# Patient Record
Sex: Male | Born: 1970 | Race: White | Hispanic: No | Smoking: Former smoker
Health system: Southern US, Community
[De-identification: ages and names within clinical notes are randomized; demographics above are authoritative.]

## PROBLEM LIST (undated history)

## (undated) DIAGNOSIS — D803 Selective deficiency of immunoglobulin G [IgG] subclasses: Secondary | ICD-10-CM

## (undated) DIAGNOSIS — I519 Heart disease, unspecified: Secondary | ICD-10-CM

## (undated) DIAGNOSIS — E785 Hyperlipidemia, unspecified: Secondary | ICD-10-CM

## (undated) DIAGNOSIS — F250 Schizoaffective disorder, bipolar type: Secondary | ICD-10-CM

## (undated) DIAGNOSIS — K2210 Ulcer of esophagus without bleeding: Secondary | ICD-10-CM

## (undated) DIAGNOSIS — R918 Other nonspecific abnormal finding of lung field: Secondary | ICD-10-CM

## (undated) DIAGNOSIS — K5792 Diverticulitis of intestine, part unspecified, without perforation or abscess without bleeding: Secondary | ICD-10-CM

## (undated) DIAGNOSIS — J449 Chronic obstructive pulmonary disease, unspecified: Secondary | ICD-10-CM

## (undated) DIAGNOSIS — R011 Cardiac murmur, unspecified: Secondary | ICD-10-CM

## (undated) DIAGNOSIS — F319 Bipolar disorder, unspecified: Secondary | ICD-10-CM

## (undated) DIAGNOSIS — I669 Occlusion and stenosis of unspecified cerebral artery: Secondary | ICD-10-CM

## (undated) DIAGNOSIS — H9190 Unspecified hearing loss, unspecified ear: Secondary | ICD-10-CM

## (undated) DIAGNOSIS — M1990 Unspecified osteoarthritis, unspecified site: Secondary | ICD-10-CM

## (undated) DIAGNOSIS — K219 Gastro-esophageal reflux disease without esophagitis: Secondary | ICD-10-CM

## (undated) DIAGNOSIS — R1110 Vomiting, unspecified: Secondary | ICD-10-CM

## (undated) DIAGNOSIS — I10 Essential (primary) hypertension: Secondary | ICD-10-CM

## (undated) DIAGNOSIS — K449 Diaphragmatic hernia without obstruction or gangrene: Secondary | ICD-10-CM

## (undated) DIAGNOSIS — Z8489 Family history of other specified conditions: Secondary | ICD-10-CM

## (undated) DIAGNOSIS — M549 Dorsalgia, unspecified: Secondary | ICD-10-CM

## (undated) DIAGNOSIS — D649 Anemia, unspecified: Secondary | ICD-10-CM

## (undated) HISTORY — DX: Gastro-esophageal reflux disease without esophagitis: K21.9

## (undated) HISTORY — DX: Essential (primary) hypertension: I10

## (undated) HISTORY — DX: Hyperlipidemia, unspecified: E78.5

## (undated) HISTORY — DX: Bipolar disorder, unspecified: F31.9

## (undated) HISTORY — DX: Heart disease, unspecified: I51.9

## (undated) HISTORY — DX: Anemia, unspecified: D64.9

## (undated) HISTORY — DX: Chronic obstructive pulmonary disease, unspecified: J44.9

## (undated) HISTORY — DX: Selective deficiency of immunoglobulin g (igg) subclasses: D80.3

## (undated) HISTORY — DX: Other nonspecific abnormal finding of lung field: R91.8

## (undated) HISTORY — DX: Schizoaffective disorder, bipolar type: F25.0

---

## 2003-11-09 HISTORY — PX: BRAIN SURGERY: SHX531

## 2013-08-07 ENCOUNTER — Encounter (HOSPITAL_COMMUNITY): Payer: Self-pay | Admitting: *Deleted

## 2013-08-07 ENCOUNTER — Emergency Department (HOSPITAL_COMMUNITY)
Admission: EM | Admit: 2013-08-07 | Discharge: 2013-08-07 | Disposition: A | Discharge status: Home / Self Care | Payer: Self-pay | Attending: Emergency Medicine | Admitting: Emergency Medicine

## 2013-08-07 ENCOUNTER — Emergency Department (HOSPITAL_COMMUNITY): Payer: Self-pay

## 2013-08-07 DIAGNOSIS — IMO0001 Reserved for inherently not codable concepts without codable children: Secondary | ICD-10-CM | POA: Insufficient documentation

## 2013-08-07 DIAGNOSIS — M542 Cervicalgia: Secondary | ICD-10-CM | POA: Insufficient documentation

## 2013-08-07 DIAGNOSIS — R202 Paresthesia of skin: Secondary | ICD-10-CM

## 2013-08-07 DIAGNOSIS — Z79899 Other long term (current) drug therapy: Secondary | ICD-10-CM | POA: Insufficient documentation

## 2013-08-07 DIAGNOSIS — R209 Unspecified disturbances of skin sensation: Secondary | ICD-10-CM | POA: Insufficient documentation

## 2013-08-07 DIAGNOSIS — R1110 Vomiting, unspecified: Secondary | ICD-10-CM | POA: Insufficient documentation

## 2013-08-07 DIAGNOSIS — Z8739 Personal history of other diseases of the musculoskeletal system and connective tissue: Secondary | ICD-10-CM | POA: Insufficient documentation

## 2013-08-07 HISTORY — DX: Unspecified osteoarthritis, unspecified site: M19.90

## 2013-08-07 LAB — CBC WITH DIFFERENTIAL/PLATELET
Basophils Relative: 1 % (ref 0–1)
HCT: 40.7 % (ref 39.0–52.0)
Hemoglobin: 13.5 g/dL (ref 13.0–17.0)
Lymphs Abs: 1.7 10*3/uL (ref 0.7–4.0)
MCH: 30 pg (ref 26.0–34.0)
MCHC: 33.2 g/dL (ref 30.0–36.0)
MCV: 90.4 fL (ref 78.0–100.0)
Monocytes Absolute: 0.5 10*3/uL (ref 0.1–1.0)
Monocytes Relative: 7 % (ref 3–12)
Neutro Abs: 5.1 10*3/uL (ref 1.7–7.7)

## 2013-08-07 LAB — BASIC METABOLIC PANEL
BUN: 13 mg/dL (ref 6–23)
Calcium: 9.7 mg/dL (ref 8.4–10.5)
Chloride: 100 mEq/L (ref 96–112)
Creatinine, Ser: 0.95 mg/dL (ref 0.50–1.35)
GFR calc Af Amer: >90 mL/min (ref >90)
Sodium: 137 mEq/L (ref 135–145)

## 2013-08-07 LAB — TROPONIN I: Troponin I: <0.3 ng/mL (ref <0.30)

## 2013-08-07 MED ORDER — OXYCODONE-ACETAMINOPHEN 5-325 MG PO TABS
1.0000 | ORAL_TABLET | Freq: Once | ORAL | Status: AC
  Administered 2013-08-07: 1 via ORAL
  Filled 2013-08-07: qty 1

## 2013-08-07 MED ORDER — HYDROCODONE-ACETAMINOPHEN 5-325 MG PO TABS: 1.0000 | ORAL_TABLET | ORAL | Status: DC | PRN

## 2013-08-07 MED ORDER — ESOMEPRAZOLE MAGNESIUM 40 MG PO CPDR: 40.0000 mg | DELAYED_RELEASE_CAPSULE | Freq: Every day | ORAL | Status: DC

## 2013-08-07 NOTE — ED Notes (Signed)
Pain lt arm tingling , pain.  X 4 days.arthritis in neck.  No known injury

## 2013-08-07 NOTE — ED Provider Notes (Signed)
CSN: [id number]     Arrival date & time 08/07/13  1247 History   This chart was scribed for Joya Gaskins, MD, by Yevette Edwards, ED Scribe. This patient was seen in room APA07/APA07 and the patient's care was started at 2:54 PM.  First MD Initiated Contact with Patient 08/07/13 1435     Chief Complaint  Patient presents with  . Arm Pain    The history is provided by the patient. No language interpreter was used.   HPI Comments: Shawn [name] is a 42 y.o. male, with a h/o arthritis, who presents to the Emergency Department complaining of left arm pain which radiates down his arm into two fingers and is associated with numbness and paresthesia. This started about 4 days ago.  No trauma reported  He also denies any decreased strength to his hand or fingers and he denies any cyanosis of fingers.  The pt has a h/o neck pain, and he reports gradually-increasing neck pain. He denies any numbness to his legs. He denies any chest pain or SOB. The pt also reports that he has experienced intermittent episodes of emesis for at least a year.   Past Medical History  Diagnosis Date  . Arthritis    History reviewed. No pertinent past surgical history. History reviewed. No pertinent family history. History  Substance Use Topics  . Smoking status: Never Smoker   . Smokeless tobacco: Current User    Types: Snuff  . Alcohol Use: Yes    Review of Systems  HENT: Positive for neck pain (Increased from baseline. ).   Respiratory: Negative for shortness of breath.   Cardiovascular: Negative for chest pain.  Gastrointestinal: Positive for vomiting.  Musculoskeletal: Positive for myalgias.  Neurological: Positive for numbness. Negative for weakness.  All other systems reviewed and are negative.    Allergies  Review of patient's allergies indicates no known allergies.  Home Medications   Current Outpatient Rx  Name  Route  Sig  Dispense  Refill  . carbamazepine (TEGRETOL) 200 MG tablet    Oral   Take 200 mg by mouth 2 (two) times daily.         . citalopram (CELEXA) 20 MG tablet   Oral   Take 20 mg by mouth daily.         Marland Kitchen esomeprazole (NEXIUM) 40 MG capsule   Oral   Take 40 mg by mouth daily before breakfast.         . HYDROcodone-acetaminophen (NORCO) 7.5-325 MG per tablet   Oral   Take 1 tablet by mouth 2 (two) times daily as needed for pain.         Marland Kitchen lurasidone (LATUDA) 40 MG TABS tablet   Oral   Take 40 mg by mouth 2 (two) times daily.         . Multiple Vitamins-Minerals (MULTIVITAMINS THER. W/MINERALS) TABS tablet   Oral   Take 1 tablet by mouth daily.         Marland Kitchen RASPBERRY KETONES PO   Oral   Take 1 capsule by mouth 2 (two) times daily.         Marland Kitchen GABAPENTIN PO   Oral   Take 1 capsule by mouth at bedtime as needed (sleep/ muscle relaxation).          Triage Vitals: BP 138/93  Pulse 80  Temp(Src) 98.4 F (36.9 C) (Oral)  Resp 18  Ht 6' (1.829 m)  Wt 190 lb (86.183 kg)  BMI 25.76 kg/m2  SpO2 100%  Physical Exam CONSTITUTIONAL: Well developed/well nourished HEAD: Normocephalic/atraumatic EYES: EOMI/PERRL ENMT: Mucous membranes moist NECK: supple no meningeal signs SPINE:entire spine non-tender; mild cervical para-spinal tenderness.  CV: S1/S2 noted, no murmurs/rubs/gallops noted LUNGS: Lungs are clear to auscultation bilaterally, no apparent distress ABDOMEN: soft, nontender, no rebound or guarding GU:no cva tenderness NEURO: Pt is awake/alert, moves all extremitiesx4 ;no arm drift; no facial droop, Equal power with hand grip, wrist flex/extension, elbow flex/extension, and equal power with shoulder abduction/adduction.  No focal sensory deficit is noted in either UE.   Equal biceps/brachioradial reflex in bilateral UE EXTREMITIES: pulses normal, full ROM SKIN: warm, color normal PSYCH: no abnormalities of mood noted  ED Course  Procedures   DIAGNOSTIC STUDIES: Oxygen Saturation is 100% on room air, normal by my  interpretation.    COORDINATION OF CARE:  3:00 PM- Discussed treatment plan with patient, and the patient agreed to the plan.  Doubt ACS Doubt CVA Suspect possible cervical radiculopathy He is new to area, short course of pain meds (and he requested nexium refill) ordered for patient Stable for d/c home  Labs Review Labs Reviewed  CBC WITH DIFFERENTIAL  BASIC METABOLIC PANEL  TROPONIN I      Imaging Review Dg Chest 2 View  08/07/2013   CLINICAL DATA:  Left arm pain/numbness  EXAM: CHEST  2 VIEW  COMPARISON:  None.  FINDINGS: Lungs are clear. No pleural effusion or pneumothorax.  The heart is normal in size.  Mild degenerative changes of the mid thoracic spine.  IMPRESSION: No evidence of acute cardiopulmonary disease.   Electronically Signed   By: Charline Bills M.D.   On: 08/07/2013 13:25    MDM  No diagnosis found. Nursing notes including past medical history and social history reviewed and considered in documentation xrays reviewed and considered Labs/vital reviewed and considered     Date: 08/07/2013  Rate: 74  Rhythm: normal sinus rhythm  QRS Axis: normal  Intervals: normal  ST/T Wave abnormalities: nonspecific ST changes  Conduction Disutrbances:none  Narrative Interpretation: LVH noted  Old EKG Reviewed: none available at time of interpretation    I personally performed the services described in this documentation, which was scribed in my presence. The recorded information has been reviewed and is accurate.      Joya Gaskins, MD 08/07/13 2221

## 2013-08-17 ENCOUNTER — Encounter (HOSPITAL_COMMUNITY): Payer: Self-pay | Admitting: Emergency Medicine

## 2013-08-17 ENCOUNTER — Emergency Department (HOSPITAL_COMMUNITY)
Admission: EM | Admit: 2013-08-17 | Discharge: 2013-08-17 | Disposition: A | Discharge status: Home / Self Care | Payer: Self-pay | Attending: Emergency Medicine | Admitting: Emergency Medicine

## 2013-08-17 DIAGNOSIS — Z4802 Encounter for removal of sutures: Secondary | ICD-10-CM

## 2013-08-17 DIAGNOSIS — Z8739 Personal history of other diseases of the musculoskeletal system and connective tissue: Secondary | ICD-10-CM | POA: Insufficient documentation

## 2013-08-17 DIAGNOSIS — Z79899 Other long term (current) drug therapy: Secondary | ICD-10-CM | POA: Insufficient documentation

## 2013-08-17 DIAGNOSIS — Z6832 Body mass index (BMI) 32.0-32.9, adult: Secondary | ICD-10-CM | POA: Insufficient documentation

## 2013-08-17 NOTE — ED Notes (Signed)
Alert, NAD here for suture removal.

## 2013-08-17 NOTE — ED Notes (Signed)
Pt had sutures placed to groin area 2 weeks ago, removed several himself, unable to get them all out.  Caught his penis on toilet seat 2 weeks ago.

## 2013-08-17 NOTE — ED Provider Notes (Signed)
CSN: [id number]     Arrival date & time 08/17/13  1411 History   First MD Initiated Contact with Patient 08/17/13 1427     Chief Complaint  Patient presents with  . Suture / Staple Removal   (Consider location/radiation/quality/duration/timing/severity/associated sxs/prior Treatment) HPI Comments: Shawn [name] is a 42 y.o. Male presenting for suture removal.  He had a laceration injury to the base of his penis 10 days ago (in [place] where he used to live).  He was sitting on his toilet when the seat slid, causing injury and laceration.  He reports having 6 stitches originally, was able to remove 4 himself,  But almost passed out trying to remove the remaining 2 today.  He denies pain at the site,  No drainage, redness or other complaint.     The history is provided by the patient and the spouse.    Past Medical History  Diagnosis Date  . Arthritis    History reviewed. No pertinent past surgical history. No family history on file. History  Substance Use Topics  . Smoking status: Never Smoker   . Smokeless tobacco: Current User    Types: Snuff  . Alcohol Use: Yes    Review of Systems  Constitutional: Negative for fever and chills.  HENT: Negative for facial swelling.   Respiratory: Negative for shortness of breath and wheezing.   Skin: Positive for wound. Negative for color change.  Neurological: Negative for numbness.    Allergies  Review of patient's allergies indicates no known allergies.  Home Medications   Current Outpatient Rx  Name  Route  Sig  Dispense  Refill  . carbamazepine (TEGRETOL) 200 MG tablet   Oral   Take 200 mg by mouth 2 (two) times daily.         . citalopram (CELEXA) 20 MG tablet   Oral   Take 20 mg by mouth daily.         Marland Kitchen gabapentin (NEURONTIN) 400 MG capsule   Oral   Take 400 mg by mouth at bedtime.         Marland Kitchen lurasidone (LATUDA) 40 MG TABS tablet   Oral   Take 40 mg by mouth 2 (two) times daily.         . Multiple  Vitamins-Minerals (MULTIVITAMINS THER. W/MINERALS) TABS tablet   Oral   Take 1 tablet by mouth daily.         Marland Kitchen RASPBERRY KETONES PO   Oral   Take 1 capsule by mouth 2 (two) times daily.          BP 127/84  Pulse 89  Temp(Src) 98.8 F (37.1 C) (Oral)  Resp 18  Ht 6' (1.829 m)  Wt 187 lb (84.823 kg)  BMI 25.36 kg/m2  SpO2 99% Physical Exam  Constitutional: He is oriented to person, place, and time. He appears well-developed and well-nourished.  HENT:  Head: Normocephalic.  Cardiovascular: Normal rate.   Pulmonary/Chest: Effort normal.  Musculoskeletal: He exhibits no edema and no tenderness.  Neurological: He is alert and oriented to person, place, and time. No sensory deficit.  Skin: Laceration noted.  Well healed laceration dorsal base of penis,  2 sutures in place.  No drainage,  Redness, no swelling.    ED Course  Procedures (including critical care time)  Procedure - suture removal - #2 simple interrupted sutures removed which patient tolerated well.   Labs Review Labs Reviewed - No data to display Imaging Review No results found.  EKG Interpretation  None       MDM   1. Visit for suture removal    Prn f/u anticipated    Burgess Amor, PA-C 08/17/13 1455

## 2013-08-17 NOTE — ED Provider Notes (Signed)
Medical screening examination/treatment/procedure(s) were performed by non-physician practitioner and as supervising physician I was immediately available for consultation/collaboration.   Celene Kras, MD 08/17/13 [phone]

## 2013-09-29 ENCOUNTER — Emergency Department (HOSPITAL_COMMUNITY)
Admission: EM | Admit: 2013-09-29 | Discharge: 2013-09-29 | Disposition: A | Discharge status: Home / Self Care | Payer: Self-pay | Attending: Emergency Medicine | Admitting: Emergency Medicine

## 2013-09-29 ENCOUNTER — Emergency Department (HOSPITAL_COMMUNITY): Payer: Self-pay

## 2013-09-29 ENCOUNTER — Encounter (HOSPITAL_COMMUNITY): Payer: Self-pay | Admitting: Emergency Medicine

## 2013-09-29 DIAGNOSIS — F319 Bipolar disorder, unspecified: Secondary | ICD-10-CM | POA: Insufficient documentation

## 2013-09-29 DIAGNOSIS — R42 Dizziness and giddiness: Secondary | ICD-10-CM | POA: Insufficient documentation

## 2013-09-29 DIAGNOSIS — M129 Arthropathy, unspecified: Secondary | ICD-10-CM | POA: Insufficient documentation

## 2013-09-29 DIAGNOSIS — R21 Rash and other nonspecific skin eruption: Secondary | ICD-10-CM | POA: Insufficient documentation

## 2013-09-29 DIAGNOSIS — Z79899 Other long term (current) drug therapy: Secondary | ICD-10-CM | POA: Insufficient documentation

## 2013-09-29 DIAGNOSIS — M549 Dorsalgia, unspecified: Secondary | ICD-10-CM | POA: Insufficient documentation

## 2013-09-29 DIAGNOSIS — G8929 Other chronic pain: Secondary | ICD-10-CM | POA: Insufficient documentation

## 2013-09-29 DIAGNOSIS — M542 Cervicalgia: Secondary | ICD-10-CM | POA: Insufficient documentation

## 2013-09-29 DIAGNOSIS — K5732 Diverticulitis of large intestine without perforation or abscess without bleeding: Secondary | ICD-10-CM | POA: Insufficient documentation

## 2013-09-29 DIAGNOSIS — K5792 Diverticulitis of intestine, part unspecified, without perforation or abscess without bleeding: Secondary | ICD-10-CM

## 2013-09-29 DIAGNOSIS — H538 Other visual disturbances: Secondary | ICD-10-CM | POA: Insufficient documentation

## 2013-09-29 HISTORY — DX: Dorsalgia, unspecified: M54.9

## 2013-09-29 HISTORY — DX: Bipolar disorder, unspecified: F31.9

## 2013-09-29 LAB — COMPREHENSIVE METABOLIC PANEL
ALT: 24 U/L (ref 0–53)
Alkaline Phosphatase: 75 U/L (ref 39–117)
CO2: 27 mEq/L (ref 19–32)
Chloride: 97 mEq/L (ref 96–112)
Creatinine, Ser: 1.12 mg/dL (ref 0.50–1.35)
GFR calc Af Amer: >90 mL/min (ref >90)
GFR calc non Af Amer: 79 mL/min — ABNORMAL LOW (ref >90)
Glucose, Bld: 103 mg/dL — ABNORMAL HIGH (ref 70–99)
Potassium: 3.9 mEq/L (ref 3.5–5.1)
Sodium: 134 mEq/L — ABNORMAL LOW (ref 135–145)

## 2013-09-29 LAB — URINALYSIS, ROUTINE W REFLEX MICROSCOPIC
Hgb urine dipstick: NEGATIVE
Nitrite: NEGATIVE
Protein, ur: NEGATIVE mg/dL
Specific Gravity, Urine: >1.03 — ABNORMAL HIGH (ref 1.005–1.030)
Urobilinogen, UA: 0.2 mg/dL (ref 0.0–1.0)
pH: 6 (ref 5.0–8.0)

## 2013-09-29 LAB — CBC WITH DIFFERENTIAL/PLATELET
Basophils Absolute: 0.1 10*3/uL (ref 0.0–0.1)
Basophils Relative: 0 % (ref 0–1)
Eosinophils Absolute: 0.1 10*3/uL (ref 0.0–0.7)
Hemoglobin: 14.6 g/dL (ref 13.0–17.0)
MCHC: 33.7 g/dL (ref 30.0–36.0)
MCV: 89.1 fL (ref 78.0–100.0)
Monocytes Absolute: 0.8 10*3/uL (ref 0.1–1.0)
Neutro Abs: 9.8 10*3/uL — ABNORMAL HIGH (ref 1.7–7.7)
Neutrophils Relative %: 81 % — ABNORMAL HIGH (ref 43–77)
RDW: 13.7 % (ref 11.5–15.5)
WBC: 12.1 10*3/uL — ABNORMAL HIGH (ref 4.0–10.5)

## 2013-09-29 MED ORDER — SODIUM CHLORIDE 0.9 % IV SOLN
3.0000 g | Freq: Once | INTRAVENOUS | Status: AC
  Administered 2013-09-29: 3 g via INTRAVENOUS
  Filled 2013-09-29: qty 3

## 2013-09-29 MED ORDER — SODIUM CHLORIDE 0.9 % IV SOLN: INTRAVENOUS | Status: DC

## 2013-09-29 MED ORDER — HYDROMORPHONE HCL PF 1 MG/ML IJ SOLN
1.0000 mg | Freq: Once | INTRAMUSCULAR | Status: AC
  Administered 2013-09-29: 1 mg via INTRAVENOUS
  Filled 2013-09-29: qty 1

## 2013-09-29 MED ORDER — IOHEXOL 300 MG/ML  SOLN
100.0000 mL | Freq: Once | INTRAMUSCULAR | Status: AC | PRN
  Administered 2013-09-29: 100 mL via INTRAVENOUS

## 2013-09-29 MED ORDER — AMOXICILLIN-POT CLAVULANATE 875-125 MG PO TABS: 1.0000 | ORAL_TABLET | Freq: Two times a day (BID) | ORAL | Status: DC

## 2013-09-29 MED ORDER — ONDANSETRON HCL 4 MG/2ML IJ SOLN
4.0000 mg | Freq: Once | INTRAMUSCULAR | Status: AC
  Administered 2013-09-29: 4 mg via INTRAVENOUS
  Filled 2013-09-29: qty 2

## 2013-09-29 MED ORDER — PROMETHAZINE HCL 25 MG PO TABS: 25.0000 mg | ORAL_TABLET | Freq: Four times a day (QID) | ORAL | Status: DC | PRN

## 2013-09-29 MED ORDER — SODIUM CHLORIDE 0.9 % IV BOLUS (SEPSIS)
500.0000 mL | Freq: Once | INTRAVENOUS | Status: AC
  Administered 2013-09-29: 500 mL via INTRAVENOUS

## 2013-09-29 MED ORDER — HYDROCODONE-ACETAMINOPHEN 5-325 MG PO TABS: 1.0000 | ORAL_TABLET | Freq: Four times a day (QID) | ORAL | Status: DC | PRN

## 2013-09-29 MED ORDER — IOHEXOL 300 MG/ML  SOLN
50.0000 mL | Freq: Once | INTRAMUSCULAR | Status: AC | PRN
  Administered 2013-09-29: 50 mL via ORAL

## 2013-09-29 NOTE — ED Notes (Signed)
Pt with dizziness while in shower today and has continued, pt also with abd pain, denies N/V/D today but family member states that he has vomiting a lot

## 2013-09-29 NOTE — ED Provider Notes (Signed)
CSN: [id number]     Arrival date & time 09/29/13  1318 History  This chart was scribed for Shelda Jakes, MD,  by Ashley Jacobs, ED Scribe. The patient was seen in room APA11/APA11 and the patient's care was started at 1:43 PM. First MD Initiated Contact with Patient 09/29/13 1333     Chief Complaint  Patient presents with  . Dizziness  . Abdominal Pain   (Consider location/radiation/quality/duration/timing/severity/associated sxs/prior Treatment) The history is provided by the patient and medical records. No language interpreter was used.   HPI Comments: Shawn [name] is a 42 y.o. male who presents to the Emergency Department complaining of dizziness while in the shower today. Pt also complains of constant, moderate LLQ pain for months but the pain is worse today. The pain is sharp, 7/10 in severity, and radiates to his left lateral abdomen. Pt reports emesis once yesterday before dinner but reports this is fairly baseline. He also states having blurred vision, nausea, chronic back pain and neck pain, lightheadedness and dizziness. He denies fever and diarrhea. Pt denies having any allergies to medication. He has a medical hx of arthritis, back pain, and bipolar 1 disorders. Pt does not smoke and drinks alcohol occasionally. Nothing seems to relieve his symptoms.    [facility]  Past Medical History  Diagnosis Date  . Arthritis   . Back pain   . Bipolar 1 disorder    Past Surgical History  Procedure Laterality Date  . Brain surgery     History reviewed. No pertinent family history. History  Substance Use Topics  . Smoking status: Never Smoker   . Smokeless tobacco: Current User    Types: Snuff  . Alcohol Use: Yes     Comment: "once in awhile"    Review of Systems  Constitutional: Negative for fever and chills.  HENT: Negative for congestion, rhinorrhea and sore throat.   Eyes: Positive for visual disturbance.  Respiratory:  Negative for choking and shortness of breath.   Cardiovascular: Negative for chest pain and leg swelling.  Gastrointestinal: Positive for nausea, vomiting and abdominal pain. Negative for diarrhea and constipation.  Musculoskeletal: Positive for back pain (assoicated with arthritis) and neck pain (associated with arthristis).  Skin: Positive for rash.  Neurological: Positive for dizziness and light-headedness. Negative for headaches.  Hematological: Does not bruise/bleed easily.  Psychiatric/Behavioral: Negative for confusion.  All other systems reviewed and are negative.    Allergies  Review of patient's allergies indicates no known allergies.  Home Medications   Current Outpatient Rx  Name  Route  Sig  Dispense  Refill  . citalopram (CELEXA) 20 MG tablet   Oral   Take 20 mg by mouth daily.         Marland Kitchen esomeprazole (NEXIUM) 40 MG capsule   Oral   Take 40 mg by mouth daily at 12 noon.         . gabapentin (NEURONTIN) 400 MG capsule   Oral   Take 400-800 mg by mouth 2 (two) times daily.          . hydrOXYzine (ATARAX/VISTARIL) 50 MG tablet   Oral   Take 50 mg by mouth 3 (three) times daily.         . Multiple Vitamins-Minerals (MULTIVITAMINS THER. W/MINERALS) TABS tablet   Oral   Take 1 tablet by mouth daily.         . traZODone (DESYREL) 150 MG tablet   Oral  Take 300 mg by mouth at bedtime.         Marland Kitchen amoxicillin-clavulanate (AUGMENTIN) 875-125 MG per tablet   Oral   Take 1 tablet by mouth every 12 (twelve) hours.   20 tablet   0   . carbamazepine (TEGRETOL) 200 MG tablet   Oral   Take 200 mg by mouth 2 (two) times daily.         Marland Kitchen HYDROcodone-acetaminophen (NORCO/VICODIN) 5-325 MG per tablet   Oral   Take 1-2 tablets by mouth every 6 (six) hours as needed for moderate pain.   20 tablet   0   . promethazine (PHENERGAN) 25 MG tablet   Oral   Take 1 tablet (25 mg total) by mouth every 6 (six) hours as needed for nausea or vomiting.   12  tablet   0    BP 120/77  Pulse 122  Temp(Src) 98.2 F (36.8 C) (Oral)  Resp 19  Ht 6' (1.829 m)  Wt 185 lb (83.915 kg)  BMI 25.08 kg/m2  SpO2 96% Physical Exam  Nursing note and vitals reviewed. Constitutional: He is oriented to person, place, and time. He appears well-developed and well-nourished. No distress.  HENT:  Head: Normocephalic.  Eyes: Conjunctivae are normal. Pupils are equal, round, and reactive to light. Right eye exhibits no discharge. Left eye exhibits no discharge. No scleral icterus.  Neck: Normal range of motion. Neck supple.  Cardiovascular: Normal rate, regular rhythm, normal heart sounds and intact distal pulses.  Exam reveals no gallop and no friction rub.   No murmur heard. Pulmonary/Chest: Effort normal and breath sounds normal. He has no wheezes. He has no rales.  Abdominal: Soft. Bowel sounds are normal. He exhibits no distension. There is tenderness (LLQ). There is no rebound and no guarding.  Musculoskeletal: Normal range of motion. He exhibits no edema and no tenderness.  Lymphadenopathy:    He has no cervical adenopathy.  Neurological: He is alert and oriented to person, place, and time. No cranial nerve deficit. He exhibits normal muscle tone. Coordination normal.  Skin: Skin is warm. He is not diaphoretic.  Psychiatric: He has a normal mood and affect. His behavior is normal.    ED Course  Procedures (including critical care time) DIAGNOSTIC STUDIES: Oxygen Saturation is 96% on room air, normal by my interpretation.    COORDINATION OF CARE: 1:48 PM Discussed course of care with pt which includes 0.9 % sodium chloride infusion/500 mL, Zofran injection 4 mg, Dilaudid injection 1 mg, Omnipaque 300 mg/mL, and laboratory tests. Pt understands and agrees.  Labs Review Labs Reviewed  URINALYSIS, ROUTINE W REFLEX MICROSCOPIC - Abnormal; Notable for the following:    Color, Urine AMBER (*)    Specific Gravity, Urine >1.030 (*)    Bilirubin Urine  SMALL (*)    Ketones, ur TRACE (*)    All other components within normal limits  COMPREHENSIVE METABOLIC PANEL - Abnormal; Notable for the following:    Sodium 134 (*)    Glucose, Bld 103 (*)    Total Protein 8.8 (*)    GFR calc non Af Amer 79 (*)    All other components within normal limits  CBC WITH DIFFERENTIAL - Abnormal; Notable for the following:    WBC 12.1 (*)    Neutrophils Relative % 81 (*)    Neutro Abs 9.8 (*)    Lymphocytes Relative 11 (*)    All other components within normal limits  LIPASE, BLOOD   Results for orders  placed during the hospital encounter of 09/29/13  URINALYSIS, ROUTINE W REFLEX MICROSCOPIC      Result Value Range   Color, Urine AMBER (*) YELLOW   APPearance CLEAR  CLEAR   Specific Gravity, Urine >1.030 (*) 1.005 - 1.030   pH 6.0  5.0 - 8.0   Glucose, UA NEGATIVE  NEGATIVE mg/dL   Hgb urine dipstick NEGATIVE  NEGATIVE   Bilirubin Urine SMALL (*) NEGATIVE   Ketones, ur TRACE (*) NEGATIVE mg/dL   Protein, ur NEGATIVE  NEGATIVE mg/dL   Urobilinogen, UA 0.2  0.0 - 1.0 mg/dL   Nitrite NEGATIVE  NEGATIVE   Leukocytes, UA NEGATIVE  NEGATIVE  LIPASE, BLOOD      Result Value Range   Lipase 33  11 - 59 U/L  COMPREHENSIVE METABOLIC PANEL      Result Value Range   Sodium 134 (*) 135 - 145 mEq/L   Potassium 3.9  3.5 - 5.1 mEq/L   Chloride 97  96 - 112 mEq/L   CO2 27  19 - 32 mEq/L   Glucose, Bld 103 (*) 70 - 99 mg/dL   BUN 17  6 - 23 mg/dL   Creatinine, Ser 3.08  0.50 - 1.35 mg/dL   Calcium 65.7  8.4 - 84.6 mg/dL   Total Protein 8.8 (*) 6.0 - 8.3 g/dL   Albumin 4.0  3.5 - 5.2 g/dL   AST 23  0 - 37 U/L   ALT 24  0 - 53 U/L   Alkaline Phosphatase 75  39 - 117 U/L   Total Bilirubin 0.4  0.3 - 1.2 mg/dL   GFR calc non Af Amer 79 (*) >90 mL/min   GFR calc Af Amer >90  >90 mL/min  CBC WITH DIFFERENTIAL      Result Value Range   WBC 12.1 (*) 4.0 - 10.5 K/uL   RBC 4.86  4.22 - 5.81 MIL/uL   Hemoglobin 14.6  13.0 - 17.0 g/dL   HCT 96.2  95.2 - 84.1 %    MCV 89.1  78.0 - 100.0 fL   MCH 30.0  26.0 - 34.0 pg   MCHC 33.7  30.0 - 36.0 g/dL   RDW 32.4  40.1 - 02.7 %   Platelets 250  150 - 400 K/uL   Neutrophils Relative % 81 (*) 43 - 77 %   Neutro Abs 9.8 (*) 1.7 - 7.7 K/uL   Lymphocytes Relative 11 (*) 12 - 46 %   Lymphs Abs 1.4  0.7 - 4.0 K/uL   Monocytes Relative 7  3 - 12 %   Monocytes Absolute 0.8  0.1 - 1.0 K/uL   Eosinophils Relative 1  0 - 5 %   Eosinophils Absolute 0.1  0.0 - 0.7 K/uL   Basophils Relative 0  0 - 1 %   Basophils Absolute 0.1  0.0 - 0.1 K/uL    Imaging Review Ct Abdomen Pelvis W Contrast  09/29/2013   CLINICAL DATA:  Abdominal pain.  EXAM: CT ABDOMEN AND PELVIS WITH CONTRAST  TECHNIQUE: Multidetector CT imaging of the abdomen and pelvis was performed using the standard protocol following bolus administration of intravenous contrast.  CONTRAST:  50mL OMNIPAQUE IOHEXOL 300 MG/ML SOLN, OMNIPAQUE IOHEXOL 300 MG/ML SOLN  COMPARISON:  None.  FINDINGS: Visualized lung bases appear normal. The liver, spleen and pancreas appear normal. No gallstones are noted. Adrenal glands and kidneys appear normal no hydronephrosis or renal obstruction is noted. No renal or ureteral calculi are noted. The  appendix appears normal. No evidence of bowel obstruction is noted. Stool is noted in the right and transverse colon. Diverticulitis of proximal sigmoid colon is noted. No abnormal fluid collection or abscess is noted. Urinary bladder appears normal. No osseous abnormality  IMPRESSION: Focal sigmoid diverticulitis is noted. No definite abscess is noted.   Electronically Signed   By: Roque Lias M.D.   On: 09/29/2013 15:20    EKG Interpretation   None       MDM   1. Diverticulitis    CT scan consistent with sigmoid diverticulitis. No complicating factors. Mild leukocytosis patient treated with Unasyn in the emergency department 3 g we sent home with Augmentin patient will need a followup colonoscopy referral to GI medicine  provided. Patient understands he should be improved in 2-3 days if not he will return.    I personally performed the services described in this documentation, which was scribed in my presence. The recorded information has been reviewed and is accurate.      Shelda Jakes, MD 09/29/13 [phone]

## 2013-10-01 ENCOUNTER — Ambulatory Visit (INDEPENDENT_AMBULATORY_CARE_PROVIDER_SITE_OTHER): Payer: Self-pay | Admitting: Gastroenterology

## 2013-10-01 ENCOUNTER — Encounter (INDEPENDENT_AMBULATORY_CARE_PROVIDER_SITE_OTHER): Payer: Self-pay

## 2013-10-01 ENCOUNTER — Encounter: Payer: Self-pay | Admitting: Gastroenterology

## 2013-10-01 VITALS — BP 123/81 | HR 90 | Temp 98.2°F | Wt 191.8 lb

## 2013-10-01 DIAGNOSIS — K219 Gastro-esophageal reflux disease without esophagitis: Secondary | ICD-10-CM

## 2013-10-01 DIAGNOSIS — K5732 Diverticulitis of large intestine without perforation or abscess without bleeding: Secondary | ICD-10-CM | POA: Insufficient documentation

## 2013-10-01 MED ORDER — DEXLANSOPRAZOLE 60 MG PO CPDR: 60.0000 mg | DELAYED_RELEASE_CAPSULE | Freq: Every day | ORAL | Status: DC

## 2013-10-01 NOTE — Patient Instructions (Signed)
Stop Nexium for now. Start taking the samples of Dexilant once each morning. This is for reflux. Please fill out the forms so you can continue Dexilant if this does well for you.  Follow a low-residue/low-fiber diet for the next 2 weeks. Then, you will advance to a high fiber diet. I have included a handout.  We will see you back in about 4 weeks to set up a colonoscopy and upper endoscopy. Please call if you have any worsening of symptoms in the meantime!  Diverticulitis A diverticulum is a small pouch or sac on the colon. Diverticulosis is the presence of these diverticula on the colon. Diverticulitis is the irritation (inflammation) or infection of diverticula. CAUSES  The colon and its diverticula contain bacteria. If food particles block the tiny opening to a diverticulum, the bacteria inside can grow and cause an increase in pressure. This leads to infection and inflammation and is called diverticulitis. SYMPTOMS   Abdominal pain and tenderness. Usually, the pain is located on the left side of your abdomen. However, it could be located elsewhere.  Fever.  Bloating.  Feeling sick to your stomach (nausea).  Throwing up (vomiting).  Abnormal stools. DIAGNOSIS  Your caregiver will take a history and perform a physical exam. Since many things can cause abdominal pain, other tests may be necessary. Tests may include:  Blood tests.  Urine tests.  X-ray of the abdomen.  CT scan of the abdomen. Sometimes, surgery is needed to determine if diverticulitis or other conditions are causing your symptoms. TREATMENT  Most of the time, you can be treated without surgery. Treatment includes:  Resting the bowels by only having liquids for a few days. As you improve, you will need to eat a low-fiber diet.  Intravenous (IV) fluids if you are losing body fluids (dehydrated).  Antibiotic medicines that treat infections may be given.  Pain and nausea medicine, if needed.  Surgery if the  inflamed diverticulum has burst. HOME CARE INSTRUCTIONS   Try a clear liquid diet (broth, tea, or water for as long as directed by your caregiver). You may then gradually begin a low-fiber diet as tolerated.  A low-fiber diet is a diet with less than 10 grams of fiber. Choose the foods below to reduce fiber in the diet:  White breads, cereals, rice, and pasta.  Cooked fruits and vegetables or soft fresh fruits and vegetables without the skin.  Ground or well-cooked tender beef, ham, veal, lamb, pork, or poultry.  Eggs and seafood.  After your diverticulitis symptoms have improved, your caregiver may put you on a high-fiber diet. A high-fiber diet includes 14 grams of fiber for every 1000 calories consumed. For a standard 2000 calorie diet, you would need 28 grams of fiber. Follow these diet guidelines to help you increase the fiber in your diet. It is important to slowly increase the amount fiber in your diet to avoid gas, constipation, and bloating.  Choose whole-grain breads, cereals, pasta, and brown rice.  Choose fresh fruits and vegetables with the skin on. Do not overcook vegetables because the more vegetables are cooked, the more fiber is lost.  Choose more nuts, seeds, legumes, dried peas, beans, and lentils.  Look for food products that have greater than 3 grams of fiber per serving on the Nutrition Facts label.  Take all medicine as directed by your caregiver.  If your caregiver has given you a follow-up appointment, it is very important that you go. Not going could result in lasting (chronic) or  permanent injury, pain, and disability. If there is any problem keeping the appointment, call to reschedule. SEEK MEDICAL CARE IF:   Your pain does not improve.  You have a hard time advancing your diet beyond clear liquids.  Your bowel movements do not return to normal. SEEK IMMEDIATE MEDICAL CARE IF:   Your pain becomes worse.  You have an oral temperature above 102 F  (38.9 C), not controlled by medicine.  You have repeated vomiting.  You have bloody or black, tarry stools.  Symptoms that brought you to your caregiver become worse or are not getting better. MAKE SURE YOU:   Understand these instructions.  Will watch your condition.  Will get help right away if you are not doing well or get worse. Document Released: 08/04/2005 Document Revised: 01/17/2012 Document Reviewed: 11/30/2010 [facility] Patient Information 2014 [place], [place]. Low-Fiber Diet Fiber is found in fruits, vegetables, and grains. A low-fiber diet restricts fibrous foods that are not digested in the small intestine. A diet containing about 10 grams of fiber is considered low fiber.  PURPOSE  To prevent blockage of a partially obstructed or narrowed gastrointestinal tract.  To reduce fecal weight and volume.  To slow the movement of feces. WHEN IS THIS DIET USED?  It may be used during the acute phase of Crohn disease, ulcerative colitis, regional enteritis, or diverticulitis.  It may be used if your intestinal or esophageal tubes are narrowing (stenosis).  It may be used as a transitional diet following surgery, injury (trauma), or illness. CHOOSING FOODS Check labels, especially on foods from the starch list. Often times, dietary fiber content is listed on the nutrition facts panel. Please ask your Registered Dietitian if you have questions about specific foods that are related to your condition, especially if the food is not listed on this handout. Breads and Starches  Allowed: White, [place], and pita breads, plain rolls, buns, or sweet rolls, doughnuts, waffles, pancakes, bagels. Plain muffins, biscuits, matzoth. Soda, saltine, graham crackers. Pretzels, rusks, melba toast, zwieback. Cooked cereals: cornmeal, farina, or cream cereals. Dry cereals: refined corn, wheat, rice, and oat cereals (check label). Potatoes prepared any way without skins, refined macaroni, spaghetti,  noodles, refined rice.  Avoid: Whole-wheat bread, rolls, and crackers. Multigrains, rye, bran seeds, nuts, or coconut. Cereals containing whole grains, multigrains, bran, coconut, nuts, raisins. Cooked or dry oatmeal. Coarse wheat cereals, granola. Cereals advertised as "high fiber." Potato skins. Whole-grain pasta, wild or brown rice. Popcorn. Vegetables  Allowed: Strained tomato and vegetable juices. Fresh lettuce, cucumber, spinach. Well-cooked or canned: asparagus, bean sprouts, broccoli, cut green beans, cauliflower, pumpkin, beets, mushrooms, yellow squash, tomato, tomato sauce, zucchini, turnips.Keep servings limited to  cup.  Avoid: Fresh, cooked, or canned: artichokes, baked beans, beet greens, Brussels sprouts, corn, kale, legumes, peas, sweet potatoes. Avoid large servings of any vegetables. Fruit  Allowed: All fruit juices except prune juice. Cooked or canned fruits without skin and seeds: apricots, applesauce, cantaloupe, cherries, grapefruit, grapes, kiwi, mandarin oranges, peaches, pears, fruit cocktail, pineapple, plums, watermelon. Fresh without skin: banana, grapes, cantaloupe, avocado, cherries, pineapple, kiwi, nectarines, peaches, blueberries. Keep servings limited to  cup or 1 piece.  Avoid: Fresh: apples with or without skin, apricots, mangoes, pears, raspberries, strawberries. Prune juice and juices with pulp, stewed or dried prunes. Dried fruits, raisins, dates. Avoid large servings of all fresh fruits. Meat and Protein Substitutes  Allowed: Ground or well-cooked tender beef, ham, veal, lamb, pork, poultry. Eggs, plain cheese. Fish, oysters, shrimp, lobster, other seafood. Liver,  organ meats. Smooth nut butters.  Avoid: Tough, fibrous meats with gristle. Chunky nut butter.Cheese with seeds, nuts, or other foods not allowed. Nuts, seeds, legumes, dried peas, beans, lentils. Dairy  Allowed: All milk products except those not allowed.  Avoid: Yogurt or cheese that  contains nuts, seeds, or added fruit. Soups and Combination Foods  Allowed: Bouillon, broth, or cream soups made from allowed foods. Any strained soup. Casseroles or mixed dishes made with allowed foods.  Avoid: Soups made from vegetables that are not allowed or that contain other foods not allowed. Desserts and Sweets  Allowed:Plain cakes and cookies, pie made with allowed fruit, pudding, custard, cream pie. Gelatin, fruit, ice, sherbet, frozen ice pops. Ice cream, ice milk without nuts. Plain hard candy, honey, jelly, molasses, syrup, sugar, chocolate syrup, gumdrops, marshmallows.  Avoid: Desserts, cookies, or candies that contain nuts, peanut butter, dried fruits. Jams, preserves with seeds, marmalade. Fats and Oils  Allowed:Margarine, butter, cream, mayonnaise, salad oils, plain salad dressings made from allowed foods.  Avoid: Seeds, nuts, olives. Beverages  Allowed: All, except those listed to avoid.  Avoid: Fruit juices with high pulp, prune juice. Condiments  Allowed:Ketchup, mustard, horseradish, vinegar, cream sauce, cheese sauce, cocoa powder. Spices in moderation: allspice, basil, bay leaves, celery powder or leaves, cinnamon, cumin powder, curry powder, ginger, mace, marjoram, onion or garlic powder, oregano, paprika, parsley flakes, ground pepper, rosemary, sage, savory, tarragon, thyme, turmeric.  Avoid: Coconut, pickles. SAMPLE MENU Breakfast   cup orange juice.  1 boiled egg.  1 slice white toast.  Margarine.   cup cornflakes.  1 cup milk.  Beverage. Lunch   cup chicken noodle soup.  2 to 3 oz sliced roast beef.  2 slices white bread.  Mayonnaise.   cup tomato juice.  1 small banana.  Beverage. Dinner  3 oz baked chicken.   cup scalloped potatoes.   cup cooked beets.  White dinner roll.  Margarine.   cup canned peaches.  Beverage. Document Released: 04/16/2002 Document Revised: 06/27/2013 Document Reviewed:  11/11/2011 [facility] Patient Information 2014 [place], [place].

## 2013-10-01 NOTE — Assessment & Plan Note (Signed)
42 year old male with uncomplicated diverticulitis, documented on CT 11/22, doing well with supportive measures and antibiotics. Will need initial screening colonoscopy in about 4-6 weeks. Discussed completing abx, low-fiber diet for next 2 weeks, and avoidance of constipation. Return in 4 weeks to discuss colonoscopy. Likely low-volume hematochezia in past is benign anorectal source.

## 2013-10-01 NOTE — Progress Notes (Signed)
Primary Care Physician:  No PCP Per Patient Primary Gastroenterologist:  Dr. Jena Gauss   Chief Complaint  Patient presents with  . Abdominal Pain    HPI:   Shawn [name] presents today at the request of the ED secondary to recent diagnosis of sigmoid diverticulitis on 11/22. Sent home with Augmentin. Notes chronic history of "belly problems", with intermittent abdominal pain in lower abdominal for the past year. Sometimes constipation, sometimes looks like a cow patty. Occasional low-volume hematochezia. Lower abdominal pain, radiating to LLQ, improved. 100.1 Tmax last night. No chills. Notes chronic intermittent vomiting at different times during the day. Nexium for 3 years, which controls the reflux. Vomiting present even prior to starting Nexium. Notes early satiety, bloating. No unexplained weight loss or lack of appetite. Dark brown stool. No abdominal pain with eating. Occasional NSAIDs for back pain. Occasional BC and Goody powders due to back/neck pain. No dysphagia.   Prior PPIs include Prilosec.   Past Medical History  Diagnosis Date  . Arthritis   . Back pain   . Bipolar 1 disorder   . GERD (gastroesophageal reflux disease)     Past Surgical History  Procedure Laterality Date  . 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 amoxicillin-clavulanate (AUGMENTIN) 875-125 MG per tablet Take 1 tablet by mouth every 12 (twelve) hours.  20 tablet  0  . carbamazepine (TEGRETOL) 200 MG tablet Take 200 mg by mouth 2 (two) times daily.      . citalopram (CELEXA) 20 MG tablet Take 20 mg by mouth daily.      Marland Kitchen esomeprazole (NEXIUM) 40 MG capsule Take 40 mg by mouth daily at 12 noon.      . gabapentin (NEURONTIN) 400 MG capsule Take 400-800 mg by mouth 2 (two) times daily.       Marland Kitchen HYDROcodone-acetaminophen (NORCO/VICODIN) 5-325 MG per tablet Take 1-2 tablets by mouth every 6  (six) hours as needed for moderate pain.  20 tablet  0  . hydrOXYzine (ATARAX/VISTARIL) 50 MG tablet Take 50 mg by mouth 3 (three) times daily.      . Multiple Vitamins-Minerals (MULTIVITAMINS THER. W/MINERALS) TABS tablet Take 1 tablet by mouth daily.      . promethazine (PHENERGAN) 25 MG tablet Take 1 tablet (25 mg total) by mouth every 6 (six) hours as needed for nausea or vomiting.  12 tablet  0  . traZODone (DESYREL) 150 MG tablet Take 300 mg by mouth at bedtime.      Marland Kitchen dexlansoprazole (DEXILANT) 60 MG capsule Take 1 capsule (60 mg total) by mouth daily.  14 capsule  3   No current facility-administered medications for this visit.    Allergies as of 10/01/2013  . (No Known Allergies)    Family History  Problem Relation Age of Onset  . Colon cancer Neg Hx     adopted at 28 months old, unsure about GI history of parents     History   Social History  . Marital Status: Single    Spouse Name: N/A    Number of Children: N/A  . Years of Education: N/A   Occupational History  . Not on file.   Social History Main Topics  . Smoking status: Never Smoker   . Smokeless tobacco: Current User    Types: Snuff  . Alcohol Use: Yes     Comment: "once in  awhile"  . Drug Use: No  . Sexual Activity: Not on file   Other Topics Concern  . Not on file   Social History Narrative  . No narrative on file    Review of Systems: As mentioned in HPI.   Physical Exam: BP 123/81  Pulse 90  Temp(Src) 98.2 F (36.8 C) (Oral)  Wt 191 lb 12.8 oz (87 kg) General:   Alert and oriented. Well-developed, well-nourished, pleasant and cooperative. Head:  Normocephalic and atraumatic. Eyes:  Conjunctiva pink, sclera clear, no icterus.   Conjunctiva pink. Ears:  Normal auditory acuity. Nose:  No deformity, discharge,  or lesions. Mouth:  No deformity or lesions, mucosa pink and moist.  Neck:  Supple, without mass or thyromegaly. Lungs:  Clear to auscultation bilaterally, without wheezing, rales,  or rhonchi.  Heart:  S1, S2 present without murmurs noted.  Abdomen:  +BS, soft, non-tender and non-distended. Without mass or HSM. No rebound or guarding. No hernias noted. Rectal:  Deferred  Msk:  Symmetrical without gross deformities. Normal posture. Extremities:  Without clubbing or edema. Neurologic:  Alert and  oriented x4;  grossly normal neurologically. Skin:  Intact, warm and dry without significant lesions or rashes Cervical Nodes:  No significant cervical adenopathy. Psych:  Alert and cooperative. Normal mood and affect.  Lab Results  Component Value Date   WBC 12.1* 09/29/2013   HGB 14.6 09/29/2013   HCT 43.3 09/29/2013   MCV 89.1 09/29/2013   PLT 250 09/29/2013   Lab Results  Component Value Date   LIPASE 33 09/29/2013   Lab Results  Component Value Date   ALT 24 09/29/2013   AST 23 09/29/2013   ALKPHOS 75 09/29/2013   BILITOT 0.4 09/29/2013

## 2013-10-01 NOTE — Assessment & Plan Note (Signed)
Controlled with Nexium but persistent intermittent nausea and vomiting without any real precipitating factors. Switch to Dexilant and consider EGD at time of colonoscopy. LFTs and lipase on file and normal. May needs GES if EGD negative.

## 2013-10-02 NOTE — Progress Notes (Signed)
No PCP on File 

## 2013-10-17 ENCOUNTER — Telehealth: Payer: Self-pay | Admitting: Internal Medicine

## 2013-10-17 NOTE — Telephone Encounter (Signed)
Pt has called several times today needing to speak with someone regarding a medication needing a PA. He has an OV for 12/22, but needs to speak to someone ASAP. [phone]

## 2013-10-19 NOTE — Telephone Encounter (Signed)
Called pt- he got a denial from dexilant patient assistance d/t lack of income verification. He said he brought his income verification with the paperwork. I didn't have it when I faxed the paperwork to the company. Tobi Bastos do you have this still on your desk?

## 2013-10-22 ENCOUNTER — Telehealth: Payer: Self-pay | Admitting: Internal Medicine

## 2013-10-22 NOTE — Telephone Encounter (Signed)
Per Tobi Bastos, pt needs a CT tomorrow. Pt is aware.  Benedetto Goad, please schedule.

## 2013-10-22 NOTE — Telephone Encounter (Signed)
Spoke with pt- he stated he was having lower abd pain that is the same as it was when he went to the hospital and he had diverticulitis. His temp last night was 99.9. He has been in pain for about 2 days. He finished his abx about 1 week ago. No N/V. Last bm was yesterday and it was mostly normal, a little runny and it burned when he had that BM. He has an ov on Monday 10/29/13 but wants to know if he should be seen sooner or if there is anything else we can do. Please advise.

## 2013-10-22 NOTE — Telephone Encounter (Signed)
I do not have this on my desk. Can we resubmit? May provide samples in interim.

## 2013-10-22 NOTE — Telephone Encounter (Signed)
Pt called while you were at lunch. He wants to know what should he do his stomach is hurting him and he can not wait to be seen next Monday for his OV. We are booked up until Jan. 15th or later. I transferred him to VM and told him he would have to talk with you to make any other recommendations. The woman in the back ground kept yelling that he needed to be seen today that he can't wait until Monday.

## 2013-10-23 ENCOUNTER — Other Ambulatory Visit: Payer: Self-pay | Admitting: Gastroenterology

## 2013-10-23 DIAGNOSIS — R109 Unspecified abdominal pain: Secondary | ICD-10-CM

## 2013-10-23 NOTE — Telephone Encounter (Signed)
Patient just had CT abd/pel w/cm on 09/29/13

## 2013-10-23 NOTE — Telephone Encounter (Signed)
CT of abd/pel is scheduled for Thursday Dec 18th at 4:00 and he is aware

## 2013-10-23 NOTE — Telephone Encounter (Signed)
What type of CT please advise?

## 2013-10-25 ENCOUNTER — Ambulatory Visit (HOSPITAL_COMMUNITY)
Admission: RE | Admit: 2013-10-25 | Discharge: 2013-10-25 | Disposition: A | Discharge status: Home / Self Care | Payer: Self-pay | Source: Ambulatory Visit | Attending: Gastroenterology | Admitting: Gastroenterology

## 2013-10-25 DIAGNOSIS — R109 Unspecified abdominal pain: Secondary | ICD-10-CM | POA: Insufficient documentation

## 2013-10-25 DIAGNOSIS — K5730 Diverticulosis of large intestine without perforation or abscess without bleeding: Secondary | ICD-10-CM | POA: Insufficient documentation

## 2013-10-25 DIAGNOSIS — R911 Solitary pulmonary nodule: Secondary | ICD-10-CM | POA: Insufficient documentation

## 2013-10-25 MED ORDER — IOHEXOL 300 MG/ML  SOLN
100.0000 mL | Freq: Once | INTRAMUSCULAR | Status: AC | PRN
  Administered 2013-10-25: 100 mL via INTRAVENOUS

## 2013-10-29 ENCOUNTER — Ambulatory Visit (INDEPENDENT_AMBULATORY_CARE_PROVIDER_SITE_OTHER): Payer: Self-pay | Admitting: Gastroenterology

## 2013-10-29 ENCOUNTER — Encounter (INDEPENDENT_AMBULATORY_CARE_PROVIDER_SITE_OTHER): Payer: Self-pay

## 2013-10-29 ENCOUNTER — Other Ambulatory Visit: Payer: Self-pay | Admitting: Internal Medicine

## 2013-10-29 ENCOUNTER — Encounter: Payer: Self-pay | Admitting: Gastroenterology

## 2013-10-29 VITALS — BP 141/79 | HR 76 | Temp 97.4°F | Ht 72.0 in | Wt 195.6 lb

## 2013-10-29 DIAGNOSIS — K5732 Diverticulitis of large intestine without perforation or abscess without bleeding: Secondary | ICD-10-CM

## 2013-10-29 DIAGNOSIS — K219 Gastro-esophageal reflux disease without esophagitis: Secondary | ICD-10-CM

## 2013-10-29 DIAGNOSIS — R195 Other fecal abnormalities: Secondary | ICD-10-CM

## 2013-10-29 MED ORDER — HYDROCODONE-ACETAMINOPHEN 5-325 MG PO TABS: 1.0000 | ORAL_TABLET | Freq: Four times a day (QID) | ORAL | Status: DC | PRN

## 2013-10-29 MED ORDER — PEG 3350-KCL-NA BICARB-NACL 420 G PO SOLR: 4000.0000 mL | ORAL | Status: DC

## 2013-10-29 MED ORDER — DICYCLOMINE HCL 10 MG PO CAPS: 10.0000 mg | ORAL_CAPSULE | Freq: Three times a day (TID) | ORAL | Status: DC

## 2013-10-29 NOTE — Progress Notes (Signed)
Referring Provider: No ref. provider found Primary Care Physician:  No PCP Per Patient Primary GI: Dr. Jena Gauss    Chief Complaint  Patient presents with  . Bloated  . Emesis    in the morning  . Abdominal Pain    HPI:   Shawn [name] presents today in follow-up with history of diverticulitis on CT Nov 22. Treated with Augmentin. Last seen late Nov 2014. Called into our office recently with concern for recurrent abdominal pain. CT repeated and negative for acute findings. Watery stools for 3 days. 3-4 times per day. Paper hematochezia. Feels like has to poop 24/7. Lower abdominal pain underlying, feels like "something sitting there".  Feels bloated. Intermittent nausea and vomiting. Dexilant started on Nov 24 at last visit. BC powders routinely. States daily vomiting, sometimes twice a day.   Past Medical History  Diagnosis Date  . Arthritis   . Back pain   . Bipolar 1 disorder   . GERD (gastroesophageal reflux disease)     Past Surgical History  Procedure Laterality Date  . 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 carbamazepine (TEGRETOL) 200 MG tablet Take 200 mg by mouth 2 (two) times daily.      . citalopram (CELEXA) 20 MG tablet Take 20 mg by mouth daily.      Marland Kitchen dexlansoprazole (DEXILANT) 60 MG capsule Take 1 capsule (60 mg total) by mouth daily.  14 capsule  3  . FLUoxetine (PROZAC) 20 MG capsule Take 20 mg by mouth daily.      Marland Kitchen gabapentin (NEURONTIN) 400 MG capsule Take 400-800 mg by mouth 2 (two) times daily.       . hydrOXYzine (ATARAX/VISTARIL) 50 MG tablet Take 50 mg by mouth 3 (three) times daily.      . Multiple Vitamins-Minerals (MULTIVITAMINS THER. W/MINERALS) TABS tablet Take 1 tablet by mouth daily.      . traZODone (DESYREL) 150 MG tablet Take 300 mg by mouth at bedtime.      Marland Kitchen amoxicillin-clavulanate (AUGMENTIN) 875-125 MG per tablet Take 1  tablet by mouth every 12 (twelve) hours.  20 tablet  0  . esomeprazole (NEXIUM) 40 MG capsule Take 40 mg by mouth daily at 12 noon.      Marland Kitchen HYDROcodone-acetaminophen (NORCO/VICODIN) 5-325 MG per tablet Take 1-2 tablets by mouth every 6 (six) hours as needed for moderate pain.  20 tablet  0  . promethazine (PHENERGAN) 25 MG tablet Take 1 tablet (25 mg total) by mouth every 6 (six) hours as needed for nausea or vomiting.  12 tablet  0   No current facility-administered medications for this visit.    Allergies as of 10/29/2013  . (No Known Allergies)    Family History  Problem Relation Age of Onset  . Colon cancer Neg Hx     adopted at 1 months old, unsure about GI history of parents     History   Social History  . Marital Status: Married    Spouse Name: N/A    Number of Children: N/A  . Years of Education: N/A   Social History Main Topics  . Smoking status: Never Smoker   . Smokeless tobacco: Current User    Types: Snuff  . Alcohol Use: Yes     Comment: "once in awhile"  . Drug Use: No  . Sexual Activity: None  Other Topics Concern  . None   Social History Narrative  . None    Review of Systems: As mentioned in HPI.   Physical Exam: BP 141/79  Pulse 76  Temp(Src) 97.4 F (36.3 C) (Oral)  Ht 6' (1.829 m)  Wt 195 lb 9.6 oz (88.724 kg)  BMI 26.52 kg/m2 General:   Alert and oriented. No distress noted. Pleasant and cooperative.  Head:  Normocephalic and atraumatic. Eyes:  Conjuctiva clear without scleral icterus. Mouth:  Oral mucosa pink and moist. Good dentition. No lesions. Neck:  Supple, without mass or thyromegaly. Heart:  S1, S2 present without murmurs, rubs, or gallops. Regular rate and rhythm. Abdomen:  +BS, soft, non-tender and non-distended. No rebound or guarding. No HSM or masses noted. Msk:  Symmetrical without gross deformities. Normal posture. Extremities:  Without edema. Neurologic:  Alert and  oriented x4;  grossly normal neurologically. Skin:   Intact without significant lesions or rashes. Psych:  Alert and cooperative. Normal mood and affect.

## 2013-10-29 NOTE — Telephone Encounter (Signed)
Pt came in today and brought income information. It has been faxed to the patient assistance company.

## 2013-10-29 NOTE — Patient Instructions (Signed)
Continue to take Dexilant each morning. This is for reflux.   Please complete the stool sample and return to the lab. We will call with the results.  I have sent a prescription called Bentyl to your pharmacy. Take this with meals and at bedtime to help with abdominal cramping and loose stool.   We have scheduled you for a colonoscopy and upper endoscopy in the near future with Dr. Jena Gauss.   Have a wonderful Christmas!

## 2013-10-30 ENCOUNTER — Encounter (HOSPITAL_COMMUNITY): Payer: Self-pay | Admitting: Pharmacy Technician

## 2013-10-30 DIAGNOSIS — R195 Other fecal abnormalities: Secondary | ICD-10-CM | POA: Insufficient documentation

## 2013-10-30 LAB — CLOSTRIDIUM DIFFICILE BY PCR: Toxigenic C. Difficile by PCR: DETECTED — CR

## 2013-10-30 MED ORDER — METRONIDAZOLE 500 MG PO TABS: 500.0000 mg | ORAL_TABLET | Freq: Three times a day (TID) | ORAL | Status: DC

## 2013-10-30 NOTE — Assessment & Plan Note (Signed)
C diff PCR  

## 2013-10-30 NOTE — Progress Notes (Signed)
TCS/EGD cancelled due to now found to have +Cdiff.   Let's bring him back in mid Jan 2015 to see how he is doing. He needs a colonoscopy still; I would like to go ahead and have him on the books for late Jan but still see him beforehand.

## 2013-10-30 NOTE — Assessment & Plan Note (Signed)
Noted on CT in Nov 2014. Treated with Augmentin. Recent flare of symptoms with CT negative for recurrent diverticulitis. Now with loose stools. Concern for infectious process, as he was exposed to abx recently. Check Cdiff PCR. Ultimately needs colonoscopy for further lower GI symptoms, no prior colonoscopy.   Cdiff PCR now Bentyl for supportive measures Proceed with TCS with Dr. Jena Gauss in near future: the risks, benefits, and alternatives have been discussed with the patient in detail. The patient states understanding and desires to proceed. Phenergan 25 mg IV on call for procedure

## 2013-10-30 NOTE — Assessment & Plan Note (Signed)
Some improvement with Dexilant but persistent nausea and vomiting, once to twice a day. BC powders routinely. Concern for gastritis, PUD, unable to exclude underlying gastroparesis.   Continue Dexilant Proceed with upper endoscopy at time of TCS in the near future with Dr. Jena Gauss. The risks, benefits, and alternatives have been discussed in detail with patient. They have stated understanding and desire to proceed.

## 2013-10-31 NOTE — Progress Notes (Signed)
No PCP 

## 2013-11-14 ENCOUNTER — Ambulatory Visit (HOSPITAL_COMMUNITY): Admission: RE | Admit: 2013-11-14 | Payer: Self-pay | Source: Ambulatory Visit | Admitting: Internal Medicine

## 2013-11-14 ENCOUNTER — Encounter (HOSPITAL_COMMUNITY): Admission: RE | Payer: Self-pay | Source: Ambulatory Visit

## 2013-11-14 SURGERY — COLONOSCOPY WITH ESOPHAGOGASTRODUODENOSCOPY (EGD)
Anesthesia: Moderate Sedation

## 2013-11-16 ENCOUNTER — Telehealth: Payer: Self-pay | Admitting: *Deleted

## 2013-11-16 NOTE — Telephone Encounter (Signed)
Pt called stating he was on dexilant and was doing good without heat burn. Now that pt is back on nexium he has been throwing up again, please advise [phone]

## 2013-11-20 MED ORDER — DEXLANSOPRAZOLE 60 MG PO CPDR: 60.0000 mg | DELAYED_RELEASE_CAPSULE | Freq: Every day | ORAL | Status: DC

## 2013-11-20 NOTE — Telephone Encounter (Signed)
I sent Dexilant into pharmacy.

## 2013-11-20 NOTE — Addendum Note (Signed)
Addended by: Orvil Feil on: 11/20/2013 02:30 PM   Modules accepted: Orders

## 2013-11-20 NOTE — Telephone Encounter (Signed)
Pt aware that samples are up front for him.

## 2013-11-21 ENCOUNTER — Encounter (HOSPITAL_COMMUNITY): Payer: Self-pay | Admitting: Emergency Medicine

## 2013-11-21 ENCOUNTER — Emergency Department (HOSPITAL_COMMUNITY)
Admission: EM | Admit: 2013-11-21 | Discharge: 2013-11-21 | Disposition: A | Discharge status: Home / Self Care | Payer: Self-pay | Attending: Emergency Medicine | Admitting: Emergency Medicine

## 2013-11-21 DIAGNOSIS — Z8679 Personal history of other diseases of the circulatory system: Secondary | ICD-10-CM | POA: Insufficient documentation

## 2013-11-21 DIAGNOSIS — R55 Syncope and collapse: Secondary | ICD-10-CM | POA: Insufficient documentation

## 2013-11-21 DIAGNOSIS — R42 Dizziness and giddiness: Secondary | ICD-10-CM | POA: Insufficient documentation

## 2013-11-21 DIAGNOSIS — F319 Bipolar disorder, unspecified: Secondary | ICD-10-CM | POA: Insufficient documentation

## 2013-11-21 DIAGNOSIS — Z8739 Personal history of other diseases of the musculoskeletal system and connective tissue: Secondary | ICD-10-CM | POA: Insufficient documentation

## 2013-11-21 DIAGNOSIS — K219 Gastro-esophageal reflux disease without esophagitis: Secondary | ICD-10-CM | POA: Insufficient documentation

## 2013-11-21 DIAGNOSIS — Z87891 Personal history of nicotine dependence: Secondary | ICD-10-CM | POA: Insufficient documentation

## 2013-11-21 DIAGNOSIS — Z79899 Other long term (current) drug therapy: Secondary | ICD-10-CM | POA: Insufficient documentation

## 2013-11-21 HISTORY — DX: Occlusion and stenosis of unspecified cerebral artery: I66.9

## 2013-11-21 HISTORY — DX: Diverticulitis of intestine, part unspecified, without perforation or abscess without bleeding: K57.92

## 2013-11-21 LAB — CBC WITH DIFFERENTIAL/PLATELET
Basophils Absolute: 0 10*3/uL (ref 0.0–0.1)
Basophils Relative: 0 % (ref 0–1)
EOS ABS: 0.1 10*3/uL (ref 0.0–0.7)
EOS PCT: 1 % (ref 0–5)
HEMATOCRIT: 39.1 % (ref 39.0–52.0)
Hemoglobin: 14 g/dL (ref 13.0–17.0)
LYMPHS ABS: 1.7 10*3/uL (ref 0.7–4.0)
Lymphocytes Relative: 24 % (ref 12–46)
MCH: 31.7 pg (ref 26.0–34.0)
MCHC: 35.8 g/dL (ref 30.0–36.0)
MCV: 88.7 fL (ref 78.0–100.0)
MONO ABS: 0.5 10*3/uL (ref 0.1–1.0)
Monocytes Relative: 7 % (ref 3–12)
Neutro Abs: 4.8 10*3/uL (ref 1.7–7.7)
Neutrophils Relative %: 68 % (ref 43–77)
PLATELETS: 199 10*3/uL (ref 150–400)
RBC: 4.41 MIL/uL (ref 4.22–5.81)
RDW: 13 % (ref 11.5–15.5)
WBC: 7 10*3/uL (ref 4.0–10.5)

## 2013-11-21 LAB — BASIC METABOLIC PANEL
BUN: 15 mg/dL (ref 6–23)
CO2: 23 meq/L (ref 19–32)
CREATININE: 0.96 mg/dL (ref 0.50–1.35)
Calcium: 8.9 mg/dL (ref 8.4–10.5)
Chloride: 101 mEq/L (ref 96–112)
GFR calc Af Amer: >90 mL/min (ref >90)
GFR calc non Af Amer: >90 mL/min (ref >90)
GLUCOSE: 96 mg/dL (ref 70–99)
Potassium: 3.3 mEq/L — ABNORMAL LOW (ref 3.7–5.3)
SODIUM: 141 meq/L (ref 137–147)

## 2013-11-21 LAB — GLUCOSE, CAPILLARY: GLUCOSE-CAPILLARY: 100 mg/dL — AB (ref 70–99)

## 2013-11-21 MED ORDER — SODIUM CHLORIDE 0.9 % IV BOLUS (SEPSIS)
1000.0000 mL | Freq: Once | INTRAVENOUS | Status: AC
  Administered 2013-11-21: 1000 mL via INTRAVENOUS

## 2013-11-21 NOTE — ED Notes (Signed)
Pt states was talking with wife and next thing he knows he was "out". Wife states eyes rolled back in head but only lasted few seconds. Pt c/o dizziness now. Pt did not fall. Pt alert/oreinted at this time. Pt slightly pale. Pt c/o lower abd and states has hx of diverticulitis. States has had a lot of bm's today.

## 2013-11-21 NOTE — ED Notes (Signed)
Patient with no complaints at this time. Respirations even and unlabored. Skin warm/dry. Discharge instructions reviewed with patient at this time. Patient given opportunity to voice concerns/ask questions. IV removed per policy and band-aid applied to site. Patient discharged at this time and left Emergency Department with steady gait.  

## 2013-11-21 NOTE — Discharge Instructions (Signed)
Near-Syncope Near-syncope (commonly known as near fainting) is sudden weakness, dizziness, or feeling like you might pass out. During an episode of near-syncope, you may also develop pale skin, have tunnel vision, or feel sick to your stomach (nauseous). Near-syncope may occur when getting up after sitting or while standing for a long time. It is caused by a sudden decrease in blood flow to the brain. This decrease can result from various causes or triggers, most of which are not serious. However, because near-syncope can sometimes be a sign of something serious, a medical evaluation is required. The specific cause is often not determined. HOME CARE INSTRUCTIONS  Monitor your condition for any changes. The following actions may help to alleviate any discomfort you are experiencing:  Have someone stay with you until you feel stable.  Lie down right away if you start feeling like you might faint. Breathe deeply and steadily. Wait until all the symptoms have passed. Most of these episodes last only a few minutes. You may feel tired for several hours.   Drink enough fluids to keep your urine clear or pale yellow.   If you are taking blood pressure or heart medicine, get up slowly when seated or lying down. Take several minutes to sit and then stand. This can reduce dizziness.  Follow up with your health care provider as directed. SEEK IMMEDIATE MEDICAL CARE IF:   You have a severe headache.   You have unusual pain in the chest, abdomen, or back.   You are bleeding from the mouth or rectum, or you have black or tarry stool.   You have an irregular or very fast heartbeat.   You have repeated fainting or have seizure-like jerking during an episode.   You faint when sitting or lying down.   You have confusion.   You have difficulty walking.   You have severe weakness.   You have vision problems.  MAKE SURE YOU:   Understand these instructions.  Will watch your  condition.  Will get help right away if you are not doing well or get worse. Document Released: 10/25/2005 Document Revised: 06/27/2013 Document Reviewed: 03/30/2013 [facility] Patient Information 2014 [place].   Tests were normal. Drink fluids. Eat regular meals. Followup your primary care Dr.

## 2013-11-21 NOTE — ED Provider Notes (Signed)
CSN: [id number]     Arrival date & time 11/21/13  1548 History  This chart was scribed for Shawn Christen, MD by Elby Beck, ED Scribe. This patient was seen in room APA08/APA08 and the patient's care was started at 4:51 PM.   Chief Complaint  Patient presents with  . Near Syncope    The history is provided by the patient. No language interpreter was used.    HPI Comments: Shawn [name] is a 43 y.o. male who presents to the Emergency Department complaining of a near-syncopal episode that occurred earlier today while pt was shopping. Pt states that he felt-lighted headed at that time, and that his light-headedness has partially resolved. Pt states that his wife noticed his eyes to be "rolling in the back of his head" at the time he nearly lost consciousness. He denies any history of similar symptoms. He states that he is an occasional alcohol user and occasional cigar smoker. He states that he dips tobacco regularly. He feels normal now. No chest pain, dyspnea, neurological deficits  PCP- Seen at [facility] in [place]   Past Medical History  Diagnosis Date  . Arthritis   . Back pain   . Bipolar 1 disorder   . GERD (gastroesophageal reflux disease)   . Diverticulitis   . Blood clots in brain    Past Surgical History  Procedure Laterality Date  . Brain surgery      after head injury, patient states had 2 large blood clots evacuated   Family History  Problem Relation Age of Onset  . Colon cancer Neg Hx     adopted at 61 months old, unsure about GI history of parents    History  Substance Use Topics  . Smoking status: Former Research scientist (life sciences)  . Smokeless tobacco: Current User    Types: Snuff  . Alcohol Use: Yes     Comment: "once in awhile"    Review of Systems A complete 10 system review of systems was obtained and all systems are negative except as noted in the HPI and PMH.   Allergies  Review of patient's allergies indicates no known allergies.  Home Medications   Current  Outpatient Rx  Name  Route  Sig  Dispense  Refill  . carbamazepine (TEGRETOL) 200 MG tablet   Oral   Take 200 mg by mouth 2 (two) times daily.         . citalopram (CELEXA) 20 MG tablet   Oral   Take 20 mg by mouth daily.         Marland Kitchen dexlansoprazole (DEXILANT) 60 MG capsule   Oral   Take 1 capsule (60 mg total) by mouth daily.   30 capsule   3   . dicyclomine (BENTYL) 10 MG capsule   Oral   Take 1 capsule (10 mg total) by mouth 4 (four) times daily -  before meals and at bedtime.   120 capsule   3   . FLUoxetine (PROZAC) 20 MG capsule   Oral   Take 20 mg by mouth daily.         . hydrOXYzine (ATARAX/VISTARIL) 50 MG tablet   Oral   Take 50 mg by mouth 3 (three) times daily.         . Multiple Vitamins-Minerals (MULTIVITAMINS THER. W/MINERALS) TABS tablet   Oral   Take 1 tablet by mouth daily.         . traZODone (DESYREL) 150 MG tablet   Oral   Take 300  mg by mouth at bedtime.         . polyethylene glycol-electrolytes (TRILYTE) 420 G solution   Oral   Take 4,000 mLs by mouth as directed.   4000 mL   0    Triage Vitals: BP 109/65  Pulse 63  Temp(Src) 98 F (36.7 C) (Oral)  Resp 20  Ht 6' (1.829 m)  Wt 195 lb (88.451 kg)  BMI 26.44 kg/m2  SpO2 95%  Physical Exam  Nursing note and vitals reviewed. Constitutional: He is oriented to person, place, and time. He appears well-developed and well-nourished.  HENT:  Head: Normocephalic and atraumatic.  Eyes: Conjunctivae and EOM are normal. Pupils are equal, round, and reactive to light.  Neck: Normal range of motion. Neck supple.  Cardiovascular: Normal rate, regular rhythm and normal heart sounds.   Pulmonary/Chest: Effort normal and breath sounds normal.  Abdominal: Soft. Bowel sounds are normal.  Musculoskeletal: Normal range of motion.  Neurological: He is alert and oriented to person, place, and time.  Skin: Skin is warm and dry.  Psychiatric: He has a normal mood and affect. His behavior is  normal.    ED Course  Procedures (including critical care time)  DIAGNOSTIC STUDIES: Oxygen Saturation is 95% on RA, normal by my interpretation.    COORDINATION OF CARE: 4:58 PM- Pt has been given IV fluids. Diagnostic lab work and an EKG have also been obtained. Pt advised of plan for treatment and pt agrees.  Labs Review Labs Reviewed  BASIC METABOLIC PANEL - Abnormal; Notable for the following:    Potassium 3.3 (*)    All other components within normal limits  GLUCOSE, CAPILLARY - Abnormal; Notable for the following:    Glucose-Capillary 100 (*)    All other components within normal limits  CBC WITH DIFFERENTIAL   Imaging Review No results found.  EKG Interpretation    Date/Time:  Wednesday November 21 2013 16:11:09 EST Ventricular Rate:  61 PR Interval:  190 QRS Duration: 94 QT Interval:  444 QTC Calculation: 446 R Axis:   -15 Text Interpretation:  Normal sinus rhythm Moderate voltage criteria for LVH, may be normal variant Borderline ECG When compared with ECG of 07-Aug-2013 12:53, No significant change was found Confirmed by Dequincy Born  MD, Will Heinkel ([id number]) on 11/21/2013 4:37:35 PM            MDM   1. Near syncope    Patient has normal physical exam. EKG and hemoglobin normal.  I personally performed the services described in this documentation, which was scribed in my presence. The recorded information has been reviewed and is accurate.    Shawn Christen, MD 11/21/13 [phone]

## 2013-11-27 ENCOUNTER — Encounter: Payer: Self-pay | Admitting: Gastroenterology

## 2013-11-27 ENCOUNTER — Other Ambulatory Visit: Payer: Self-pay | Admitting: Internal Medicine

## 2013-11-27 ENCOUNTER — Ambulatory Visit (INDEPENDENT_AMBULATORY_CARE_PROVIDER_SITE_OTHER): Payer: Self-pay | Admitting: Gastroenterology

## 2013-11-27 VITALS — BP 133/84 | HR 84 | Temp 97.6°F | Wt 192.8 lb

## 2013-11-27 DIAGNOSIS — R195 Other fecal abnormalities: Secondary | ICD-10-CM

## 2013-11-27 DIAGNOSIS — K219 Gastro-esophageal reflux disease without esophagitis: Secondary | ICD-10-CM

## 2013-11-27 DIAGNOSIS — K5732 Diverticulitis of large intestine without perforation or abscess without bleeding: Secondary | ICD-10-CM

## 2013-11-27 NOTE — Assessment & Plan Note (Addendum)
Likely culprit of N/V. Since addition of Dexilant, symptoms resolved. Continue Dexilant now and hold off on EGD.

## 2013-11-27 NOTE — Progress Notes (Signed)
  Referring Provider: No ref. provider found Primary Care Physician:  No PCP Per Patient Primary GI: Dr. Rourk   Chief Complaint  Patient presents with  . Follow-up    HPI:   Shawn [name] presents today to setup an initial screening colonoscopy and possible EGD. Diverticulitis noted on CT Nov 22, treated with Augmentin. In Dec 2014 noted loose stools and found to have Cdiff. Treated with Flagyl. History also significant for intermittent nausea and vomiting, GERD. Used to take BC powders routinely but now has stopped.  Diarrhea resolved. Course of Flagyl completed. BM about once a day. Sometimes 2 or 3. Since starting Dexilant, N/V resolved. No further upper GI symptoms with PPI.    Past Medical History  Diagnosis Date  . Arthritis   . Back pain   . Bipolar 1 disorder   . GERD (gastroesophageal reflux disease)   . Diverticulitis   . Blood clots in brain     Past Surgical History  Procedure Laterality Date  . 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 carbamazepine (TEGRETOL) 200 MG tablet Take 200 mg by mouth 2 (two) times daily.      . citalopram (CELEXA) 20 MG tablet Take 20 mg by mouth daily.      . dexlansoprazole (DEXILANT) 60 MG capsule Take 1 capsule (60 mg total) by mouth daily.  30 capsule  3  . dicyclomine (BENTYL) 10 MG capsule Take 1 capsule (10 mg total) by mouth 4 (four) times daily -  before meals and at bedtime.  120 capsule  3  . FLUoxetine (PROZAC) 20 MG capsule Take 20 mg by mouth daily.      . hydrOXYzine (ATARAX/VISTARIL) 50 MG tablet Take 50 mg by mouth 3 (three) times daily.      . Multiple Vitamins-Minerals (MULTIVITAMINS THER. W/MINERALS) TABS tablet Take 1 tablet by mouth daily.      . traZODone (DESYREL) 150 MG tablet Take 300 mg by mouth at bedtime.       No current facility-administered medications for this visit.    Allergies  as of 11/27/2013  . (No Known Allergies)    Family History  Problem Relation Age of Onset  . Colon cancer Neg Hx     adopted at 13 months old, unsure about GI history of parents     History   Social History  . Marital Status: Married    Spouse Name: N/A    Number of Children: N/A  . Years of Education: N/A   Social History Main Topics  . Smoking status: Former Smoker  . Smokeless tobacco: Current User    Types: Snuff  . Alcohol Use: Yes     Comment: "once in awhile"  . Drug Use: No  . Sexual Activity: None   Other Topics Concern  . None   Social History Narrative  . None    Review of Systems: As mentioned in HPI.   Physical Exam: BP 133/84  Pulse 84  Temp(Src) 97.6 F (36.4 C) (Oral)  Wt 192 lb 12.8 oz (87.454 kg) General:   Alert and oriented. No distress noted. Pleasant and cooperative.  Head:  Normocephalic and atraumatic. Eyes:  Conjuctiva clear without scleral icterus. Mouth:  Oral mucosa pink and moist. Good dentition. No lesions. Neck:  Supple, without mass or thyromegaly. Heart:  S1, S2 present without murmurs, rubs,   or gallops. Regular rate and rhythm. Abdomen:  +BS, soft, non-tender and non-distended. No rebound or guarding. No HSM or masses noted. Msk:  Symmetrical without gross deformities. Normal posture. Extremities:  Without edema. Neurologic:  Alert and  oriented x4;  grossly normal neurologically. Skin:  Intact without significant lesions or rashes. Cervical Nodes:  No significant cervical adenopathy. Psych:  Alert and cooperative. Normal mood and affect.

## 2013-11-27 NOTE — Assessment & Plan Note (Signed)
Secondary to Cdiff recently. Finished course of Flagyl and now back to baseline bowel habits. Pursue colonoscopy as planned.

## 2013-11-27 NOTE — Patient Instructions (Signed)
Continue Dexilant daily.  We have scheduled you for a colonoscopy with Dr. Gala Romney in the near future.  Further recommendations to follow.

## 2013-11-27 NOTE — Assessment & Plan Note (Addendum)
43 year old male with history of diverticulitis in Nov 2014, now with need for initial screening colonoscopy to rule out any occult GI process. He is asymptomatic and doing well from a lower GI perspective.  Proceed with TCS with Dr. Gala Romney in near future: the risks, benefits, and alternatives have been discussed with the patient in detail. The patient states understanding and desires to proceed. Phenergan 25 mg IV on call due to polypharmacy

## 2013-11-28 ENCOUNTER — Encounter (HOSPITAL_COMMUNITY): Payer: Self-pay | Admitting: Pharmacy Technician

## 2013-11-28 ENCOUNTER — Other Ambulatory Visit: Payer: Self-pay | Admitting: Internal Medicine

## 2013-11-28 DIAGNOSIS — R195 Other fecal abnormalities: Secondary | ICD-10-CM

## 2013-11-28 DIAGNOSIS — K219 Gastro-esophageal reflux disease without esophagitis: Secondary | ICD-10-CM

## 2013-11-28 DIAGNOSIS — K5732 Diverticulitis of large intestine without perforation or abscess without bleeding: Secondary | ICD-10-CM

## 2013-11-28 NOTE — Progress Notes (Signed)
NO PCP

## 2013-12-05 ENCOUNTER — Ambulatory Visit: Admit: 2013-12-05 | Payer: Self-pay | Admitting: Internal Medicine

## 2013-12-05 ENCOUNTER — Ambulatory Visit (HOSPITAL_COMMUNITY)
Admission: RE | Admit: 2013-12-05 | Discharge: 2013-12-05 | Disposition: A | Discharge status: Home / Self Care | Payer: Self-pay | Source: Ambulatory Visit | Attending: Internal Medicine | Admitting: Internal Medicine

## 2013-12-05 ENCOUNTER — Encounter (HOSPITAL_COMMUNITY): Payer: Self-pay | Admitting: *Deleted

## 2013-12-05 ENCOUNTER — Encounter (HOSPITAL_COMMUNITY)
Admission: RE | Disposition: A | Discharge status: Home / Self Care | Payer: Self-pay | Source: Ambulatory Visit | Attending: Internal Medicine

## 2013-12-05 DIAGNOSIS — K2100 Gastro-esophageal reflux disease with esophagitis, without bleeding: Secondary | ICD-10-CM

## 2013-12-05 DIAGNOSIS — D126 Benign neoplasm of colon, unspecified: Secondary | ICD-10-CM

## 2013-12-05 DIAGNOSIS — K6389 Other specified diseases of intestine: Secondary | ICD-10-CM

## 2013-12-05 DIAGNOSIS — Z8719 Personal history of other diseases of the digestive system: Secondary | ICD-10-CM | POA: Insufficient documentation

## 2013-12-05 DIAGNOSIS — K219 Gastro-esophageal reflux disease without esophagitis: Secondary | ICD-10-CM

## 2013-12-05 DIAGNOSIS — R195 Other fecal abnormalities: Secondary | ICD-10-CM

## 2013-12-05 DIAGNOSIS — Z1211 Encounter for screening for malignant neoplasm of colon: Secondary | ICD-10-CM | POA: Insufficient documentation

## 2013-12-05 DIAGNOSIS — K5732 Diverticulitis of large intestine without perforation or abscess without bleeding: Secondary | ICD-10-CM

## 2013-12-05 DIAGNOSIS — K449 Diaphragmatic hernia without obstruction or gangrene: Secondary | ICD-10-CM | POA: Insufficient documentation

## 2013-12-05 HISTORY — PX: COLONOSCOPY WITH ESOPHAGOGASTRODUODENOSCOPY (EGD): SHX5779

## 2013-12-05 HISTORY — DX: Vomiting, unspecified: R11.10

## 2013-12-05 SURGERY — COLONOSCOPY
Anesthesia: Moderate Sedation

## 2013-12-05 SURGERY — COLONOSCOPY WITH ESOPHAGOGASTRODUODENOSCOPY (EGD)
Anesthesia: Moderate Sedation

## 2013-12-05 MED ORDER — PROMETHAZINE HCL 25 MG/ML IJ SOLN
25.0000 mg | Freq: Once | INTRAMUSCULAR | Status: AC
  Administered 2013-12-05: 25 mg via INTRAVENOUS
  Filled 2013-12-05: qty 1

## 2013-12-05 MED ORDER — SODIUM CHLORIDE 0.9 % IV SOLN
INTRAVENOUS | Status: DC
  Administered 2013-12-05: 10:00:00 via INTRAVENOUS

## 2013-12-05 MED ORDER — LIDOCAINE VISCOUS 2 % MT SOLN
OROMUCOSAL | Status: AC
  Filled 2013-12-05: qty 15

## 2013-12-05 MED ORDER — MEPERIDINE HCL 100 MG/ML IJ SOLN
INTRAMUSCULAR | Status: DC | PRN
  Administered 2013-12-05 (×2): 50 mg via INTRAVENOUS

## 2013-12-05 MED ORDER — SODIUM CHLORIDE 0.9 % IJ SOLN
INTRAMUSCULAR | Status: AC
  Filled 2013-12-05: qty 10

## 2013-12-05 MED ORDER — STERILE WATER FOR IRRIGATION IR SOLN
Status: DC | PRN
  Administered 2013-12-05: 11:00:00

## 2013-12-05 MED ORDER — ONDANSETRON HCL 4 MG/2ML IJ SOLN
INTRAMUSCULAR | Status: AC
  Filled 2013-12-05: qty 2

## 2013-12-05 MED ORDER — LIDOCAINE VISCOUS 2 % MT SOLN
OROMUCOSAL | Status: DC | PRN
  Administered 2013-12-05 (×2): 2 mL via OROMUCOSAL

## 2013-12-05 MED ORDER — MIDAZOLAM HCL 5 MG/5ML IJ SOLN
INTRAMUSCULAR | Status: AC
  Filled 2013-12-05: qty 10

## 2013-12-05 MED ORDER — MEPERIDINE HCL 100 MG/ML IJ SOLN
INTRAMUSCULAR | Status: AC
  Filled 2013-12-05: qty 2

## 2013-12-05 MED ORDER — MIDAZOLAM HCL 5 MG/5ML IJ SOLN
INTRAMUSCULAR | Status: DC | PRN
  Administered 2013-12-05 (×2): 2 mg via INTRAVENOUS
  Administered 2013-12-05: 1 mg via INTRAVENOUS

## 2013-12-05 MED ORDER — ONDANSETRON HCL 4 MG/2ML IJ SOLN
INTRAMUSCULAR | Status: DC | PRN
  Administered 2013-12-05: 4 mg via INTRAVENOUS

## 2013-12-05 NOTE — H&P (View-Only) (Signed)
Referring Provider: No ref. provider found Primary Care Physician:  No PCP Per Patient Primary GI: Dr. Gala Romney   Chief Complaint  Patient presents with  . Follow-up    HPI:   Shawn [name] presents today to setup an initial screening colonoscopy and possible EGD. Diverticulitis noted on CT Nov 22, treated with Augmentin. In Dec 2014 noted loose stools and found to have Cdiff. Treated with Flagyl. History also significant for intermittent nausea and vomiting, GERD. Used to take [facility] powders routinely but now has stopped.  Diarrhea resolved. Course of Flagyl completed. BM about once a day. Sometimes 2 or 3. Since starting Dexilant, N/V resolved. No further upper GI symptoms with PPI.    Past Medical History  Diagnosis Date  . Arthritis   . Back pain   . Bipolar 1 disorder   . GERD (gastroesophageal reflux disease)   . Diverticulitis   . Blood clots in brain     Past Surgical History  Procedure Laterality Date  . 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 carbamazepine (TEGRETOL) 200 MG tablet Take 200 mg by mouth 2 (two) times daily.      . citalopram (CELEXA) 20 MG tablet Take 20 mg by mouth daily.      Marland Kitchen dexlansoprazole (DEXILANT) 60 MG capsule Take 1 capsule (60 mg total) by mouth daily.  30 capsule  3  . dicyclomine (BENTYL) 10 MG capsule Take 1 capsule (10 mg total) by mouth 4 (four) times daily -  before meals and at bedtime.  120 capsule  3  . FLUoxetine (PROZAC) 20 MG capsule Take 20 mg by mouth daily.      . hydrOXYzine (ATARAX/VISTARIL) 50 MG tablet Take 50 mg by mouth 3 (three) times daily.      . Multiple Vitamins-Minerals (MULTIVITAMINS THER. W/MINERALS) TABS tablet Take 1 tablet by mouth daily.      . traZODone (DESYREL) 150 MG tablet Take 300 mg by mouth at bedtime.       No current facility-administered medications for this visit.    Allergies  as of 11/27/2013  . (No Known Allergies)    Family History  Problem Relation Age of Onset  . Colon cancer Neg Hx     adopted at 10 months old, unsure about GI history of parents     History   Social History  . Marital Status: Married    Spouse Name: N/A    Number of Children: N/A  . Years of Education: N/A   Social History Main Topics  . Smoking status: Former Research scientist (life sciences)  . Smokeless tobacco: Current User    Types: Snuff  . Alcohol Use: Yes     Comment: "once in awhile"  . Drug Use: No  . Sexual Activity: None   Other Topics Concern  . None   Social History Narrative  . None    Review of Systems: As mentioned in HPI.   Physical Exam: BP 133/84  Pulse 84  Temp(Src) 97.6 F (36.4 C) (Oral)  Wt 192 lb 12.8 oz (87.454 kg) General:   Alert and oriented. No distress noted. Pleasant and cooperative.  Head:  Normocephalic and atraumatic. Eyes:  Conjuctiva clear without scleral icterus. Mouth:  Oral mucosa pink and moist. Good dentition. No lesions. Neck:  Supple, without mass or thyromegaly. Heart:  S1, S2 present without murmurs, rubs,  or gallops. Regular rate and rhythm. Abdomen:  +BS, soft, non-tender and non-distended. No rebound or guarding. No HSM or masses noted. Msk:  Symmetrical without gross deformities. Normal posture. Extremities:  Without edema. Neurologic:  Alert and  oriented x4;  grossly normal neurologically. Skin:  Intact without significant lesions or rashes. Cervical Nodes:  No significant cervical adenopathy. Psych:  Alert and cooperative. Normal mood and affect.

## 2013-12-05 NOTE — Discharge Instructions (Addendum)
Colonoscopy Discharge Instructions  Read the instructions outlined below and refer to this sheet in the next few weeks. These discharge instructions provide you with general information on caring for yourself after you leave the hospital. Your doctor may also give you specific instructions. While your treatment has been planned according to the most current medical practices available, unavoidable complications occasionally occur. If you have any problems or questions after discharge, call Dr. Gala Romney at [phone]. ACTIVITY  You may resume your regular activity, but move at a slower pace for the next 24 hours.   Take frequent rest periods for the next 24 hours.   Walking will help get rid of the air and reduce the bloated feeling in your belly (abdomen).   No driving for 24 hours (because of the medicine (anesthesia) used during the test).    Do not sign any important legal documents or operate any machinery for 24 hours (because of the anesthesia used during the test).  NUTRITION  Drink plenty of fluids.   You may resume your normal diet as instructed by your doctor.   Begin with a light meal and progress to your normal diet. Heavy or fried foods are harder to digest and may make you feel sick to your stomach (nauseated).   Avoid alcoholic beverages for 24 hours or as instructed.  MEDICATIONS  You may resume your normal medications unless your doctor tells you otherwise.  WHAT YOU CAN EXPECT TODAY  Some feelings of bloating in the abdomen.   Passage of more gas than usual.   Spotting of blood in your stool or on the toilet paper.  IF YOU HAD POLYPS REMOVED DURING THE COLONOSCOPY:  No aspirin products for 7 days or as instructed.   No alcohol for 7 days or as instructed.   Eat a soft diet for the next 24 hours.  FINDING OUT THE RESULTS OF YOUR TEST Not all test results are available during your visit. If your test results are not back during the visit, make an appointment  with your caregiver to find out the results. Do not assume everything is normal if you have not heard from your caregiver or the medical facility. It is important for you to follow up on all of your test results.  SEEK IMMEDIATE MEDICAL ATTENTION IF:  You have more than a spotting of blood in your stool.   Your belly is swollen (abdominal distention).   You are nauseated or vomiting.   You have a temperature over 101.  You have abdominal pain or discomfort that is severe or gets worse throughout the day. EGD Discharge instructions Please read the instructions outlined below and refer to this sheet in the next few weeks. These discharge instructions provide you with general information on caring for yourself after you leave the hospital. Your doctor may also give you specific instructions. While your treatment has been planned according to the most current medical practices available, unavoidable complications occasionally occur. If you have any problems or questions after discharge, please call your doctor. ACTIVITY You may resume your regular activity but move at a slower pace for the next 24 hours.  Take frequent rest periods for the next 24 hours.  Walking will help expel (get rid of) the air and reduce the bloated feeling in your abdomen.  No driving for 24 hours (because of the anesthesia (medicine) used during the test).  You may shower.  Do not sign any important legal documents or operate any machinery for 24  hours (because of the anesthesia used during the test).  NUTRITION Drink plenty of fluids.  You may resume your normal diet.  Begin with a light meal and progress to your normal diet.  Avoid alcoholic beverages for 24 hours or as instructed by your caregiver.  MEDICATIONS You may resume your normal medications unless your caregiver tells you otherwise.  WHAT YOU CAN EXPECT TODAY You may experience abdominal discomfort such as a feeling of fullness or gas pains.   FOLLOW-UP Your doctor will discuss the results of your test with you.  SEEK IMMEDIATE MEDICAL ATTENTION IF ANY OF THE FOLLOWING OCCUR: Excessive nausea (feeling sick to your stomach) and/or vomiting.  Severe abdominal pain and distention (swelling).  Trouble swallowing.  Temperature over 101 F (37.8 C).  Rectal bleeding or vomiting of blood.    GERD, polyp and diverticulosis information provided  Continue Dexilant 60 mg daily  Begin Benefiber 2 teaspoons twice daily  You need to followup with your primary care physician to arrange a followup CT (of the chest) to reevaluate the lung nodule seen of last year CT.  Further recommendations to follow pending review of pathology report  Gastroesophageal Reflux Disease, Adult Gastroesophageal reflux disease (GERD) happens when acid from your stomach flows up into the esophagus. When acid comes in contact with the esophagus, the acid causes soreness (inflammation) in the esophagus. Over time, GERD may create small holes (ulcers) in the lining of the esophagus. CAUSES   Increased body weight. This puts pressure on the stomach, making acid rise from the stomach into the esophagus.  Smoking. This increases acid production in the stomach.  Drinking alcohol. This causes decreased pressure in the lower esophageal sphincter (valve or ring of muscle between the esophagus and stomach), allowing acid from the stomach into the esophagus.  Late evening meals and a full stomach. This increases pressure and acid production in the stomach.  A malformed lower esophageal sphincter. Sometimes, no cause is found. SYMPTOMS   Burning pain in the lower part of the mid-chest behind the breastbone and in the mid-stomach area. This may occur twice a week or more often.  Trouble swallowing.  Sore throat.  Dry cough.  Asthma-like symptoms including chest tightness, shortness of breath, or wheezing. DIAGNOSIS  Your caregiver may be able to diagnose GERD  based on your symptoms. In some cases, X-rays and other tests may be done to check for complications or to check the condition of your stomach and esophagus. TREATMENT  Your caregiver may recommend over-the-counter or prescription medicines to help decrease acid production. Ask your caregiver before starting or adding any new medicines.  HOME CARE INSTRUCTIONS   Change the factors that you can control. Ask your caregiver for guidance concerning weight loss, quitting smoking, and alcohol consumption.  Avoid foods and drinks that make your symptoms worse, such as:  Caffeine or alcoholic drinks.  Chocolate.  Peppermint or mint flavorings.  Garlic and onions.  Spicy foods.  Citrus fruits, such as oranges, lemons, or limes.  Tomato-based foods such as sauce, chili, salsa, and pizza.  Fried and fatty foods.  Avoid lying down for the 3 hours prior to your bedtime or prior to taking a nap.  Eat small, frequent meals instead of large meals.  Wear loose-fitting clothing. Do not wear anything tight around your waist that causes pressure on your stomach.  Raise the head of your bed 6 to 8 inches with wood blocks to help you sleep. Extra pillows will not help.  Only take over-the-counter or prescription medicines for pain, discomfort, or fever as directed by your caregiver.  Do not take aspirin, ibuprofen, or other nonsteroidal anti-inflammatory drugs (NSAIDs). SEEK IMMEDIATE MEDICAL CARE IF:   You have pain in your arms, neck, jaw, teeth, or back.  Your pain increases or changes in intensity or duration.  You develop nausea, vomiting, or sweating (diaphoresis).  You develop shortness of breath, or you faint.  Your vomit is green, yellow, black, or looks like coffee grounds or blood.  Your stool is red, bloody, or black. These symptoms could be signs of other problems, such as heart disease, gastric bleeding, or esophageal bleeding. MAKE SURE YOU:   Understand these  instructions.  Will watch your condition.  Will get help right away if you are not doing well or get worse.   Colon Polyps Polyps are lumps of extra tissue growing inside the body. Polyps can grow in the large intestine (colon). Most colon polyps are noncancerous (benign). However, some colon polyps can become cancerous over time. Polyps that are larger than a pea may be harmful. To be safe, caregivers remove and test all polyps. CAUSES  Polyps form when mutations in the genes cause your cells to grow and divide even though no more tissue is needed. RISK FACTORS There are a number of risk factors that can increase your chances of getting colon polyps. They include:  Being older than 50 years.  Family history of colon polyps or colon cancer.  Long-term colon diseases, such as colitis or Crohn disease.  Being overweight.  Smoking.  Being inactive.  Drinking too much alcohol. SYMPTOMS  Most small polyps do not cause symptoms. If symptoms are present, they may include:  Blood in the stool. The stool may look dark red or black.  Constipation or diarrhea that lasts longer than 1 week. DIAGNOSIS People often do not know they have polyps until their caregiver finds them during a regular checkup. Your caregiver can use 4 tests to check for polyps:  Digital rectal exam. The caregiver wears gloves and feels inside the rectum. This test would find polyps only in the rectum.  Barium enema. The caregiver puts a liquid called barium into your rectum before taking X-rays of your colon. Barium makes your colon look white. Polyps are dark, so they are easy to see in the X-ray pictures.  Sigmoidoscopy. A thin, flexible tube (sigmoidoscope) is placed into your rectum. The sigmoidoscope has a light and tiny camera in it. The caregiver uses the sigmoidoscope to look at the last third of your colon.  Colonoscopy. This test is like sigmoidoscopy, but the caregiver looks at the entire colon. This is  the most common method for finding and removing polyps. TREATMENT  Any polyps will be removed during a sigmoidoscopy or colonoscopy. The polyps are then tested for cancer. PREVENTION  To help lower your risk of getting more colon polyps:  Eat plenty of fruits and vegetables. Avoid eating fatty foods.  Do not smoke.  Avoid drinking alcohol.  Exercise every day.  Lose weight if recommended by your caregiver.  Eat plenty of calcium and folate. Foods that are rich in calcium include milk, cheese, and broccoli. Foods that are rich in folate include chickpeas, kidney beans, and spinach. HOME CARE INSTRUCTIONS Keep all follow-up appointments as directed by your caregiver. You may need periodic exams to check for polyps. SEEK MEDICAL CARE IF: You notice bleeding during a bowel movement.   Diverticulosis Diverticulosis is a common condition  that develops when small pouches (diverticula) form in the wall of the colon. The risk of diverticulosis increases with age. It happens more often in people who eat a low-fiber diet. Most individuals with diverticulosis have no symptoms. Those individuals with symptoms usually experience abdominal pain, constipation, or loose stools (diarrhea). HOME CARE INSTRUCTIONS  Increase the amount of fiber in your diet as directed by your caregiver or dietician. This may reduce symptoms of diverticulosis. Your caregiver may recommend taking a dietary fiber supplement. Drink at least 6 to 8 glasses of water each day to prevent constipation. Try not to strain when you have a bowel movement. Your caregiver may recommend avoiding nuts and seeds to prevent complications, although this is still an uncertain benefit. Only take over-the-counter or prescription medicines for pain, discomfort, or fever as directed by your caregiver. FOODS WITH HIGH FIBER CONTENT INCLUDE: Fruits. Apple, peach, pear, tangerine, raisins, prunes. Vegetables. Brussels sprouts, asparagus,  broccoli, cabbage, carrot, cauliflower, romaine lettuce, spinach, summer squash, tomato, winter squash, zucchini. Starchy Vegetables. Baked beans, kidney beans, lima beans, split peas, lentils, potatoes (with skin). Grains. Whole wheat bread, brown rice, bran flake cereal, plain oatmeal, white rice, shredded wheat, bran muffins. SEEK IMMEDIATE MEDICAL CARE IF:  You develop increasing pain or severe bloating. You have an oral temperature above 102 F (38.9 C), not controlled by medicine. You develop vomiting or bowel movements that are bloody or black.

## 2013-12-05 NOTE — Op Note (Signed)
[facility] [address] [place], [address]   ENDOSCOPY PROCEDURE REPORT  PATIENT: [name], Shawn  MR#: [id number] BIRTHDATE: 09/22/1971 , 42  yrs. old GENDER: Male ENDOSCOPIST: R.  Garfield Cornea, MD [facility] REFERRED BY:      / none PROCEDURE DATE:  12/05/2013 PROCEDURE:     Diagnostic EGD  INDICATIONS:     long-standing GERD  INFORMED CONSENT:   The risks, benefits, limitations, alternatives and imponderables have been discussed.  The potential for biopsy, esophogeal dilation, etc. have also been reviewed.  Questions have been answered.  All parties agreeable.  Please see the history and physical in the medical record for more information.  MEDICATIONS: Versed 4 mg IV and Demerol 100 mg IV in divided doses. Phenergan 25 mg IV and Zofran 4 mg IV  DESCRIPTION OF PROCEDURE:   The [id number] ([id number])  endoscope was introduced through the mouth and advanced to the second portion of the duodenum without difficulty or limitations.  The mucosal surfaces were surveyed very carefully during advancement of the scope and upon withdrawal.  Retroflexion view of the proximal stomach and esophagogastric junction was performed.      FINDINGS: Patulous EG junction. Somewhat undulating Z line.  Couple of tiny distal esophageal erosions straddling the GE junction. Endoscopically, no evidence of Barrett's esophagus. Stomach empty. 3 cm hiatal hernia; otherwise, normal gastric mucosa. Patent pylorus. Normal first and second portion of the duodenum and  THERAPEUTIC / DIAGNOSTIC MANEUVERS PERFORMED:   none   COMPLICATIONS:  None  IMPRESSION:   Mild erosive reflux esophagitis. Patulous EG junction. Hiatal hernia.  RECOMMENDATIONS:  GERD information provided.  Continue Dexilant 60 mg orally daily. See colonoscopy report.    _______________________________ R. Garfield Cornea, MD FACP [facility] eSigned:  R. Garfield Cornea, MD FACP [facility] 12/05/2013 11:01  AM     CC:  PATIENT NAME:  [name], Shawn MR#: [id number]

## 2013-12-05 NOTE — Interval H&P Note (Signed)
History and Physical Interval Note:  12/05/2013 10:41 AM  Shawn [name]  has presented today for surgery, with the diagnosis of DIVERTICULITIS, LOOSE STOOLS AND GERD  The various methods of treatment have been discussed with the patient and family. After consideration of risks, benefits and other options for treatment, the patient has consented to  Procedure(s) with comments: COLONOSCOPY WITH ESOPHAGOGASTRODUODENOSCOPY (EGD) (N/A) - 1030 as a surgical intervention .  The patient's history has been reviewed, patient examined, no change in status, stable for surgery.  I have reviewed the patient's chart and labs.  Questions were answered to the patient's satisfaction.   Diverticulitis symptoms and C. difficile symptoms resolve. Patient complains of long-standing chronic GERD;  Wants his upper GI tract evaluated. That is not unreasonable. We'll plan for EGD and colonoscopy today The risks, benefits, limitations, imponderables and alternatives regarding both EGD and colonoscopy have been reviewed with the patient. Questions have been answered. All parties agreeable.   Manus Rudd

## 2013-12-06 ENCOUNTER — Encounter: Payer: Self-pay | Admitting: Internal Medicine

## 2013-12-06 NOTE — Op Note (Signed)
[facility] [address] [place], [address]   COLONOSCOPY PROCEDURE REPORT  PATIENT: [name], Shawn  MR#:         [id number] BIRTHDATE: May 16, 1971 , 42  yrs. old GENDER: Male ENDOSCOPIST: R.  Garfield Cornea, MD [facility] REFERRED BY:     No primary care physician PROCEDURE DATE:  12/05/2013 PROCEDURE:     Colonoscopy with snare polypectomy  INDICATIONS: First ever colonoscopy; recent diverticulitis; abnormal colon on CT  INFORMED CONSENT:  The risks, benefits, alternatives and imponderables including but not limited to bleeding, perforation as well as the possibility of a missed lesion have been reviewed.  The potential for biopsy, lesion removal, etc. have also been discussed.  Questions have been answered.  All parties agreeable. Please see the history and physical in the medical record for more information.  MEDICATIONS: Versed 5 mg IV and Demerol 100 mg IV in divided doses. Zofran 4 mg IV. Phenergan 25 mg IV.  DESCRIPTION OF PROCEDURE:  After a digital rectal exam was performed, the EG-[id number] ([id number]) and EC-[id number] ([id number]) colonoscope was advanced from the anus through the rectum and colon to the area of the cecum, ileocecal valve and appendiceal orifice. The cecum was deeply intubated.  These structures were well-seen and photographed for the record.  From the level of the cecum and ileocecal valve, the scope was slowly and cautiously withdrawn. The mucosal surfaces were carefully surveyed utilizing scope tip deflection to facilitate fold flattening as needed.  The scope was pulled down into the rectum where a thorough examination including retroflexion was performed.    FINDINGS:  Adequate preparation. Melanosis coli; normal-appearing rectal mucosa;  Scattered left-sided diverticula; (1) angry appearing 6 mm pedunculated polyp in the mid sigmoid segment. Aside from Melanosis Coli, the Remainder of the Colonic Mucosa  Appeared Unremarkable.  THERAPEUTIC / DIAGNOSTIC MANEUVERS PERFORMED:  The above-mentioned polyp was cleanly removed with hot snare cautery.  COMPLICATIONS: None  CECAL WITHDRAWAL TIME:  IMPRESSION:  Melanosis coli. Colonic diverticulosis. Colonic polyp-removed as described above  RECOMMENDATIONS:   _______________________________ eSigned:  R. Garfield Cornea, MD FACP [facility] 12/06/2013 10:39 AM   CC:    PATIENT NAME:  Shawn, [name] MR#: [id number]

## 2013-12-10 ENCOUNTER — Encounter (HOSPITAL_COMMUNITY): Payer: Self-pay | Admitting: Internal Medicine

## 2013-12-11 ENCOUNTER — Other Ambulatory Visit (HOSPITAL_COMMUNITY): Payer: Self-pay | Admitting: Physician Assistant

## 2013-12-11 DIAGNOSIS — R911 Solitary pulmonary nodule: Secondary | ICD-10-CM

## 2014-01-01 ENCOUNTER — Telehealth: Payer: Self-pay | Admitting: General Practice

## 2014-01-01 MED ORDER — HYDROCORTISONE ACETATE 25 MG RE SUPP: 25.0000 mg | Freq: Two times a day (BID) | RECTAL | Status: DC

## 2014-01-01 NOTE — Telephone Encounter (Signed)
I called pt. He said he had several BM's yesterday, ( not diarrhea and not hard stools) and he had a little blood on tissue when he wiped.  This AM he had a BM and there was a lot of bright red blood in the comode. Just that once.  I told him I will see what Laban Emperor, NP recommends and give him a call. However, if he has a lot of blood to go to the ED.   He has a hx of hemorrhoids. Please advise!

## 2014-01-01 NOTE — Telephone Encounter (Signed)
Colonoscopy completed 1/28. Doubt significant GI bleed. Although polypectomy performed, this would be a late presentation for this. I doubt that is the case.   Will send Anusol suppositories BID X 7 days. If significant bleeding, seek medical attention.

## 2014-01-01 NOTE — Telephone Encounter (Signed)
Called and left the message on the VM. I told him if he has questions to please call back before 3:00 pm today. We will be leaving early.

## 2014-01-01 NOTE — Telephone Encounter (Signed)
Patient called and stated he used the bathroom several times yesterday and noticed he had some blood when he wiped.  He's noticed this morning that when he used the bathroom, the toilet had nothing but blood in it.  Routing to [facility] for follow-up

## 2014-01-01 NOTE — Telephone Encounter (Signed)
Pt called back and was in formed

## 2014-01-23 ENCOUNTER — Ambulatory Visit (HOSPITAL_COMMUNITY)
Admission: RE | Admit: 2014-01-23 | Discharge: 2014-01-23 | Disposition: A | Discharge status: Home / Self Care | Payer: Self-pay | Source: Ambulatory Visit | Attending: Physician Assistant | Admitting: Physician Assistant

## 2014-01-23 DIAGNOSIS — R911 Solitary pulmonary nodule: Secondary | ICD-10-CM | POA: Insufficient documentation

## 2014-10-20 ENCOUNTER — Encounter (HOSPITAL_COMMUNITY): Payer: Self-pay | Admitting: Emergency Medicine

## 2014-10-20 ENCOUNTER — Emergency Department (HOSPITAL_COMMUNITY)
Admission: EM | Admit: 2014-10-20 | Discharge: 2014-10-20 | Disposition: A | Discharge status: Home / Self Care | Payer: BC Managed Care – PPO | Attending: Emergency Medicine | Admitting: Emergency Medicine

## 2014-10-20 ENCOUNTER — Emergency Department (HOSPITAL_COMMUNITY): Payer: BC Managed Care – PPO

## 2014-10-20 DIAGNOSIS — Y9289 Other specified places as the place of occurrence of the external cause: Secondary | ICD-10-CM | POA: Diagnosis not present

## 2014-10-20 DIAGNOSIS — K219 Gastro-esophageal reflux disease without esophagitis: Secondary | ICD-10-CM | POA: Insufficient documentation

## 2014-10-20 DIAGNOSIS — W2219XA Striking against or struck by other automobile airbag, initial encounter: Secondary | ICD-10-CM | POA: Insufficient documentation

## 2014-10-20 DIAGNOSIS — T1490XA Injury, unspecified, initial encounter: Secondary | ICD-10-CM

## 2014-10-20 DIAGNOSIS — L03012 Cellulitis of left finger: Secondary | ICD-10-CM | POA: Insufficient documentation

## 2014-10-20 DIAGNOSIS — Z87891 Personal history of nicotine dependence: Secondary | ICD-10-CM | POA: Diagnosis not present

## 2014-10-20 DIAGNOSIS — Z23 Encounter for immunization: Secondary | ICD-10-CM | POA: Diagnosis not present

## 2014-10-20 DIAGNOSIS — Y9389 Activity, other specified: Secondary | ICD-10-CM | POA: Insufficient documentation

## 2014-10-20 DIAGNOSIS — Z7952 Long term (current) use of systemic steroids: Secondary | ICD-10-CM | POA: Diagnosis not present

## 2014-10-20 DIAGNOSIS — Z79899 Other long term (current) drug therapy: Secondary | ICD-10-CM | POA: Insufficient documentation

## 2014-10-20 DIAGNOSIS — Z8679 Personal history of other diseases of the circulatory system: Secondary | ICD-10-CM | POA: Diagnosis not present

## 2014-10-20 DIAGNOSIS — S60012A Contusion of left thumb without damage to nail, initial encounter: Secondary | ICD-10-CM | POA: Diagnosis not present

## 2014-10-20 DIAGNOSIS — M1990 Unspecified osteoarthritis, unspecified site: Secondary | ICD-10-CM | POA: Diagnosis not present

## 2014-10-20 DIAGNOSIS — Y998 Other external cause status: Secondary | ICD-10-CM | POA: Insufficient documentation

## 2014-10-20 DIAGNOSIS — Z791 Long term (current) use of non-steroidal anti-inflammatories (NSAID): Secondary | ICD-10-CM | POA: Diagnosis not present

## 2014-10-20 DIAGNOSIS — F319 Bipolar disorder, unspecified: Secondary | ICD-10-CM | POA: Diagnosis not present

## 2014-10-20 DIAGNOSIS — S6992XA Unspecified injury of left wrist, hand and finger(s), initial encounter: Secondary | ICD-10-CM | POA: Diagnosis present

## 2014-10-20 DIAGNOSIS — Z792 Long term (current) use of antibiotics: Secondary | ICD-10-CM | POA: Insufficient documentation

## 2014-10-20 DIAGNOSIS — S6010XA Contusion of unspecified finger with damage to nail, initial encounter: Secondary | ICD-10-CM

## 2014-10-20 MED ORDER — NAPROXEN 500 MG PO TABS: 500.0000 mg | ORAL_TABLET | Freq: Two times a day (BID) | ORAL | Status: DC

## 2014-10-20 MED ORDER — TETANUS-DIPHTH-ACELL PERTUSSIS 5-2.5-18.5 LF-MCG/0.5 IM SUSP
0.5000 mL | Freq: Once | INTRAMUSCULAR | Status: AC
  Administered 2014-10-20: 0.5 mL via INTRAMUSCULAR
  Filled 2014-10-20: qty 0.5

## 2014-10-20 MED ORDER — LIDOCAINE HCL (PF) 1 % IJ SOLN
5.0000 mL | Freq: Once | INTRAMUSCULAR | Status: AC
  Administered 2014-10-20: 5 mL via INTRADERMAL

## 2014-10-20 MED ORDER — HYDROCODONE-ACETAMINOPHEN 5-325 MG PO TABS: 2.0000 | ORAL_TABLET | ORAL | Status: DC | PRN

## 2014-10-20 MED ORDER — SULFAMETHOXAZOLE-TRIMETHOPRIM 800-160 MG PO TABS: 1.0000 | ORAL_TABLET | Freq: Two times a day (BID) | ORAL | Status: DC

## 2014-10-20 MED ORDER — POVIDONE-IODINE 10 % EX SOLN
CUTANEOUS | Status: AC
  Administered 2014-10-20: 15:00:00
  Filled 2014-10-20: qty 118

## 2014-10-20 MED ORDER — LIDOCAINE HCL (PF) 1 % IJ SOLN
INTRAMUSCULAR | Status: AC
  Administered 2014-10-20: 5 mL via INTRADERMAL
  Filled 2014-10-20: qty 5

## 2014-10-20 MED ORDER — BACITRACIN ZINC 500 UNIT/GM EX OINT: 1.0000 "application " | TOPICAL_OINTMENT | Freq: Two times a day (BID) | CUTANEOUS | Status: DC

## 2014-10-20 MED ORDER — DOUBLE ANTIBIOTIC 500-10000 UNIT/GM EX OINT
TOPICAL_OINTMENT | Freq: Once | CUTANEOUS | Status: AC
  Administered 2014-10-20: 1 via TOPICAL
  Filled 2014-10-20: qty 6

## 2014-10-20 NOTE — ED Provider Notes (Addendum)
CSN: [id number]     Arrival date & time 10/20/14  1328 History   First MD Initiated Contact with Patient 10/20/14 1444     Chief Complaint  Patient presents with  . Hand Pain     (Consider location/radiation/quality/duration/timing/severity/associated sxs/prior Treatment) HPI Comments: 43 year old male, no significant immunocompromise state, presents with left thumb pain with purulent drainage swelling and bruising approximately 2 weeks after he hit his left thumb with a hammer. The pain is persistent, gradually worsening, not associated with fevers or chills. He has had some purulent drainage around the edge of the nail, they attempted to open up a pocket of pus with nail clippers a couple of days ago and had some purulent foul-smelling drainage.  Patient is a 43 y.o. male presenting with hand pain. The history is provided by the patient.  Hand Pain    Past Medical History  Diagnosis Date  . Arthritis   . Back pain   . Bipolar 1 disorder   . GERD (gastroesophageal reflux disease)   . Diverticulitis   . Blood clots in brain   . Vomiting    Past Surgical History  Procedure Laterality Date  . Brain surgery      after head injury, patient states had 2 large blood clots evacuated  . Colonoscopy with esophagogastroduodenoscopy (egd) N/A 12/05/2013    Procedure: COLONOSCOPY WITH ESOPHAGOGASTRODUODENOSCOPY (EGD);  Surgeon: Daneil Dolin, MD;  Location: [facility];  Service: Endoscopy;  Laterality: N/A;  1030   Family History  Problem Relation Age of Onset  . Adopted: Yes  . Colon cancer Neg Hx     adopted at 43 months old, unsure about GI history of parents    History  Substance Use Topics  . Smoking status: Former Smoker -- 0.75 packs/day for 25 years    Types: Cigars, Cigarettes    Quit date: 07/09/2014  . Smokeless tobacco: Current User    Types: Snuff  . Alcohol Use: Yes     Comment: "once in awhile"    Review of Systems  Constitutional: Negative for fever.   Gastrointestinal: Negative for vomiting.  Musculoskeletal: Positive for joint swelling.  Skin:       Laceration  Neurological: Negative for weakness and numbness.      Allergies  Review of patient's allergies indicates no known allergies.  Home Medications   Prior to Admission medications   Medication Sig Start Date End Date Taking? Authorizing Provider  Amino Acids (AMINO ACID PO) Take 3 capsules by mouth 2 (two) times daily with a meal.   Yes Historical Provider, MD  carbamazepine (TEGRETOL) 200 MG tablet Take 200 mg by mouth 2 (two) times daily.   Yes Historical Provider, MD  citalopram (CELEXA) 20 MG tablet Take 20 mg by mouth daily.   Yes Historical Provider, MD  CREATINE PO Take 6 capsules by mouth daily.   Yes Historical Provider, MD  dexlansoprazole (DEXILANT) 60 MG capsule Take 1 capsule (60 mg total) by mouth daily. 11/20/13  Yes Orvil Feil, NP  dicyclomine (BENTYL) 10 MG capsule Take 1 capsule (10 mg total) by mouth 4 (four) times daily -  before meals and at bedtime. 10/29/13  Yes Orvil Feil, NP  FLUoxetine (PROZAC) 20 MG capsule Take 20 mg by mouth daily.   Yes Historical Provider, MD  hydrOXYzine (ATARAX/VISTARIL) 50 MG tablet Take 50 mg by mouth 3 (three) times daily.   Yes Historical Provider, MD  ibuprofen (ADVIL,MOTRIN) 600 MG tablet Take 600 mg  by mouth every 6 (six) hours as needed for mild pain or moderate pain.   Yes Historical Provider, MD  methocarbamol (ROBAXIN) 500 MG tablet Take 500 mg by mouth every 8 (eight) hours as needed for muscle spasms.   Yes Historical Provider, MD  Multiple Vitamins-Minerals (MULTIVITAMINS THER. W/MINERALS) TABS tablet Take 1 tablet by mouth daily.   Yes Historical Provider, MD  rosuvastatin (CRESTOR) 20 MG tablet Take 20 mg by mouth at bedtime.   Yes Historical Provider, MD  traZODone (DESYREL) 150 MG tablet Take 300 mg by mouth at bedtime.   Yes Historical Provider, MD  bacitracin ointment Apply 1 application topically 2 (two)  times daily. 10/20/14   Shawn Acosta, MD  HYDROcodone-acetaminophen (NORCO/VICODIN) 5-325 MG per tablet Take 2 tablets by mouth every 4 (four) hours as needed. 10/20/14   Shawn Acosta, MD  hydrocortisone (ANUSOL-HC) 25 MG suppository Place 1 suppository (25 mg total) rectally every 12 (twelve) hours. Patient not taking: Reported on 10/20/2014 01/01/14   Orvil Feil, NP  naproxen (NAPROSYN) 500 MG tablet Take 1 tablet (500 mg total) by mouth 2 (two) times daily with a meal. 10/20/14   Shawn Acosta, MD  sulfamethoxazole-trimethoprim (SEPTRA DS) 800-160 MG per tablet Take 1 tablet by mouth every 12 (twelve) hours. 10/20/14   Shawn Acosta, MD   BP 128/69 mmHg  Pulse 87  Temp(Src) 98.9 F (37.2 C) (Oral)  Resp 20  Ht 6' (1.829 m)  Wt 165 lb (74.844 kg)  BMI 22.37 kg/m2  SpO2 98% Physical Exam  Constitutional: He appears well-developed and well-nourished. No distress.  HENT:  Head: Normocephalic and atraumatic.  Eyes: Conjunctivae are normal. No scleral icterus.  Cardiovascular: Normal rate, regular rhythm and intact distal pulses.   Pulmonary/Chest: Effort normal and breath sounds normal.  Musculoskeletal: He exhibits tenderness ( ttp in the L thumb, associated swelling around the edges of the nail - some subungual hematoma and purulence.  pad of finger is soft). He exhibits no edema.  Normal range of motino of the thumb, IP joint supple  Neurological: He is alert.  Normal sensation to the finger other than some numbness to the pad of the thumb on the L.  Skin: Skin is warm and dry. No rash noted. He is not diaphoretic.  Nursing note and vitals reviewed.   ED Course  NAIL REMOVAL Date/Time: 10/20/2014 3:00 PM Performed by: Shawn [name] Authorized by: Shawn [name] Consent: Verbal consent obtained. Risks and benefits: risks, benefits and alternatives were discussed Consent given by: patient Patient understanding: patient states understanding of the procedure being  performed Patient identity confirmed: verbally with patient Time out: Immediately prior to procedure a "time out" was called to verify the correct patient, procedure, equipment, support staff and site/side marked as required. Location: left hand Location details: left thumb Anesthesia: digital block Local anesthetic: lidocaine 1% without epinephrine Anesthetic total: 6 ml Patient sedated: no Preparation: skin prepped with Betadine Amount removed: complete Wedge excision of skin of nail fold: no Nail bed sutured: no Removed nail replaced and anchored: no Dressing: antibiotic ointment, Xeroform gauze and gauze roll Patient tolerance: Patient tolerated the procedure well with no immediate complications Comments: There was a good amount of dark thin blood and foul smelling purulence that has been removed when the nail was removed - the nail bed has no lacerations, no obvious drainable lesions.     (including critical care time) Labs Review Labs Reviewed - No data to display  Imaging Review Dg Finger Thumb Left  10/20/2014   CLINICAL DATA:  Hit the left thumb with sledgehammer 2 weeks ago. Painful and swollen. Dr. Sabra Heck removed the nail.  EXAM: LEFT THUMB 2+V  COMPARISON:  None.  FINDINGS: There is a comminuted fracture of the tuft of the distal phalanx of the thumb. There is associated soft tissue swelling. No radiopaque foreign body or soft tissue gas.  IMPRESSION: Comminuted tuft fracture of the thumb.   Electronically Signed   By: Shon Hale M.[name].   On: 10/20/2014 15:16      MDM   Final diagnoses:  Trauma  Hematoma, subungual, finger, left, initial encounter  Paronychia of left thumb    Digital block performed,   Xray s hows fractured phalanx,  No visible bone on exam, start abx, consult with hand. Updated tdap  Hand surgeon iDr. Caralyn Guile, agreeable to see pti n f/u. - will have f/u next week.  Pt in agreement.   Meds given in ED:  Medications  lidocaine (PF)  (XYLOCAINE) 1 % injection 5 mL (5 mLs Intradermal Given by Other 10/20/14 1500)  lidocaine (PF) (XYLOCAINE) 1 % injection 5 mL (5 mLs Intradermal Given by Other 10/20/14 1500)  povidone-iodine (BETADINE) 10 % external solution (  Given by Other 10/20/14 1500)  Tdap (BOOSTRIX) injection 0.5 mL (0.5 mLs Intramuscular Given 10/20/14 1515)  polymixin-bacitracin (POLYSPORIN) ointment (1 application Topical Given 10/20/14 1517)    New Prescriptions   BACITRACIN OINTMENT    Apply 1 application topically 2 (two) times daily.   HYDROCODONE-ACETAMINOPHEN (NORCO/VICODIN) 5-325 MG PER TABLET    Take 2 tablets by mouth every 4 (four) hours as needed.   NAPROXEN (NAPROSYN) 500 MG TABLET    Take 1 tablet (500 mg total) by mouth 2 (two) times daily with a meal.   SULFAMETHOXAZOLE-TRIMETHOPRIM (SEPTRA DS) 800-160 MG PER TABLET    Take 1 tablet by mouth every 12 (twelve) hours.      Shawn Acosta, MD 10/20/14 1547  Shawn Acosta, MD 10/20/14 [phone]

## 2014-10-20 NOTE — Discharge Instructions (Signed)
You nail will take several months to grow back in - please take the antibiotics as prescribed - You have a small broken bone at the tip of your finger- this usually does not require surgery, you can follow up this coming week.  Please call your doctor for a followup appointment within 24-48 hours. When you talk to your doctor please let them know that you were seen in the emergency department and have them acquire all of your records so that they can discuss the findings with you and formulate a treatment plan to fully care for your new and ongoing problems.

## 2014-10-20 NOTE — ED Notes (Signed)
Patient c/o left thumb pain. Per patient "smashed thumb with 2 lb hammer 2 weeks ago." Patient reports finger numbness. Finger nail yellow and black in color. Finger red, tender, and warm to touch. Patient reports foul odor from thumb but denies any drainage. Denies any fevers.

## 2015-01-22 ENCOUNTER — Encounter: Payer: Self-pay | Admitting: Gastroenterology

## 2015-01-22 ENCOUNTER — Telehealth: Payer: Self-pay | Admitting: Internal Medicine

## 2015-01-22 ENCOUNTER — Ambulatory Visit: Payer: Self-pay | Admitting: Gastroenterology

## 2015-01-22 NOTE — Telephone Encounter (Signed)
PATIENT WAS A NO SHOW 01/22/15 AND LETTER SENT

## 2015-07-03 ENCOUNTER — Ambulatory Visit (INDEPENDENT_AMBULATORY_CARE_PROVIDER_SITE_OTHER): Payer: Self-pay | Admitting: Gastroenterology

## 2015-07-03 ENCOUNTER — Encounter: Payer: Self-pay | Admitting: Gastroenterology

## 2015-07-03 VITALS — BP 105/65 | HR 59 | Temp 97.0°F | Ht 70.0 in | Wt 171.4 lb

## 2015-07-03 DIAGNOSIS — R195 Other fecal abnormalities: Secondary | ICD-10-CM

## 2015-07-03 DIAGNOSIS — R112 Nausea with vomiting, unspecified: Secondary | ICD-10-CM

## 2015-07-03 DIAGNOSIS — R9389 Abnormal findings on diagnostic imaging of other specified body structures: Secondary | ICD-10-CM

## 2015-07-03 DIAGNOSIS — K2100 Gastro-esophageal reflux disease with esophagitis, without bleeding: Secondary | ICD-10-CM

## 2015-07-03 DIAGNOSIS — R1033 Periumbilical pain: Secondary | ICD-10-CM

## 2015-07-03 DIAGNOSIS — R938 Abnormal findings on diagnostic imaging of other specified body structures: Secondary | ICD-10-CM

## 2015-07-03 MED ORDER — DEXLANSOPRAZOLE 60 MG PO CPDR: 60.0000 mg | DELAYED_RELEASE_CAPSULE | Freq: Every day | ORAL | Status: DC

## 2015-07-03 NOTE — Patient Instructions (Addendum)
1. Bring by copy of CDs for review. 2. Please have stool studies and labs done if able. They may be able to help you out at the health department or you may be able to wait until your patient assistance has been approved. 3. Start Dexilant once daily before breakfast. Samples and patient assistance forms provided.  4. Call and let me know how you are doing in one week.

## 2015-07-03 NOTE — Progress Notes (Signed)
Primary Care Physician: No PCP Per Patient  Primary Gastroenterologist:  Garfield Cornea, MD   Chief Complaint  Patient presents with  . medication is not working    HPI: Shawn [name] is a 44 y.o. male here follow-up of GERD. Medication not working well. Currently on Prilosec 40 mg daily. Previously on Nexium for several years but it stopped working as well. Possibly try Protonix but is not sure. Dexilant worked well before but he's been off medication for about a year due to lack of insurance. Heartburn depending on what he eats. Most days he has vomiting which he feels like is due to reflux. Denies hematemesis. Complains of intermittent mid abdominal pain/gas-like/cramping. Bowel movements go from 1 daily to 4-5 times daily, soft to loose. More recent change in stools but occuring for months.  No melena or rectal bleeding. Denies nocturnal BMs.    Current Outpatient Prescriptions  Medication Sig Dispense Refill  . Albuterol Sulfate (VENTOLIN HFA IN) Inhale into the lungs as needed.    . budesonide-formoterol (SYMBICORT) 160-4.5 MCG/ACT inhaler Inhale 2 puffs into the lungs 2 (two) times daily.    . cetirizine (ZYRTEC) 10 MG tablet Take 10 mg by mouth daily.    . DULoxetine (CYMBALTA) 30 MG capsule Take 30 mg by mouth daily.    Marland Kitchen FLUoxetine (PROZAC) 20 MG capsule Take 40 mg by mouth daily.     . hydrALAZINE (APRESOLINE) 50 MG tablet Take by mouth.    Marland Kitchen HYDROcodone-acetaminophen (NORCO) 10-325 MG per tablet Take 1 tablet by mouth every 6 (six) hours as needed.    . lamoTRIgine (LAMICTAL) 25 MG tablet Take 25 mg by mouth daily.    Marland Kitchen omeprazole (PRILOSEC) 40 MG capsule Take 40 mg by mouth daily.    . traMADol (ULTRAM) 50 MG tablet Take by mouth every 8 (eight) hours as needed.    . traZODone (DESYREL) 150 MG tablet Take 300 mg by mouth at bedtime.     No current facility-administered medications for this visit.    Allergies as of 07/03/2015  . (No Known Allergies)   Past  Medical History  Diagnosis Date  . Arthritis   . Back pain   . Bipolar 1 disorder   . GERD (gastroesophageal reflux disease)   . Diverticulitis   . Blood clots in brain   . Vomiting    Past Surgical History  Procedure Laterality Date  . Brain surgery      after head injury, patient states had 2 large blood clots evacuated  . Colonoscopy with esophagogastroduodenoscopy (egd) N/A 12/05/2013    ZOX:WRUE erosive relfux/HH/melanosis coli/colonic diverticulosis. tubulovillous adenoma removed. next tcs 11/2016   Family History  Problem Relation Age of Onset  . Adopted: Yes  . Colon cancer Neg Hx     adopted at 34 months old, unsure about GI history of parents    Social History   Social History  . Marital Status: Married    Spouse Name: N/A  . Number of Children: N/A  . Years of Education: N/A   Social History Main Topics  . Smoking status: Former Smoker -- 0.75 packs/day for 25 years    Types: Cigars, Cigarettes    Quit date: 07/09/2014  . Smokeless tobacco: Current User    Types: Snuff  . Alcohol Use: Yes     Comment: "once in awhile"  . Drug Use: No  . Sexual Activity: Not Asked   Other Topics Concern  . None  Social History Narrative     ROS:  General: Negative for anorexia, weight loss, fever, chills, fatigue, weakness. ENT: Negative for hoarseness, difficulty swallowing , nasal congestion. CV: Negative for chest pain, angina, palpitations, dyspnea on exertion, peripheral edema.  Respiratory: Negative for dyspnea at rest, dyspnea on exertion, cough, sputum, wheezing.  GI: See history of present illness. GU:  Negative for dysuria, hematuria, urinary incontinence, urinary frequency, nocturnal urination.  Endo: Negative for unusual weight change.    Physical Examination:   BP 105/65 mmHg  Pulse 59  Temp(Src) 97 F (36.1 C) (Oral)  Ht 5\' 10"  (1.778 m)  Wt 171 lb 6.4 oz (77.747 kg)  BMI 24.59 kg/m2  General: Well-nourished, well-developed in no acute  distress.  Eyes: No icterus. Mouth: Oropharyngeal mucosa moist and pink , no lesions erythema or exudate. Lungs: Clear to auscultation bilaterally.  Heart: Regular rate and rhythm, no murmurs rubs or gallops.  Abdomen: Bowel sounds are normal, nontender, nondistended, no hepatosplenomegaly or masses, no abdominal bruits or hernia , no rebound or guarding.   Extremities: No lower extremity edema. No clubbing or deformities. Neuro: Alert and oriented x 4   Skin: Warm and dry, no jaundice.   Psych: Alert and cooperative, normal mood and affect.    Imaging Studies: No results found.

## 2015-07-11 NOTE — Assessment & Plan Note (Signed)
Patient reports being due for follow up CT (PET?). Plans to bring by CDs/reports from most recent scans done at outside facility. Based on findings, he may require pulmonology consult. Await CDs.

## 2015-07-11 NOTE — Assessment & Plan Note (Signed)
GERD symptoms, N/V off PPI. Restart Dexilant once daily. Samples and patient assistance forms provided.

## 2015-07-11 NOTE — Assessment & Plan Note (Signed)
H/O Cdiff in past. Several month h/o recurrent loose stools. Check labs, celiac screen, stool studies.

## 2015-07-15 NOTE — Progress Notes (Signed)
No pcp per patient 

## 2015-08-13 NOTE — Progress Notes (Signed)
Patient reports being due for follow up CT Chest (PET?). Had abnormal Chest CT 01/2014 and states this was followed up on at outside facility since then. At time of OV, he said he would bring by CDs/reports from most recent scans done at outside facility. Based on findings, he may require pulmonology consult.  If patient would like my opinion, he should bring CDs/reports ASAP. Otherwise, I would advise him to follow up with his PCP.

## 2015-08-17 ENCOUNTER — Encounter (HOSPITAL_COMMUNITY): Payer: Self-pay | Admitting: Emergency Medicine

## 2015-08-17 ENCOUNTER — Emergency Department (HOSPITAL_COMMUNITY): Payer: Self-pay

## 2015-08-17 ENCOUNTER — Emergency Department (HOSPITAL_COMMUNITY)
Admission: EM | Admit: 2015-08-17 | Discharge: 2015-08-17 | Disposition: A | Discharge status: Home / Self Care | Payer: Self-pay | Attending: Emergency Medicine | Admitting: Emergency Medicine

## 2015-08-17 DIAGNOSIS — K219 Gastro-esophageal reflux disease without esophagitis: Secondary | ICD-10-CM | POA: Insufficient documentation

## 2015-08-17 DIAGNOSIS — J449 Chronic obstructive pulmonary disease, unspecified: Secondary | ICD-10-CM

## 2015-08-17 DIAGNOSIS — Z79899 Other long term (current) drug therapy: Secondary | ICD-10-CM | POA: Insufficient documentation

## 2015-08-17 DIAGNOSIS — J441 Chronic obstructive pulmonary disease with (acute) exacerbation: Secondary | ICD-10-CM | POA: Insufficient documentation

## 2015-08-17 DIAGNOSIS — Z8679 Personal history of other diseases of the circulatory system: Secondary | ICD-10-CM | POA: Insufficient documentation

## 2015-08-17 DIAGNOSIS — M1990 Unspecified osteoarthritis, unspecified site: Secondary | ICD-10-CM | POA: Insufficient documentation

## 2015-08-17 DIAGNOSIS — F319 Bipolar disorder, unspecified: Secondary | ICD-10-CM | POA: Insufficient documentation

## 2015-08-17 DIAGNOSIS — Z87891 Personal history of nicotine dependence: Secondary | ICD-10-CM | POA: Insufficient documentation

## 2015-08-17 DIAGNOSIS — Z7951 Long term (current) use of inhaled steroids: Secondary | ICD-10-CM | POA: Insufficient documentation

## 2015-08-17 LAB — CBC WITH DIFFERENTIAL/PLATELET
BASOS ABS: 0.1 10*3/uL (ref 0.0–0.1)
BASOS PCT: 1 %
EOS PCT: 2 %
Eosinophils Absolute: 0.2 10*3/uL (ref 0.0–0.7)
HCT: 44.5 % (ref 39.0–52.0)
Hemoglobin: 15.2 g/dL (ref 13.0–17.0)
Lymphocytes Relative: 21 %
Lymphs Abs: 1.8 10*3/uL (ref 0.7–4.0)
MCH: 32.6 pg (ref 26.0–34.0)
MCHC: 34.2 g/dL (ref 30.0–36.0)
MCV: 95.5 fL (ref 78.0–100.0)
MONO ABS: 0.5 10*3/uL (ref 0.1–1.0)
MONOS PCT: 6 %
Neutro Abs: 6.2 10*3/uL (ref 1.7–7.7)
Neutrophils Relative %: 70 %
PLATELETS: 183 10*3/uL (ref 150–400)
RBC: 4.66 MIL/uL (ref 4.22–5.81)
RDW: 14.1 % (ref 11.5–15.5)
WBC: 8.6 10*3/uL (ref 4.0–10.5)

## 2015-08-17 LAB — BASIC METABOLIC PANEL
ANION GAP: 10 (ref 5–15)
BUN: 13 mg/dL (ref 6–20)
CALCIUM: 9.8 mg/dL (ref 8.9–10.3)
CO2: 29 mmol/L (ref 22–32)
CREATININE: 0.94 mg/dL (ref 0.61–1.24)
Chloride: 102 mmol/L (ref 101–111)
GLUCOSE: 107 mg/dL — AB (ref 65–99)
Potassium: 4.2 mmol/L (ref 3.5–5.1)
Sodium: 141 mmol/L (ref 135–145)

## 2015-08-17 MED ORDER — IPRATROPIUM-ALBUTEROL 0.5-2.5 (3) MG/3ML IN SOLN
3.0000 mL | Freq: Once | RESPIRATORY_TRACT | Status: AC
  Administered 2015-08-17: 3 mL via RESPIRATORY_TRACT
  Filled 2015-08-17: qty 3

## 2015-08-17 MED ORDER — ALBUTEROL SULFATE HFA 108 (90 BASE) MCG/ACT IN AERS
1.0000 | INHALATION_SPRAY | RESPIRATORY_TRACT | Status: DC | PRN
  Administered 2015-08-17: 2 via RESPIRATORY_TRACT
  Filled 2015-08-17: qty 6.7

## 2015-08-17 MED ORDER — AEROCHAMBER Z-STAT PLUS/MEDIUM MISC
Status: AC
  Filled 2015-08-17: qty 1

## 2015-08-17 MED ORDER — PREDNISONE 10 MG PO TABS: 40.0000 mg | ORAL_TABLET | Freq: Every day | ORAL | Status: DC

## 2015-08-17 MED ORDER — PREDNISONE 50 MG PO TABS
60.0000 mg | ORAL_TABLET | Freq: Once | ORAL | Status: AC
  Administered 2015-08-17: 60 mg via ORAL
  Filled 2015-08-17 (×2): qty 1

## 2015-08-17 NOTE — Discharge Instructions (Signed)
Chronic Obstructive Pulmonary Disease Chronic obstructive pulmonary disease (COPD) is a common lung condition in which airflow from the lungs is limited. COPD is a general term that can be used to describe many different lung problems that limit airflow, including both chronic bronchitis and emphysema. If you have COPD, your lung function will probably never return to normal, but there are measures you can take to improve lung function and make yourself feel better. CAUSES   Smoking (common).  Exposure to secondhand smoke.  Genetic problems.  Chronic inflammatory lung diseases or recurrent infections. SYMPTOMS  Shortness of breath, especially with physical activity.  Deep, persistent (chronic) cough with a large amount of thick mucus.  Wheezing.  Rapid breaths (tachypnea).  Gray or bluish discoloration (cyanosis) of the skin, especially in your fingers, toes, or lips.  Fatigue.  Weight loss.  Frequent infections or episodes when breathing symptoms become much worse (exacerbations).  Chest tightness. DIAGNOSIS Your health care provider will take a medical history and perform a physical examination to diagnose COPD. Additional tests for COPD may include:  Lung (pulmonary) function tests.  Chest X-ray.  CT scan.  Blood tests. TREATMENT  Treatment for COPD may include:  Inhaler and nebulizer medicines. These help manage the symptoms of COPD and make your breathing more comfortable.  Supplemental oxygen. Supplemental oxygen is only helpful if you have a low oxygen level in your blood.  Exercise and physical activity. These are beneficial for nearly all people with COPD.  Lung surgery or transplant.  Nutrition therapy to gain weight, if you are underweight.  Pulmonary rehabilitation. This may involve working with a team of health care providers and specialists, such as respiratory, occupational, and physical therapists. HOME CARE INSTRUCTIONS  Take all medicines  (inhaled or pills) as directed by your health care provider.  Avoid over-the-counter medicines or cough syrups that dry up your airway (such as antihistamines) and slow down the elimination of secretions unless instructed otherwise by your health care provider.  If you are a smoker, the most important thing that you can do is stop smoking. Continuing to smoke will cause further lung damage and breathing trouble. Ask your health care provider for help with quitting smoking. He or she can direct you to community resources or hospitals that provide support.  Avoid exposure to irritants such as smoke, chemicals, and fumes that aggravate your breathing.  Use oxygen therapy and pulmonary rehabilitation if directed by your health care provider. If you require home oxygen therapy, ask your health care provider whether you should purchase a pulse oximeter to measure your oxygen level at home.  Avoid contact with individuals who have a contagious illness.  Avoid extreme temperature and humidity changes.  Eat healthy foods. Eating smaller, more frequent meals and resting before meals may help you maintain your strength.  Stay active, but balance activity with periods of rest. Exercise and physical activity will help you maintain your ability to do things you want to do.  Preventing infection and hospitalization is very important when you have COPD. Make sure to receive all the vaccines your health care provider recommends, especially the pneumococcal and influenza vaccines. Ask your health care provider whether you need a pneumonia vaccine.  Learn and use relaxation techniques to manage stress.  Learn and use controlled breathing techniques as directed by your health care provider. Controlled breathing techniques include:  Pursed lip breathing. Start by breathing in (inhaling) through your nose for 1 second. Then, purse your lips as if you were  going to whistle and breathe out (exhale) through the  pursed lips for 2 seconds.  Diaphragmatic breathing. Start by putting one hand on your abdomen just above your waist. Inhale slowly through your nose. The hand on your abdomen should move out. Then purse your lips and exhale slowly. You should be able to feel the hand on your abdomen moving in as you exhale.  Learn and use controlled coughing to clear mucus from your lungs. Controlled coughing is a series of short, progressive coughs. The steps of controlled coughing are:  Lean your head slightly forward.  Breathe in deeply using diaphragmatic breathing.  Try to hold your breath for 3 seconds.  Keep your mouth slightly open while coughing twice.  Spit any mucus out into a tissue.  Rest and repeat the steps once or twice as needed. SEEK MEDICAL CARE IF:  You are coughing up more mucus than usual.  There is a change in the color or thickness of your mucus.  Your breathing is more labored than usual.  Your breathing is faster than usual. SEEK IMMEDIATE MEDICAL CARE IF:  You have shortness of breath while you are resting.  You have shortness of breath that prevents you from:  Being able to talk.  Performing your usual physical activities.  You have chest pain lasting longer than 5 minutes.  Your skin color is more cyanotic than usual.  You measure low oxygen saturations for longer than 5 minutes with a pulse oximeter. MAKE SURE YOU:  Understand these instructions.  Will watch your condition.  Will get help right away if you are not doing well or get worse.   This information is not intended to replace advice given to you by your health care provider. Make sure you discuss any questions you have with your health care provider.   Document Released: 08/04/2005 Document Revised: 11/15/2014 Document Reviewed: 06/21/2013 Elsevier Interactive Patient Education 2016 Else[employer]  Pulmonary Nodule  This needs to be followed up outpatient with more definitive testing for  evaluation of what is causing the lung nodules. A pulmonary nodule is a small, round growth of tissue in the lung. Pulmonary nodules can range in size from less than 1/5 inch (4 mm) to a little bigger than an inch (25 mm). Most pulmonary nodules are detected when imaging tests of the lung are being performed for a different problem. Pulmonary nodules are usually not cancerous (benign). However, some pulmonary nodules are cancerous (malignant). Follow-up treatment or testing is based on the size of the pulmonary nodule and your risk of getting lung cancer.  CAUSES Benign pulmonary nodules can be caused by various things. Some of the causes include:   Bacterial, fungal, or viral infections. This is usually an old infection that is no longer active, but it can sometimes be a current, active infection.  A benign mass of tissue.  Inflammation from conditions such as rheumatoid arthritis.   Abnormal blood vessels in the lungs. Malignant pulmonary nodules can result from lung cancer or from cancers that spread to the lung from other places in the body. SIGNS AND SYMPTOMS Pulmonary nodules usually do not cause symptoms. DIAGNOSIS Most often, pulmonary nodules are found incidentally when an X-ray or CT scan is performed to look for some other problem in the lung area. To help determine whether a pulmonary nodule is benign or malignant, your health care provider will take a medical history and order a variety of tests. Tests done may include:   Blood tests.  A skin test called a tuberculin test. This test is used to determine if you have been exposed to the germ that causes tuberculosis.   Chest X-rays. If possible, a new X-ray may be compared with X-rays you have had in the past.   CT scan. This test shows smaller pulmonary nodules more clearly than an X-ray.   Positron emission tomography (PET) scan. In this test, a safe amount of a radioactive substance is injected into the bloodstream. Then,  the scan takes a picture of the pulmonary nodule. The radioactive substance is eliminated from your body in your urine.   Biopsy. A tiny piece of the pulmonary nodule is removed so it can be checked under a microscope. TREATMENT  Pulmonary nodules that are benign normally do not require any treatment because they usually do not cause symptoms or breathing problems. Your health care provider may want to monitor the pulmonary nodule through follow-up CT scans. The frequency of these CT scans will vary based on the size of the nodule and the risk factors for lung cancer. For example, CT scans will need to be done more frequently if the pulmonary nodule is larger and if you have a history of smoking and a family history of cancer. Further testing or biopsies may be done if any follow-up CT scan shows that the size of the pulmonary nodule has increased. HOME CARE INSTRUCTIONS  Only take over-the-counter or prescription medicines as directed by your health care provider.  Keep all follow-up appointments with your health care provider. SEEK MEDICAL CARE IF:  You have trouble breathing when you are active.   You feel sick or unusually tired.   You do not feel like eating.   You lose weight without trying to.   You develop chills or night sweats.  SEEK IMMEDIATE MEDICAL CARE IF:  You cannot catch your breath, or you begin wheezing.   You cannot stop coughing.   You cough up blood.   You become dizzy or feel like you are going to pass out.   You have sudden chest pain.   You have a fever or persistent symptoms for more than 2-3 days.   You have a fever and your symptoms suddenly get worse. MAKE SURE YOU:  Understand these instructions.  Will watch your condition.  Will get help right away if you are not doing well or get worse.   This information is not intended to replace advice given to you by your health care provider. Make sure you discuss any questions you have with  your health care provider.   Document Released: 08/22/2009 Document Revised: 06/27/2013 Document Reviewed: 04/16/2013 Elsevier Interactive Patient Education [employer].

## 2015-08-17 NOTE — ED Notes (Signed)
Pt states that he has nodules on his lungs and has been coughing and increasingly sob over the past month.  States that he needs a PET scan but does not have insurance and has not followed up with his care.

## 2015-08-17 NOTE — ED Notes (Signed)
MD at bedside. 

## 2015-08-17 NOTE — ED Notes (Signed)
Instructed pt regarding d/c instructions. Verbalizes understanding. Pt unable to sign due to key pad not working.

## 2015-08-17 NOTE — ED Provider Notes (Signed)
CSN: [id number]     Arrival date & time 08/17/15  1426 History   First MD Initiated Contact with Patient 08/17/15 1437     Chief Complaint  Patient presents with  . Cough     (Consider location/radiation/quality/duration/timing/severity/associated sxs/prior Treatment) HPI Comments: 44 y.o. Male smoker with history of COPD presents for shortness of breath and cough.  The patient states that for the last month he has had a cough and that it is worse at night and that his breathing sounds like the fizz of a soda can.  He denies fever.  He denies chest pain.  No leg swelling or pain.  No known sick contacts.  He says he has a history of lung nodules for which he needs a PET scan but has been unable to get medicare and so has not followed up for this issue.  Patient is a 44 y.o. male presenting with cough.  Cough Associated symptoms: shortness of breath and wheezing   Associated symptoms: no chest pain, no chills, no fever, no headaches, no myalgias, no rash and no rhinorrhea     Past Medical History  Diagnosis Date  . Arthritis   . Back pain   . Bipolar 1 disorder ([place])   . GERD (gastroesophageal reflux disease)   . Diverticulitis   . Blood clots in brain   . Vomiting    Past Surgical History  Procedure Laterality Date  . Brain surgery      after head injury, patient states had 2 large blood clots evacuated  . Colonoscopy with esophagogastroduodenoscopy (egd) N/A 12/05/2013    [facility]:URKY erosive relfux/HH/melanosis coli/colonic diverticulosis. tubulovillous adenoma removed. next tcs 11/2016   Family History  Problem Relation Age of Onset  . Adopted: Yes  . Colon cancer Neg Hx     adopted at 44 months old, unsure about GI history of parents    Social History  Substance Use Topics  . Smoking status: Former Smoker -- 0.75 packs/day for 25 years    Types: Cigars, Cigarettes    Quit date: 07/09/2014  . Smokeless tobacco: Current User    Types: Snuff  . Alcohol Use: Yes   Comment: "once in awhile"    Review of Systems  Constitutional: Negative for fever, chills, appetite change and fatigue.  HENT: Negative for congestion, postnasal drip and rhinorrhea.   Eyes: Negative for pain and redness.  Respiratory: Positive for cough, shortness of breath and wheezing. Negative for chest tightness.   Cardiovascular: Negative for chest pain, palpitations and leg swelling.  Gastrointestinal: Negative for nausea, vomiting, abdominal pain, diarrhea and constipation.  Genitourinary: Negative for dysuria, urgency and hematuria.  Musculoskeletal: Negative for myalgias and back pain.  Skin: Negative for rash.  Neurological: Negative for dizziness, weakness, light-headedness and headaches.  Hematological: Does not bruise/bleed easily.      Allergies  Review of patient's allergies indicates no known allergies.  Home Medications   Prior to Admission medications   Medication Sig Start Date End Date Taking? Authorizing Provider  albuterol (PROVENTIL HFA;VENTOLIN HFA) 108 (90 BASE) MCG/ACT inhaler Inhale 1-2 puffs into the lungs every 6 (six) hours as needed for wheezing or shortness of breath.   Yes Historical Provider, MD  cetirizine (ZYRTEC) 10 MG tablet Take 10 mg by mouth daily.   Yes Historical Provider, MD  dexlansoprazole (DEXILANT) 60 MG capsule Take 1 capsule (60 mg total) by mouth daily. 07/03/15  Yes Mahala Menghini, PA-C  DULoxetine (CYMBALTA) 30 MG capsule Take 30 mg by  mouth daily.   Yes Historical Provider, MD  FLUoxetine (PROZAC) 20 MG capsule Take 40 mg by mouth daily.    Yes Historical Provider, MD  hydrALAZINE (APRESOLINE) 50 MG tablet Take 50 mg by mouth 2 (two) times daily.    Yes Historical Provider, MD  lamoTRIgine (LAMICTAL) 25 MG tablet Take 25 mg by mouth daily.   Yes Historical Provider, MD  traMADol (ULTRAM) 50 MG tablet Take 50 mg by mouth every 8 (eight) hours as needed for moderate pain or severe pain.    Yes Historical Provider, MD  traZODone  (DESYREL) 150 MG tablet Take 300 mg by mouth at bedtime.   Yes Historical Provider, MD  budesonide-formoterol (SYMBICORT) 160-4.5 MCG/ACT inhaler Inhale 2 puffs into the lungs 2 (two) times daily.    Historical Provider, MD  predniSONE (DELTASONE) 10 MG tablet Take 4 tablets (40 mg total) by mouth daily. 08/17/15   Harvel Quale, MD   BP 103/67 mmHg  Pulse 66  Temp(Src) 97.9 F (36.6 C) (Oral)  Resp 18  Ht 5\' 10"  (1.778 m)  Wt 175 lb (79.379 kg)  BMI 25.11 kg/m2  SpO2 99% Physical Exam  Constitutional: He is oriented to person, place, and time. He appears well-developed and well-nourished. No distress.  HENT:  Head: Normocephalic and atraumatic.  Right Ear: External ear normal.  Left Ear: External ear normal.  Mouth/Throat: Oropharynx is clear and moist. No oropharyngeal exudate.  Eyes: EOM are normal. Pupils are equal, round, and reactive to light.  Neck: Normal range of motion. Neck supple.  Cardiovascular: Normal rate, regular rhythm, normal heart sounds and intact distal pulses.   No murmur heard. Pulmonary/Chest: Effort normal. No respiratory distress. He has wheezes (few, mild with bronchospastic cough). He has no rales.  Abdominal: Soft. He exhibits no distension. There is no tenderness.  Musculoskeletal: He exhibits no edema.  Neurological: He is alert and oriented to person, place, and time.  Skin: Skin is warm and dry. No rash noted. He is not diaphoretic.  Vitals reviewed.   ED Course  Procedures (including critical care time) Labs Review Labs Reviewed  BASIC METABOLIC PANEL - Abnormal; Notable for the following:    Glucose, Bld 107 (*)    All other components within normal limits  CBC WITH DIFFERENTIAL/PLATELET    Imaging Review Dg Chest 2 View  08/17/2015   CLINICAL DATA:  Worsening nonproductive cough and shortness of breath for 1 month.  EXAM: CHEST  2 VIEW  COMPARISON:  CT chest 01/23/2014; chest radiograph 08/07/2013  FINDINGS: Stable cardiac and  mediastinal contours. No large consolidative pulmonary opacities. Grossly unchanged bilateral mid and lower lung coarse interstitial opacities. No pleural effusion or pneumothorax. Mid thoracic spine degenerative changes.  IMPRESSION: Unchanged mid and lower lung interstitial opacities which are nonspecific however may be secondary to sarcoidosis. Acute infectious process is not entirely excluded.  Unless performed at different institution, the previously recommended chest CT follow-up exams have not been performed as discussed on CT chest 01/23/2014. As per this recommendation, noncontrast chest CT in the non acute setting is recommended for followup.   Electronically Signed   By: Lovey Newcomer M.D.   On: 08/17/2015 16:29   I have personally reviewed and evaluated these images and lab results as part of my medical decision-making.   EKG Interpretation None      MDM  Patient seen and evaluated in stable condition.  History consistent with COPD related symptoms.  Patient well appearing.  Symptoms improved with  prednisone and breathing treatment.  Patient provided with albuterol inhaler with spacer.  His chest xray was unchanged from prior.  When discussed with him he was aware of need for follow up for further evaluation of lung nodules.  Patient was discharged home in stable condition with instruction to use the inhaler with spacer as needed and with a prescription for prednisone. Final diagnoses:  Chronic obstructive pulmonary disease, unspecified COPD type ([place])    1. COPD with exacerbation  2. Lung nodules    Harvel Quale, MD 08/17/15 2203

## 2015-08-26 ENCOUNTER — Encounter: Payer: Self-pay | Admitting: Nurse Practitioner

## 2015-08-26 ENCOUNTER — Ambulatory Visit (INDEPENDENT_AMBULATORY_CARE_PROVIDER_SITE_OTHER): Payer: Self-pay | Admitting: Nurse Practitioner

## 2015-08-26 VITALS — BP 99/57 | HR 54 | Temp 97.6°F | Ht 70.0 in | Wt 179.4 lb

## 2015-08-26 DIAGNOSIS — K2100 Gastro-esophageal reflux disease with esophagitis, without bleeding: Secondary | ICD-10-CM

## 2015-08-26 DIAGNOSIS — R938 Abnormal findings on diagnostic imaging of other specified body structures: Secondary | ICD-10-CM

## 2015-08-26 DIAGNOSIS — R9389 Abnormal findings on diagnostic imaging of other specified body structures: Secondary | ICD-10-CM

## 2015-08-26 DIAGNOSIS — R195 Other fecal abnormalities: Secondary | ICD-10-CM

## 2015-08-26 NOTE — Patient Instructions (Signed)
1. File your assistance paperwork for Crohn, the lab, Dexilant. 2. He can take a little bit of Imodium to help with your looser stools. If he developed any abdominal pain, fever, or worsening symptoms stop taking the Imodium and be seen. 3. Return for follow-up in 3 months.

## 2015-08-26 NOTE — Progress Notes (Signed)
Referring Provider: No ref. provider found Primary Care Physician:  No PCP Per Patient Primary GI:  Dr. Gala Romney  Chief Complaint  Patient presents with  . Diarrhea    HPI:   44 year old male presents for follow-up on GERD, loose stools. Last office visit he was restarted on Dexilant with samples and patient assistance forms. Previous he failed multiple other PPIs including Prilosec, Nexium, Protonix. At that time we are also waiting for him to bring outside CT report for possible referral to pulmonology. One month after previous visit he had not brought them and was recommended he follow up with his PCP. Labs ordered last office visit do not appear to been completed. 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  Today he states he did have his labs drawn or stool studies done because of lack of insurance. He has [facility] assist papwerwork with him today and is in the process of applying for medicaid. His GERD symptoms are improved with Dexilant, currently out of samples and Dexilant assist papwerwork pending. He is currently having a bowel movement 2-4 times a day. Stools are "loose but not diarrhea" seems most consistent with 5-6. Denies abdominal pain. Occasional N/V, improved since starting Dexilant. Denies hematochezia and melena. Denies fever, chills, unintentional weight loss. Denies chest pain, dyspnea worse then baseline, dizziness, lightheadedness, syncope, near syncope. Denies any other upper or lower GI symptoms. States his chest CT shows lung nodules and is needed a PET scan, but waiting for insurance and patient assist.   Past Medical History  Diagnosis Date  . Arthritis   . Back pain   . Bipolar 1 disorder ([place])   . GERD  (gastroesophageal reflux disease)   . Diverticulitis   . Blood clots in brain   . Vomiting     Past Surgical History  Procedure Laterality Date  . Brain surgery      after head injury, patient states had 2 large blood clots evacuated  . Colonoscopy with esophagogastroduodenoscopy (egd) N/A 12/05/2013    [facility]:LOVF erosive relfux/HH/melanosis coli/colonic diverticulosis. tubulovillous adenoma removed. next tcs 11/2016    Current Outpatient Prescriptions  Medication Sig Dispense Refill  . albuterol (PROVENTIL HFA;VENTOLIN HFA) 108 (90 BASE) MCG/ACT inhaler Inhale 1-2 puffs into the lungs every 6 (six) hours as needed for wheezing or shortness of breath.    . budesonide-formoterol (SYMBICORT) 160-4.5 MCG/ACT inhaler Inhale 2 puffs into the lungs 2 (two) times daily.    . cetirizine (ZYRTEC) 10 MG tablet Take 10 mg by mouth daily.    . DULoxetine (CYMBALTA) 30 MG capsule Take 30 mg by mouth daily.    Marland Kitchen FLUoxetine (PROZAC) 20 MG capsule Take 40 mg by mouth daily.     . hydrALAZINE (APRESOLINE) 50 MG tablet Take 50 mg by mouth 2 (two) times daily.     Marland Kitchen lamoTRIgine (LAMICTAL) 25 MG tablet Take 25 mg by mouth daily.    . traMADol (ULTRAM) 50 MG tablet Take 50 mg by mouth every 8 (eight) hours as needed for moderate pain or severe pain.     . traZODone (DESYREL) 150 MG tablet Take 300 mg by mouth at bedtime.    Marland Kitchen dexlansoprazole (DEXILANT) 60 MG capsule Take 1 capsule (60 mg total) by mouth daily. (Patient not taking: Reported on 08/26/2015) 20 capsule 0  .  predniSONE (DELTASONE) 10 MG tablet Take 4 tablets (40 mg total) by mouth daily. (Patient not taking: Reported on 08/26/2015) 16 tablet 0   No current facility-administered medications for this visit.    Allergies as of 08/26/2015  . (No Known Allergies)    Family History  Problem Relation Age of Onset  . Adopted: Yes  . Colon cancer Neg Hx     adopted at 14 months old, unsure about GI history of parents     Social History   Social  History  . Marital Status: Married    Spouse Name: N/A  . Number of Children: N/A  . Years of Education: N/A   Social History Main Topics  . Smoking status: Former Smoker -- 0.75 packs/day for 25 years    Types: Cigars, Cigarettes    Quit date: 07/09/2014  . Smokeless tobacco: Current User    Types: Snuff  . Alcohol Use: Yes     Comment: "once in awhile"  . Drug Use: No  . Sexual Activity: Not Asked   Other Topics Concern  . None   Social History Narrative    Review of Systems: General: Negative for anorexia, weight loss, fever, chills, fatigue, weakness. Eyes: Negative for vision changes.  ENT: Negative for hoarseness. CV: Negative for chest pain, angina, palpitations, peripheral edema.  Respiratory: Negative for dyspnea at rest, cough, sputum, wheezing.  GI: See history of present illness. Endo: Negative for unusual weight change.    Physical Exam: BP 99/57 mmHg  Pulse 54  Temp(Src) 97.6 F (36.4 C) (Oral)  Ht 5\' 10"  (1.778 m)  Wt 179 lb 6.4 oz (81.375 kg)  BMI 25.74 kg/m2 General:   Alert and oriented. Pleasant and cooperative. Well-nourished and well-developed.  Head:  Normocephalic and atraumatic. Eyes:  Without icterus, sclera clear and conjunctiva pink.  Ears:  Normal auditory acuity. Cardiovascular:  S1, S2 present without murmurs appreciated. Normal pulses noted. Extremities without clubbing or edema. Respiratory:  Clear to auscultation bilaterally. No wheezes, rales, or rhonchi. No distress.  Gastrointestinal:  +BS, soft, non-tender and non-distended. No HSM noted. No guarding or rebound. No masses appreciated.  Rectal:  Deferred  Neurologic:  Alert and oriented x4;  grossly normal neurologically. Psych:  Alert and cooperative. Normal mood and affect.    08/26/2015 8:14 AM

## 2015-08-26 NOTE — Assessment & Plan Note (Signed)
Abnormal chest CT, was previously asked to provide CDs from prior study for further evaluation which she did not do. He is waiting for [facility] assistance paperwork to be followed and approved for him to follow-up for recommended PET scan. Refer him back to his PCP for further management

## 2015-08-26 NOTE — Assessment & Plan Note (Signed)
Patient with GERD which is typically well-controlled on Dexilant, poorly controlled another attempt PPIs. He ran out of samples and did not follow-up on his paperwork with pharmaceutical assistance. We do not have any samples in the office today. Prior authorization nurse is looking into his paperwork and notes that he has not been updated since 2014. She will provide him additional paperwork to re-file. We'll get additional samples or if paperwork is processed for pharmaceutical assistance we'll restart him on Dexilant.

## 2015-08-26 NOTE — Assessment & Plan Note (Signed)
Patient with continued loose stools although not diarrhea. Lateral stopping completed. He has cone assistance paperwork with him and we'll work on completing this. Advised him to take his cone paperwork before submitting it to the hospital and to take it to the lab as it will have the same information they required in order to set him up with assistance with blood work and other laboratory studies. Then submit the company pork to the hospital. This will hopefully get assistance rolling for [address] [place] complete his laboratory workup and evaluation. We will see him back in 3 months. Doubt colonic infection due to persistence of symptoms and not worsening systemically.

## 2015-08-27 NOTE — Progress Notes (Signed)
No pcp per patient 

## 2015-10-15 ENCOUNTER — Ambulatory Visit: Payer: Self-pay | Admitting: Physician Assistant

## 2015-10-15 ENCOUNTER — Encounter: Payer: Self-pay | Admitting: Physician Assistant

## 2015-10-15 VITALS — BP 126/90 | HR 82 | Temp 98.8°F | Ht 70.0 in | Wt 192.2 lb

## 2015-10-15 DIAGNOSIS — J449 Chronic obstructive pulmonary disease, unspecified: Secondary | ICD-10-CM

## 2015-10-15 DIAGNOSIS — R918 Other nonspecific abnormal finding of lung field: Secondary | ICD-10-CM

## 2015-10-15 DIAGNOSIS — E785 Hyperlipidemia, unspecified: Secondary | ICD-10-CM

## 2015-10-15 DIAGNOSIS — R112 Nausea with vomiting, unspecified: Secondary | ICD-10-CM

## 2015-10-15 DIAGNOSIS — Z131 Encounter for screening for diabetes mellitus: Secondary | ICD-10-CM

## 2015-10-15 LAB — GLUCOSE, POCT (MANUAL RESULT ENTRY): POC Glucose: 113 mg/dl — AB (ref 70–99)

## 2015-10-15 NOTE — Progress Notes (Signed)
BP 126/90 mmHg  Pulse 82  Temp(Src) 98.8 F (37.1 C)  Ht 5\' 10"  (1.778 m)  Wt 192 lb 3.2 oz (87.181 kg)  BMI 27.58 kg/m2  SpO2 96%   Subjective:    Patient ID: Shawn [name], male    DOB: [birth year], 44 y.o.   MRN: [id number]  HPI: Shawn [name] is a 44 y.o. male presenting on 10/15/2015 for New Patient (Initial Visit)   HPI  Chief Complaint  Patient presents with  . New Patient (Initial Visit)    pt needs a provider    -Pt was previously pt of [facility].   He was only pt here previously for 3 months.  He was last seen 01/30/14.  He did not return after that b/c he moved to [place]. -at pt's last OV, we discussed that he needed referral to Pulmonologist for further evaluation after repeated abnormal Chest CT.  It was recommended to pt that he do this/get new medical provider after getting to [place] says he turned in cone discount application last week. -Pt currently seeing GI -Pt going to [place] had been going to [facility] after returning from [place]. He says he hasn't been there in about 3 months.  He is not returning there. -Pt c/o throwing up a lot.  Started about 2 wks ago.  Sometimes 2-3 episodes daily.  Sometimes once/ day.  Sometimes it is before he eats and so it's just yellow.   -Pt denies dysphagia.  He says he feels nausea before he vomits. States his diarrhea is improved.   He occassionly has some low abd pain when he needs to have a BM but says otherwise no abd pain.  Relevant past medical, surgical, family and social history reviewed and updated as indicated. Interim medical history since our last visit reviewed. Allergies and medications reviewed and updated.  Current outpatient prescriptions:  .  albuterol (PROVENTIL HFA;VENTOLIN HFA) 108 (90 BASE) MCG/ACT inhaler, Inhale 1-2 puffs into the lungs every 6 (six) hours as needed for wheezing or shortness of breath., Disp: , Rfl:  .  budesonide-formoterol (SYMBICORT) 160-4.5 MCG/ACT inhaler, Inhale 2  puffs into the lungs 2 (two) times daily., Disp: , Rfl:  .  cetirizine (ZYRTEC) 10 MG tablet, Take 10 mg by mouth daily., Disp: , Rfl:  .  dexlansoprazole (DEXILANT) 60 MG capsule, Take 1 capsule (60 mg total) by mouth daily., Disp: 20 capsule, Rfl: 0 .  DULoxetine (CYMBALTA) 30 MG capsule, Take 30 mg by mouth daily., Disp: , Rfl:  .  FLUoxetine (PROZAC) 20 MG capsule, Take 40 mg by mouth daily. , Disp: , Rfl:  .  GABAPENTIN PO, Take by mouth., Disp: , Rfl:  .  hydrALAZINE (APRESOLINE) 50 MG tablet, Take 50 mg by mouth 2 (two) times daily. , Disp: , Rfl:  .  IRON PO, Take by mouth daily., Disp: , Rfl:  .  lamoTRIgine (LAMICTAL) 25 MG tablet, Take 25 mg by mouth daily., Disp: , Rfl:  .  omeprazole (PRILOSEC) 40 MG capsule, Take 40 mg by mouth daily., Disp: , Rfl:  .  traMADol (ULTRAM) 50 MG tablet, Take 50 mg by mouth every 8 (eight) hours as needed for moderate pain or severe pain. , Disp: , Rfl:  .  traZODone (DESYREL) 150 MG tablet, Take 300 mg by mouth at bedtime., Disp: , Rfl:    Review of Systems  Constitutional: Positive for diaphoresis and fatigue. Negative for fever, chills, appetite change and unexpected weight change.  HENT:  Positive for hearing loss. Negative for congestion, dental problem, drooling, ear pain, facial swelling, mouth sores, sneezing, sore throat, trouble swallowing and voice change.   Eyes: Negative for pain, discharge, redness, itching and visual disturbance.  Respiratory: Positive for shortness of breath. Negative for cough, choking and wheezing.   Cardiovascular: Negative for chest pain, palpitations and leg swelling.  Gastrointestinal: Positive for vomiting. Negative for abdominal pain, diarrhea, constipation and blood in stool.  Endocrine: Negative for cold intolerance, heat intolerance and polydipsia.  Genitourinary: Negative for dysuria, hematuria and decreased urine volume.  Musculoskeletal: Positive for back pain and arthralgias. Negative for gait problem.   Skin: Negative for rash.  Allergic/Immunologic: Negative for environmental allergies.  Neurological: Positive for light-headedness and headaches. Negative for seizures and syncope.  Hematological: Negative for adenopathy.  Psychiatric/Behavioral: Positive for dysphoric mood and agitation. Negative for suicidal ideas. The patient is nervous/anxious.     Per HPI unless specifically indicated above     Objective:    BP 126/90 mmHg  Pulse 82  Temp(Src) 98.8 F (37.1 C)  Ht 5\' 10"  (1.778 m)  Wt 192 lb 3.2 oz (87.181 kg)  BMI 27.58 kg/m2  SpO2 96%  Wt Readings from Last 3 Encounters:  10/15/15 192 lb 3.2 oz (87.181 kg)  08/26/15 179 lb 6.4 oz (81.375 kg)  08/17/15 175 lb (79.379 kg)    Physical Exam  Constitutional: He is oriented to person, place, and time. He appears well-developed and well-nourished.  HENT:  Head: Normocephalic and atraumatic.  Neck: Neck supple.  Cardiovascular: Normal rate and regular rhythm.   Pulmonary/Chest: Effort normal and breath sounds normal. He has no wheezes.  Abdominal: Soft. Bowel sounds are normal. He exhibits no distension and no mass. There is no hepatosplenomegaly. There is no tenderness. There is no rebound and no guarding.  Musculoskeletal: He exhibits no edema.  Lymphadenopathy:    He has no cervical adenopathy.  Neurological: He is alert and oriented to person, place, and time.  Skin: Skin is warm and dry. No rash noted.  Psychiatric: He has a normal mood and affect. His behavior is normal.  Vitals reviewed.   Results for orders placed or performed in visit on 10/15/15  POCT Glucose (CBG)  Result Value Ref Range   POC Glucose 113 (A) 70 - 99 mg/dl      Assessment & Plan:   Encounter Diagnoses  Name Primary?  . Abnormal CT scan of lung Yes  . Hyperlipemia   . Non-intractable vomiting with nausea, vomiting of unspecified type   . Chronic obstructive pulmonary disease, unspecified COPD type ([place])   . Screening for diabetes  mellitus     -GERD- being treated by GI -Diarrhea- being evauated by GI -Bipolar d/o- being treated by [facility] -Abnormal CT chest- repeat test ordered.  Will start on referral to pulmonologist -COPD- cont current inhalers -Hx hyperlipidemia- check fasting labs -F/u OV 1 mo

## 2015-10-16 LAB — COMPLETE METABOLIC PANEL WITH GFR
ALT: 19 U/L (ref 9–46)
AST: 18 U/L (ref 10–40)
Albumin: 4.1 g/dL (ref 3.6–5.1)
Alkaline Phosphatase: 49 U/L (ref 40–115)
BUN: 11 mg/dL (ref 7–25)
CHLORIDE: 103 mmol/L (ref 98–110)
CO2: 27 mmol/L (ref 20–31)
CREATININE: 0.93 mg/dL (ref 0.60–1.35)
Calcium: 9.9 mg/dL (ref 8.6–10.3)
GFR, Est African American: >89 mL/min (ref >=60)
GFR, Est Non African American: >89 mL/min (ref >=60)
GLUCOSE: 96 mg/dL (ref 65–99)
Potassium: 4.5 mmol/L (ref 3.5–5.3)
Sodium: 141 mmol/L (ref 135–146)
Total Bilirubin: 0.8 mg/dL (ref 0.2–1.2)
Total Protein: 7.7 g/dL (ref 6.1–8.1)

## 2015-10-16 LAB — LIPID PANEL
CHOLESTEROL: 197 mg/dL (ref 125–200)
HDL: 62 mg/dL (ref >=40)
LDL CALC: 113 mg/dL (ref <130)
Total CHOL/HDL Ratio: 3.2 Ratio (ref <=5.0)
Triglycerides: 109 mg/dL (ref <150)
VLDL: 22 mg/dL (ref <30)

## 2015-10-16 LAB — AMYLASE: Amylase: 46 U/L (ref 0–105)

## 2015-10-16 LAB — LIPASE: Lipase: 20 U/L (ref 7–60)

## 2015-10-20 ENCOUNTER — Ambulatory Visit (HOSPITAL_COMMUNITY)
Admission: RE | Admit: 2015-10-20 | Discharge: 2015-10-20 | Disposition: A | Discharge status: Home / Self Care | Payer: Self-pay | Source: Ambulatory Visit | Attending: Physician Assistant | Admitting: Physician Assistant

## 2015-10-20 DIAGNOSIS — K449 Diaphragmatic hernia without obstruction or gangrene: Secondary | ICD-10-CM | POA: Insufficient documentation

## 2015-10-20 DIAGNOSIS — R918 Other nonspecific abnormal finding of lung field: Secondary | ICD-10-CM | POA: Insufficient documentation

## 2015-11-05 ENCOUNTER — Encounter (HOSPITAL_COMMUNITY): Payer: Self-pay | Admitting: Emergency Medicine

## 2015-11-05 ENCOUNTER — Emergency Department (HOSPITAL_COMMUNITY)
Admission: EM | Admit: 2015-11-05 | Discharge: 2015-11-05 | Disposition: A | Discharge status: Home / Self Care | Payer: Self-pay | Attending: Emergency Medicine | Admitting: Emergency Medicine

## 2015-11-05 DIAGNOSIS — Z7951 Long term (current) use of inhaled steroids: Secondary | ICD-10-CM | POA: Insufficient documentation

## 2015-11-05 DIAGNOSIS — D649 Anemia, unspecified: Secondary | ICD-10-CM | POA: Insufficient documentation

## 2015-11-05 DIAGNOSIS — F250 Schizoaffective disorder, bipolar type: Secondary | ICD-10-CM | POA: Insufficient documentation

## 2015-11-05 DIAGNOSIS — M1990 Unspecified osteoarthritis, unspecified site: Secondary | ICD-10-CM | POA: Insufficient documentation

## 2015-11-05 DIAGNOSIS — K219 Gastro-esophageal reflux disease without esophagitis: Secondary | ICD-10-CM | POA: Insufficient documentation

## 2015-11-05 DIAGNOSIS — Z87891 Personal history of nicotine dependence: Secondary | ICD-10-CM | POA: Insufficient documentation

## 2015-11-05 DIAGNOSIS — Z791 Long term (current) use of non-steroidal anti-inflammatories (NSAID): Secondary | ICD-10-CM | POA: Insufficient documentation

## 2015-11-05 DIAGNOSIS — R140 Abdominal distension (gaseous): Secondary | ICD-10-CM | POA: Insufficient documentation

## 2015-11-05 DIAGNOSIS — J449 Chronic obstructive pulmonary disease, unspecified: Secondary | ICD-10-CM | POA: Insufficient documentation

## 2015-11-05 DIAGNOSIS — R635 Abnormal weight gain: Secondary | ICD-10-CM | POA: Insufficient documentation

## 2015-11-05 DIAGNOSIS — Z79899 Other long term (current) drug therapy: Secondary | ICD-10-CM | POA: Insufficient documentation

## 2015-11-05 DIAGNOSIS — I519 Heart disease, unspecified: Secondary | ICD-10-CM | POA: Insufficient documentation

## 2015-11-05 DIAGNOSIS — R112 Nausea with vomiting, unspecified: Secondary | ICD-10-CM

## 2015-11-05 DIAGNOSIS — I669 Occlusion and stenosis of unspecified cerebral artery: Secondary | ICD-10-CM | POA: Insufficient documentation

## 2015-11-05 DIAGNOSIS — F319 Bipolar disorder, unspecified: Secondary | ICD-10-CM | POA: Insufficient documentation

## 2015-11-05 LAB — CBC WITH DIFFERENTIAL/PLATELET
BASOS PCT: 1 %
Basophils Absolute: 0.1 10*3/uL (ref 0.0–0.1)
Eosinophils Absolute: 0.3 10*3/uL (ref 0.0–0.7)
Eosinophils Relative: 3 %
HEMATOCRIT: 44.1 % (ref 39.0–52.0)
Hemoglobin: 15.3 g/dL (ref 13.0–17.0)
Lymphocytes Relative: 27 %
Lymphs Abs: 2.8 10*3/uL (ref 0.7–4.0)
MCH: 32.9 pg (ref 26.0–34.0)
MCHC: 34.7 g/dL (ref 30.0–36.0)
MCV: 94.8 fL (ref 78.0–100.0)
MONO ABS: 0.7 10*3/uL (ref 0.1–1.0)
MONOS PCT: 7 %
NEUTROS ABS: 6.5 10*3/uL (ref 1.7–7.7)
Neutrophils Relative %: 62 %
Platelets: 250 10*3/uL (ref 150–400)
RBC: 4.65 MIL/uL (ref 4.22–5.81)
RDW: 12.7 % (ref 11.5–15.5)
WBC: 10.4 10*3/uL (ref 4.0–10.5)

## 2015-11-05 LAB — COMPREHENSIVE METABOLIC PANEL
ALBUMIN: 4.2 g/dL (ref 3.5–5.0)
ALT: 44 U/L (ref 17–63)
ANION GAP: 10 (ref 5–15)
AST: 28 U/L (ref 15–41)
Alkaline Phosphatase: 45 U/L (ref 38–126)
BILIRUBIN TOTAL: 0.5 mg/dL (ref 0.3–1.2)
BUN: 21 mg/dL — ABNORMAL HIGH (ref 6–20)
CO2: 28 mmol/L (ref 22–32)
Calcium: 9.7 mg/dL (ref 8.9–10.3)
Chloride: 103 mmol/L (ref 101–111)
Creatinine, Ser: 1.46 mg/dL — ABNORMAL HIGH (ref 0.61–1.24)
GFR calc Af Amer: >60 mL/min (ref >60)
GFR, EST NON AFRICAN AMERICAN: 56 mL/min — AB (ref >60)
Glucose, Bld: 108 mg/dL — ABNORMAL HIGH (ref 65–99)
POTASSIUM: 4.7 mmol/L (ref 3.5–5.1)
Sodium: 141 mmol/L (ref 135–145)
TOTAL PROTEIN: 7.6 g/dL (ref 6.5–8.1)

## 2015-11-05 LAB — PROTIME-INR
INR: 1.07 (ref 0.00–1.49)
PROTHROMBIN TIME: 14.1 s (ref 11.6–15.2)

## 2015-11-05 LAB — POC OCCULT BLOOD, ED: FECAL OCCULT BLD: NEGATIVE

## 2015-11-05 MED ORDER — PROMETHAZINE HCL 25 MG PO TABS: 25.0000 mg | ORAL_TABLET | Freq: Four times a day (QID) | ORAL | Status: DC | PRN

## 2015-11-05 MED ORDER — FAMOTIDINE 20 MG PO TABS: 20.0000 mg | ORAL_TABLET | Freq: Two times a day (BID) | ORAL | Status: DC

## 2015-11-05 NOTE — ED Notes (Signed)
Patient complaining of bloating and black tarry stools "for at least a year." States last black and tarry stool was "a couple of days ago." Denies pain at this time.

## 2015-11-05 NOTE — Discharge Instructions (Signed)

## 2015-11-05 NOTE — ED Provider Notes (Signed)
CSN: [id number]     Arrival date & time 11/05/15  1825 History   First MD Initiated Contact with Patient 11/05/15 1858     Chief Complaint  Patient presents with  . Bloated  . Melena    HPI Pt has been vomiting off and on for a couple of years.  A couple of years ago when he first had this issue he was diagnosed with diverticulitis.  That resolved but he continued to have trouble with intermittent vomiting. He vomited once this am.  He tends to vomit at least once per day. He has not noted any blood in the vomit. He feels like he is bloated and has been gaining weight.  Whenever he eats he feels bloated.  [facility] and He has an appointment with a GI doctor schedule but it is not until next month.  He has been noticed that his stools look dark.  His symptoms are persisting so he came to the ED for evaluation. Past Medical History  Diagnosis Date  . Arthritis   . Back pain   . Bipolar 1 disorder ([place])   . GERD (gastroesophageal reflux disease)   . Diverticulitis   . Blood clots in brain   . Vomiting   . COPD (chronic obstructive pulmonary disease) ([place])   . Lung nodules   . Bipolar disorder ([place])   . Schizoaffective disorder, bipolar type ([place])   . Heart disease     "irregular heart beat"  . Anemia    Past Surgical History  Procedure Laterality Date  . Brain surgery      after head injury, patient states had 2 large blood clots evacuated  . Colonoscopy with esophagogastroduodenoscopy (egd) N/A 12/05/2013    [id number] erosive relfux/HH/melanosis coli/colonic diverticulosis. tubulovillous adenoma removed. next tcs 11/2016   Family History  Problem Relation Age of Onset  . Adopted: Yes  . Colon cancer Neg Hx     adopted at 73 months old, unsure about GI history of parents    Social History  Substance Use Topics  . Smoking status: Former Smoker -- 0.75 packs/day for 25 years    Types: Cigars, Cigarettes    Quit date: 07/09/2014  . Smokeless  tobacco: Current User    Types: Snuff    Last Attempt to Quit: 07/09/2014     Comment: pt uses dip tobacco  . Alcohol Use: 0.0 oz/week    0 Standard drinks or equivalent per week     Comment: "once in awhile" average 1-2 times a week with 1-2 drinks per sitting    Review of Systems  Constitutional: Positive for unexpected weight change (weight gain, not loss). Negative for fever, appetite change and fatigue.  Respiratory: Negative for cough.   Cardiovascular: Negative for chest pain.  Gastrointestinal: Positive for nausea. Negative for vomiting, abdominal pain, diarrhea, constipation, blood in stool and anal bleeding.  Endocrine: Negative for polydipsia.  Genitourinary: Negative for dysuria.  Skin: Negative for rash.  All other systems reviewed and are negative.     Allergies  Review of patient's allergies indicates no known allergies.  Home Medications   Prior to Admission medications   Medication Sig Start Date End Date Taking? Authorizing Provider  budesonide-formoterol (SYMBICORT) 160-4.5 MCG/ACT inhaler Inhale 2 puffs into the lungs 2 (two) times daily.   Yes Historical Provider, MD  cetirizine (ZYRTEC) 10 MG tablet Take 10 mg by mouth daily.   Yes Historical Provider, MD  dexlansoprazole ([place]) 60  MG capsule Take 1 capsule (60 mg total) by mouth daily. 07/03/15  Yes Mahala Menghini, PA-C  diclofenac (VOLTAREN) 75 MG EC tablet Take 75 mg by mouth 2 (two) times daily.   Yes Historical Provider, MD  FLUoxetine (PROZAC) 40 MG capsule Take 40 mg by mouth daily.   Yes Historical Provider, MD  gabapentin (NEURONTIN) 300 MG capsule Take 300 mg by mouth 3 (three) times daily.   Yes Historical Provider, MD  IRON PO Take by mouth daily.   Yes Historical Provider, MD  lamoTRIgine (LAMICTAL) 25 MG tablet Take 25 mg by mouth 2 (two) times daily.    Yes Historical Provider, MD  metoprolol tartrate (LOPRESSOR) 25 MG tablet Take 25 mg by mouth 2 (two) times daily.   Yes Historical  Provider, MD  omeprazole (PRILOSEC) 40 MG capsule Take 40 mg by mouth daily.   Yes Historical Provider, MD  traZODone (DESYREL) 150 MG tablet Take 300 mg by mouth at bedtime.   Yes Historical Provider, MD  albuterol (PROVENTIL HFA;VENTOLIN HFA) 108 (90 BASE) MCG/ACT inhaler Inhale 1-2 puffs into the lungs every 6 (six) hours as needed for wheezing or shortness of breath.    Historical Provider, MD  famotidine (PEPCID) 20 MG tablet Take 1 tablet (20 mg total) by mouth 2 (two) times daily. 11/05/15   Dorie Rank, MD  promethazine (PHENERGAN) 25 MG tablet Take 1 tablet (25 mg total) by mouth every 6 (six) hours as needed for nausea or vomiting. 11/05/15   Dorie Rank, MD  traMADol (ULTRAM) 50 MG tablet Take 50 mg by mouth every 8 (eight) hours as needed for moderate pain or severe pain.     Historical Provider, MD   BP 96/75 mmHg  Pulse 70  Temp(Src) 98.4 F (36.9 C) (Oral)  Resp 20  Ht 5\' 11"  (1.803 m)  Wt 88.451 kg  BMI 27.21 kg/m2  SpO2 94% Physical Exam  Constitutional: He appears well-developed and well-nourished. No distress.  HENT:  Head: Normocephalic and atraumatic.  Right Ear: External ear normal.  Left Ear: External ear normal.  Eyes: Conjunctivae are normal. Right eye exhibits no discharge. Left eye exhibits no discharge. No scleral icterus.  Neck: Neck supple. No tracheal deviation present.  Cardiovascular: Normal rate, regular rhythm and intact distal pulses.   Pulmonary/Chest: Effort normal and breath sounds normal. No stridor. No respiratory distress. He has no wheezes. He has no rales.  Abdominal: Soft. Bowel sounds are normal. He exhibits no distension. There is no tenderness. There is no rebound and no guarding.  Genitourinary: Rectal exam shows no mass. Guaiac negative stool.  Brown stool   Musculoskeletal: He exhibits no edema or tenderness.  Neurological: He is alert. He has normal strength. No cranial nerve deficit (no facial droop, extraocular movements intact, no  slurred speech) or sensory deficit. He exhibits normal muscle tone. He displays no seizure activity. Coordination normal.  Skin: Skin is warm and dry. No rash noted.  Psychiatric: He has a normal mood and affect.  Nursing note and vitals reviewed.   ED Course  Procedures (including critical care time) Labs Review Labs Reviewed  COMPREHENSIVE METABOLIC PANEL - Abnormal; Notable for the following:    Glucose, Bld 108 (*)    BUN 21 (*)    Creatinine, Ser 1.46 (*)    GFR calc non Af Amer 56 (*)    All other components within normal limits  CBC WITH DIFFERENTIAL/PLATELET  PROTIME-INR  POC OCCULT BLOOD, ED  MDM   Final diagnoses:  Non-intractable vomiting with nausea, unspecified vomiting type    Intestine having trouble with nausea and vomiting intermittently for the past year.  This creatinine is increased but I doubt that he has significant dehydration. Patient's CBC is normal and the Hemoccult is negative. There is no evidence of anemia or GI bleed.    I'll try the patient on a course of antacids. He has scheduled outpatient follow-up with a gastroenterologist.  At this time there does not appear to be any evidence of an acute emergency medical condition and the patient appears stable for discharge with appropriate outpatient follow up.     Dorie Rank, MD 11/08/15 [phone]

## 2015-11-13 ENCOUNTER — Ambulatory Visit: Payer: Self-pay | Admitting: Physician Assistant

## 2015-11-13 ENCOUNTER — Encounter: Payer: Self-pay | Admitting: Physician Assistant

## 2015-11-13 VITALS — BP 96/64 | HR 62 | Temp 97.5°F | Ht 70.0 in | Wt 193.3 lb

## 2015-11-13 DIAGNOSIS — J449 Chronic obstructive pulmonary disease, unspecified: Secondary | ICD-10-CM

## 2015-11-13 DIAGNOSIS — R9389 Abnormal findings on diagnostic imaging of other specified body structures: Secondary | ICD-10-CM

## 2015-11-13 DIAGNOSIS — R7989 Other specified abnormal findings of blood chemistry: Secondary | ICD-10-CM

## 2015-11-13 NOTE — Patient Instructions (Addendum)
STOP METOPROLOL (blood pressure medicine)  Fat and Cholesterol Restricted Diet High levels of fat and cholesterol in your blood may lead to various health problems, such as diseases of the heart, blood vessels, gallbladder, liver, and pancreas. Fats are concentrated sources of energy that come in various forms. Certain types of fat, including saturated fat, may be harmful in excess. Cholesterol is a substance needed by your body in small amounts. Your body makes all the cholesterol it needs. Excess cholesterol comes from the food you eat. When you have high levels of cholesterol and saturated fat in your blood, health problems can develop because the excess fat and cholesterol will gather along the walls of your blood vessels, causing them to narrow. Choosing the right foods will help you control your intake of fat and cholesterol. This will help keep the levels of these substances in your blood within normal limits and reduce your risk of disease. WHAT IS MY PLAN? Your health care provider recommends that you:  Get no more than __________ % of the total calories in your daily diet from fat.  Limit your intake of saturated fat to less than ______% of your total calories each day.  Limit the amount of cholesterol in your diet to less than _________mg per day. WHAT TYPES OF FAT SHOULD I CHOOSE?  Choose healthy fats more often. Choose monounsaturated and polyunsaturated fats, such as olive and canola oil, flaxseeds, walnuts, almonds, and seeds.  Eat more omega-3 fats. Good choices include salmon, mackerel, sardines, tuna, flaxseed oil, and ground flaxseeds. Aim to eat fish at least two times a week.  Limit saturated fats. Saturated fats are primarily found in animal products, such as meats, butter, and cream. Plant sources of saturated fats include palm oil, palm kernel oil, and coconut oil.  Avoid foods with partially hydrogenated oils in them. These contain trans fats. Examples of foods that  contain trans fats are stick margarine, some tub margarines, cookies, crackers, and other baked goods. WHAT GENERAL GUIDELINES DO I NEED TO FOLLOW? These guidelines for healthy eating will help you control your intake of fat and cholesterol:  Check food labels carefully to identify foods with trans fats or high amounts of saturated fat.  Fill one half of your plate with vegetables and green salads.  Fill one fourth of your plate with whole grains. Look for the word "whole" as the first word in the ingredient list.  Fill one fourth of your plate with lean protein foods.  Limit fruit to two servings a day. Choose fruit instead of juice.  Eat more foods that contain soluble fiber. Examples of foods that contain this type of fiber are apples, broccoli, carrots, beans, peas, and barley. Aim to get 20-30 g of fiber per day.  Eat more home-cooked food and less restaurant, buffet, and fast food.  Limit or avoid alcohol.  Limit foods high in starch and sugar.  Limit fried foods.  Cook foods using methods other than frying. Baking, boiling, grilling, and broiling are all great options.  Lose weight if you are overweight. Losing just 5-10% of your initial body weight can help your overall health and prevent diseases such as diabetes and heart disease. WHAT FOODS CAN I EAT? Grains Whole grains, such as whole wheat or whole grain breads, crackers, cereals, and pasta. Unsweetened oatmeal, bulgur, barley, quinoa, or brown rice. Corn or whole wheat flour tortillas. Vegetables Fresh or frozen vegetables (raw, steamed, roasted, or grilled). Green salads. Fruits All fresh, canned (in natural  juice), or frozen fruits. Meat and Other Protein Products Ground beef (85% or leaner), grass-fed beef, or beef trimmed of fat. Skinless chicken or [place]. Ground chicken or [place]. Pork trimmed of fat. All fish and seafood. Eggs. Dried beans, peas, or lentils. Unsalted nuts or seeds. Unsalted canned or dry  beans. Dairy Low-fat dairy products, such as skim or 1% milk, 2% or reduced-fat cheeses, low-fat ricotta or cottage cheese, or plain low-fat yogurt. Fats and Oils Tub margarines without trans fats. Light or reduced-fat mayonnaise and salad dressings. Avocado. Olive, canola, sesame, or safflower oils. Natural peanut or almond butter (choose ones without added sugar and oil). The items listed above may not be a complete list of recommended foods or beverages. Contact your dietitian for more options. WHAT FOODS ARE NOT RECOMMENDED? Grains White bread. White pasta. White rice. Cornbread. Bagels, pastries, and croissants. Crackers that contain trans fat. Vegetables White potatoes. Corn. Creamed or fried vegetables. Vegetables in a cheese sauce. Fruits Dried fruits. Canned fruit in light or heavy syrup. Fruit juice. Meat and Other Protein Products Fatty cuts of meat. Ribs, chicken wings, bacon, sausage, bologna, salami, chitterlings, fatback, hot dogs, bratwurst, and packaged luncheon meats. Liver and organ meats. Dairy Whole or 2% milk, cream, half-and-half, and cream cheese. Whole milk cheeses. Whole-fat or sweetened yogurt. Full-fat cheeses. Nondairy creamers and whipped toppings. Processed cheese, cheese spreads, or cheese curds. Sweets and Desserts Corn syrup, sugars, honey, and molasses. Candy. Jam and jelly. Syrup. Sweetened cereals. Cookies, pies, cakes, donuts, muffins, and ice cream. Fats and Oils Butter, stick margarine, lard, shortening, ghee, or bacon fat. Coconut, palm kernel, or palm oils. Beverages Alcohol. Sweetened drinks (such as sodas, lemonade, and fruit drinks or punches). The items listed above may not be a complete list of foods and beverages to avoid. Contact your dietitian for more information.   This information is not intended to replace advice given to you by your health care provider. Make sure you discuss any questions you have with your health care provider.    Document Released: 10/25/2005 Document Revised: 11/15/2014 Document Reviewed: 01/23/2014 Elsevier Interactive Patient Education [employer].

## 2015-11-13 NOTE — Progress Notes (Signed)
BP 96/64 mmHg  Pulse 62  Temp(Src) 97.5 F (36.4 C)  Ht 5\' 10"  (1.778 m)  Wt 193 lb 4.8 oz (87.68 kg)  BMI 27.74 kg/m2  SpO2 97%   Subjective:    Patient ID: Shawn [name], male    DOB: 1971/02/18, 45 y.o.   MRN: [id number]  HPI: Shawn [name] is a 45 y.o. male presenting on 11/13/2015 for Hyperlipidemia; Abdominal Pain; Results; Nausea; and Diarrhea   HPI     -GERD- being treated by GI -Diarrhea- being evauated by GI -Bipolar d/o- being treated by [facility] -Abnormal CT chest- repeat test ordered. Will start on referral to pulmonologist -COPD- pt on inhalers -Hx hyperlipidemia-  fasting labs drawn   Relevant past medical, surgical, family and social history reviewed and updated as indicated. Interim medical history since our last visit reviewed. Allergies and medications reviewed and updated.  Current outpatient prescriptions:  .  budesonide-formoterol (SYMBICORT) 160-4.5 MCG/ACT inhaler, Inhale 2 puffs into the lungs 2 (two) times daily., Disp: , Rfl:  .  cetirizine (ZYRTEC) 10 MG tablet, Take 10 mg by mouth daily as needed. , Disp: , Rfl:  .  cyclobenzaprine (FLEXERIL) 10 MG tablet, Take 10 mg by mouth 3 (three) times daily as needed for muscle spasms., Disp: , Rfl:  .  dexlansoprazole (DEXILANT) 60 MG capsule, Take 1 capsule (60 mg total) by mouth daily., Disp: 20 capsule, Rfl: 0 .  diclofenac (VOLTAREN) 75 MG EC tablet, Take 75 mg by mouth 2 (two) times daily as needed. , Disp: , Rfl:  .  famotidine (PEPCID) 20 MG tablet, Take 1 tablet (20 mg total) by mouth 2 (two) times daily., Disp: 14 tablet, Rfl: 0 .  FLUoxetine (PROZAC) 40 MG capsule, Take 40 mg by mouth daily., Disp: , Rfl:  .  gabapentin (NEURONTIN) 300 MG capsule, Take 300 mg by mouth 3 (three) times daily as needed. , Disp: , Rfl:  .  ibuprofen (ADVIL,MOTRIN) 600 MG tablet, Take 600 mg by mouth every 6 (six) hours as needed., Disp: , Rfl:  .  IRON PO, Take by mouth daily., Disp: , Rfl:  .  lamoTRIgine  (LAMICTAL) 25 MG tablet, Take 25 mg by mouth 2 (two) times daily. , Disp: , Rfl:  .  metoprolol tartrate (LOPRESSOR) 25 MG tablet, Take 25 mg by mouth 2 (two) times daily., Disp: , Rfl:  .  omeprazole (PRILOSEC) 40 MG capsule, Take 40 mg by mouth daily., Disp: , Rfl:  .  promethazine (PHENERGAN) 25 MG tablet, Take 1 tablet (25 mg total) by mouth every 6 (six) hours as needed for nausea or vomiting., Disp: 16 tablet, Rfl: 0 .  traMADol (ULTRAM) 50 MG tablet, Take 50 mg by mouth every 8 (eight) hours as needed for moderate pain or severe pain. , Disp: , Rfl:  .  traZODone (DESYREL) 150 MG tablet, Take 300 mg by mouth at bedtime as needed. , Disp: , Rfl:  .  albuterol (PROVENTIL HFA;VENTOLIN HFA) 108 (90 BASE) MCG/ACT inhaler, Inhale 1-2 puffs into the lungs every 6 (six) hours as needed for wheezing or shortness of breath., Disp: , Rfl:    Review of Systems  Constitutional: Positive for diaphoresis, fatigue and unexpected weight change. Negative for fever, chills and appetite change.  HENT: Positive for hearing loss. Negative for congestion, dental problem, drooling, ear pain, facial swelling, mouth sores, sneezing, sore throat, trouble swallowing and voice change.   Eyes: Positive for itching and visual disturbance. Negative for pain, discharge and  redness.  Respiratory: Positive for shortness of breath and wheezing. Negative for cough and choking.   Cardiovascular: Negative for chest pain, palpitations and leg swelling.  Gastrointestinal: Positive for vomiting and diarrhea. Negative for abdominal pain, constipation and blood in stool.  Endocrine: Positive for polydipsia. Negative for cold intolerance and heat intolerance.  Genitourinary: Negative for dysuria, hematuria and decreased urine volume.  Musculoskeletal: Positive for back pain and gait problem. Negative for arthralgias.  Skin: Positive for rash.  Allergic/Immunologic: Positive for environmental allergies.  Neurological: Negative for  seizures, syncope, light-headedness and headaches.  Hematological: Negative for adenopathy.  Psychiatric/Behavioral: Positive for dysphoric mood and agitation. Negative for suicidal ideas. The patient is nervous/anxious.     Per HPI unless specifically indicated above     Objective:    BP 96/64 mmHg  Pulse 62  Temp(Src) 97.5 F (36.4 C)  Ht 5\' 10"  (1.778 m)  Wt 193 lb 4.8 oz (87.68 kg)  BMI 27.74 kg/m2  SpO2 97%  Wt Readings from Last 3 Encounters:  11/13/15 193 lb 4.8 oz (87.68 kg)  11/05/15 195 lb (88.451 kg)  10/15/15 192 lb 3.2 oz (87.181 kg)    Physical Exam  Constitutional: He is oriented to person, place, and time. He appears well-developed and well-nourished.  HENT:  Head: Normocephalic and atraumatic.  Neck: Neck supple.  Cardiovascular: Normal rate and regular rhythm.   Pulmonary/Chest: Effort normal and breath sounds normal. He has no wheezes.  Abdominal: Soft. Bowel sounds are normal. There is no tenderness.  Musculoskeletal: He exhibits no edema.  Lymphadenopathy:    He has no cervical adenopathy.  Neurological: He is alert and oriented to person, place, and time.  Skin: Skin is warm and dry.  Psychiatric: He has a normal mood and affect. His behavior is normal.  Vitals reviewed.       Assessment & Plan:   Encounter Diagnoses  Name Primary?  . Abnormal chest CT Yes  . Elevated serum creatinine   . Chronic obstructive pulmonary disease, unspecified COPD type ([place])    -reviewed labs and CT with pt -Refer to pulmonologist -Continue with gastroenterology for GERD, diarrhea and abd pain -will discontinue  metoprolol -recommend pt Eat lowfat diet for lipids. No Rx at this time -Recheck Cr (was elevated in ER last week) -F/u 6 wk to recheck bp off the metoprolol

## 2015-11-14 LAB — CREATININE, SERUM: CREATININE: 1.01 mg/dL (ref 0.60–1.35)

## 2015-11-17 DIAGNOSIS — J449 Chronic obstructive pulmonary disease, unspecified: Secondary | ICD-10-CM | POA: Insufficient documentation

## 2015-11-26 ENCOUNTER — Encounter: Payer: Self-pay | Admitting: Nurse Practitioner

## 2015-11-26 ENCOUNTER — Ambulatory Visit (INDEPENDENT_AMBULATORY_CARE_PROVIDER_SITE_OTHER): Payer: Medicaid Other | Admitting: Nurse Practitioner

## 2015-11-26 VITALS — BP 121/78 | HR 78 | Temp 97.8°F | Ht 71.0 in | Wt 192.0 lb

## 2015-11-26 DIAGNOSIS — R195 Other fecal abnormalities: Secondary | ICD-10-CM | POA: Diagnosis not present

## 2015-11-26 DIAGNOSIS — K2100 Gastro-esophageal reflux disease with esophagitis, without bleeding: Secondary | ICD-10-CM

## 2015-11-26 NOTE — Progress Notes (Signed)
Referring Provider: No ref. provider found Primary Care Physician:  Soyla Dryer, PA-C Primary GI:  Dr. Gala Romney  Chief Complaint  Patient presents with  . Gastroesophageal Reflux  . Diarrhea    1 year  . Emesis    2 years     HPI:   45 year old male presents to follow-up on GERD, diarrhea. Last seen in our office 08/26/2015 which point he sedated GERD symptoms improve a Dexilant, currently out of samples and was trying to get patient assistance. At that time is having 2-4 bowel movements a day which were loose but not watery. Occasional nausea and vomiting has improved since starting Dexilant. At that point we recommend he keep proceed with the paperwork to get patient assistance for Dexilant. Recommended he follow through with recommended PET scan due to abnormal chest CT.  Today he states he is currently on Dexilant and Omeprazole. Dexilant works best and he is unsure why they didn't take him off the omeprazole. On Dexilant his GERD is well controlled. Will occasionally have N/V, as often as 3-4 times a week. Will often get really "nasaly" and be trying to clear his sinuses and "coughing up phlegm" before vomiting. No association with eating. States when it comes up it is clear yellow-looking stuff. Denies significant sinus drainage. Denies hematemesis. Has a bowel movement about 2-3 times a day, seems to be solidifying somewhat. Getting more solid, he points to [facility] 5 a lot, sometimes 4. Occasionally, before going to the bathroom has lower abdominal pain, which improves when he has a bowel movement; Not a consistent problem, only happens when he has to go but "holds it" for a prolonged time.. Denies hematochezia. Has black stools, was seen in the ER and was heme negative stools. Denies fever, chills, unintentional weight loss. Denies chest pain, dyspnea, dizziness, lightheadedness, syncope, near syncope. Denies any other upper or lower GI symptoms.  Past Medical History  Diagnosis  Date  . Arthritis   . Back pain   . Bipolar 1 disorder ([place])   . GERD (gastroesophageal reflux disease)   . Diverticulitis   . Blood clots in brain   . Vomiting   . COPD (chronic obstructive pulmonary disease) ([place])   . Lung nodules   . Bipolar disorder ([place])   . Schizoaffective disorder, bipolar type ([place])   . Heart disease     "irregular heart beat"  . Anemia     Past Surgical History  Procedure Laterality Date  . Brain surgery      after head injury, patient states had 2 large blood clots evacuated  . Colonoscopy with esophagogastroduodenoscopy (egd) N/A 12/05/2013    [id number] erosive relfux/HH/melanosis coli/colonic diverticulosis. tubulovillous adenoma removed. next tcs 11/2016    Current Outpatient Prescriptions  Medication Sig Dispense Refill  . albuterol (PROVENTIL HFA;VENTOLIN HFA) 108 (90 BASE) MCG/ACT inhaler Inhale 1-2 puffs into the lungs every 6 (six) hours as needed for wheezing or shortness of breath.    . budesonide-formoterol (SYMBICORT) 160-4.5 MCG/ACT inhaler Inhale 2 puffs into the lungs 2 (two) times daily.    . cetirizine (ZYRTEC) 10 MG tablet Take 10 mg by mouth daily as needed.     . cyclobenzaprine (FLEXERIL) 10 MG tablet Take 10 mg by mouth 3 (three) times daily as needed for muscle spasms.    Marland Kitchen dexlansoprazole (DEXILANT) 60 MG capsule Take 1 capsule (60 mg total) by mouth daily. 20 capsule 0  . diclofenac (VOLTAREN) 75 MG EC tablet Take 75 mg  by mouth 2 (two) times daily as needed.     Marland Kitchen FLUoxetine (PROZAC) 40 MG capsule Take 40 mg by mouth daily.    Marland Kitchen gabapentin (NEURONTIN) 300 MG capsule Take 300 mg by mouth 3 (three) times daily as needed.     . IRON PO Take by mouth daily.    Marland Kitchen lamoTRIgine (LAMICTAL) 25 MG tablet Take 25 mg by mouth 2 (two) times daily.     . metoprolol tartrate (LOPRESSOR) 25 MG tablet Take 25 mg by mouth 2 (two) times daily.    Marland Kitchen omeprazole (PRILOSEC) 40 MG capsule Take 40 mg by mouth daily.    . promethazine (PHENERGAN) 25 MG  tablet Take 1 tablet (25 mg total) by mouth every 6 (six) hours as needed for nausea or vomiting. 16 tablet 0  . traMADol (ULTRAM) 50 MG tablet Take 50 mg by mouth every 8 (eight) hours as needed for moderate pain or severe pain.     . traZODone (DESYREL) 150 MG tablet Take 300 mg by mouth at bedtime as needed.     . famotidine (PEPCID) 20 MG tablet Take 1 tablet (20 mg total) by mouth 2 (two) times daily. (Patient not taking: Reported on 11/26/2015) 14 tablet 0  . ibuprofen (ADVIL,MOTRIN) 600 MG tablet Take 600 mg by mouth every 6 (six) hours as needed. Reported on 11/26/2015     No current facility-administered medications for this visit.    Allergies as of 11/26/2015  . (No Known Allergies)    Family History  Problem Relation Age of Onset  . Adopted: Yes  . Colon cancer Neg Hx     adopted at 31 months old, unsure about GI history of parents     Social History   Social History  . Marital Status: Divorced    Spouse Name: N/A  . Number of Children: N/A  . Years of Education: N/A   Social History Main Topics  . Smoking status: Former Smoker -- 0.75 packs/day for 25 years    Types: Cigars, Cigarettes    Quit date: 07/09/2014  . Smokeless tobacco: Current User    Types: Snuff    Last Attempt to Quit: 07/09/2014     Comment: pt uses dip tobacco  . Alcohol Use: 0.0 oz/week    0 Standard drinks or equivalent per week     Comment: "once in awhile" average 1-2 times a week with 1-2 drinks per sitting  . Drug Use: No  . Sexual Activity: Not Asked   Other Topics Concern  . None   Social History Narrative    Review of Systems: General: Negative for anorexia, weight loss, fever, chills, fatigue, weakness. Eyes: Negative for vision changes.  ENT: Negative for hoarseness, difficulty swallowing. Admits sinus congestion and post-nasal drip. CV: Negative for chest pain, angina, palpitations, peripheral edema.  Respiratory: Negative for dyspnea at rest, cough, sputum, wheezing.  GI:  See history of present illness. Endo: Negative for unusual weight change.  Heme: Negative for bruising or bleeding.   Physical Exam: BP 121/78 mmHg  Pulse 78  Temp(Src) 97.8 F (36.6 C) (Oral)  Ht 5\' 11"  (1.803 m)  Wt 192 lb (87.091 kg)  BMI 26.79 kg/m2 General:   Alert and oriented. Pleasant and cooperative. Well-nourished and well-developed.  Head:  Normocephalic and atraumatic. Eyes:  Without icterus, sclera clear and conjunctiva pink.  Cardiovascular:  S1, S2 present without murmurs appreciated. Extremities without clubbing or edema. Respiratory:  Clear to auscultation bilaterally. No wheezes, rales, or  rhonchi. No distress.  Gastrointestinal:  +BS, soft, non-tender and non-distended. No HSM noted. No guarding or rebound. No masses appreciated.  Rectal:  Deferred  Musculoskalatal:  Symmetrical without gross deformities. Skin:  Intact without significant lesions or rashes. Neurologic:  Alert and oriented x4;  grossly normal neurologically. Psych:  Alert and cooperative. Normal mood and affect. Heme/Lymph/Immune: No excessive bruising noted.    11/26/2015 8:16 AM

## 2015-11-26 NOTE — Patient Instructions (Addendum)
1. Stop taking Omeprazole 2. Keep taking Dexilant once a day 3. Start taking Align probiotic. Take one capsule a day for 30 days to see if it helps further solidify your stools. We are giving you samples to last a month. 4. Return for follow-up in 3 months.   Probiotics WHAT ARE PROBIOTICS? Probiotics are the good bacteria and yeasts that live in your body and keep you and your digestive system healthy. Probiotics also help your body's defense (immune) system and protect your body against bad bacterial growth.  Certain foods contain probiotics, such as yogurt. Probiotics can also be purchased as a supplement. As with any supplement or drug, it is important to discuss its use with your health care provider.  WHAT AFFECTS THE BALANCE OF BACTERIA IN MY BODY? The balance of bacteria in your body can be affected by:   Antibiotic medicines. Antibiotics are sometimes necessary to treat infection. Unfortunately, they may kill good or friendly bacteria in your body as well as the bad bacteria. This may lead to stomach problems like diarrhea, gas, and cramping.  Disease. Some conditions are the result of an overgrowth of bad bacteria, yeasts, parasites, or fungi. These conditions include:   Infectious diarrhea.  Stomach and respiratory infections.  Skin infections.  Irritable bowel syndrome (IBS).  Inflammatory bowel diseases.  Ulcer due to Helicobacter pylori (H. pylori) infection.  Tooth decay and periodontal disease.  Vaginal infections. Stress and poor diet may also lower the good bacteria in your body.  WHAT TYPE OF PROBIOTIC IS RIGHT FOR ME? Probiotics are available over the counter at your local pharmacy, health food, or grocery store. They come in many different forms, combinations of strains, and dosing strengths. Some may need to be refrigerated. Always read the label for storage and usage instructions. Specific strains have been shown to be more effective for certain conditions.  Ask your health care provider what option is best for you.  WHY WOULD I NEED PROBIOTICS? There are many reasons your health care provider might recommend a probiotic supplement, including:   Diarrhea.  Constipation.  IBS.  Respiratory infections.  Yeast infections.  Acne, eczema, and other skin conditions.  Frequent urinary tract infections (UTIs). ARE THERE SIDE EFFECTS OF PROBIOTICS? Some people experience mild side effects when taking probiotics. Side effects are usually temporary and may include:   Gas.  Bloating.  Cramping. Rarely, serious side effects, such as infection or immune system changes, may occur. WHAT ELSE DO I NEED TO KNOW ABOUT PROBIOTICS?   There are many different strains of probiotics. Certain strains may be more effective depending on your condition. Probiotics are available in varying doses. Ask your health care provider which probiotic you should use and how often.   If you are taking probiotics along with antibiotics, it is generally recommended to wait at least 2 hours between taking the antibiotic and taking the probiotic.  FOR MORE INFORMATION:  [facility] for Complementary and Alternative Medicine [email or web address]   This information is not intended to replace advice given to you by your health care provider. Make sure you discuss any questions you have with your health care provider.   Document Released: 05/22/2014 Document Reviewed: 05/22/2014 Elsevier Interactive Patient Education 2016 [place].    Gastroesophageal Reflux Disease, Adult Normally, food travels down the esophagus and stays in the stomach to be digested. If a person has gastroesophageal reflux disease (GERD), food and stomach acid move back up into the esophagus. When this happens, the esophagus becomes  sore and swollen (inflamed). Over time, GERD can make small holes (ulcers) in the lining of the esophagus. HOME CARE Diet  Follow a diet as told by your  doctor. You may need to avoid foods and drinks such as:  Coffee and tea (with or without caffeine).  Drinks that contain alcohol.  Energy drinks and sports drinks.  Carbonated drinks or sodas.  Chocolate and cocoa.  Peppermint and mint flavorings.  Garlic and onions.  Horseradish.  Spicy and acidic foods, such as peppers, chili powder, curry powder, vinegar, hot sauces, and BBQ sauce.  Citrus fruit juices and citrus fruits, such as oranges, lemons, and limes.  Tomato-based foods, such as red sauce, chili, salsa, and pizza with red sauce.  Fried and fatty foods, such as donuts, french fries, potato chips, and high-fat dressings.  High-fat meats, such as hot dogs, rib eye steak, sausage, ham, and bacon.  High-fat dairy items, such as whole milk, butter, and cream cheese.  Eat small meals often. Avoid eating large meals.  Avoid drinking large amounts of liquid with your meals.  Avoid eating meals during the 2-3 hours before bedtime.  Avoid lying down right after you eat.  Do not exercise right after you eat. General Instructions  Pay attention to any changes in your symptoms.  Take over-the-counter and prescription medicines only as told by your doctor. Do not take aspirin, ibuprofen, or other NSAIDs unless your doctor says it is okay.  Do not use any tobacco products, including cigarettes, chewing tobacco, and e-cigarettes. If you need help quitting, ask your doctor.  Wear loose clothes. Do not wear anything tight around your waist.  Raise (elevate) the head of your bed about 6 inches (15 cm).  Try to lower your stress. If you need help doing this, ask your doctor.  If you are overweight, lose an amount of weight that is healthy for you. Ask your doctor about a safe weight loss goal.  Keep all follow-up visits as told by your doctor. This is important. GET HELP IF:  You have new symptoms.  You lose weight and you do not know why it is happening.  You have  trouble swallowing, or it hurts to swallow.  You have wheezing or a cough that keeps happening.  Your symptoms do not get better with treatment.  You have a hoarse voice. GET HELP RIGHT AWAY IF:  You have pain in your arms, neck, jaw, teeth, or back.  You feel sweaty, dizzy, or light-headed.  You have chest pain or shortness of breath.  You throw up (vomit) and your throw up looks like blood or coffee grounds.  You pass out (faint).  Your poop (stool) is bloody or black.  You cannot swallow, drink, or eat.   This information is not intended to replace advice given to you by your health care provider. Make sure you discuss any questions you have with your health care provider.   Document Released: 04/12/2008 Document Revised: 07/16/2015 Document Reviewed: 02/19/2015 Elsevier Interactive Patient Education 2016 [place] for Gastroesophageal Reflux Disease, Adult When you have gastroesophageal reflux disease (GERD), the foods you eat and your eating habits are very important. Choosing the right foods can help ease your discomfort.  WHAT GUIDELINES DO I NEED TO FOLLOW?   Choose fruits, vegetables, whole grains, and low-fat dairy products.   Choose low-fat meat, fish, and poultry.  Limit fats such as oils, salad dressings, butter, nuts, and avocado.  Keep a food diary. This helps you identify foods that cause symptoms.   Avoid foods that cause symptoms. These may be different for everyone.   Eat small meals often instead of 3 large meals a day.   Eat your meals slowly, in a place where you are relaxed.   Limit fried foods.   Cook foods using methods other than frying.   Avoid drinking alcohol.   Avoid drinking large amounts of liquids with your meals.   Avoid bending over or lying down until 2-3 hours after eating.  WHAT FOODS ARE NOT RECOMMENDED?  These are some foods and drinks that may make your symptoms  worse: Vegetables Tomatoes. Tomato juice. Tomato and spaghetti sauce. Chili peppers. Onion and garlic. Horseradish. Fruits Oranges, grapefruit, and lemon (fruit and juice). Meats High-fat meats, fish, and poultry. This includes hot dogs, ribs, ham, sausage, salami, and bacon. Dairy Whole milk and chocolate milk. Sour cream. Cream. Butter. Ice cream. Cream cheese.  Drinks Coffee and tea. Bubbly (carbonated) drinks or energy drinks. Condiments Hot sauce. Barbecue sauce.  Sweets/Desserts Chocolate and cocoa. Donuts. Peppermint and spearmint. Fats and Oils High-fat foods. This includes [place] fries and potato chips. Other Vinegar. Strong spices. This includes black pepper, white pepper, red pepper, cayenne, curry powder, cloves, ginger, and chili powder. The items listed above may not be a complete list of foods and drinks to avoid. Contact your dietitian for more information.   This information is not intended to replace advice given to you by your health care provider. Make sure you discuss any questions you have with your health care provider.   Document Released: 04/25/2012 Document Revised: 11/15/2014 Document Reviewed: 08/29/2013 Elsevier Interactive Patient Education [employer].

## 2015-12-03 NOTE — Assessment & Plan Note (Signed)
Loose stools doubt infection. Loose stools are improving. Recommend align probiotic one a day samples provided for 30 days. Return for follow-up in 3 months to evaluate symptom progression. Notify to call us if symptoms worsen.

## 2015-12-03 NOTE — Progress Notes (Signed)
cc'ed to pcp °

## 2015-12-03 NOTE — Assessment & Plan Note (Signed)
Patient with GERD symptoms on Dexilant but he continues to take omeprazole because he was not told specifically by his PCP to stop it. Recommend he stop taking omeprazole can continue Dexilant. Return for follow-up in 3 months.

## 2015-12-22 ENCOUNTER — Other Ambulatory Visit: Payer: Self-pay

## 2015-12-22 ENCOUNTER — Ambulatory Visit (INDEPENDENT_AMBULATORY_CARE_PROVIDER_SITE_OTHER): Payer: Medicaid Other | Admitting: Pulmonary Disease

## 2015-12-22 ENCOUNTER — Encounter: Payer: Self-pay | Admitting: Pulmonary Disease

## 2015-12-22 ENCOUNTER — Telehealth: Payer: Self-pay | Admitting: Pulmonary Disease

## 2015-12-22 ENCOUNTER — Telehealth: Payer: Self-pay | Admitting: Internal Medicine

## 2015-12-22 VITALS — BP 122/76 | HR 81 | Ht 71.0 in | Wt 194.4 lb

## 2015-12-22 DIAGNOSIS — E785 Hyperlipidemia, unspecified: Secondary | ICD-10-CM | POA: Insufficient documentation

## 2015-12-22 DIAGNOSIS — Q330 Congenital cystic lung: Secondary | ICD-10-CM

## 2015-12-22 DIAGNOSIS — F259 Schizoaffective disorder, unspecified: Secondary | ICD-10-CM | POA: Insufficient documentation

## 2015-12-22 DIAGNOSIS — R918 Other nonspecific abnormal finding of lung field: Secondary | ICD-10-CM

## 2015-12-22 DIAGNOSIS — Z91048 Other nonmedicinal substance allergy status: Secondary | ICD-10-CM

## 2015-12-22 DIAGNOSIS — Z9109 Other allergy status, other than to drugs and biological substances: Secondary | ICD-10-CM | POA: Insufficient documentation

## 2015-12-22 DIAGNOSIS — I499 Cardiac arrhythmia, unspecified: Secondary | ICD-10-CM | POA: Insufficient documentation

## 2015-12-22 DIAGNOSIS — J449 Chronic obstructive pulmonary disease, unspecified: Secondary | ICD-10-CM

## 2015-12-22 NOTE — Telephone Encounter (Signed)
6MWT & PFTs don't have to be the same day he sees me. Does that help? JN

## 2015-12-22 NOTE — Patient Instructions (Signed)
1. Continue taking your Symbicort as prescribed. 2. We will go over your lab work at her next appointment. 3. He will have a walking and breathing test at her next appointment. 4. Don't forget to come back in 48-72 hours to have your skin PPD read by a nurse. 5. I will see back in 2-4 weeks but please call if you have any other questions or concerns.  TESTS ORDERED: 1. Full pulmonary function testing at next appointment 2. 6 minute walk test on room air at next appointment 3. Labs today

## 2015-12-22 NOTE — Telephone Encounter (Signed)
noted 

## 2015-12-22 NOTE — Progress Notes (Signed)
Subjective:    Patient ID: Shawn [name], male    DOB: May 06, 1971, 45 y.o.   MRN: [id number]  HPI He reports he was found to have lung nodules around 2014 during a CT scan of his abdomen. He had a repeat CT scan in 2015 showing the nodules. He reports initially his nodule was "0.75" and it "doubled to 1.3" on the second CT scan. He reports he has had dyspnea on exertion for years. He reports walking on an incline causes dyspnea. He does have emesis sometimes if he is dyspneic enough. He reports intermittent, nonproductive cough. No hemoptysis that he can recall. He denies any prior PFTs. Previously diagnosed with COPD and has been on Symbicort for at least a year. He hasn't noticed a significant improvement in his breathing on it. He reports rare, intermittent wheezing. No worsening of dyspnea with exposure to perfumes or with seasonal changes. He does have chest tightness chronically. Denies any chest pain. No fever, chills, or sweats.  No adenopathy in his neck, groin, or axilla. No rashes or bruising. He reports he does have reflux if he misses his Dexilant. No dysphagia or odynophagia. He reports chronic pain in his neck and lower back, especially with walking. No joint stiffness or erythema. He does have some cramping in his toes and has occasional swelling in his hands. No dry eyes. Chronic dry mouth but no oral ulcers. Denies any Raynaud's phenomenon.  Review of Systems No dysuria or hematuria. He reports feeling bloated in his abdomen. Denies any abdominal pain. Does have frequent nausea & emesis. A pertinent 14 point review of systems is negative except as per the history of presenting illness.  No Known Allergies  Current Outpatient Prescriptions on File Prior to Visit  Medication Sig Dispense Refill  . albuterol (PROVENTIL HFA;VENTOLIN HFA) 108 (90 BASE) MCG/ACT inhaler Inhale 1-2 puffs into the lungs every 6 (six) hours as needed for wheezing or shortness of breath.    .  budesonide-formoterol (SYMBICORT) 160-4.5 MCG/ACT inhaler Inhale 2 puffs into the lungs 2 (two) times daily.    . cetirizine (ZYRTEC) 10 MG tablet Take 10 mg by mouth daily as needed.     . cyclobenzaprine (FLEXERIL) 10 MG tablet Take 10 mg by mouth 3 (three) times daily as needed for muscle spasms.    Marland Kitchen dexlansoprazole (DEXILANT) 60 MG capsule Take 1 capsule (60 mg total) by mouth daily. 20 capsule 0  . diclofenac (VOLTAREN) 75 MG EC tablet Take 75 mg by mouth 2 (two) times daily as needed.     . famotidine (PEPCID) 20 MG tablet Take 1 tablet (20 mg total) by mouth 2 (two) times daily. 14 tablet 0  . FLUoxetine (PROZAC) 40 MG capsule Take 40 mg by mouth daily.    Marland Kitchen gabapentin (NEURONTIN) 300 MG capsule Take 300 mg by mouth 3 (three) times daily as needed.     Marland Kitchen ibuprofen (ADVIL,MOTRIN) 600 MG tablet Take 600 mg by mouth every 6 (six) hours as needed. Reported on 11/26/2015    . IRON PO Take by mouth daily.    Marland Kitchen lamoTRIgine (LAMICTAL) 25 MG tablet Take 25 mg by mouth 2 (two) times daily.     . metoprolol tartrate (LOPRESSOR) 25 MG tablet Take 25 mg by mouth 2 (two) times daily.    Marland Kitchen omeprazole (PRILOSEC) 40 MG capsule Take 40 mg by mouth daily.    . promethazine (PHENERGAN) 25 MG tablet Take 1 tablet (25 mg total) by mouth every 6 (six)  hours as needed for nausea or vomiting. 16 tablet 0  . traMADol (ULTRAM) 50 MG tablet Take 50 mg by mouth every 8 (eight) hours as needed for moderate pain or severe pain.     . traZODone (DESYREL) 150 MG tablet Take 300 mg by mouth at bedtime as needed.      No current facility-administered medications on file prior to visit.    Past Medical History  Diagnosis Date  . Arthritis   . Back pain   . Bipolar 1 disorder ([place])   . GERD (gastroesophageal reflux disease)   . Diverticulitis   . Blood clots in brain   . Vomiting   . COPD (chronic obstructive pulmonary disease) ([place])   . Lung nodules   . Bipolar disorder ([place])   . Schizoaffective disorder, bipolar  type ([place])   . Heart disease     "irregular heart beat"  . Anemia   . Hyperlipidemia   . Hypertension     Past Surgical History  Procedure Laterality Date  . Brain surgery  2005    after head injury, patient states had 2 large blood clots evacuated  . Colonoscopy with esophagogastroduodenoscopy (egd) N/A 12/05/2013    [id number] erosive relfux/HH/melanosis coli/colonic diverticulosis. tubulovillous adenoma removed. next tcs 11/2016    Family History  Problem Relation Age of Onset  . Adopted: Yes  . Colon cancer Neg Hx     adopted at 37 months old, unsure about GI history of parents   . Alzheimer's disease Other   . Parkinson's disease Other   . Mental illness Other     Social History   Social History  . Marital Status: Divorced    Spouse Name: N/A  . Number of Children: 1  . Years of Education: N/A   Occupational History  . disabled    Social History Main Topics  . Smoking status: Former Smoker -- 0.75 packs/day for 25 years    Types: Cigars, Cigarettes    Quit date: 07/09/2014  . Smokeless tobacco: Current User    Types: Snuff     Comment: pt uses dip tobacco  . Alcohol Use: 0.0 oz/week    0 Standard drinks or equivalent per week     Comment: "once in awhile" average 1-2 times a week with 1-2 drinks per sitting  . Drug Use: No  . Sexual Activity: Not Asked   Other Topics Concern  . None   Social History Narrative   He is originally from [place]. 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 Has always lived in [place]. Previously have traveled to [place], [place], [place], [place], [place]. Remote travel to [place]. 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 Lived in a house with mold around 2001-2002 as well as 2006. Has 2 dogs currently. No bird exposure. No hot tub exposure. Mainly walks for fun. He was incarcerated for 2 years previously. No known TB exposure. Had previous negative PPDs last around 2010. Never lived in a homeless shelter but has eaten at them before.        Objective:   Physical Exam BP 122/76 mmHg  Pulse 81  Ht 5\' 11"  (1.803 m)  Wt 194 lb 6.4 oz (88.179 kg)  BMI 27.13 kg/m2  SpO2 93% General:  Awake. Alert. No acute distress. Father with him today.  Integument:  Warm & dry. No rash on exposed skin. No bruising. Multiple tattoos noted.  Lymphatics:  No appreciated cervical or supraclavicular lymphadenoapthy. HEENT:  Moist mucus  membranes. No oral ulcers. No scleral injection or icterus. PERRL. Cardiovascular:  Regular rate. No edema. No appreciable JVD.  Pulmonary:  Good aeration & clear to auscultation bilaterally. Symmetric chest wall expansion. No accessory muscle use on room air. Abdomen: Soft. Normal bowel sounds. Protuberant. Grossly nontender. Musculoskeletal:  Normal bulk and tone. Hand grip strength 5/5 bilaterally. No joint deformity or effusion appreciated. Neurological:  CN 2-12 grossly in tact. No meningismus. Moving all 4 extremities equally. Symmetric brachioradialis deep tendon reflexes. Psychiatric:  Mood and affect congruent. Speech normal rhythm, rate & tone.   IMAGING CT CHEST W/O 10/20/15 (personally reviewed by me): Pulmonary nodularity diffusely in bronchovascular distribution width 1.4 cm groundglass nodule left upper lobe. Additional subcentimeter groundglass nodules noted.  Cystic changes noted bilaterally with largest within right lower lobe. Subcentimeter mediastinal lymph nodes without pathologic enlargement. No pleural effusion or thickening. No pericardial effusion. No esophageal dilation or mucosal thickening. Nodules appear essentially unchanged when compared with CT scan from 2015. There do appear to be more cystic areas within the lungs on my review.  LABS 11/05/15 CBC: 10.4/15.3/44.1/250 BMP: 141/4.7/103/28/21/1.46/108/9.7 LFT: 4.2/7.6/0.5/45/28/44     Assessment & Plan:  45 year old Caucasian male with bilateral pulmonary nodules as well as a few cysts within his lungs. Previously diagnosed with  COPD but unsure if he has had any pulmonary function testing to confirm this. Certainly he would be at risk for a smoking-related lung disease as a potential cause for his cystic changes. Lower lobe predominant and patient's age argues against cystic fibrosis and the pattern on his imaging is not consistent with this either. Autoimmune diseases such as Sjogren's, rheumatoid, etc. would certainly be possible. However, he has no physical symptoms or findings that would suggest an underlying autoimmune disease. Certainly his previous exposures would lend to the possibility for an infectious etiology, but again he has no infectious symptoms. Malignancy is unlikely given the stability of his nodules but certainly his left upper lobe groundglass nodule will need to be followed further. We did briefly discuss the potential need for a surgical lung biopsy if serum workup is unrevealing. I instructed the patient contact my office for any questions or concerns before his next appointment.  1. Bilateral pulmonary nodules: Checking autoimmune workup with ANA with reflex to comprehensive panel, rheumatoid factor, anti-CCP, SSA, and SSB. May require surgical lung biopsy. Checking serum HIV & QuantiFERON-TB. Also placing skin PPD. 2. Cystic lung disease: Question possible smoking-related lung disease. Checking alpha-1 antitrypsin. May require surgical lung biopsy. 3. COPD: Continuing patient on Symbicort. Screening for alpha-1 antitrypsin. Full pulmonary function testing and 6 minute walk test on room air at next appointment. 4. Follow-up: Patient to return to clinic in 2-4 weeks or sooner if needed.

## 2015-12-22 NOTE — Telephone Encounter (Signed)
Called and left message for patient to call back. Only openings I can see for PFT/[facility]/6MW is starting May 4th.   Dr. Ashok Cordia is May 4th okay?

## 2015-12-22 NOTE — Telephone Encounter (Signed)
Pt stopped by to see if he could get Align samples. Pt is aware that we do not have any samples and if we get any in the future we would call him. [id number]

## 2015-12-22 NOTE — Telephone Encounter (Signed)
Samples of align come to [place] in the mail. I dont have any control over when we get them. We are out of samples at this time, I dont know when we will get anymore. They are available over the counter at any drug store. Per Rosendo Gros, please call pt and advise them that they will have to check back with [place] periodically to see if we have any.

## 2015-12-23 LAB — ANA W/REFLEX: ANA: NEGATIVE

## 2015-12-23 NOTE — Telephone Encounter (Signed)
Shawn [name], please make sure we call the patient to let them know.  I don't want them to be waiting on a call from [place].

## 2015-12-23 NOTE — Telephone Encounter (Signed)
Pt requested that all testing be done on the same day and agreed to have testing at [facility].  6MWT will be at [place] at 11:00 PFT at [facility] @ 1pm f/u with JN @ 2pm  Will send to [facility] as FYI.

## 2015-12-23 NOTE — Telephone Encounter (Signed)
I called patient this morning and told him Align was available OTC and he could call back every so often to see if we have samples, but at the moment we were out.

## 2015-12-24 ENCOUNTER — Telehealth: Payer: Self-pay | Admitting: Pulmonary Disease

## 2015-12-24 LAB — TB SKIN TEST
Induration: 0 mm
TB Skin Test: NEGATIVE

## 2015-12-24 LAB — RHEUMATOID FACTOR: RHEUMATOID FACTOR: 10 [IU]/mL (ref <=14)

## 2015-12-24 LAB — HIV ANTIBODY (ROUTINE TESTING W REFLEX): HIV: NONREACTIVE

## 2015-12-24 NOTE — Telephone Encounter (Signed)
I have documented on results

## 2015-12-25 ENCOUNTER — Ambulatory Visit: Payer: Self-pay | Admitting: Physician Assistant

## 2015-12-25 ENCOUNTER — Encounter: Payer: Self-pay | Admitting: Physician Assistant

## 2015-12-25 ENCOUNTER — Telehealth: Payer: Self-pay | Admitting: Pulmonary Disease

## 2015-12-25 VITALS — BP 122/86 | HR 64 | Temp 98.2°F | Ht 71.0 in | Wt 192.2 lb

## 2015-12-25 DIAGNOSIS — E785 Hyperlipidemia, unspecified: Secondary | ICD-10-CM

## 2015-12-25 DIAGNOSIS — I10 Essential (primary) hypertension: Secondary | ICD-10-CM

## 2015-12-25 LAB — QUANTIFERON TB GOLD ASSAY (BLOOD)
INTERFERON GAMMA RELEASE ASSAY: NEGATIVE
Mitogen value: 5.23 IU/mL
Quantiferon Nil Value: 0.05 IU/mL
Quantiferon Tb Ag Minus Nil Value: -0.02 IU/mL
TB AG VALUE: 0.03 [IU]/mL

## 2015-12-25 LAB — SJOGRENS SYNDROME-B EXTRACTABLE NUCLEAR ANTIBODY: SSB (LA) (ENA) ANTIBODY, IGG: NEGATIVE

## 2015-12-25 LAB — SJOGRENS SYNDROME-A EXTRACTABLE NUCLEAR ANTIBODY: SSA (RO) (ENA) ANTIBODY, IGG: NEGATIVE

## 2015-12-25 LAB — CYCLIC CITRUL PEPTIDE ANTIBODY, IGG

## 2015-12-25 NOTE — Telephone Encounter (Signed)
LMTCB x1 for [employer]

## 2015-12-25 NOTE — Telephone Encounter (Signed)
Called spoke with Caryl Pina (financial dept). Pt advised had TB test and she is unable to see this. I advised the TB skin test was negative. Nothing further needed

## 2015-12-25 NOTE — Progress Notes (Signed)
BP 122/86 mmHg  Pulse 64  Temp(Src) 98.2 F (36.8 C)  Ht 5\' 11"  (1.803 m)  Wt 192 lb 3.2 oz (87.181 kg)  BMI 26.82 kg/m2  SpO2 98%   Subjective:    Patient ID: Shawn [name], male    DOB: 11-17-1970, 45 y.o.   MRN: [id number]  HPI: Shawn [name] is a 45 y.o. male presenting on 12/25/2015 for Hypertension   HPI   -GERD/diarrhea- being treated by GI -Bipolar d/o- being treated by [facility] -Abnormal CT chest- being evaluated by pulmonologist -COPD- pt on inhalers  Pt says he is feeling well today.  Not much sob. No cp.   He is eatiing lowfat diet for cholesterol.    Relevant past medical, surgical, family and social history reviewed and updated as indicated. Interim medical history since our last visit reviewed. Allergies and medications reviewed and updated.   Current outpatient prescriptions:  .  albuterol (PROVENTIL HFA;VENTOLIN HFA) 108 (90 BASE) MCG/ACT inhaler, Inhale 1-2 puffs into the lungs every 6 (six) hours as needed for wheezing or shortness of breath., Disp: , Rfl:  .  budesonide-formoterol (SYMBICORT) 160-4.5 MCG/ACT inhaler, Inhale 2 puffs into the lungs 2 (two) times daily., Disp: , Rfl:  .  cyclobenzaprine (FLEXERIL) 10 MG tablet, Take 10 mg by mouth 3 (three) times daily as needed for muscle spasms., Disp: , Rfl:  .  dexlansoprazole (DEXILANT) 60 MG capsule, Take 1 capsule (60 mg total) by mouth daily., Disp: 20 capsule, Rfl: 0 .  diclofenac (VOLTAREN) 75 MG EC tablet, Take 75 mg by mouth 2 (two) times daily as needed. , Disp: , Rfl:  .  FLUoxetine (PROZAC) 40 MG capsule, Take 40 mg by mouth daily., Disp: , Rfl:  .  gabapentin (NEURONTIN) 300 MG capsule, Take 300 mg by mouth 3 (three) times daily as needed. , Disp: , Rfl:  .  ibuprofen (ADVIL,MOTRIN) 600 MG tablet, Take 600 mg by mouth every 6 (six) hours as needed. Reported on 11/26/2015, Disp: , Rfl:  .  IRON PO, Take by mouth daily., Disp: , Rfl:  .  lamoTRIgine (LAMICTAL) 25 MG tablet, Take 25 mg by  mouth 2 (two) times daily. , Disp: , Rfl:  .  metoprolol tartrate (LOPRESSOR) 25 MG tablet, Take 25 mg by mouth 2 (two) times daily., Disp: , Rfl:  .  promethazine (PHENERGAN) 25 MG tablet, Take 1 tablet (25 mg total) by mouth every 6 (six) hours as needed for nausea or vomiting., Disp: 16 tablet, Rfl: 0 .  traMADol (ULTRAM) 50 MG tablet, Take 50 mg by mouth every 8 (eight) hours as needed for moderate pain or severe pain. , Disp: , Rfl:  .  traZODone (DESYREL) 150 MG tablet, Take 300 mg by mouth at bedtime as needed. , Disp: , Rfl:    Review of Systems  Per HPI unless specifically indicated above     Objective:    BP 122/86 mmHg  Pulse 64  Temp(Src) 98.2 F (36.8 C)  Ht 5\' 11"  (1.803 m)  Wt 192 lb 3.2 oz (87.181 kg)  BMI 26.82 kg/m2  SpO2 98%  Wt Readings from Last 3 Encounters:  12/25/15 192 lb 3.2 oz (87.181 kg)  12/22/15 194 lb 6.4 oz (88.179 kg)  11/26/15 192 lb (87.091 kg)    Physical Exam  Constitutional: He is oriented to person, place, and time. He appears well-developed and well-nourished.  HENT:  Head: Normocephalic and atraumatic.  Neck: Neck supple.  Cardiovascular: Normal rate and regular  rhythm.   Pulmonary/Chest: Effort normal and breath sounds normal. He has no wheezes.  Abdominal: Soft. Bowel sounds are normal. There is no hepatosplenomegaly. There is no tenderness.  Musculoskeletal: He exhibits no edema.  Lymphadenopathy:    He has no cervical adenopathy.  Neurological: He is alert and oriented to person, place, and time.  Skin: Skin is warm and dry.  Psychiatric: He has a normal mood and affect. His behavior is normal.  Vitals reviewed.       Assessment & Plan:   Encounter Diagnoses  Name Primary?  . Essential hypertension, benign Yes  . Hyperlipidemia      -continue current meds -gave pt 5wk supply of align samples (recommended by GI) -continue lowfat diet for lipids -continue with specialists -f/u here 3 months. RTO sooner prn

## 2015-12-25 NOTE — Telephone Encounter (Signed)
Return call.Shawn [name] °

## 2015-12-29 LAB — ALPHA-1 ANTITRYPSIN PHENOTYPE: A-1 Antitrypsin: 136 mg/dL (ref 83–199)

## 2016-01-06 ENCOUNTER — Ambulatory Visit (HOSPITAL_COMMUNITY)
Admission: RE | Admit: 2016-01-06 | Discharge: 2016-01-06 | Disposition: A | Discharge status: Home / Self Care | Payer: Medicaid Other | Source: Ambulatory Visit | Attending: Pulmonary Disease | Admitting: Pulmonary Disease

## 2016-01-06 ENCOUNTER — Ambulatory Visit (INDEPENDENT_AMBULATORY_CARE_PROVIDER_SITE_OTHER): Payer: Medicaid Other | Admitting: Pulmonary Disease

## 2016-01-06 ENCOUNTER — Encounter: Payer: Self-pay | Admitting: Pulmonary Disease

## 2016-01-06 ENCOUNTER — Encounter (HOSPITAL_COMMUNITY): Payer: Self-pay

## 2016-01-06 VITALS — BP 114/72 | HR 102 | Ht 71.0 in | Wt 192.0 lb

## 2016-01-06 DIAGNOSIS — R0609 Other forms of dyspnea: Secondary | ICD-10-CM | POA: Diagnosis not present

## 2016-01-06 DIAGNOSIS — R0600 Dyspnea, unspecified: Secondary | ICD-10-CM

## 2016-01-06 DIAGNOSIS — J984 Other disorders of lung: Secondary | ICD-10-CM | POA: Diagnosis not present

## 2016-01-06 DIAGNOSIS — J449 Chronic obstructive pulmonary disease, unspecified: Secondary | ICD-10-CM | POA: Insufficient documentation

## 2016-01-06 DIAGNOSIS — R918 Other nonspecific abnormal finding of lung field: Secondary | ICD-10-CM | POA: Diagnosis not present

## 2016-01-06 LAB — PULMONARY FUNCTION TEST
DL/VA % pred: 120 %
DL/VA: 5.69 ml/min/mmHg/L
DLCO unc % pred: 103 %
DLCO unc: 34.98 ml/min/mmHg
FEF 25-75 Post: 4.75 L/sec
FEF 25-75 Pre: 4.78 L/sec
FEF2575-%CHANGE-POST: 0 %
FEF2575-%PRED-PRE: 125 %
FEF2575-%Pred-Post: 124 %
FEV1-%CHANGE-POST: 0 %
FEV1-%PRED-POST: 91 %
FEV1-%PRED-PRE: 92 %
FEV1-PRE: 3.86 L
FEV1-Post: 3.83 L
FEV1FVC-%CHANGE-POST: 1 %
FEV1FVC-%Pred-Pre: 104 %
FEV6-%Change-Post: -1 %
FEV6-%PRED-PRE: 88 %
FEV6-%Pred-Post: 87 %
FEV6-POST: 4.53 L
FEV6-PRE: 4.6 L
FEV6FVC-%Change-Post: 1 %
FEV6FVC-%PRED-POST: 102 %
FEV6FVC-%PRED-PRE: 101 %
FVC-%CHANGE-POST: -2 %
FVC-%PRED-PRE: 87 %
FVC-%Pred-Post: 84 %
FVC-POST: 4.54 L
FVC-PRE: 4.66 L
POST FEV6/FVC RATIO: 100 %
PRE FEV1/FVC RATIO: 83 %
PRE FEV6/FVC RATIO: 99 %
Post FEV1/FVC ratio: 84 %
RV % pred: 66 %
RV: 1.32 L
TLC % PRED: 94 %
TLC: 6.74 L

## 2016-01-06 MED ORDER — ALBUTEROL SULFATE (2.5 MG/3ML) 0.083% IN NEBU
2.5000 mg | INHALATION_SOLUTION | Freq: Once | RESPIRATORY_TRACT | Status: AC
  Administered 2016-01-06: 2.5 mg via RESPIRATORY_TRACT

## 2016-01-06 NOTE — Progress Notes (Signed)
Subjective:    Patient ID: Shawn [name], male    DOB: 05-20-1971, 45 y.o.   MRN: [id number]  [facility]  Follow-up for Bilateral Lung Nodules, Cystic Lung Disease & COPD.  HPI  Bilateral Lung Nodules:  Noted on CT imaging. Serum autoimmune workup is negative.  Follows peribronchovascular distribution.  Cystic Lung Disease:  Serum autoimmune workup negative.  Mid and lower lung zone predominant.  COPD:  Pulmonary function testing shows no evidence for airway obstruction on spirometry.Alpha-1 antitrypsin testing was negative. Continues to have intermittent coughing, mostly in the morning and attributed to his post-nasal drainage. He does wheeze occasionally. Compliant with Symbicort. No change in his symptoms with albuterol prn. No recurrent bronchitis. Hasn't noticed any symptomatic benefit on Symbicort.   Review of Systems He does have some chest tightness & pressure after his 6MWT & PFTs this morning. No fever, chills, or sweats. He does have occasional diaphoresis.   No Known Allergies  Current Outpatient Prescriptions on File Prior to Visit  Medication Sig Dispense Refill  . albuterol (PROVENTIL HFA;VENTOLIN HFA) 108 (90 BASE) MCG/ACT inhaler Inhale 1-2 puffs into the lungs every 6 (six) hours as needed for wheezing or shortness of breath.    . budesonide-formoterol (SYMBICORT) 160-4.5 MCG/ACT inhaler Inhale 2 puffs into the lungs 2 (two) times daily.    . cyclobenzaprine (FLEXERIL) 10 MG tablet Take 10 mg by mouth 3 (three) times daily as needed for muscle spasms.    Marland Kitchen dexlansoprazole (DEXILANT) 60 MG capsule Take 1 capsule (60 mg total) by mouth daily. 20 capsule 0  . diclofenac (VOLTAREN) 75 MG EC tablet Take 75 mg by mouth 2 (two) times daily as needed.     Marland Kitchen FLUoxetine (PROZAC) 40 MG capsule Take 40 mg by mouth daily.    Marland Kitchen gabapentin (NEURONTIN) 300 MG capsule Take 300 mg by mouth 3 (three) times daily as needed.     Marland Kitchen ibuprofen (ADVIL,MOTRIN) 600 MG tablet Take 600 mg by mouth  every 6 (six) hours as needed. Reported on 11/26/2015    . IRON PO Take by mouth daily.    Marland Kitchen lamoTRIgine (LAMICTAL) 25 MG tablet Take 25 mg by mouth 2 (two) times daily.     . metoprolol tartrate (LOPRESSOR) 25 MG tablet Take 25 mg by mouth 2 (two) times daily.    . promethazine (PHENERGAN) 25 MG tablet Take 1 tablet (25 mg total) by mouth every 6 (six) hours as needed for nausea or vomiting. 16 tablet 0  . traMADol (ULTRAM) 50 MG tablet Take 50 mg by mouth every 8 (eight) hours as needed for moderate pain or severe pain.     . traZODone (DESYREL) 150 MG tablet Take 300 mg by mouth at bedtime as needed.      No current facility-administered medications on file prior to visit.    Past Medical History  Diagnosis Date  . Arthritis   . Back pain   . Bipolar 1 disorder ([place])   . GERD (gastroesophageal reflux disease)   . Diverticulitis   . Blood clots in brain   . Vomiting   . COPD (chronic obstructive pulmonary disease) ([place])   . Lung nodules   . Bipolar disorder ([place])   . Schizoaffective disorder, bipolar type ([place])   . Heart disease     "irregular heart beat"  . Anemia   . Hyperlipidemia   . Hypertension     Past Surgical History  Procedure Laterality Date  . Brain surgery  2005  after head injury, patient states had 2 large blood clots evacuated  . Colonoscopy with esophagogastroduodenoscopy (egd) N/A 12/05/2013    [id number] erosive relfux/HH/melanosis coli/colonic diverticulosis. tubulovillous adenoma removed. next tcs 11/2016    Family History  Problem Relation Age of Onset  . Adopted: Yes  . Colon cancer Neg Hx     adopted at 8 months old, unsure about GI history of parents   . Alzheimer's disease Other   . Parkinson's disease Other   . Mental illness Other     Social History   Social History  . Marital Status: Divorced    Spouse Name: N/A  . Number of Children: 1  . Years of Education: N/A   Occupational History  . disabled    Social History Main Topics    . Smoking status: Former Smoker -- 0.75 packs/day for 25 years    Types: Cigars, Cigarettes    Quit date: 07/09/2014  . Smokeless tobacco: Current User    Types: Snuff     Comment: pt uses dip tobacco  . Alcohol Use: 0.0 oz/week    0 Standard drinks or equivalent per week     Comment: "once in awhile" average 1-2 times a week with 1-2 drinks per sitting  . Drug Use: No  . Sexual Activity: Not Asked   Other Topics Concern  . None   Social History Narrative   He is originally from [place]. 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 Has always lived in [place]. Previously have traveled to [place], [place], [place], [place], [place]. Remote travel to [place]. 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 Lived in a house with mold around 2001-2002 as well as 2006. Has 2 dogs currently. No bird exposure. No hot tub exposure. Mainly walks for fun. He was incarcerated for 2 years previously. No known TB exposure. Had previous negative PPDs last around 2010. Never lived in a homeless shelter but has eaten at them before.       Objective:   Physical Exam BP 114/72 mmHg  Pulse 102  Ht 5\' 11"  (1.803 m)  Wt 192 lb (87.091 kg)  BMI 26.79 kg/m2  SpO2 96% General:  Awake. Alert. Father with him today. Comfortable. Integument:  Warm & dry. No rash on exposed skin. Tattoos noted.  Lymphatics:  No appreciated cervical or supraclavicular lymphadenoapthy. HEENT:  Moist mucus membranes. No oral ulcers. No scleral injection or icterus. Cardiovascular:  Regular rate. No edema. Normal S1 & S2. Pulmonary:  Clear bilaterally to auscultation bilaterally. Symmetric chest wall expansion. No accessory muscle use on room air. Musculoskeletal:  No joint effusion or deformity appreciated. Normal bulk and tone.  PFT 01/06/16: FVC 4.66 L (87%) FEV1 3.86 L (92%) FEV1/FVC 0.83 FEF 25-75 4.78 L (125%) no bronchodilator response TLC 6.74 L (94%) RV 66% ERV 23% DLCO uncorrected 103%  6MWT 01/06/16:  Walked 528 meters /  Baseline Sat 97% on RA / Nadir 97% on RA @ rest (c/o pain in right rib cage, clammy sensation & tightness in chest)  IMAGING CT CHEST W/O 10/20/15 (previously reviewed by me): Pulmonary nodularity diffusely in bronchovascular distribution width 1.4 cm groundglass nodule left upper lobe. Additional subcentimeter groundglass nodules noted.  Cystic changes noted bilaterally with largest within right lower lobe. Subcentimeter mediastinal lymph nodes without pathologic enlargement. No pleural effusion or thickening. No pericardial effusion. No esophageal dilation or mucosal thickening. Nodules appear essentially unchanged when compared with CT scan from 2015. There do appear to be more  cystic areas within the lungs on my review.  LABS 12/22/15 Alpha-1 antitrypsin: MM (136) ANA: Negative Rheumatoid factor: 10 Anti-CCP:  <16 SSA:  <1 SSB:  <1 Quantiferon TB:  Negative (PPD negative) HIV:  Negative   11/05/15 CBC: 10.4/15.3/44.1/250 BMP: 141/4.7/103/28/21/1.46/108/9.7 LFT: 4.2/7.6/0.5/45/28/44    Assessment & Plan:  45 year old Caucasian male with bilateral pulmonary nodules as well as a few cysts within his lungs. Patient's ongoing dyspnea is perplexing and could be related to his underlying lung nodules and cysts. However, upon reviewing his pulmonary function testing today there is no evidence for any airways obstruction that would suggest COPD Additional the patient had stable blood pressure, heart rate, and no evidence of desaturation during his 6 prolonged test either. I do feel cardiopulmonary exercise testing is the next logical step to evaluate the potential source for his ongoing dyspnea on exertion. With regards lung nodules and cysts seen on prior CT imaging I again reviewed the patient's CT scan today nd feel the most direct next would be to obtain a surgical lung biopsy through VATS.The patient has no infectious symptoms at this time and I feel doing a bronchoscopy with transbronchial  biopsy may simply delay diagnosis.However, I am interested in what the thoracic surgeon has to say.  Sarcoidosis remains within the differential but naturally is a diagnosis of exclusion. I instructed the patient to contact me for any new symptoms before his next appointment.  1. Bilateral pulmonary nodules: Referring patient to Thoracic Surgery to consider surgical lung biopsy with VATS. 2. Cystic lung disease: Question possible smoking-related lung disease. Referring to thoracic surgery for biopsy. 3. Dyspnea:  Patient has no evidence of COPD on spirometry today. Discontinuing Symbicort. Ordering CPET. 4. Follow-up: Patient to return to clinic in 1-2 months or sooner if needed.  Sonia Baller Ashok Cordia, M.D. [facility] Pager:  [phone] After 3pm or if no response, call [phone] 3:14 PM 01/06/2016

## 2016-01-06 NOTE — Patient Instructions (Addendum)
   I will see you back after your exercise test and after you've seen the thoracic surgeon.  Call me if you have any new symptoms or breathing problems.  You can stop using your Symbicort.  TESTS ORDERED: 1. CPET

## 2016-01-07 ENCOUNTER — Emergency Department (HOSPITAL_COMMUNITY): Payer: Medicaid Other

## 2016-01-07 ENCOUNTER — Encounter (HOSPITAL_COMMUNITY): Payer: Self-pay | Admitting: Emergency Medicine

## 2016-01-07 ENCOUNTER — Ambulatory Visit: Payer: Self-pay

## 2016-01-07 ENCOUNTER — Ambulatory Visit: Payer: Self-pay | Admitting: Pulmonary Disease

## 2016-01-07 ENCOUNTER — Telehealth: Payer: Self-pay | Admitting: Pulmonary Disease

## 2016-01-07 ENCOUNTER — Emergency Department (HOSPITAL_COMMUNITY)
Admission: EM | Admit: 2016-01-07 | Discharge: 2016-01-07 | Disposition: A | Discharge status: Home / Self Care | Payer: Medicaid Other | Attending: Emergency Medicine | Admitting: Emergency Medicine

## 2016-01-07 DIAGNOSIS — R0789 Other chest pain: Secondary | ICD-10-CM | POA: Insufficient documentation

## 2016-01-07 DIAGNOSIS — I10 Essential (primary) hypertension: Secondary | ICD-10-CM | POA: Diagnosis not present

## 2016-01-07 DIAGNOSIS — J449 Chronic obstructive pulmonary disease, unspecified: Secondary | ICD-10-CM | POA: Diagnosis not present

## 2016-01-07 DIAGNOSIS — E785 Hyperlipidemia, unspecified: Secondary | ICD-10-CM | POA: Diagnosis not present

## 2016-01-07 DIAGNOSIS — Z87891 Personal history of nicotine dependence: Secondary | ICD-10-CM | POA: Insufficient documentation

## 2016-01-07 DIAGNOSIS — Z79899 Other long term (current) drug therapy: Secondary | ICD-10-CM | POA: Insufficient documentation

## 2016-01-07 DIAGNOSIS — F319 Bipolar disorder, unspecified: Secondary | ICD-10-CM | POA: Diagnosis not present

## 2016-01-07 LAB — BASIC METABOLIC PANEL
Anion gap: 10 (ref 5–15)
BUN: 19 mg/dL (ref 6–20)
CHLORIDE: 101 mmol/L (ref 101–111)
CO2: 25 mmol/L (ref 22–32)
CREATININE: 0.99 mg/dL (ref 0.61–1.24)
Calcium: 9.3 mg/dL (ref 8.9–10.3)
GFR calc non Af Amer: >60 mL/min (ref >60)
Glucose, Bld: 93 mg/dL (ref 65–99)
Potassium: 4 mmol/L (ref 3.5–5.1)
SODIUM: 136 mmol/L (ref 135–145)

## 2016-01-07 LAB — D-DIMER, QUANTITATIVE (NOT AT ARMC)

## 2016-01-07 LAB — CBC WITH DIFFERENTIAL/PLATELET
BASOS PCT: 1 %
Basophils Absolute: 0.1 10*3/uL (ref 0.0–0.1)
EOS ABS: 0.2 10*3/uL (ref 0.0–0.7)
Eosinophils Relative: 2 %
HCT: 46.4 % (ref 39.0–52.0)
HEMOGLOBIN: 15.8 g/dL (ref 13.0–17.0)
Lymphocytes Relative: 19 %
Lymphs Abs: 1.9 10*3/uL (ref 0.7–4.0)
MCH: 31.9 pg (ref 26.0–34.0)
MCHC: 34.1 g/dL (ref 30.0–36.0)
MCV: 93.7 fL (ref 78.0–100.0)
MONOS PCT: 7 %
Monocytes Absolute: 0.7 10*3/uL (ref 0.1–1.0)
NEUTROS PCT: 71 %
Neutro Abs: 7.3 10*3/uL (ref 1.7–7.7)
Platelets: 265 10*3/uL (ref 150–400)
RBC: 4.95 MIL/uL (ref 4.22–5.81)
RDW: 12.4 % (ref 11.5–15.5)
WBC: 10.1 10*3/uL (ref 4.0–10.5)

## 2016-01-07 MED ORDER — HYDROCODONE-ACETAMINOPHEN 5-325 MG PO TABS: ORAL_TABLET | ORAL | Status: DC

## 2016-01-07 NOTE — ED Notes (Signed)
Having non-productive cough for one week, with chest tightness for one week as well.

## 2016-01-07 NOTE — Discharge Instructions (Signed)
Chest Wall Pain °Chest wall pain is pain in or around the bones and muscles of your chest. Sometimes, an injury causes this pain. Sometimes, the cause may not be known. This pain may take several weeks or longer to get better. °HOME CARE °Pay attention to any changes in your symptoms. Take these actions to help with your pain: °· Rest as told by your doctor. °· Avoid activities that cause pain. Try not to use your chest, belly (abdominal), or side muscles to lift heavy things. °· If directed, apply ice to the painful area: °¨ Put ice in a plastic bag. °¨ Place a towel between your skin and the bag. °¨ Leave the ice on for 20 minutes, 2-3 times per day. °· Take over-the-counter and prescription medicines only as told by your doctor. °· Do not use tobacco products, including cigarettes, chewing tobacco, and e-cigarettes. If you need help quitting, ask your doctor. °· Keep all follow-up visits as told by your doctor. This is important. °GET HELP IF: °· You have a fever. °· Your chest pain gets worse. °· You have new symptoms. °GET HELP RIGHT AWAY IF: °· You feel sick to your stomach (nauseous) or you throw up (vomit). °· You feel sweaty or light-headed. °· You have a cough with phlegm (sputum) or you cough up blood. °· You are short of breath. °  °This information is not intended to replace advice given to you by your health care provider. Make sure you discuss any questions you have with your health care provider. °  °Document Released: 04/12/2008 Document Revised: 07/16/2015 Document Reviewed: 01/20/2015 °Elsevier Interactive Patient Education ©2016 Elsevier Inc. ° °

## 2016-01-07 NOTE — Telephone Encounter (Signed)
Patient appears to have presented to the E.D. At 11:48 this morning. They should be able to evaluate this appropriately.

## 2016-01-07 NOTE — Telephone Encounter (Signed)
Spoke with pt, c/o chest tightness, chest pain X1 week.  Pt was seen yesterday in office visit, was taken off Symbicort at yesterday's ov.   Pt wants to know what he is supposed to do for his chest tightness/pain-has intermittent sharp pains, continual tightness, and R sided back pain behind shoulder blade.    Pt uses [facility] in [place] please advise on recs.  Thanks!

## 2016-01-07 NOTE — Progress Notes (Signed)
Tests reviewed. 

## 2016-01-08 NOTE — ED Provider Notes (Signed)
CSN: [id number]     Arrival date & time 01/07/16  1143 History   First MD Initiated Contact with Patient 01/07/16 1238     Chief Complaint  Patient presents with  . Cough     (Consider location/radiation/quality/duration/timing/severity/associated sxs/prior Treatment) HPI   Shawn [name] is a 45 y.o. male who presents to the Emergency Department complaining of persistent nonproductive cough for one week. He describes a "tightness" to the right chest that is worse with cough and deep breathing. he states pain is intermittent. He states he has a history of nodules to his left long and is currently under the care of a pulmonologist, and is scheduled to have a biopsy performed next week.  Nothing makes the symptoms better, coughing makes it worse.  He denies fever, chills, shortness of breath, sore throat or body aches. He also states that his pulmonologist recently discontinued his Symbicort   Past Medical History  Diagnosis Date  . Arthritis   . Back pain   . Bipolar 1 disorder ([place])   . GERD (gastroesophageal reflux disease)   . Diverticulitis   . Blood clots in brain   . Vomiting   . COPD (chronic obstructive pulmonary disease) ([place])   . Lung nodules   . Bipolar disorder ([place])   . Schizoaffective disorder, bipolar type ([place])   . Heart disease     "irregular heart beat"  . Anemia   . Hyperlipidemia   . Hypertension    Past Surgical History  Procedure Laterality Date  . Brain surgery  2005    after head injury, patient states had 2 large blood clots evacuated  . Colonoscopy with esophagogastroduodenoscopy (egd) N/A 12/05/2013    [id number] erosive relfux/HH/melanosis coli/colonic diverticulosis. tubulovillous adenoma removed. next tcs 11/2016   Family History  Problem Relation Age of Onset  . Adopted: Yes  . Colon cancer Neg Hx     adopted at 52 months old, unsure about GI history of parents   . Alzheimer's disease Other   . Parkinson's disease Other   . Mental illness  Other    Social History  Substance Use Topics  . Smoking status: Former Smoker -- 0.75 packs/day for 25 years    Types: Cigars, Cigarettes    Quit date: 07/09/2014  . Smokeless tobacco: Current User    Types: Snuff     Comment: pt uses dip tobacco  . Alcohol Use: 0.0 oz/week    0 Standard drinks or equivalent per week     Comment: "once in awhile" average 1-2 times a week with 1-2 drinks per sitting    Review of Systems  Constitutional: Negative for fever, chills and appetite change.  HENT: Negative for congestion, sore throat and trouble swallowing.   Respiratory: Positive for cough and chest tightness. Negative for shortness of breath and wheezing.   Cardiovascular: Negative for chest pain.  Gastrointestinal: Negative for nausea, vomiting and abdominal pain.  Genitourinary: Negative for dysuria.  Musculoskeletal: Negative for arthralgias and neck pain.  Skin: Negative for rash.  Neurological: Negative for dizziness, weakness and numbness.  Hematological: Negative for adenopathy.  All other systems reviewed and are negative.     Allergies  Review of patient's allergies indicates no known allergies.  Home Medications   Prior to Admission medications   Medication Sig Start Date End Date Taking? Authorizing Provider  albuterol (PROVENTIL HFA;VENTOLIN HFA) 108 (90 BASE) MCG/ACT inhaler Inhale 1-2 puffs into the lungs every 6 (six) hours as needed for wheezing or shortness of  breath.    Historical Provider, MD  budesonide-formoterol (SYMBICORT) 160-4.5 MCG/ACT inhaler Inhale 2 puffs into the lungs 2 (two) times daily.    Historical Provider, MD  cyclobenzaprine (FLEXERIL) 10 MG tablet Take 10 mg by mouth 3 (three) times daily as needed for muscle spasms.    Historical Provider, MD  dexlansoprazole (DEXILANT) 60 MG capsule Take 1 capsule (60 mg total) by mouth daily. 07/03/15   Mahala Menghini, PA-C  diclofenac (VOLTAREN) 75 MG EC tablet Take 75 mg by mouth 2 (two) times daily as  needed.     Historical Provider, MD  FLUoxetine (PROZAC) 40 MG capsule Take 40 mg by mouth daily.    Historical Provider, MD  gabapentin (NEURONTIN) 300 MG capsule Take 300 mg by mouth 3 (three) times daily as needed.     Historical Provider, MD  HYDROcodone-acetaminophen (NORCO/VICODIN) 5-325 MG tablet Take one-two tabs po q 4-6 hrs prn pain 01/07/16   Daouda Lonzo, PA-C  ibuprofen (ADVIL,MOTRIN) 600 MG tablet Take 600 mg by mouth every 6 (six) hours as needed. Reported on 11/26/2015    Historical Provider, MD  IRON PO Take by mouth daily.    Historical Provider, MD  lamoTRIgine (LAMICTAL) 25 MG tablet Take 25 mg by mouth 2 (two) times daily.     Historical Provider, MD  metoprolol tartrate (LOPRESSOR) 25 MG tablet Take 25 mg by mouth 2 (two) times daily.    Historical Provider, MD  promethazine (PHENERGAN) 25 MG tablet Take 1 tablet (25 mg total) by mouth every 6 (six) hours as needed for nausea or vomiting. 11/05/15   Dorie Rank, MD  traMADol (ULTRAM) 50 MG tablet Take 50 mg by mouth every 8 (eight) hours as needed for moderate pain or severe pain.     Historical Provider, MD  traZODone (DESYREL) 150 MG tablet Take 300 mg by mouth at bedtime as needed.     Historical Provider, MD   BP 112/95 mmHg  Pulse 99  Temp(Src) 98.6 F (37 C) (Oral)  Resp 18  Ht 5\' 11"  (1.803 m)  Wt 87.091 kg  BMI 26.79 kg/m2  SpO2 98% Physical Exam  Constitutional: He is oriented to person, place, and time. He appears well-developed and well-nourished. No distress.  HENT:  Head: Normocephalic and atraumatic.  Right Ear: Tympanic membrane and ear canal normal.  Left Ear: Tympanic membrane and ear canal normal.  Mouth/Throat: Uvula is midline, oropharynx is clear and moist and mucous membranes are normal. No oropharyngeal exudate.  Eyes: EOM are normal. Pupils are equal, round, and reactive to light.  Neck: Normal range of motion, full passive range of motion without pain and phonation normal. Neck supple.   Cardiovascular: Normal rate, regular rhythm, normal heart sounds and intact distal pulses.   No murmur heard. Pulmonary/Chest: Effort normal. No stridor. No respiratory distress. He has no rales. He exhibits no tenderness.  Lungs are clear to auscultation bilaterally  Abdominal: Soft. He exhibits no distension. There is no tenderness. There is no rebound.  Musculoskeletal: Normal range of motion. He exhibits no edema.  Lymphadenopathy:    He has no cervical adenopathy.  Neurological: He is alert and oriented to person, place, and time. He exhibits normal muscle tone. Coordination normal.  Skin: Skin is warm and dry.  Nursing note and vitals reviewed.   ED Course  Procedures (including critical care time) Labs Review Labs Reviewed  CBC WITH DIFFERENTIAL/PLATELET  BASIC METABOLIC PANEL  D-DIMER, QUANTITATIVE (NOT AT [facility])    Imaging  Review Dg Chest 2 View  01/07/2016  CLINICAL DATA:  Chest pain and cough. EXAM: CHEST  2 VIEW COMPARISON:  08/17/2015 chest radiograph. FINDINGS: Stable cardiomediastinal silhouette with normal heart size. No pneumothorax. No pleural effusion. No acute consolidative airspace disease. Reticulonodular opacities throughout both lungs appears slightly less prominent. IMPRESSION: 1. No acute consolidative airspace disease to suggest a pneumonia. 2. 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 Electronically Signed   By: Ilona Sorrel M.D.   On: 01/07/2016 13:52   I have personally reviewed and evaluated these images and lab results as part of my medical decision-making.   EKG Interpretation   Date/Time:  Wednesday January 07 2016 13:30:54 EST Ventricular Rate:  64 PR Interval:  170 QRS Duration: 90 QT Interval:  388 QTC Calculation: 400 R Axis:   -6 Text Interpretation:  Normal sinus rhythm with sinus  arrhythmia Left  ventricular hypertrophy Abnormal ECG Confirmed by RAY MD, Andee Poles [id number])  on 01/07/2016 3:50:03 PM      MDM   Final diagnoses:  Chest wall pain    Patient well appearing. Vital signs stable. Labs are reassuring. D dimer neg.  Low clinical suspicion for ACS. Symptoms likely musculoskeletal.  Patient has an appointment with his pulmonologist next week for biopsy of the pulmonary nodules.  Discussed findings with Dr. Jeanell Sparrow.  Pt appears stable for d/c and agrees to symptomatic tx.  Return precautions given.      Kem Parkinson, PA-C 01/08/16 1147  Pattricia Boss, MD 01/08/16 [phone]

## 2016-01-13 ENCOUNTER — Ambulatory Visit (HOSPITAL_COMMUNITY): Payer: Medicaid Other | Attending: Pulmonary Disease

## 2016-01-13 DIAGNOSIS — R0600 Dyspnea, unspecified: Secondary | ICD-10-CM | POA: Diagnosis not present

## 2016-01-14 ENCOUNTER — Other Ambulatory Visit: Payer: Self-pay | Admitting: *Deleted

## 2016-01-14 ENCOUNTER — Encounter: Payer: Self-pay | Admitting: Surgery

## 2016-01-14 ENCOUNTER — Institutional Professional Consult (permissible substitution) (INDEPENDENT_AMBULATORY_CARE_PROVIDER_SITE_OTHER): Payer: Medicaid Other | Admitting: Surgery

## 2016-01-14 VITALS — BP 128/88 | HR 76 | Resp 20 | Ht 71.0 in | Wt 192.0 lb

## 2016-01-14 DIAGNOSIS — R918 Other nonspecific abnormal finding of lung field: Secondary | ICD-10-CM | POA: Diagnosis not present

## 2016-01-17 ENCOUNTER — Encounter: Payer: Self-pay | Admitting: Surgery

## 2016-01-18 ENCOUNTER — Encounter: Payer: Self-pay | Admitting: Surgery

## 2016-01-18 NOTE — Progress Notes (Signed)
Cardiothoracic Surgery Consultatio  PCP is Soyla Dryer, PA-C Referring Provider is Javier Glazier, MD  Chief Complaint  Patient presents with  . Lung Lesion    Surgical eval, Chest CT 10/20/2015,PFT's 01/06/16    HPI:  The patient is a 45 year old former smoker who still dips and was found to have  lung nodules on a CT of the abdomen in 2014. A follow up CT scan of the chest in 01/2014 showed mid and lower lung zone predominant pattern of peribronchovascular and perifissural nodularity. There was a new 9 x 13 mm sub solid nodule in the anterior lingula that was new. His most recent follow up scan on 10/20/2015 was unchanged. He reports dyspnea with exertion for years such as walking up inclines. He has a nonproductive cough. He was put on Symbicort without improvement and has now stopped it. He has been evaluated by Dr. Ashok Cordia of pulmonary medicine. PFT's showed no evidence of airway obstruction.  Alpha-1 antitrypsin negative. CPX testing showed normal functional capacity with no clear ventilatory or circulatory limitations to exercise.  Past Medical History  Diagnosis Date  . Arthritis   . Back pain   . Bipolar 1 disorder ([place])   . GERD (gastroesophageal reflux disease)   . Diverticulitis   . Blood clots in brain   . Vomiting   . COPD (chronic obstructive pulmonary disease) ([place])   . Lung nodules   . Bipolar disorder ([place])   . Schizoaffective disorder, bipolar type ([place])   . Heart disease     "irregular heart beat"  . Anemia   . Hyperlipidemia   . Hypertension     Past Surgical History  Procedure Laterality Date  . Brain surgery  2005    after head injury, patient states had 2 large blood clots evacuated  . Colonoscopy with esophagogastroduodenoscopy (egd) N/A 12/05/2013    [id number] erosive relfux/HH/melanosis coli/colonic diverticulosis. tubulovillous adenoma removed. next tcs 11/2016    Family History  Problem Relation Age of Onset  . Adopted: Yes  . Colon  cancer Neg Hx     adopted at 25 months old, unsure about GI history of parents   . Alzheimer's disease Other   . Parkinson's disease Other   . Mental illness Other     Social History Social History  Substance Use Topics  . Smoking status: Former Smoker -- 0.75 packs/day for 25 years    Types: Cigars, Cigarettes    Quit date: 07/09/2014  . Smokeless tobacco: Current User    Types: Snuff     Comment: pt uses dip tobacco  . Alcohol Use: 0.0 oz/week    0 Standard drinks or equivalent per week     Comment: "once in awhile" average 1-2 times a week with 1-2 drinks per sitting    Current Outpatient Prescriptions  Medication Sig Dispense Refill  . albuterol (PROVENTIL HFA;VENTOLIN HFA) 108 (90 BASE) MCG/ACT inhaler Inhale 1-2 puffs into the lungs every 6 (six) hours as needed for wheezing or shortness of breath.    . cyclobenzaprine (FLEXERIL) 10 MG tablet Take 10 mg by mouth 3 (three) times daily as needed for muscle spasms.    Marland Kitchen dexlansoprazole (DEXILANT) 60 MG capsule Take 1 capsule (60 mg total) by mouth daily. 20 capsule 0  . diclofenac (VOLTAREN) 75 MG EC tablet Take 75 mg by mouth 2 (two) times daily as needed.     Marland Kitchen FLUoxetine (PROZAC) 40 MG capsule Take 40 mg by mouth daily.    Marland Kitchen  gabapentin (NEURONTIN) 300 MG capsule Take 300 mg by mouth 3 (three) times daily as needed.     Marland Kitchen HYDROcodone-acetaminophen (NORCO/VICODIN) 5-325 MG tablet Take one-two tabs po q 4-6 hrs prn pain 15 tablet 0  . ibuprofen (ADVIL,MOTRIN) 600 MG tablet Take 600 mg by mouth every 6 (six) hours as needed. Reported on 11/26/2015    . IRON PO Take by mouth daily.    Marland Kitchen lamoTRIgine (LAMICTAL) 25 MG tablet Take 25 mg by mouth 2 (two) times daily.     . metoprolol tartrate (LOPRESSOR) 25 MG tablet Take 25 mg by mouth 2 (two) times daily.    . promethazine (PHENERGAN) 25 MG tablet Take 1 tablet (25 mg total) by mouth every 6 (six) hours as needed for nausea or vomiting. 16 tablet 0  . traMADol (ULTRAM) 50 MG tablet  Take 50 mg by mouth every 8 (eight) hours as needed for moderate pain or severe pain.     . traZODone (DESYREL) 150 MG tablet Take 300 mg by mouth at bedtime as needed.      No current facility-administered medications for this visit.    No Known Allergies  Review of Systems  Constitutional: Positive for fever and fatigue. Negative for chills.       Weight gain   HENT: Positive for dental problem and hearing loss.        Sensitive teeth  Eyes: Positive for visual disturbance.  Respiratory: Positive for cough, chest tightness, shortness of breath and wheezing.   Cardiovascular: Positive for chest pain. Negative for palpitations and leg swelling.  Gastrointestinal: Positive for nausea, vomiting and abdominal pain.       Frequent heartburn, reflux  Endocrine: Negative.   Genitourinary: Positive for frequency.  Skin: Negative.   Allergic/Immunologic: Negative.   Neurological: Positive for numbness.       Chronic pain, memory problems  Hematological: Negative.   Psychiatric/Behavioral: Positive for dysphoric mood. The patient is nervous/anxious.     BP 128/88 mmHg  Pulse 76  Resp 20  Ht 5\' 11"  (1.803 m)  Wt 192 lb (87.091 kg)  BMI 26.79 kg/m2  SpO2 96% Physical Exam  Constitutional: He is oriented to person, place, and time. He appears well-developed and well-nourished. No distress.  HENT:  Head: Normocephalic and atraumatic.  Mouth/Throat: Oropharynx is clear and moist.  Eyes: EOM are normal. Pupils are equal, round, and reactive to light.  Neck: Normal range of motion. Neck supple. No JVD present. No thyromegaly present.  Cardiovascular: Normal rate, regular rhythm, normal heart sounds and intact distal pulses.   No murmur heard. Pulmonary/Chest: Effort normal and breath sounds normal. No respiratory distress. He has no wheezes.  Abdominal: Soft. Bowel sounds are normal. He exhibits no distension and no mass. There is no tenderness.  Musculoskeletal: Normal range of  motion. He exhibits no edema.  Lymphadenopathy:    He has no cervical adenopathy.  Neurological: He is alert and oriented to person, place, and time. He has normal strength. No cranial nerve deficit or sensory deficit.  Skin: Skin is warm and dry.  Psychiatric: He has a normal mood and affect.     Diagnostic Tests:   CLINICAL DATA: Cough, especially at night. Right-sided chest pain with crampy feeling. Shortness of breath with dyspnea on exertion.  EXAM: CT CHEST WITHOUT CONTRAST  TECHNIQUE: Multidetector CT imaging of the chest was performed following the standard protocol without IV contrast.  COMPARISON: 01/23/2014.  FINDINGS: Mediastinum/Nodes: There are numerous mediastinal lymph nodes, none  of which are enlarged by CT size criteria, similar to the prior exam. Hilar regions are difficult to definitively evaluate without IV contrast. Axillary lymph nodes are sub cm short axis size. Heart size normal. No pericardial effusion.  Lungs/Pleura: There is a fairly diffuse pattern of peribronchovascular nodularity, with fissural involvement, stable. Nodules measure up to 1.4 cm in the left upper lobe (series 3, image 30), also unchanged. No pleural fluid. Airway is unremarkable.  Upper abdomen: Visualized portions of the liver, gallbladder, adrenal glands, kidneys, spleen, pancreas and stomach are grossly unremarkable with the exception of a tiny hiatal hernia. No upper abdominal adenopathy.  Musculoskeletal: No worrisome lytic or sclerotic lesions.  IMPRESSION: 1. Stable pattern of diffuse peribronchovascular nodularity with fissural involvement, most indicative of sarcoid. 2. Ground-glass left upper lobe nodule likely relates to #1. Adenocarcinoma cannot be definitively excluded. Additional follow-up could be obtained in 1 year, as clinically indicated.   Electronically Signed  By: Lorin Picket M.D.  On: 10/20/2015 14:30     Impression:  He  has diffuse nodular lung disease throughout the mid and lower lung zones predominantly with a dominant nodule in the lingula. He has chronic shortness of breath with exertion that can't be explained by his PFT's or CPX testing. I suspect that this is sarcoid but I agree that a VATS lung biopsy is probably the best way to make the diagnosis. I would plan to do a left VATS since the dominant nodule is in the lingula and near the surface so hopefully this can be visualized and removed. I discussed the procedure with him and his father including alternatives, benefits and risks including but not limited to bleeding, infection, prolonged air leak, and inability to make a firm diagnosis. He understands and agrees to proceed.  Plan:  Flexible bronchoscopy and left VATS for lung biopsy on Friday 01/23/2016.  Gaye Pollack, MD [facility] [phone]

## 2016-01-20 ENCOUNTER — Ambulatory Visit: Payer: Medicaid Other | Admitting: Pulmonary Disease

## 2016-01-21 ENCOUNTER — Encounter (HOSPITAL_COMMUNITY): Payer: Self-pay

## 2016-01-21 ENCOUNTER — Encounter (HOSPITAL_COMMUNITY)
Admission: RE | Admit: 2016-01-21 | Discharge: 2016-01-21 | Disposition: A | Discharge status: Home / Self Care | Payer: Medicaid Other | Source: Ambulatory Visit | Attending: Surgery | Admitting: Surgery

## 2016-01-21 VITALS — BP 118/83 | HR 71 | Temp 97.9°F | Resp 20 | Ht 71.0 in | Wt 190.7 lb

## 2016-01-21 DIAGNOSIS — Z0183 Encounter for blood typing: Secondary | ICD-10-CM | POA: Insufficient documentation

## 2016-01-21 DIAGNOSIS — R918 Other nonspecific abnormal finding of lung field: Secondary | ICD-10-CM | POA: Diagnosis not present

## 2016-01-21 DIAGNOSIS — Z01812 Encounter for preprocedural laboratory examination: Secondary | ICD-10-CM | POA: Insufficient documentation

## 2016-01-21 DIAGNOSIS — Z01818 Encounter for other preprocedural examination: Secondary | ICD-10-CM | POA: Insufficient documentation

## 2016-01-21 HISTORY — DX: Unspecified hearing loss, unspecified ear: H91.90

## 2016-01-21 HISTORY — DX: Family history of other specified conditions: Z84.89

## 2016-01-21 LAB — COMPREHENSIVE METABOLIC PANEL
ALK PHOS: 43 U/L (ref 38–126)
ALT: 18 U/L (ref 17–63)
AST: 22 U/L (ref 15–41)
Albumin: 3.8 g/dL (ref 3.5–5.0)
Anion gap: 12 (ref 5–15)
BILIRUBIN TOTAL: 0.5 mg/dL (ref 0.3–1.2)
BUN: 12 mg/dL (ref 6–20)
CALCIUM: 9.5 mg/dL (ref 8.9–10.3)
CO2: 21 mmol/L — ABNORMAL LOW (ref 22–32)
CREATININE: 0.89 mg/dL (ref 0.61–1.24)
Chloride: 106 mmol/L (ref 101–111)
GFR calc Af Amer: >60 mL/min (ref >60)
Glucose, Bld: 100 mg/dL — ABNORMAL HIGH (ref 65–99)
Potassium: 4.2 mmol/L (ref 3.5–5.1)
Sodium: 139 mmol/L (ref 135–145)
TOTAL PROTEIN: 7.4 g/dL (ref 6.5–8.1)

## 2016-01-21 LAB — URINALYSIS, ROUTINE W REFLEX MICROSCOPIC
BILIRUBIN URINE: NEGATIVE
Glucose, UA: NEGATIVE mg/dL
HGB URINE DIPSTICK: NEGATIVE
KETONES UR: NEGATIVE mg/dL
NITRITE: NEGATIVE
Protein, ur: NEGATIVE mg/dL
SPECIFIC GRAVITY, URINE: 1.009 (ref 1.005–1.030)
pH: 5.5 (ref 5.0–8.0)

## 2016-01-21 LAB — SURGICAL PCR SCREEN
MRSA, PCR: NEGATIVE
STAPHYLOCOCCUS AUREUS: NEGATIVE

## 2016-01-21 LAB — APTT: APTT: 30 s (ref 24–37)

## 2016-01-21 LAB — ABO/RH: ABO/RH(D): O POS

## 2016-01-21 LAB — CBC
HEMATOCRIT: 42.1 % (ref 39.0–52.0)
Hemoglobin: 14 g/dL (ref 13.0–17.0)
MCH: 31 pg (ref 26.0–34.0)
MCHC: 33.3 g/dL (ref 30.0–36.0)
MCV: 93.1 fL (ref 78.0–100.0)
Platelets: 214 10*3/uL (ref 150–400)
RBC: 4.52 MIL/uL (ref 4.22–5.81)
RDW: 12.3 % (ref 11.5–15.5)
WBC: 7 10*3/uL (ref 4.0–10.5)

## 2016-01-21 LAB — URINE MICROSCOPIC-ADD ON

## 2016-01-21 LAB — TYPE AND SCREEN
ABO/RH(D): O POS
ANTIBODY SCREEN: NEGATIVE

## 2016-01-21 LAB — PROTIME-INR
INR: 1.07 (ref 0.00–1.49)
Prothrombin Time: 14.1 seconds (ref 11.6–15.2)

## 2016-01-21 NOTE — Pre-Procedure Instructions (Signed)
Mareon Viviano  01/21/2016      [facility] - [place], [place] - [id number] [place] [address] Phone: [phone] Fax: [phone]  [place] [address] Phone: [phone] Fax: [phone]  [id number] [address], [place]. [place] [address] Phone: [phone] Fax: [phone]  [id number] - [place], [place] - [address] AT [address] [place] [address] Phone: [phone] Fax: [phone]    Your procedure is scheduled on Friday, March 17th   Report to [facility] Admitting at 5:30 AM            (Posted Surgery time 7:30 am - 11:30 am)   Call this number if you have problems the morning of surgery:  [phone]   Remember:  Do not eat food or drink liquids after midnight Thursday.   Take these medicines the morning of surgery with A SIP OF WATER : Dexilant, Prozac, Metoprolol.  Please use your inhaler that morning.             4-5 days prior to surgery, STOP taking any aspirin, vitamins, anti-inflammatories, herbal medications and supplements   Do not wear jewelry - no rings or watches.  Do not wear lotions - no lotions or colognes.    You may NOT wear deodorant the morning of surgery.              Men may shave face and neck.  Do not bring valuables to the hospital.  [facility] is not responsible for any belongings or valuables.  Contacts, dentures or bridgework may not be worn into surgery.  Leave your suitcase in the car.  After surgery it may be brought to your room.  For patients admitted to the hospital, discharge time will be determined by your treatment team.  Please read over the following fact sheets that you were given. Pain Booklet, Coughing and Deep Breathing, Blood Transfusion Information,  MRSA Information and Surgical Site Infection Prevention

## 2016-01-22 MED ORDER — DEXTROSE 5 % IV SOLN
1.5000 g | INTRAVENOUS | Status: AC
  Administered 2016-01-23: 1.5 g via INTRAVENOUS
  Filled 2016-01-22 (×2): qty 1.5

## 2016-01-22 NOTE — Anesthesia Preprocedure Evaluation (Addendum)
Anesthesia Evaluation  Patient identified by MRN, date of birth, ID band Patient awake    Reviewed: Allergy & Precautions, NPO status , Patient's Chart, lab work & pertinent test results, reviewed documented beta blocker date and time   Airway Mallampati: I  TM Distance: >3 FB Neck ROM: Full    Dental  (+) Teeth Intact   Pulmonary COPD,  COPD inhaler, former smoker,     + decreased breath sounds      Cardiovascular hypertension, Pt. on medications and Pt. on home beta blockers + Peripheral Vascular Disease  + dysrhythmias  Rhythm:Regular Rate:Normal     Neuro/Psych PSYCHIATRIC DISORDERS Bipolar Disorder Schizophrenia negative neurological ROS     GI/Hepatic Neg liver ROS, GERD  ,  Endo/Other  negative endocrine ROS  Renal/GU negative Renal ROS  negative genitourinary   Musculoskeletal  (+) Arthritis , Osteoarthritis,    Abdominal   Peds negative pediatric ROS (+)  Hematology   Anesthesia Other Findings   Reproductive/Obstetrics negative OB ROS                           Lab Results  Component Value Date   WBC 7.0 01/21/2016   HGB 14.0 01/21/2016   HCT 42.1 01/21/2016   MCV 93.1 01/21/2016   PLT 214 01/21/2016   Lab Results  Component Value Date   CREATININE 0.89 01/21/2016   BUN 12 01/21/2016   NA 139 01/21/2016   K 4.2 01/21/2016   CL 106 01/21/2016   CO2 21* 01/21/2016   Lab Results  Component Value Date   INR 1.07 01/21/2016   INR 1.07 11/05/2015    Anesthesia Physical Anesthesia Plan  ASA: III  Anesthesia Plan: General   Post-op Pain Management:    Induction: Intravenous  Airway Management Planned: Double Lumen EBT  Additional Equipment: Arterial line, CVP and Ultrasound Guidance Line Placement  Intra-op Plan:   Post-operative Plan: Extubation in OR  Informed Consent: I have reviewed the patients History and Physical, chart, labs and discussed the  procedure including the risks, benefits and alternatives for the proposed anesthesia with the patient or authorized representative who has indicated his/her understanding and acceptance.   Dental advisory given  Plan Discussed with: CRNA  Anesthesia Plan Comments:         Anesthesia Quick Evaluation

## 2016-01-23 ENCOUNTER — Inpatient Hospital Stay (HOSPITAL_COMMUNITY)
Admission: RE | Admit: 2016-01-23 | Discharge: 2016-01-27 | DRG: 168 | Disposition: A | Discharge status: Home / Self Care | Payer: Medicaid Other | Source: Ambulatory Visit | Attending: Surgery | Admitting: Surgery

## 2016-01-23 ENCOUNTER — Inpatient Hospital Stay (HOSPITAL_COMMUNITY): Payer: Medicaid Other | Admitting: Certified Registered Nurse Anesthetist

## 2016-01-23 ENCOUNTER — Encounter (HOSPITAL_COMMUNITY)
Admission: RE | Disposition: A | Discharge status: Home / Self Care | Payer: Self-pay | Source: Ambulatory Visit | Attending: Surgery

## 2016-01-23 ENCOUNTER — Inpatient Hospital Stay (HOSPITAL_COMMUNITY): Payer: Medicaid Other

## 2016-01-23 DIAGNOSIS — Z09 Encounter for follow-up examination after completed treatment for conditions other than malignant neoplasm: Secondary | ICD-10-CM

## 2016-01-23 DIAGNOSIS — E785 Hyperlipidemia, unspecified: Secondary | ICD-10-CM | POA: Diagnosis present

## 2016-01-23 DIAGNOSIS — K219 Gastro-esophageal reflux disease without esophagitis: Secondary | ICD-10-CM | POA: Diagnosis present

## 2016-01-23 DIAGNOSIS — Z4682 Encounter for fitting and adjustment of non-vascular catheter: Secondary | ICD-10-CM

## 2016-01-23 DIAGNOSIS — R001 Bradycardia, unspecified: Secondary | ICD-10-CM | POA: Diagnosis not present

## 2016-01-23 DIAGNOSIS — Z79899 Other long term (current) drug therapy: Secondary | ICD-10-CM | POA: Diagnosis not present

## 2016-01-23 DIAGNOSIS — J449 Chronic obstructive pulmonary disease, unspecified: Secondary | ICD-10-CM | POA: Diagnosis present

## 2016-01-23 DIAGNOSIS — R918 Other nonspecific abnormal finding of lung field: Secondary | ICD-10-CM | POA: Diagnosis present

## 2016-01-23 DIAGNOSIS — F17220 Nicotine dependence, chewing tobacco, uncomplicated: Secondary | ICD-10-CM | POA: Diagnosis present

## 2016-01-23 DIAGNOSIS — F250 Schizoaffective disorder, bipolar type: Secondary | ICD-10-CM | POA: Diagnosis present

## 2016-01-23 DIAGNOSIS — I10 Essential (primary) hypertension: Secondary | ICD-10-CM | POA: Diagnosis present

## 2016-01-23 DIAGNOSIS — R911 Solitary pulmonary nodule: Secondary | ICD-10-CM

## 2016-01-23 DIAGNOSIS — Z01818 Encounter for other preprocedural examination: Secondary | ICD-10-CM

## 2016-01-23 HISTORY — PX: FLEXIBLE BRONCHOSCOPY: SHX5094

## 2016-01-23 HISTORY — PX: LUNG BIOPSY: SHX5088

## 2016-01-23 HISTORY — PX: VIDEO ASSISTED THORACOSCOPY: SHX5073

## 2016-01-23 SURGERY — BRONCHOSCOPY, FLEXIBLE
Anesthesia: General | Site: Chest

## 2016-01-23 MED ORDER — PROPOFOL 10 MG/ML IV BOLUS
INTRAVENOUS | Status: DC | PRN
  Administered 2016-01-23 (×2): 50 mg via INTRAVENOUS
  Administered 2016-01-23: 40 mg via INTRAVENOUS
  Administered 2016-01-23: 50 mg via INTRAVENOUS
  Administered 2016-01-23: 150 mg via INTRAVENOUS

## 2016-01-23 MED ORDER — GLYCOPYRROLATE 0.2 MG/ML IJ SOLN
INTRAMUSCULAR | Status: DC | PRN
  Administered 2016-01-23: .8 mg via INTRAVENOUS

## 2016-01-23 MED ORDER — VECURONIUM BROMIDE 10 MG IV SOLR
INTRAVENOUS | Status: DC | PRN
  Administered 2016-01-23: 6 mg via INTRAVENOUS
  Administered 2016-01-23: 1 mg via INTRAVENOUS
  Administered 2016-01-23: 3 mg via INTRAVENOUS
  Administered 2016-01-23: 2 mg via INTRAVENOUS
  Administered 2016-01-23: 4 mg via INTRAVENOUS

## 2016-01-23 MED ORDER — TRAMADOL HCL 50 MG PO TABS: 50.0000 mg | ORAL_TABLET | Freq: Four times a day (QID) | ORAL | Status: DC | PRN

## 2016-01-23 MED ORDER — FENTANYL 40 MCG/ML IV SOLN
INTRAVENOUS | Status: DC
  Administered 2016-01-23: 60 ug via INTRAVENOUS
  Administered 2016-01-23: 11:00:00 via INTRAVENOUS
  Administered 2016-01-24: 2 ug via INTRAVENOUS
  Administered 2016-01-24: 120 ug via INTRAVENOUS
  Administered 2016-01-24: 75 ug via INTRAVENOUS
  Administered 2016-01-25: 60 ug via INTRAVENOUS
  Administered 2016-01-25: 11:00:00 via INTRAVENOUS
  Administered 2016-01-25: 30 ug via INTRAVENOUS
  Administered 2016-01-25: 45 ug via INTRAVENOUS
  Administered 2016-01-26: 60 ug via INTRAVENOUS
  Administered 2016-01-26: 102 ug via INTRAVENOUS
  Filled 2016-01-23: qty 25

## 2016-01-23 MED ORDER — GABAPENTIN 300 MG PO CAPS: 300.0000 mg | ORAL_CAPSULE | Freq: Three times a day (TID) | ORAL | Status: DC | PRN

## 2016-01-23 MED ORDER — ONDANSETRON HCL 4 MG/2ML IJ SOLN
4.0000 mg | Freq: Four times a day (QID) | INTRAMUSCULAR | Status: DC | PRN
  Filled 2016-01-23: qty 2

## 2016-01-23 MED ORDER — DEXTROSE-NACL 5-0.9 % IV SOLN
INTRAVENOUS | Status: DC
  Administered 2016-01-23 – 2016-01-24 (×2): 1000 mL via INTRAVENOUS

## 2016-01-23 MED ORDER — FENTANYL CITRATE (PF) 100 MCG/2ML IJ SOLN
INTRAMUSCULAR | Status: DC | PRN
  Administered 2016-01-23 (×3): 50 ug via INTRAVENOUS
  Administered 2016-01-23: 100 ug via INTRAVENOUS
  Administered 2016-01-23: 50 ug via INTRAVENOUS

## 2016-01-23 MED ORDER — LACTATED RINGERS IV SOLN
INTRAVENOUS | Status: DC | PRN
  Administered 2016-01-23: 07:00:00 via INTRAVENOUS

## 2016-01-23 MED ORDER — ACETAMINOPHEN 160 MG/5ML PO SOLN
1000.0000 mg | Freq: Four times a day (QID) | ORAL | Status: DC
  Filled 2016-01-23: qty 40

## 2016-01-23 MED ORDER — FENTANYL CITRATE (PF) 250 MCG/5ML IJ SOLN
INTRAMUSCULAR | Status: AC
  Filled 2016-01-23: qty 5

## 2016-01-23 MED ORDER — HYDROMORPHONE HCL 1 MG/ML IJ SOLN
INTRAMUSCULAR | Status: AC
  Administered 2016-01-23: 0.5 mg via INTRAVENOUS
  Filled 2016-01-23: qty 1

## 2016-01-23 MED ORDER — CEFUROXIME SODIUM 1.5 G IJ SOLR
1.5000 g | Freq: Two times a day (BID) | INTRAMUSCULAR | Status: AC
  Administered 2016-01-23 – 2016-01-24 (×2): 1.5 g via INTRAVENOUS
  Filled 2016-01-23 (×2): qty 1.5

## 2016-01-23 MED ORDER — LAMOTRIGINE 25 MG PO TABS
25.0000 mg | ORAL_TABLET | Freq: Two times a day (BID) | ORAL | Status: DC
  Administered 2016-01-23 – 2016-01-27 (×8): 25 mg via ORAL
  Filled 2016-01-23 (×11): qty 1

## 2016-01-23 MED ORDER — OXYCODONE HCL 5 MG PO TABS: 5.0000 mg | ORAL_TABLET | ORAL | Status: DC | PRN

## 2016-01-23 MED ORDER — ONDANSETRON HCL 4 MG/2ML IJ SOLN
INTRAMUSCULAR | Status: DC | PRN
  Administered 2016-01-23: 4 mg via INTRAVENOUS

## 2016-01-23 MED ORDER — METOPROLOL TARTRATE 25 MG PO TABS
25.0000 mg | ORAL_TABLET | Freq: Two times a day (BID) | ORAL | Status: DC
  Administered 2016-01-24: 25 mg via ORAL
  Filled 2016-01-23 (×2): qty 1

## 2016-01-23 MED ORDER — PROPOFOL 10 MG/ML IV BOLUS
INTRAVENOUS | Status: AC
  Filled 2016-01-23: qty 40

## 2016-01-23 MED ORDER — MEPERIDINE HCL 25 MG/ML IJ SOLN: 6.2500 mg | INTRAMUSCULAR | Status: DC | PRN

## 2016-01-23 MED ORDER — LACTATED RINGERS IV SOLN
INTRAVENOUS | Status: DC | PRN
  Administered 2016-01-23 (×2): via INTRAVENOUS

## 2016-01-23 MED ORDER — SODIUM CHLORIDE 0.9% FLUSH: 9.0000 mL | INTRAVENOUS | Status: DC | PRN

## 2016-01-23 MED ORDER — PHENYLEPHRINE HCL 10 MG/ML IJ SOLN
INTRAMUSCULAR | Status: DC | PRN
  Administered 2016-01-23 (×2): 80 ug via INTRAVENOUS

## 2016-01-23 MED ORDER — LACTATED RINGERS IV SOLN
INTRAVENOUS | Status: DC
  Administered 2016-01-23: 13:00:00 via INTRAVENOUS

## 2016-01-23 MED ORDER — PANTOPRAZOLE SODIUM 40 MG PO TBEC
40.0000 mg | DELAYED_RELEASE_TABLET | Freq: Every day | ORAL | Status: DC
Start: 2016-01-24 — End: 2016-01-27
  Administered 2016-01-24 – 2016-01-27 (×4): 40 mg via ORAL
  Filled 2016-01-23 (×4): qty 1

## 2016-01-23 MED ORDER — METOPROLOL TARTRATE 25 MG PO TABS
25.0000 mg | ORAL_TABLET | Freq: Once | ORAL | Status: AC
  Administered 2016-01-23: 25 mg via ORAL

## 2016-01-23 MED ORDER — ALBUTEROL SULFATE (2.5 MG/3ML) 0.083% IN NEBU
2.5000 mg | INHALATION_SOLUTION | Freq: Four times a day (QID) | RESPIRATORY_TRACT | Status: DC | PRN
  Administered 2016-01-24: 2.5 mg via RESPIRATORY_TRACT
  Filled 2016-01-23: qty 3

## 2016-01-23 MED ORDER — FLUOXETINE HCL 20 MG PO CAPS
40.0000 mg | ORAL_CAPSULE | Freq: Every day | ORAL | Status: DC
  Administered 2016-01-23 – 2016-01-27 (×5): 40 mg via ORAL
  Filled 2016-01-23 (×6): qty 2

## 2016-01-23 MED ORDER — MIDAZOLAM HCL 5 MG/5ML IJ SOLN
INTRAMUSCULAR | Status: DC | PRN
  Administered 2016-01-23 (×2): 1 mg via INTRAVENOUS

## 2016-01-23 MED ORDER — PHENYLEPHRINE HCL 10 MG/ML IJ SOLN
10.0000 mg | INTRAVENOUS | Status: DC | PRN
  Administered 2016-01-23: 25 ug/min via INTRAVENOUS

## 2016-01-23 MED ORDER — 0.9 % SODIUM CHLORIDE (POUR BTL) OPTIME
TOPICAL | Status: DC | PRN
  Administered 2016-01-23: 2000 mL

## 2016-01-23 MED ORDER — PROMETHAZINE HCL 25 MG/ML IJ SOLN: 6.2500 mg | INTRAMUSCULAR | Status: DC | PRN

## 2016-01-23 MED ORDER — METOPROLOL TARTRATE 12.5 MG HALF TABLET
ORAL_TABLET | ORAL | Status: AC
  Administered 2016-01-23: 25 mg via ORAL
  Filled 2016-01-23: qty 2

## 2016-01-23 MED ORDER — DIPHENHYDRAMINE HCL 12.5 MG/5ML PO ELIX
12.5000 mg | ORAL_SOLUTION | Freq: Four times a day (QID) | ORAL | Status: DC | PRN
  Filled 2016-01-23: qty 5

## 2016-01-23 MED ORDER — HYDROMORPHONE HCL 1 MG/ML IJ SOLN
0.2500 mg | INTRAMUSCULAR | Status: DC | PRN
  Administered 2016-01-23 (×2): 0.5 mg via INTRAVENOUS

## 2016-01-23 MED ORDER — EPHEDRINE SULFATE 50 MG/ML IJ SOLN
INTRAMUSCULAR | Status: DC | PRN
  Administered 2016-01-23: 5 mg via INTRAVENOUS
  Administered 2016-01-23 (×2): 10 mg via INTRAVENOUS

## 2016-01-23 MED ORDER — TRAZODONE HCL 150 MG PO TABS
300.0000 mg | ORAL_TABLET | Freq: Every evening | ORAL | Status: DC | PRN
  Administered 2016-01-25: 300 mg via ORAL
  Filled 2016-01-23: qty 2

## 2016-01-23 MED ORDER — NEOSTIGMINE METHYLSULFATE 10 MG/10ML IV SOLN
INTRAVENOUS | Status: DC | PRN
Start: 2016-01-23 — End: 2016-01-23
  Administered 2016-01-23: 5 mg via INTRAVENOUS

## 2016-01-23 MED ORDER — MIDAZOLAM HCL 2 MG/2ML IJ SOLN
INTRAMUSCULAR | Status: AC
  Filled 2016-01-23: qty 2

## 2016-01-23 MED ORDER — LIDOCAINE HCL (CARDIAC) 20 MG/ML IV SOLN
INTRAVENOUS | Status: DC | PRN
  Administered 2016-01-23: 40 mg via INTRAVENOUS

## 2016-01-23 MED ORDER — DIPHENHYDRAMINE HCL 50 MG/ML IJ SOLN
12.5000 mg | Freq: Four times a day (QID) | INTRAMUSCULAR | Status: DC | PRN
  Filled 2016-01-23: qty 0.25

## 2016-01-23 MED ORDER — NALOXONE HCL 0.4 MG/ML IJ SOLN
0.4000 mg | INTRAMUSCULAR | Status: DC | PRN
  Filled 2016-01-23: qty 1

## 2016-01-23 MED ORDER — FENTANYL CITRATE (PF) 250 MCG/5ML IJ SOLN
INTRAMUSCULAR | Status: AC
Start: 2016-01-23 — End: 2016-01-23
  Filled 2016-01-23: qty 5

## 2016-01-23 MED ORDER — ACETAMINOPHEN 500 MG PO TABS
1000.0000 mg | ORAL_TABLET | Freq: Four times a day (QID) | ORAL | Status: DC
  Administered 2016-01-23 – 2016-01-27 (×16): 1000 mg via ORAL
  Filled 2016-01-23 (×11): qty 2

## 2016-01-23 MED ORDER — FENTANYL 40 MCG/ML IV SOLN
INTRAVENOUS | Status: AC
  Filled 2016-01-23: qty 25

## 2016-01-23 SURGICAL SUPPLY — 85 items
ADH SKN CLS APL DERMABOND .7 (GAUZE/BANDAGES/DRESSINGS) ×2
BALL CTTN LRG ABS STRL LF (GAUZE/BANDAGES/DRESSINGS)
BRUSH CYTOL CELLEBRITY 1.5X140 (MISCELLANEOUS) IMPLANT
CANISTER SUCTION 2500CC (MISCELLANEOUS) ×8 IMPLANT
CATH KIT ON Q 5IN SLV (PAIN MANAGEMENT) IMPLANT
CATH THORACIC 28FR (CATHETERS) IMPLANT
CATH THORACIC 36FR (CATHETERS) IMPLANT
CATH THORACIC 36FR RT ANG (CATHETERS) IMPLANT
CONT SPEC 4OZ CLIKSEAL STRL BL (MISCELLANEOUS) ×14 IMPLANT
COTTONBALL LRG STERILE PKG (GAUZE/BANDAGES/DRESSINGS) IMPLANT
COVER SURGICAL LIGHT HANDLE (MISCELLANEOUS) ×10 IMPLANT
COVER TABLE BACK 60X90 (DRAPES) ×2 IMPLANT
DERMABOND ADVANCED (GAUZE/BANDAGES/DRESSINGS) ×2
DERMABOND ADVANCED .7 DNX12 (GAUZE/BANDAGES/DRESSINGS) IMPLANT
DRAPE CAMERA VIDEO/LASER (DRAPES) IMPLANT
DRAPE LAPAROSCOPIC ABDOMINAL (DRAPES) ×4 IMPLANT
DRAPE WARM FLUID 44X44 (DRAPE) ×2 IMPLANT
ELECT BLADE 4.0 EZ CLEAN MEGAD (MISCELLANEOUS) ×4
ELECT REM PT RETURN 9FT ADLT (ELECTROSURGICAL) ×4
ELECTRODE BLDE 4.0 EZ CLN MEGD (MISCELLANEOUS) IMPLANT
ELECTRODE REM PT RTRN 9FT ADLT (ELECTROSURGICAL) ×2 IMPLANT
FORCEPS BIOP RJ4 1.8 (CUTTING FORCEPS) IMPLANT
GAUZE SPONGE 4X4 12PLY STRL (GAUZE/BANDAGES/DRESSINGS) ×6 IMPLANT
GLOVE BIO SURGEON STRL SZ7.5 (GLOVE) ×2 IMPLANT
GLOVE BIOGEL PI IND STRL 6.5 (GLOVE) IMPLANT
GLOVE BIOGEL PI INDICATOR 6.5 (GLOVE) ×6
GLOVE EUDERMIC 7 POWDERFREE (GLOVE) ×10 IMPLANT
GLOVE SURG SS PI 7.0 STRL IVOR (GLOVE) ×4 IMPLANT
GOWN STRL REUS W/ TWL LRG LVL3 (GOWN DISPOSABLE) ×4 IMPLANT
GOWN STRL REUS W/ TWL XL LVL3 (GOWN DISPOSABLE) ×2 IMPLANT
GOWN STRL REUS W/TWL LRG LVL3 (GOWN DISPOSABLE) ×12
GOWN STRL REUS W/TWL XL LVL3 (GOWN DISPOSABLE) ×16
HANDLE STAPLE ENDO GIA SHORT (STAPLE) ×2
KIT BASIN OR (CUSTOM PROCEDURE TRAY) ×4 IMPLANT
KIT ROOM TURNOVER OR (KITS) ×8 IMPLANT
MARKER SKIN DUAL TIP RULER LAB (MISCELLANEOUS) ×2 IMPLANT
NDL BIOPSY TRANSBRONCH 21G (NEEDLE) IMPLANT
NEEDLE 22X1 1/2 (OR ONLY) (NEEDLE) IMPLANT
NEEDLE BIOPSY TRANSBRONCH 21G (NEEDLE) IMPLANT
NS IRRIG 1000ML POUR BTL (IV SOLUTION) ×10 IMPLANT
OIL SILICONE PENTAX (PARTS (SERVICE/REPAIRS)) ×4 IMPLANT
PACK CHEST (CUSTOM PROCEDURE TRAY) ×4 IMPLANT
PAD ARMBOARD 7.5X6 YLW CONV (MISCELLANEOUS) ×12 IMPLANT
RELOAD EGIA 45 MED/THCK PURPLE (STAPLE) ×4 IMPLANT
RELOAD EGIA 60 MED/THCK PURPLE (STAPLE) ×24 IMPLANT
RELOAD STAPLE 60 MED/THCK ART (STAPLE) IMPLANT
SEALANT SURG COSEAL 4ML (VASCULAR PRODUCTS) IMPLANT
SEALANT SURG COSEAL 8ML (VASCULAR PRODUCTS) IMPLANT
SOLUTION ANTI FOG 6CC (MISCELLANEOUS) IMPLANT
SPECIMEN JAR MEDIUM (MISCELLANEOUS) ×4 IMPLANT
SPONGE GAUZE 4X4 12PLY STER LF (GAUZE/BANDAGES/DRESSINGS) ×2 IMPLANT
STAPLER ENDO GIA 12 SHRT THIN (STAPLE) IMPLANT
STAPLER ENDO GIA 12MM SHORT (STAPLE) ×2 IMPLANT
SUT ETHILON 3 0 PS 1 (SUTURE) ×2 IMPLANT
SUT PROLENE 3 0 SH DA (SUTURE) IMPLANT
SUT PROLENE 4 0 RB 1 (SUTURE)
SUT PROLENE 4-0 RB1 .5 CRCL 36 (SUTURE) IMPLANT
SUT SILK  1 MH (SUTURE) ×4
SUT SILK 1 MH (SUTURE) IMPLANT
SUT SILK 2 0 SH (SUTURE) ×2 IMPLANT
SUT SILK 2 0SH CR/8 30 (SUTURE) IMPLANT
SUT VIC AB 1 CTX 36 (SUTURE)
SUT VIC AB 1 CTX36XBRD ANBCTR (SUTURE) IMPLANT
SUT VIC AB 2 TP1 27 (SUTURE) IMPLANT
SUT VIC AB 2-0 CT1 27 (SUTURE)
SUT VIC AB 2-0 CT1 TAPERPNT 27 (SUTURE) IMPLANT
SUT VIC AB 2-0 CTX 36 (SUTURE) IMPLANT
SUT VIC AB 2-0 UR6 27 (SUTURE) ×4 IMPLANT
SUT VIC AB 3-0 MH 27 (SUTURE) IMPLANT
SUT VIC AB 3-0 SH 27 (SUTURE)
SUT VIC AB 3-0 SH 27X BRD (SUTURE) IMPLANT
SUT VIC AB 3-0 X1 27 (SUTURE) ×2 IMPLANT
SYR 20ML ECCENTRIC (SYRINGE) ×4 IMPLANT
SYR 5ML LUER SLIP (SYRINGE) ×2 IMPLANT
SYR CONTROL 10ML LL (SYRINGE) IMPLANT
SYSTEM SAHARA CHEST DRAIN ATS (WOUND CARE) ×6 IMPLANT
TAPE CLOTH SURG 4X10 WHT LF (GAUZE/BANDAGES/DRESSINGS) ×2 IMPLANT
TIP APPLICATOR SPRAY EXTEND 16 (VASCULAR PRODUCTS) IMPLANT
TOWEL OR 17X24 6PK STRL BLUE (TOWEL DISPOSABLE) ×4 IMPLANT
TOWEL OR 17X26 10 PK STRL BLUE (TOWEL DISPOSABLE) ×2 IMPLANT
TRAP SPECIMEN MUCOUS 40CC (MISCELLANEOUS) ×2 IMPLANT
TRAY FOLEY CATH 14FRSI W/METER (CATHETERS) ×4 IMPLANT
TUBE CONNECTING 12'X1/4 (SUCTIONS)
TUBE CONNECTING 12X1/4 (SUCTIONS) ×2 IMPLANT
WATER STERILE IRR 1000ML POUR (IV SOLUTION) ×8 IMPLANT

## 2016-01-23 NOTE — H&P (Signed)
[address]       [place] [address]             [phone]      Cardiothoracic Surgery History and Physical   PCP is Soyla Dryer, PA-C Referring Provider is Javier Glazier, MD  Chief Complaint  Patient presents with  . Lung Lesion        HPI:  The patient is a 45 year old former smoker who still dips and was found to have lung nodules on a CT of the abdomen in 2014. A follow up CT scan of the chest in 01/2014 showed mid and lower lung zone predominant pattern of peribronchovascular and perifissural nodularity. There was a new 9 x 13 mm sub solid nodule in the anterior lingula that was new. His most recent follow up scan on 10/20/2015 was unchanged. He reports dyspnea with exertion for years such as walking up inclines. He has a nonproductive cough. He was put on Symbicort without improvement and has now stopped it. He has been evaluated by Dr. Ashok Cordia of pulmonary medicine. PFT's showed no evidence of airway obstruction.  Alpha-1 antitrypsin negative. CPX testing showed normal functional capacity with no clear ventilatory or circulatory limitations to exercise.  Past Medical History  Diagnosis Date  . Arthritis   . Back pain   . Bipolar 1 disorder ([place])   . GERD (gastroesophageal reflux disease)   . Diverticulitis   . Blood clots in brain   . Vomiting   . COPD (chronic obstructive pulmonary disease) ([place])   . Lung nodules   . Bipolar disorder ([place])   . Schizoaffective disorder, bipolar type ([place])   . Heart disease     "irregular heart beat"  . Anemia   . Hyperlipidemia   . Hypertension     Past Surgical History  Procedure Laterality Date  . Brain surgery  2005    after head injury, patient states had 2 large blood clots evacuated  . Colonoscopy with esophagogastroduodenoscopy (egd) N/A 12/05/2013    [id number] erosive relfux/HH/melanosis coli/colonic  diverticulosis. tubulovillous adenoma removed. next tcs 11/2016    Family History  Problem Relation Age of Onset  . Adopted: Yes  . Colon cancer Neg Hx     adopted at 22 months old, unsure about GI history of parents   . Alzheimer's disease Other   . Parkinson's disease Other   . Mental illness Other     Social History Social History  Substance Use Topics  . Smoking status: Former Smoker -- 0.75 packs/day for 25 years    Types: Cigars, Cigarettes    Quit date: 07/09/2014  . Smokeless tobacco: Current User    Types: Snuff     Comment: pt uses dip tobacco  . Alcohol Use: 0.0 oz/week    0 Standard drinks or equivalent per week     Comment: "once in awhile" average 1-2 times a week with 1-2 drinks per sitting    Current Outpatient Prescriptions  Medication Sig Dispense Refill  . albuterol (PROVENTIL HFA;VENTOLIN HFA) 108 (90 BASE) MCG/ACT inhaler Inhale 1-2 puffs into the lungs every 6 (six) hours as needed for wheezing or shortness of breath.    . cyclobenzaprine (FLEXERIL) 10 MG tablet Take 10 mg by mouth 3 (three) times daily as needed for muscle spasms.    Marland Kitchen dexlansoprazole (DEXILANT) 60 MG capsule Take 1 capsule (60 mg total) by mouth daily. 20 capsule 0  . diclofenac (VOLTAREN) 75  MG EC tablet Take 75 mg by mouth 2 (two) times daily as needed.     Marland Kitchen FLUoxetine (PROZAC) 40 MG capsule Take 40 mg by mouth daily.    Marland Kitchen gabapentin (NEURONTIN) 300 MG capsule Take 300 mg by mouth 3 (three) times daily as needed.     Marland Kitchen HYDROcodone-acetaminophen (NORCO/VICODIN) 5-325 MG tablet Take one-two tabs po q 4-6 hrs prn pain 15 tablet 0  . ibuprofen (ADVIL,MOTRIN) 600 MG tablet Take 600 mg by mouth every 6 (six) hours as needed. Reported on 11/26/2015    . IRON PO Take by mouth daily.    Marland Kitchen lamoTRIgine (LAMICTAL) 25 MG tablet Take 25 mg by mouth 2 (two) times daily.     .  metoprolol tartrate (LOPRESSOR) 25 MG tablet Take 25 mg by mouth 2 (two) times daily.    . promethazine (PHENERGAN) 25 MG tablet Take 1 tablet (25 mg total) by mouth every 6 (six) hours as needed for nausea or vomiting. 16 tablet 0  . traMADol (ULTRAM) 50 MG tablet Take 50 mg by mouth every 8 (eight) hours as needed for moderate pain or severe pain.     . traZODone (DESYREL) 150 MG tablet Take 300 mg by mouth at bedtime as needed.      No current facility-administered medications for this visit.    No Known Allergies  Review of Systems  Constitutional: Positive for fever and fatigue. Negative for chills.   Weight gain  HENT: Positive for dental problem and hearing loss.   Sensitive teeth  Eyes: Positive for visual disturbance.  Respiratory: Positive for cough, chest tightness, shortness of breath and wheezing.  Cardiovascular: Positive for chest pain. Negative for palpitations and leg swelling.  Gastrointestinal: Positive for nausea, vomiting and abdominal pain.   Frequent heartburn, reflux  Endocrine: Negative.  Genitourinary: Positive for frequency.  Skin: Negative.  Allergic/Immunologic: Negative.  Neurological: Positive for numbness.   Chronic pain, memory problems  Hematological: Negative.  Psychiatric/Behavioral: Positive for dysphoric mood. The patient is nervous/anxious.    BP 128/88 mmHg  Pulse 76  Resp 20  Ht 5\' 11"  (1.803 m)  Wt 192 lb (87.091 kg)  BMI 26.79 kg/m2  SpO2 96% Physical Exam  Constitutional: He is oriented to person, place, and time. He appears well-developed and well-nourished. No distress.  HENT:  Head: Normocephalic and atraumatic.  Mouth/Throat: Oropharynx is clear and moist.  Eyes: EOM are normal. Pupils are equal, round, and reactive to light.  Neck: Normal range of motion. Neck supple. No JVD present. No thyromegaly present.  Cardiovascular: Normal rate, regular rhythm, normal heart sounds  and intact distal pulses.  No murmur heard. Pulmonary/Chest: Effort normal and breath sounds normal. No respiratory distress. He has no wheezes.  Abdominal: Soft. Bowel sounds are normal. He exhibits no distension and no mass. There is no tenderness.  Musculoskeletal: Normal range of motion. He exhibits no edema.  Lymphadenopathy:   He has no cervical adenopathy.  Neurological: He is alert and oriented to person, place, and time. He has normal strength. No cranial nerve deficit or sensory deficit.  Skin: Skin is warm and dry.  Psychiatric: He has a normal mood and affect.     Diagnostic Tests:   CLINICAL DATA: Cough, especially at night. Right-sided chest pain with crampy feeling. Shortness of breath with dyspnea on exertion.  EXAM: CT CHEST WITHOUT CONTRAST  TECHNIQUE: Multidetector CT imaging of the chest was performed following the standard protocol without IV contrast.  COMPARISON: 01/23/2014.  FINDINGS:  Mediastinum/Nodes: There are numerous mediastinal lymph nodes, none of which are enlarged by CT size criteria, similar to the prior exam. Hilar regions are difficult to definitively evaluate without IV contrast. Axillary lymph nodes are sub cm short axis size. Heart size normal. No pericardial effusion.  Lungs/Pleura: There is a fairly diffuse pattern of peribronchovascular nodularity, with fissural involvement, stable. Nodules measure up to 1.4 cm in the left upper lobe (series 3, image 30), also unchanged. No pleural fluid. Airway is unremarkable.  Upper abdomen: Visualized portions of the liver, gallbladder, adrenal glands, kidneys, spleen, pancreas and stomach are grossly unremarkable with the exception of a tiny hiatal hernia. No upper abdominal adenopathy.  Musculoskeletal: No worrisome lytic or sclerotic lesions.  IMPRESSION: 1. Stable pattern of diffuse peribronchovascular nodularity with fissural involvement, most indicative of  sarcoid. 2. Ground-glass left upper lobe nodule likely relates to #1. Adenocarcinoma cannot be definitively excluded. Additional follow-up could be obtained in 1 year, as clinically indicated.   Electronically Signed  By: Lorin Picket M.D.  On: 10/20/2015 14:30     Impression:  He has diffuse nodular lung disease throughout the mid and lower lung zones predominantly with a dominant nodule in the lingula. He has chronic shortness of breath with exertion that can't be explained by his PFT's or CPX testing. I suspect that this is sarcoid but I agree that a VATS lung biopsy is probably the best way to make the diagnosis. I would plan to do a left VATS since the dominant nodule is in the lingula and near the surface so hopefully this can be visualized and removed. I discussed the procedure with him and his father including alternatives, benefits and risks including but not limited to bleeding, infection, prolonged air leak, and inability to make a firm diagnosis. He understands and agrees to proceed.  Plan:  Flexible bronchoscopy and left VATS for lung biopsy on Friday 01/23/2016.  Gaye Pollack, MD [facility] [phone]

## 2016-01-23 NOTE — Op Note (Signed)
CARDIOTHORACIC SURGERY OPERATIVE NOTE  01/23/2016 Shawn [name] [id number]  Surgeon:  Gaye Pollack, MD  First Assistant: Jadene Pierini, PA-C   Preoperative Diagnosis:  Bilateral diffuse nodular lung disease   Postoperative Diagnosis: Same  Procedure:  1. Flexible video bronchoscopy 2. Left video-assisted thoracoscopy 3. Wedge biopsy of left upper and lower lobes  Anesthesia:  General Endotracheal   Clinical History/Surgical Indication:  The patient is a 45 year old former smoker who still dips and was found to have lung nodules on a CT of the abdomen in 2014. A follow up CT scan of the chest in 01/2014 showed mid and lower lung zone predominant pattern of peribronchovascular and perifissural nodularity. There was a new 9 x 13 mm sub solid nodule in the anterior lingula that was new. His most recent follow up scan on 10/20/2015 was unchanged. He reports dyspnea with exertion for years such as walking up inclines. He has a nonproductive cough. He was put on Symbicort without improvement and has now stopped it. He has been evaluated by Dr. Ashok Cordia of pulmonary medicine. PFT's showed no evidence of airway obstruction.  Alpha-1 antitrypsin negative. CPX testing showed normal functional capacity with no clear ventilatory or circulatory limitations to exercise.  He has diffuse nodular lung disease throughout the mid and lower lung zones predominantly with a dominant nodule in the lingula. He has chronic shortness of breath with exertion that can't be explained by his PFT's or CPX testing. I suspect that this is sarcoid but I agree that a VATS lung biopsy is probably the best way to make the diagnosis. I would plan to do a left VATS since the dominant nodule is in the lingula and near the surface so hopefully this can be visualized and removed. I discussed the procedure with him and his father including alternatives, benefits and risks including but not limited to bleeding, infection, prolonged  air leak, and inability to make a firm diagnosis. He understands and agrees to proceed.   Preparation:  The patient was seen in the preoperative holding area and the correct patient, correct operation, correct operative sidewere confirmed with the patient after reviewing the medical record and CT scan. The consent was signed by me. Preoperative antibiotics were given.  The patient was taken back to the operating room and positioned supine on the operating room table. After being placed under general endotracheal anesthesia by the anesthesia team using a single lumen tube a foley catheter was placed.  A surgical time-out was taken and the correct patient and operative procedure were confirmed with the nursing and anesthesia staff.   Flexible Bronchoscopy:  The video bronchoscope was passed down the endotracheal tube. The distal trachea was normal. The carina was sharp. The left bronchial tree had normal segmental anatomy with no endobronchial lesions or extrinsic compression. The right bronchial tree had normal segmental anatomy with no endobronchial lesions or extrinsic compression.  Left video assisted thoracoscopy with lung biopsy:  The ETT was switched to a double lumen tube. The patient ws positioned in the right lateral decubitus position and the left chest prepped and draped in a sterile manner. A 1 cm incision was made in the mid-axillary line at the 8th intercostal space. The left lung was deflated. A 10 mm trocar was inserted into the pleural space and a 30 degree thoracoscope was inserted. The left lung surface had diffuse irregularity with greyish colored lesions on the surface. The lung was compressed manually to allow visualization. Two additional small incisions were  made for instruments in the anterior axillary line at the 4th ICS and the posterior axillary line at the 6th ICS. The largest lesion that was seen on CT in the upper lobe was identified on the surface of the lung and a  wedge resection was performed using staplers. The specimen was sent to pathology for frozen section and Dr. Lyndon Code called the OR and said there was definitely some abnormality that was not carcinoma and did not appear to be sarcoid but he would have to do more testing to characterize. Another wedge biopsy was taken from the left upper lobe and one from the left lower lobe using staplers. There was complete hemostasis and no visible air leak. A 5F chest tube was placed through the lateral incision and advanced to the apex under direct vision. The incisions were closed in layers with 2-0 vicryl to approximate the muscle and subcutaneous tissue and 3-0 vicryl subcuticular skin suture. Dermabond was applied.  Dry sterile dressings were placed over the incisions and around the chest tube which was connected to pleurevac suction. The patient was turned supine, extubated, then transported to the PACU in satisfactory and stable condition.

## 2016-01-23 NOTE — Anesthesia Postprocedure Evaluation (Signed)
Anesthesia Post Note  Patient: Shawn [name]  Procedure(s) Performed: Procedure(s) (LRB): FLEXIBLE BRONCHOSCOPY (N/A) LEFT VIDEO ASSISTED THORACOSCOPY (Left) LEFT LUNG BIOPSY (N/A)  Patient location during evaluation: PACU Anesthesia Type: General Level of consciousness: awake and alert Pain management: pain level controlled Vital Signs Assessment: post-procedure vital signs reviewed and stable Respiratory status: spontaneous breathing, nonlabored ventilation, respiratory function stable and patient connected to nasal cannula oxygen Cardiovascular status: blood pressure returned to baseline and stable Postop Assessment: no signs of nausea or vomiting Anesthetic complications: no    Last Vitals:  Filed Vitals:   01/23/16 1110 01/23/16 1115  BP:  113/80  Pulse:  71  Temp:    Resp: 20 17    Last Pain:  Filed Vitals:   01/23/16 1123  PainSc: [name]

## 2016-01-23 NOTE — Transfer of Care (Signed)
Immediate Anesthesia Transfer of Care Note  Patient: Shawn [name]  Procedure(s) Performed: Procedure(s): FLEXIBLE BRONCHOSCOPY (N/A) LEFT VIDEO ASSISTED THORACOSCOPY (Left) LEFT LUNG BIOPSY (N/A)  Patient Location: PACU  Anesthesia Type:General  Level of Consciousness: awake, alert , oriented and patient cooperative  Airway & Oxygen Therapy: Patient Spontanous Breathing and Patient connected to nasal cannula oxygen  Post-op Assessment: Report given to RN and Post -op Vital signs reviewed and stable  Post vital signs: Reviewed and stable  Last Vitals:  Filed Vitals:   01/23/16 0606  BP: 119/83  Pulse: 77  Temp: 36.6 C  Resp: 20    Complications: No apparent anesthesia complications

## 2016-01-23 NOTE — Interval H&P Note (Signed)
History and Physical Interval Note:  01/23/2016 5:40 AM  Shawn [name]  has presented today for surgery, with the diagnosis of LUNG NODULES  The various methods of treatment have been discussed with the patient and family. After consideration of risks, benefits and other options for treatment, the patient has consented to  Procedure(s): FLEXIBLE BRONCHOSCOPY (N/A) VIDEO ASSISTED THORACOSCOPY (Left) LUNG BIOPSY (N/A) as a surgical intervention .  The patient's history has been reviewed, patient examined, no change in status, stable for surgery.  I have reviewed the patient's chart and labs.  Questions were answered to the patient's satisfaction.     Gaye Pollack

## 2016-01-23 NOTE — Anesthesia Procedure Notes (Addendum)
Procedure Name: Intubation Date/Time: 01/23/2016 7:36 AM Performed by: Bethel Born Pre-anesthesia Checklist: Patient identified, Timeout performed, Emergency Drugs available, Suction available and Patient being monitored Patient Re-evaluated:Patient Re-evaluated prior to inductionOxygen Delivery Method: Circle system utilized Preoxygenation: Pre-oxygenation with 100% oxygen Intubation Type: IV induction Ventilation: Mask ventilation without difficulty Laryngoscope Size: Mac and 3 Grade View: Grade I Tube type: Oral Tube size: 8.5 mm Number of attempts: 1 Airway Equipment and Method: Stylet Placement Confirmation: ETT inserted through vocal cords under direct vision,  breath sounds checked- equal and bilateral and positive ETCO2 Secured at: 25 cm Tube secured with: Tape Dental Injury: Teeth and Oropharynx as per pre-operative assessment     Procedure Name: Intubation Date/Time: 01/23/2016 8:01 AM Performed by: Bethel Born Pre-anesthesia Checklist: Patient identified, Timeout performed, Emergency Drugs available, Suction available and Patient being monitored Patient Re-evaluated:Patient Re-evaluated prior to inductionOxygen Delivery Method: Circle system utilized Preoxygenation: Pre-oxygenation with 100% oxygen Intubation Type: IV induction Laryngoscope Size: Mac and 3 Grade View: Grade I Tube type: Oral Endobronchial tube: Left, Double lumen EBT, EBT position confirmed by fiberoptic bronchoscope and EBT position confirmed by auscultation and 39 Fr Number of attempts: 1 Airway Equipment and Method: Stylet and Fiberoptic brochoscope Placement Confirmation: ETT inserted through vocal cords under direct vision,  breath sounds checked- equal and bilateral and positive ETCO2 Tube secured with: Tape Dental Injury: Teeth and Oropharynx as per pre-operative assessment  Comments: 8.5 ETT exchanged for 32F L DLT.  Dr. Miron Marxen Robert at bedside.  DL x 1 with MAC 3.  Tube  placed without change in dentition or soft tissues.  VSS.  EBBS.  Tube placement verified by bronchoscope.    RIJ CVP Dual Lumen: [id number]: The patient was identified and consent obtained.  TO was performed, and full barrier precautions were used.  The skin was anesthetized with lidocaine.  Once the vein was located with the 22 ga. needle using ultrasound guidance , the wire was inserted into the vein.  The wire location was confirmed with ultrasound.  The tissue was dilated and the catheter was carefully inserted, then sutured in place. A dressing was applied. The patient tolerated the procedure well.   CE

## 2016-01-23 NOTE — Brief Op Note (Signed)
01/23/2016  10:06 AM  PATIENT:  Shawn [name]  45 y.o. male  PRE-OPERATIVE DIAGNOSIS:  LUNG NODULES  POST-OPERATIVE DIAGNOSIS:  LUNG NODULES  PROCEDURE:  Procedure(s): FLEXIBLE BRONCHOSCOPY (N/A) LEFT VIDEO ASSISTED THORACOSCOPY (Left) LEFT LUNG BIOPSY (N/A)  SURGEON:  Surgeon(s) and Role:    * Gaye Pollack, MD - Primary  PHYSICIAN ASSISTANT: Jadene Pierini, PA-C  ASSISTANTS: same   ANESTHESIA:   general  EBL:  Total I/O In: 1000 [I.V.:1000] Out: 260 [Urine:245; Blood:15]  BLOOD ADMINISTERED:none  DRAINS: one 58F Chest Tube(s) in the left pleural space   LOCAL MEDICATIONS USED:  NONE  SPECIMEN:  Source of Specimen:  2 biopsies of left upper lobe and one of left lower lobe  DISPOSITION OF SPECIMEN:  PATHOLOGY  COUNTS:  YES  TOURNIQUET:  * No tourniquets in log *  DICTATION: .Note written in EPIC  PLAN OF CARE: Admit to inpatient   PATIENT DISPOSITION:  PACU - hemodynamically stable.   Delay start of Pharmacological VTE agent (>24hrs) due to surgical blood loss or risk of bleeding: yes

## 2016-01-24 ENCOUNTER — Inpatient Hospital Stay (HOSPITAL_COMMUNITY): Payer: Medicaid Other

## 2016-01-24 ENCOUNTER — Encounter (HOSPITAL_COMMUNITY): Payer: Self-pay

## 2016-01-24 LAB — BLOOD GAS, ARTERIAL
Acid-Base Excess: 1.4 mmol/L (ref 0.0–2.0)
BICARBONATE: 25.4 meq/L — AB (ref 20.0–24.0)
DRAWN BY: 22563
O2 CONTENT: 3 L/min
O2 SAT: 96 %
PH ART: 7.422 (ref 7.350–7.450)
PO2 ART: 80.5 mmHg (ref 80.0–100.0)
Patient temperature: 98.6
TCO2: 26.6 mmol/L (ref 0–100)
pCO2 arterial: 39.7 mmHg (ref 35.0–45.0)

## 2016-01-24 LAB — CBC
HCT: 39.2 % (ref 39.0–52.0)
Hemoglobin: 13 g/dL (ref 13.0–17.0)
MCH: 31.3 pg (ref 26.0–34.0)
MCHC: 33.2 g/dL (ref 30.0–36.0)
MCV: 94.5 fL (ref 78.0–100.0)
PLATELETS: 210 10*3/uL (ref 150–400)
RBC: 4.15 MIL/uL — AB (ref 4.22–5.81)
RDW: 12.6 % (ref 11.5–15.5)
WBC: 8.7 10*3/uL (ref 4.0–10.5)

## 2016-01-24 LAB — BASIC METABOLIC PANEL
Anion gap: 11 (ref 5–15)
BUN: 7 mg/dL (ref 6–20)
CHLORIDE: 103 mmol/L (ref 101–111)
CO2: 25 mmol/L (ref 22–32)
CREATININE: 1.02 mg/dL (ref 0.61–1.24)
Calcium: 8.9 mg/dL (ref 8.9–10.3)
GFR calc Af Amer: >60 mL/min (ref >60)
GFR calc non Af Amer: >60 mL/min (ref >60)
GLUCOSE: 116 mg/dL — AB (ref 65–99)
POTASSIUM: 3.8 mmol/L (ref 3.5–5.1)
Sodium: 139 mmol/L (ref 135–145)

## 2016-01-24 MED ORDER — ONDANSETRON HCL 4 MG/2ML IJ SOLN: 4.0000 mg | Freq: Four times a day (QID) | INTRAMUSCULAR | Status: DC | PRN

## 2016-01-24 MED ORDER — LUNG SURGERY BOOK
Freq: Once | Status: AC
  Administered 2016-01-24: 05:00:00
  Filled 2016-01-24: qty 1

## 2016-01-24 MED ORDER — BISACODYL 5 MG PO TBEC
10.0000 mg | DELAYED_RELEASE_TABLET | Freq: Every day | ORAL | Status: DC
Start: 2016-01-24 — End: 2016-01-27
  Administered 2016-01-24 – 2016-01-26 (×3): 10 mg via ORAL
  Filled 2016-01-24 (×4): qty 2

## 2016-01-24 MED ORDER — CETYLPYRIDINIUM CHLORIDE 0.05 % MT LIQD
7.0000 mL | Freq: Two times a day (BID) | OROMUCOSAL | Status: DC
  Administered 2016-01-24 – 2016-01-27 (×6): 7 mL via OROMUCOSAL

## 2016-01-24 MED ORDER — METOPROLOL TARTRATE 12.5 MG HALF TABLET
12.5000 mg | ORAL_TABLET | Freq: Two times a day (BID) | ORAL | Status: DC
  Administered 2016-01-24 – 2016-01-27 (×5): 12.5 mg via ORAL
  Filled 2016-01-24 (×6): qty 1

## 2016-01-24 MED ORDER — POTASSIUM CHLORIDE 10 MEQ/50ML IV SOLN: 10.0000 meq | Freq: Every day | INTRAVENOUS | Status: DC | PRN

## 2016-01-24 MED ORDER — METOPROLOL TARTRATE 12.5 MG HALF TABLET: 12.5000 mg | ORAL_TABLET | Freq: Two times a day (BID) | ORAL | Status: DC

## 2016-01-24 MED ORDER — SENNOSIDES-DOCUSATE SODIUM 8.6-50 MG PO TABS
1.0000 | ORAL_TABLET | Freq: Every day | ORAL | Status: DC
  Administered 2016-01-24 – 2016-01-25 (×2): 1 via ORAL
  Filled 2016-01-24: qty 1

## 2016-01-24 NOTE — Progress Notes (Addendum)
      [address]       [place] [address]             [phone]      1 Day Post-Op Procedure(s) (LRB): FLEXIBLE BRONCHOSCOPY (N/A) LEFT VIDEO ASSISTED THORACOSCOPY (Left) LEFT LUNG BIOPSY (N/A)   Subjective:  Some mild pain, worse with cough.  Denies N/V tolerating diet.  Objective: Vital signs in last 24 hours: Temp:  [97.3 F (36.3 C)-99.5 F (37.5 C)] 98 F (36.7 C) (03/18 0343) Pulse Rate:  [46-85] 58 (03/18 0726) Cardiac Rhythm:  [-] Sinus bradycardia (03/18 0824) Resp:  [12-31] 18 (03/18 0729) BP: (100-136)/(66-94) 110/80 mmHg (03/18 0726) SpO2:  [88 %-100 %] 94 % (03/18 0729) Arterial Line BP: (88-148)/(55-132) 145/132 mmHg (03/18 0726) Weight:  [189 lb 9.5 oz (86 kg)] 189 lb 9.5 oz (86 kg) (03/17 2054)  Intake/Output from previous day: 03/17 0701 - 03/18 0700 In: 4813.3 [P.O.:1000; I.V.:3463.3; IV Piggyback:50] Out: 2395 [Urine:2170; Blood:15; Chest Tube:210] Intake/Output this shift: Total I/O In: 830 [P.O.:480; I.V.:300; IV Piggyback:50] Out: 350 [Urine:350]  General appearance: alert, cooperative and no distress Heart: regular rate and rhythm Lungs: clear to auscultation bilaterally Abdomen: soft, non-tender; bowel sounds normal; no masses,  no organomegaly Wound: clean and dry  Lab Results:  Recent Labs  01/21/16 1229 01/24/16 0500  WBC 7.0 8.7  HGB 14.0 13.0  HCT 42.1 39.2  PLT 214 210   BMET:  Recent Labs  01/21/16 1229 01/24/16 0500  NA 139 139  K 4.2 3.8  CL 106 103  CO2 21* 25  GLUCOSE 100* 116*  BUN 12 7  CREATININE 0.89 1.02  CALCIUM 9.5 8.9    PT/INR:  Recent Labs  01/21/16 1229  LABPROT 14.1  INR 1.07   ABG    Component Value Date/Time   PHART 7.422 01/24/2016 0425   HCO3 25.4* 01/24/2016 0425   TCO2 26.6 01/24/2016 0425   ACIDBASEDEF 1.4 01/21/2016 1243   O2SAT 96.0 01/24/2016 0425   CBG (last 3)  No results for input(s): GLUCAP in the last 72 hours.  Assessment/Plan: S/P Procedure(s)  (LRB): FLEXIBLE BRONCHOSCOPY (N/A) LEFT VIDEO ASSISTED THORACOSCOPY (Left) LEFT LUNG BIOPSY (N/A)  1. Chest tube- no air leak appreciated, 210 cc output since surgery- will place to water seal today 2. Pulm- no acute issues, CXR shows no pneumothorax, continue IS 3. D/C Arterial line 4. Place IV fluid to [facility] as patient tolerating diet 5. CV- Bradycardia, at times in the 40s- will decrease his Lopressor dose to 12.5 mg BID 6. Dispo- patient stable, chest tube to water seal, repeat CXR in AM   LOS: 1 day    BARRETT, ERIN 01/24/2016  I have seen and examined the patient and agree with the assessment and plan as outlined.  Rexene Alberts, MD 01/24/2016 6:59 PM

## 2016-01-25 ENCOUNTER — Inpatient Hospital Stay (HOSPITAL_COMMUNITY): Payer: Medicaid Other

## 2016-01-25 LAB — COMPREHENSIVE METABOLIC PANEL
ALT: 15 U/L — ABNORMAL LOW (ref 17–63)
ANION GAP: 8 (ref 5–15)
AST: 20 U/L (ref 15–41)
Albumin: 3.3 g/dL — ABNORMAL LOW (ref 3.5–5.0)
Alkaline Phosphatase: 43 U/L (ref 38–126)
BILIRUBIN TOTAL: 0.4 mg/dL (ref 0.3–1.2)
BUN: 8 mg/dL (ref 6–20)
CO2: 26 mmol/L (ref 22–32)
Calcium: 9.2 mg/dL (ref 8.9–10.3)
Chloride: 104 mmol/L (ref 101–111)
Creatinine, Ser: 0.93 mg/dL (ref 0.61–1.24)
Glucose, Bld: 108 mg/dL — ABNORMAL HIGH (ref 65–99)
POTASSIUM: 4.2 mmol/L (ref 3.5–5.1)
Sodium: 138 mmol/L (ref 135–145)
TOTAL PROTEIN: 6.8 g/dL (ref 6.5–8.1)

## 2016-01-25 LAB — CBC
HEMATOCRIT: 41.1 % (ref 39.0–52.0)
Hemoglobin: 13.5 g/dL (ref 13.0–17.0)
MCH: 31.2 pg (ref 26.0–34.0)
MCHC: 32.8 g/dL (ref 30.0–36.0)
MCV: 94.9 fL (ref 78.0–100.0)
PLATELETS: 206 10*3/uL (ref 150–400)
RBC: 4.33 MIL/uL (ref 4.22–5.81)
RDW: 12.4 % (ref 11.5–15.5)
WBC: 9.3 10*3/uL (ref 4.0–10.5)

## 2016-01-25 NOTE — Progress Notes (Signed)
RT note: Instructed patient on use of flutter valve. Patient had good effort times 12 with strong non productive cough. Instructed patient to do every four hours while awake.

## 2016-01-25 NOTE — Progress Notes (Addendum)
      [address]       [place] [address]             [phone]      2 Days Post-Op Procedure(s) (LRB): FLEXIBLE BRONCHOSCOPY (N/A) LEFT VIDEO ASSISTED THORACOSCOPY (Left) LEFT LUNG BIOPSY (N/A)   Subjective:  Mr. Marinez has no new complaints.  He continues to have some incisional pain.    Objective: Vital signs in last 24 hours: Temp:  [98 F (36.7 C)-98.9 F (37.2 C)] 98.1 F (36.7 C) (03/19 0700) Pulse Rate:  [61-70] 64 (03/19 0700) Cardiac Rhythm:  [-] Sinus bradycardia (03/19 0803) Resp:  [12-23] 20 (03/19 0803) BP: (103-125)/(65-86) 110/76 mmHg (03/19 0700) SpO2:  [92 %-97 %] 95 % (03/19 0803)  Intake/Output from previous day: 03/18 0701 - 03/19 0700 In: 1701 [P.O.:960; I.V.:691; IV Piggyback:50] Out: 2910 [Urine:2900; Chest Tube:10]  General appearance: alert, cooperative and no distress Heart: regular rate and rhythm Lungs: clear to auscultation bilaterally Abdomen: soft, non-tender; bowel sounds normal; no masses,  no organomegaly Wound: clean, some blood tinged drainage on dressing  Lab Results:  Recent Labs  01/24/16 0500 01/25/16 0555  WBC 8.7 9.3  HGB 13.0 13.5  HCT 39.2 41.1  PLT 210 206   BMET:  Recent Labs  01/24/16 0500 01/25/16 0555  NA 139 138  K 3.8 4.2  CL 103 104  CO2 25 26  GLUCOSE 116* 108*  BUN 7 8  CREATININE 1.02 0.93  CALCIUM 8.9 9.2    PT/INR: No results for input(s): LABPROT, INR in the last 72 hours. ABG    Component Value Date/Time   PHART 7.422 01/24/2016 0425   HCO3 25.4* 01/24/2016 0425   TCO2 26.6 01/24/2016 0425   ACIDBASEDEF 1.4 01/21/2016 1243   O2SAT 96.0 01/24/2016 0425   CBG (last 3)  No results for input(s): GLUCAP in the last 72 hours.  Assessment/Plan: S/P Procedure(s) (LRB): FLEXIBLE BRONCHOSCOPY (N/A) LEFT VIDEO ASSISTED THORACOSCOPY (Left) LEFT LUNG BIOPSY (N/A)  1. Chest tube- tidaling vs 1+ air leak with cough, minimal chest tube output- will leave in place  today 2. Pulm- + atelectasis, no pneumothorax on CXR, will add flutter valve, weaning oxygen as tolerated 3. CV- Bradycardia improved some running 50-70, continue Lopressor at reduced dose 4. Dispo- patient stable, continue chest tube to water seal today, repeat CXR in AM   LOS: 2 days    BARRETT, ERIN 01/25/2016  I have seen and examined the patient and agree with the assessment and plan as outlined.  Rexene Alberts, MD 01/25/2016 1:14 PM

## 2016-01-26 ENCOUNTER — Inpatient Hospital Stay (HOSPITAL_COMMUNITY): Payer: Medicaid Other

## 2016-01-26 ENCOUNTER — Encounter (HOSPITAL_COMMUNITY): Payer: Self-pay | Admitting: Surgery

## 2016-01-26 NOTE — Progress Notes (Addendum)
[address]       [place] [address]             [phone]      3 Days Post-Op Procedure(s) (LRB): FLEXIBLE BRONCHOSCOPY (N/A) LEFT VIDEO ASSISTED THORACOSCOPY (Left) LEFT LUNG BIOPSY (N/A) Subjective: Sore with movement  Objective: Vital signs in last 24 hours: Temp:  [97.7 F (36.5 C)-98.5 F (36.9 C)] 97.7 F (36.5 C) (03/20 0732) Pulse Rate:  [56-70] 56 (03/19 2320) Cardiac Rhythm:  [-] Normal sinus rhythm (03/19 2021) Resp:  [14-23] 17 (03/20 0417) BP: (108-119)/(76-86) 108/78 mmHg (03/19 2320) SpO2:  [91 %-98 %] 92 % (03/20 0417)  Hemodynamic parameters for last 24 hours:    Intake/Output from previous day: 03/19 0701 - 03/20 0700 In: 1940 [P.O.:1680; I.V.:260] Out: 4270 [Urine:4150; Chest Tube:120] Intake/Output this shift:    General appearance: alert, cooperative and no distress Heart: regular rate and rhythm Lungs: dim in lower fields Abdomen: benign Extremities: PAS in place Wound: incis healing well  Lab Results:  Recent Labs  01/24/16 0500 01/25/16 0555  WBC 8.7 9.3  HGB 13.0 13.5  HCT 39.2 41.1  PLT 210 206   BMET:  Recent Labs  01/24/16 0500 01/25/16 0555  NA 139 138  K 3.8 4.2  CL 103 104  CO2 25 26  GLUCOSE 116* 108*  BUN 7 8  CREATININE 1.02 0.93  CALCIUM 8.9 9.2    PT/INR: No results for input(s): LABPROT, INR in the last 72 hours. ABG    Component Value Date/Time   PHART 7.422 01/24/2016 0425   HCO3 25.4* 01/24/2016 0425   TCO2 26.6 01/24/2016 0425   ACIDBASEDEF 1.4 01/21/2016 1243   O2SAT 96.0 01/24/2016 0425   CBG (last 3)  No results for input(s): GLUCAP in the last 72 hours.  Meds Scheduled Meds: . acetaminophen  1,000 mg Oral 4 times per day   Or  . acetaminophen (TYLENOL) oral liquid 160 mg/5 mL  1,000 mg Oral 4 times per day  . antiseptic oral rinse  7 mL Mouth Rinse BID  . bisacodyl  10 mg Oral Daily  . fentaNYL   Intravenous 6 times per day  . FLUoxetine  40 mg Oral Daily    . lamoTRIgine  25 mg Oral BID  . metoprolol tartrate  12.5 mg Oral BID  . pantoprazole  40 mg Oral Daily  . senna-docusate  1 tablet Oral QHS   Continuous Infusions: . dextrose 5 % and 0.9% NaCl 10 mL/hr at 01/26/16 0600   PRN Meds:.albuterol, diphenhydrAMINE **OR** diphenhydrAMINE, gabapentin, naloxone **AND** sodium chloride flush, ondansetron (ZOFRAN) IV, oxyCODONE, potassium chloride, traMADol, traZODone  Xrays Dg Chest Port 1 View  01/25/2016  CLINICAL DATA:  Status post lung surgery EXAM: PORTABLE CHEST 1 VIEW COMPARISON:  Chest radiograph from one day prior. FINDINGS: Right internal jugular central venous catheter terminates in the lower third of the superior vena cava. Left chest tube is stable in position. Stable subcutaneous emphysema in the lateral lower left chest wall. Stable cardiomediastinal silhouette with normal heart size. No pneumothorax. No pleural effusion. Moderate bibasilar patchy lung opacities, favor atelectasis, unchanged. No pulmonary edema. IMPRESSION: 1. No pneumothorax. 2. Stable moderate bibasilar patchy lung opacities, favor atelectasis. Electronically Signed   By: Ilona Sorrel M.D.   On: 01/25/2016 08:12    Assessment/Plan: S/P Procedure(s) (LRB): FLEXIBLE BRONCHOSCOPY (N/A) LEFT VIDEO ASSISTED THORACOSCOPY (Left) LEFT LUNG BIOPSY (N/A)  1 doing well 2 no air leak or  pneumothorax -d/c CT 3 cont pulm toilet/rehab 4 d/c pca when CT out 5 d/c IJ  LOS: 3 days    GOLD,WAYNE E 01/26/2016   Chart reviewed, patient examined, agree with above. CXR ok with tube out. He has some chest wall pain but walking well. Path is pending and may take a while. Plan home tomorrow.

## 2016-01-26 NOTE — Progress Notes (Signed)
72ml of Fentanyl PCA wasted in trash.  Witnessed by Merlene Laughter, RN.

## 2016-01-26 NOTE — Progress Notes (Signed)
Left chest tube removed.  Area dressed with sterile gauze and taped in place.  Patient tolerated well.  Sitting up afterward to eat breakfast.

## 2016-01-26 NOTE — Progress Notes (Signed)
Patient walked 600 feet on room air with sats 93-94% during majority of ambulation.  Patient tolerated well.  Patient's mother in room visiting upon our completion of walking.

## 2016-01-26 NOTE — Progress Notes (Signed)
Utilization review completed. Alphons Burgert, RN, BSN. 

## 2016-01-26 NOTE — Discharge Instructions (Signed)
°  Refer to this sheet in the next few weeks. These instructions provide you with information on caring for yourself after your procedure. Your caregiver may also give you more specific instructions. Your procedure has been planned according to current medical practices, but problems sometimes occur. Call your caregiver if you have any problems or questions after your procedure. HOME CARE INSTRUCTIONS   Only take over-the-counter or prescription medications as directed.  Only take pain medications (narcotics) as directed.  Do not drive until your caregiver approves. Driving while taking narcotics or soon after surgery can be dangerous, so discuss the specific timing with your caregiver.  Avoid activities that use your chest muscles, such as lifting heavy objects, for at least 3-4 weeks.   Take deep breaths to expand the lungs and to protect against pneumonia.  Do breathing exercises as directed by your caregiver. If you were given an incentive spirometer to help with breathing, use it as directed.  You may resume a normal diet and activities when you feel you are able to or as directed.  Do not take a bath until your caregiver says it is OK. Use the shower instead.   Keep the bandage (dressing) covering the area where the chest tube was inserted (incision site) dry for 48 hours. After 48 hours, remove the dressing unless there is new drainage.  Remove dressings as directed by your caregiver.  Change dressings if necessary or as directed.  Keep all follow-up appointments. It is important for you to see your caregiver after surgery to discuss appropriate follow-up care and surveillance, if it is necessary. SEEK MEDICAL CARE:  You feel excessive or increasing pain at an incision site.  You notice bleeding, skin irritation, drainage, swelling, or redness at an incision site.  There is a bad smell coming from an incision or dressing.  It feels like your heart is fluttering or beating  rapidly.  Your pain medication does not relieve your pain. SEEK IMMEDIATE MEDICAL CARE IF:   You have a fever.   You have chest pain.  You have a rash.  You have shortness of breath.  You have trouble breathing.   You feel weak, lightheaded, dizzy, or faint.  MAKE SURE YOU:   Understand these instructions.   Will watch your condition.   Will get help right away if you are not doing well or get worse.   This information is not intended to replace advice given to you by your health care provider. Make sure you discuss any questions you have with your health care provider.   Document Released: 02/19/2013 Document Revised: 11/15/2014 Document Reviewed: 02/19/2013 Elsevier Interactive Patient Education [employer].

## 2016-01-26 NOTE — Progress Notes (Signed)
Patient received 105 mcg of Fentanyl before PCA discontinued at 0936.  Right IJ central line discontinued.  Pressure held 5 minutes.  Site dressed with vaseline gauze covered by dry gauze and taped in place.  Patient instructed to lie flat in bed for 30 minutes.  Thirty minute time period up at 10:06 a.m. Patient instructed on asking for pain medication when his pain level is a "3".   Patient verbalized his understanding.

## 2016-01-26 NOTE — Discharge Summary (Signed)
Physician Discharge Summary       [address]       [place] [address]             [phone]    Patient ID: Shawn [name] MRN: [id number] DOB/AGE: 07-18-1971 45 y.o.  Admit date: 01/23/2016 Discharge date: 01/27/2016  Admission Diagnosis: Bilateral,diffuse, nodular lung disease  Active Diagnoses:  1. COPD (chronic obstructive pulmonary disease) ([place]) 2. Bipolar 1 disorder ([place]) 3. GERD (gastroesophageal reflux disease) 4. Blood clots in brain 5. Arthritis 6. Back pain 7. Hyperlipidemia 8. Hypertension 9. Anemia 10. History of tobacco abuse  Procedure (s):  1. Flexible video bronchoscopy 2. Left video-assisted thoracoscopy 3. Wedge biopsy of left upper and lower lobes by Dr. Cyndia Bent on 01/23/2016.   History of Presenting Illness: The patient is a 45 year old former smoker who still dips and was found to have lung nodules on a CT of the abdomen in 2014. A follow up CT scan of the chest in 01/2014 showed mid and lower lung zone predominant pattern of peribronchovascular and perifissural nodularity. There was a new 9 x 13 mm sub solid nodule in the anterior lingula that was new. His most recent follow up scan on 10/20/2015 was unchanged. He reports dyspnea with exertion for years such as walking up inclines. He has a nonproductive cough. He was put on Symbicort without improvement and has now stopped it. He has been evaluated by Dr. Ashok Cordia of pulmonary medicine. PFT's showed no evidence of airway obstruction.  Alpha-1 antitrypsin negative. CPX testing showed normal functional capacity with no clear ventilatory or circulatory limitations to exercise He has diffuse nodular lung disease throughout the mid and lower lung zones predominantly with a dominant nodule in the lingula. He has chronic shortness of breath with exertion that can't be explained by his PFT's or CPX testing. Dr. Cyndia Bent suspected that this is sarcoid but agreed that a VATS lung  biopsy is probably the best way to make the diagnosis. I would plan to do a left VATS. Patient was admitted on 01/23/2016 in order to undergo a flexible bronchoscopy, left VATS, wedge of right upper and lower lobes.  Brief Hospital Course:  He remained afebrile and hemodynamically stable. A line and foley were removed early in his post operative course. He was initially bradycardic (into the 40's) so Lopressor was decreased. Chest tube output gradually decreased. There was a small air leak with cough from the chest tube. Daily chest x rays were obtained and remained stable. The air leak did resolve and the chest tube was removed on 01/26/2016. Follow up chest x ray showed no pneumothorax. He is ambulating on room air. He is tolerating a diet. His wound is continuing to heal. He is felt surgically stable for discharge today.  Latest Vital Signs: Blood pressure 108/77, pulse 63, temperature 97.7 F (36.5 C), temperature source Oral, resp. rate 17, height 5\' 11"  (1.803 m), weight 189 lb 9.5 oz (86 kg), SpO2 95 %.  Physical Exam: General appearance: alert, cooperative and no distress Heart: regular rate and rhythm Lungs: dim in lower fields Abdomen: benign Extremities: PAS in place Wound: incis healing well  Discharge Condition: Stable and discharged to home  Recent laboratory studies:  Lab Results  Component Value Date   WBC 9.3 01/25/2016   HGB 13.5 01/25/2016   HCT 41.1 01/25/2016   MCV 94.9 01/25/2016   PLT 206 01/25/2016   Lab Results  Component Value Date   NA 138 01/25/2016  K 4.2 01/25/2016   CL 104 01/25/2016   CO2 26 01/25/2016   CREATININE 0.93 01/25/2016   GLUCOSE 108* 01/25/2016    Diagnostic Studies: Dg Chest 1 View  01/26/2016  CLINICAL DATA:  Chest tube removal EXAM: CHEST 1 VIEW COMPARISON:  01/25/2016 FINDINGS: Left chest tube has been removed. Tiny less than 5% left apical pneumothorax has developed. Bibasilar pulmonary opacities are stable. Right jugular venous  catheter is stable. Emphysema over the lateral and lower left chest wall is stable. IMPRESSION: Left chest tube removed.  Tiny ensuing left apical pneumothorax Bibasilar pulmonary opacities are stable. Electronically Signed   By: Marybelle Killings M.D.   On: 01/26/2016 09:09   Discharge Medications:   Medication List    STOP taking these medications        HYDROcodone-acetaminophen 5-325 MG tablet  Commonly known as:  NORCO/VICODIN     vitamin B-12 1000 MCG tablet  Commonly known as:  CYANOCOBALAMIN      TAKE these medications        albuterol 108 (90 Base) MCG/ACT inhaler  Commonly known as:  PROVENTIL HFA;VENTOLIN HFA  Inhale 1-2 puffs into the lungs every 6 (six) hours as needed for wheezing or shortness of breath.     cyclobenzaprine 10 MG tablet  Commonly known as:  FLEXERIL  Take 10 mg by mouth 3 (three) times daily as needed for muscle spasms.     dexlansoprazole 60 MG capsule  Commonly known as:  DEXILANT  Take 1 capsule (60 mg total) by mouth daily.     diclofenac 75 MG EC tablet  Commonly known as:  VOLTAREN  Take 75 mg by mouth 2 (two) times daily as needed for mild pain.     FLUoxetine 40 MG capsule  Commonly known as:  PROZAC  Take 40 mg by mouth daily.     gabapentin 300 MG capsule  Commonly known as:  NEURONTIN  Take 300 mg by mouth 3 (three) times daily as needed (for pain).     ibuprofen 600 MG tablet  Commonly known as:  ADVIL,MOTRIN  Take 600 mg by mouth every 6 (six) hours as needed for moderate pain. Reported on 11/26/2015     IRON PO  Take 1 tablet by mouth daily.     lamoTRIgine 25 MG tablet  Commonly known as:  LAMICTAL  Take 25 mg by mouth 2 (two) times daily.     metoprolol tartrate 25 MG tablet  Commonly known as:  LOPRESSOR  Take 0.5 tablets (12.5 mg total) by mouth 2 (two) times daily.     multivitamin tablet  Take 1 tablet by mouth daily.     promethazine 25 MG tablet  Commonly known as:  PHENERGAN  Take 1 tablet (25 mg total) by  mouth every 6 (six) hours as needed for nausea or vomiting.     traMADol 50 MG tablet  Commonly known as:  ULTRAM  Take 50 mg by mouth every 8 (eight) hours as needed for moderate pain or severe pain.     traMADol 50 MG tablet  Commonly known as:  ULTRAM  Take 1-2 tablets (50-100 mg total) by mouth every 6 (six) hours as needed (mild pain).     traZODone 150 MG tablet  Commonly known as:  DESYREL  Take 300 mg by mouth at bedtime as needed for sleep.        Follow Up Appointments: Follow-up Information    Follow up with Gaye Pollack, MD On  02/11/2016.   Specialty:  Cardiothoracic Surgery   Why:  PA/LAT CXR to be taken (at [facility] which is in the same building as Dr. Everrett Coombe office) on 02/11/2016 at 12:15 pm;Appointment time is at 1:00 pm   Contact information:   [place] [address] [phone]       Signed: Cinda Quest 01/27/2016, 8:42 AM

## 2016-01-26 NOTE — Care Management Note (Signed)
Case Management Note  Patient Details  Name: Shawn [name] MRN: [id number] Date of Birth: [birth year]  Subjective/Objective:   Lung nodules, VATS on 01/23/2016               Action/Plan: Discharge Planning: NCM spoke to pt and lives at home with his parents, mother Shawn [name] # [phone]. His parents will provide transportation to his appts. States he can afford his medications at home. States he recently received his Medicaid. Pt has applied for disability. He has a an attorney to help with his claim.   PCP- Soyla Dryer MD   Expected Discharge Date:  01/26/2016              Expected Discharge Plan:  Home/Self Care  In-House Referral:  NA  Discharge planning Services  CM Consult  Post Acute Care Choice:  NA Choice offered to:  NA  DME Arranged:  N/A DME Agency:  NA  HH Arranged:  NA HH Agency:  NA  Status of Service:  Completed, signed off  Medicare Important Message Given:    Date Medicare IM Given:    Medicare IM give by:    Date Additional Medicare IM Given:    Additional Medicare Important Message give by:     If discussed at [facility] of Stay Meetings, dates discussed:    Additional Comments:  Erenest Rasher, RN 01/26/2016, 4:07 PM

## 2016-01-27 ENCOUNTER — Inpatient Hospital Stay (HOSPITAL_COMMUNITY): Payer: Medicaid Other

## 2016-01-27 MED ORDER — METOPROLOL TARTRATE 25 MG PO TABS: 12.5000 mg | ORAL_TABLET | Freq: Two times a day (BID) | ORAL | Status: DC

## 2016-01-27 MED ORDER — TRAMADOL HCL 50 MG PO TABS: 50.0000 mg | ORAL_TABLET | Freq: Four times a day (QID) | ORAL | Status: DC | PRN

## 2016-01-27 NOTE — Progress Notes (Signed)
All discharge instructions per AVS reviewed with patient.  Patient's father present in room.  Pt dressing self in preparation for discharge in care of father.  All questions answered to express satisfaction of patient.

## 2016-01-27 NOTE — Progress Notes (Signed)
      [address]       [place] [address]             [phone]      4 Days Post-Op Procedure(s) (LRB): FLEXIBLE BRONCHOSCOPY (N/A) LEFT VIDEO ASSISTED THORACOSCOPY (Left) LEFT LUNG BIOPSY (N/A)   Subjective:  Mr. Welker has no complaints.  Trying to sleep.  + ambulation  + BM  Objective: Vital signs in last 24 hours: Temp:  [97.7 F (36.5 C)-98.7 F (37.1 C)] 97.7 F (36.5 C) (03/21 0709) Pulse Rate:  [53-85] 63 (03/21 0709) Cardiac Rhythm:  [-] Normal sinus rhythm (03/21 0710) Resp:  [11-21] 17 (03/21 0709) BP: (96-118)/(68-82) 108/77 mmHg (03/21 0709) SpO2:  [91 %-95 %] 95 % (03/21 0709)  Intake/Output from previous day: 03/20 0701 - 03/21 0700 In: 1440 [P.O.:1440] Out: 2019 [Urine:1977; [place]; Chest Tube:40]  General appearance: alert, cooperative and no distress Heart: regular rate and rhythm Lungs: clear to auscultation bilaterally Abdomen: soft, non-tender; bowel sounds normal; no masses,  no organomegaly Wound: clean andd ry  Lab Results:  Recent Labs  01/25/16 0555  WBC 9.3  HGB 13.5  HCT 41.1  PLT 206   BMET:  Recent Labs  01/25/16 0555  NA 138  K 4.2  CL 104  CO2 26  GLUCOSE 108*  BUN 8  CREATININE 0.93  CALCIUM 9.2    PT/INR: No results for input(s): LABPROT, INR in the last 72 hours. ABG    Component Value Date/Time   PHART 7.422 01/24/2016 0425   HCO3 25.4* 01/24/2016 0425   TCO2 26.6 01/24/2016 0425   ACIDBASEDEF 1.4 01/21/2016 1243   O2SAT 96.0 01/24/2016 0425   CBG (last 3)  No results for input(s): GLUCAP in the last 72 hours.  Assessment/Plan: S/P Procedure(s) (LRB): FLEXIBLE BRONCHOSCOPY (N/A) LEFT VIDEO ASSISTED THORACOSCOPY (Left) LEFT LUNG BIOPSY (N/A)  1. Patient stable 2. CV- Sinus Loletha Grayer, continue reduced dose of Lopressor 3. Dispo- patient stable, will get 2V CXR prior to discharge home today  LOS: 4 days    Ahmed Prima, [facility] 01/27/2016

## 2016-01-28 LAB — BLOOD GAS, ARTERIAL
Acid-base deficit: 1.4 mmol/L (ref 0.0–2.0)
Bicarbonate: 22.1 mEq/L (ref 20.0–24.0)
Drawn by: 449841
FIO2: 0.21
O2 Saturation: 97.5 %
PCO2 ART: 32.4 mmHg — AB (ref 35.0–45.0)
PH ART: 7.448 (ref 7.350–7.450)
Patient temperature: 98.6
TCO2: 23.1 mmol/L (ref 0–100)
pO2, Arterial: 96.7 mmHg (ref 80.0–100.0)

## 2016-01-31 ENCOUNTER — Encounter (HOSPITAL_COMMUNITY): Payer: Self-pay | Admitting: Emergency Medicine

## 2016-01-31 ENCOUNTER — Emergency Department (HOSPITAL_COMMUNITY)
Admission: EM | Admit: 2016-01-31 | Discharge: 2016-01-31 | Disposition: A | Discharge status: Home / Self Care | Payer: Medicaid Other | Attending: Emergency Medicine | Admitting: Emergency Medicine

## 2016-01-31 ENCOUNTER — Emergency Department (HOSPITAL_COMMUNITY): Payer: Medicaid Other

## 2016-01-31 DIAGNOSIS — R091 Pleurisy: Secondary | ICD-10-CM

## 2016-01-31 DIAGNOSIS — G8918 Other acute postprocedural pain: Secondary | ICD-10-CM

## 2016-01-31 DIAGNOSIS — Z87891 Personal history of nicotine dependence: Secondary | ICD-10-CM | POA: Diagnosis not present

## 2016-01-31 DIAGNOSIS — E785 Hyperlipidemia, unspecified: Secondary | ICD-10-CM | POA: Diagnosis not present

## 2016-01-31 DIAGNOSIS — Z79899 Other long term (current) drug therapy: Secondary | ICD-10-CM | POA: Diagnosis not present

## 2016-01-31 DIAGNOSIS — I10 Essential (primary) hypertension: Secondary | ICD-10-CM | POA: Insufficient documentation

## 2016-01-31 DIAGNOSIS — J449 Chronic obstructive pulmonary disease, unspecified: Secondary | ICD-10-CM | POA: Diagnosis not present

## 2016-01-31 DIAGNOSIS — R079 Chest pain, unspecified: Secondary | ICD-10-CM | POA: Insufficient documentation

## 2016-01-31 DIAGNOSIS — F319 Bipolar disorder, unspecified: Secondary | ICD-10-CM | POA: Insufficient documentation

## 2016-01-31 LAB — CBC
HCT: 39.7 % (ref 39.0–52.0)
HEMOGLOBIN: 13.7 g/dL (ref 13.0–17.0)
MCH: 32.1 pg (ref 26.0–34.0)
MCHC: 34.5 g/dL (ref 30.0–36.0)
MCV: 93 fL (ref 78.0–100.0)
Platelets: 292 10*3/uL (ref 150–400)
RBC: 4.27 MIL/uL (ref 4.22–5.81)
RDW: 12.2 % (ref 11.5–15.5)
WBC: 7.2 10*3/uL (ref 4.0–10.5)

## 2016-01-31 LAB — BASIC METABOLIC PANEL
ANION GAP: 9 (ref 5–15)
BUN: 12 mg/dL (ref 6–20)
CHLORIDE: 107 mmol/L (ref 101–111)
CO2: 24 mmol/L (ref 22–32)
Calcium: 9.2 mg/dL (ref 8.9–10.3)
Creatinine, Ser: 0.78 mg/dL (ref 0.61–1.24)
GFR calc Af Amer: >60 mL/min (ref >60)
GFR calc non Af Amer: >60 mL/min (ref >60)
GLUCOSE: 106 mg/dL — AB (ref 65–99)
POTASSIUM: 4.4 mmol/L (ref 3.5–5.1)
Sodium: 140 mmol/L (ref 135–145)

## 2016-01-31 LAB — TROPONIN I: Troponin I: <0.03 ng/mL (ref <0.031)

## 2016-01-31 MED ORDER — NAPROXEN 500 MG PO TABS: 500.0000 mg | ORAL_TABLET | Freq: Two times a day (BID) | ORAL | Status: DC

## 2016-01-31 MED ORDER — HYDROMORPHONE HCL 1 MG/ML IJ SOLN
1.0000 mg | INTRAMUSCULAR | Status: DC | PRN
Start: 2016-01-31 — End: 2016-02-01
  Administered 2016-01-31: 1 mg via INTRAVENOUS
  Filled 2016-01-31: qty 1

## 2016-01-31 MED ORDER — OXYCODONE-ACETAMINOPHEN 5-325 MG PO TABS: 2.0000 | ORAL_TABLET | ORAL | Status: DC | PRN

## 2016-01-31 MED ORDER — ONDANSETRON HCL 4 MG/2ML IJ SOLN
4.0000 mg | Freq: Once | INTRAMUSCULAR | Status: AC
  Administered 2016-01-31: 4 mg via INTRAVENOUS
  Filled 2016-01-31: qty 2

## 2016-01-31 MED ORDER — OXYCODONE-ACETAMINOPHEN 5-325 MG PO TABS
2.0000 | ORAL_TABLET | Freq: Once | ORAL | Status: AC
  Administered 2016-01-31: 2 via ORAL
  Filled 2016-01-31: qty 2

## 2016-01-31 MED ORDER — KETOROLAC TROMETHAMINE 30 MG/ML IJ SOLN
30.0000 mg | Freq: Once | INTRAMUSCULAR | Status: AC
  Administered 2016-01-31: 30 mg via INTRAVENOUS
  Filled 2016-01-31: qty 1

## 2016-01-31 MED ORDER — MORPHINE SULFATE (PF) 4 MG/ML IV SOLN
4.0000 mg | INTRAVENOUS | Status: DC | PRN
  Administered 2016-01-31: 4 mg via INTRAVENOUS
  Filled 2016-01-31: qty 1

## 2016-01-31 NOTE — Discharge Instructions (Signed)
Pleurisy Pleurisy is an inflammation and swelling of the lining of the lungs (pleura). Because of this inflammation, it hurts to breathe. It can be aggravated by coughing, laughing, or deep breathing. Pleurisy is often caused by an underlying infection or disease.  HOME CARE INSTRUCTIONS  Monitor your pleurisy for any changes. The following actions may help to alleviate any discomfort you are experiencing:  Medicine may help with pain. Only take over-the-counter or prescription medicines for pain, discomfort, or fever as directed by your health care provider.  Only take antibiotic medicine as directed. Make sure to finish it even if you start to feel better. SEEK MEDICAL CARE IF:   Your pain is not controlled with medicine or is increasing.  You have an increase in pus-like (purulent) secretions brought up with coughing. SEEK IMMEDIATE MEDICAL CARE IF:   You have blue or dark lips, fingernails, or toenails.  You are coughing up blood.  You have increased difficulty breathing.  You have continuing pain unrelieved by medicine or pain lasting more than 1 week.  You have pain that radiates into your neck, arms, or jaw.  You develop increased shortness of breath or wheezing.  You develop a fever, rash, vomiting, fainting, or other serious symptoms. MAKE SURE YOU:  Understand these instructions.   Will watch your condition.   Will get help right away if you are not doing well or get worse.    This information is not intended to replace advice given to you by your health care provider. Make sure you discuss any questions you have with your health care provider.   Document Released: 10/25/2005 Document Revised: 06/27/2013 Document Reviewed: 04/08/2013 Elsevier Interactive Patient Education 2016 [employer]  Pain Medicine Instructions HOW CAN PAIN MEDICINE AFFECT ME? You were given a prescription for pain medicine. This medicine may make you tired or drowsy and may affect  your ability to think clearly. Pain medicine may also affect your ability to drive or perform certain physical activities. It may not be possible to make all of your pain go away, but you should be comfortable enough to move, breathe, and take care of yourself. HOW OFTEN SHOULD I TAKE PAIN MEDICINE AND HOW MUCH SHOULD I TAKE?  Take pain medicine only as directed by your health care provider and only as needed for pain.  You do not need to take pain medicine if you are not having pain, unless directed by your health care provider.  You can take less than the prescribed dose if you find that a smaller amount of medicine controls your pain. WHAT RESTRICTIONS DO I HAVE WHILE TAKING PAIN MEDICINE? Follow these instructions after you start taking pain medicine, while you are taking the medicine, and for 8 hours after you stop taking the medicine:  Do not drive.  Do not operate machinery.  Do not operate power tools.  Do not sign legal documents.  Do not drink alcohol.  Do not take sleeping pills.  Do not supervise children by yourself.  Do not participate in activities that require climbing or being in high places.  Do not enter a body of water--such as a lake, river, ocean, spa, or swimming pool--without an adult nearby who can monitor and help you. HOW CAN I KEEP OTHERS SAFE WHILE I AM TAKING PAIN MEDICINE?  Store your pain medicine as directed by your health care provider. Make sure that it is placed where children and pets cannot reach it.  Never share your pain medicine with anyone.  Do not save any leftover pills. If you have any leftover pain medicine, get rid of it or destroy it as directed by your health care provider. WHAT ELSE DO I NEED TO KNOW ABOUT TAKING PAIN MEDICINE?  Use a stool softener if you become constipated from your pain medicine. Increasing your intake of fruits and vegetables will also help with constipation.  Write down the times when you take your pain  medicine. Look at the times before you take your next dose of medicine. It is easy to become confused while on pain medicine. Recording the times helps you to avoid an overdose.  If your pain is severe, do not try to treat it yourself by taking more pills than instructed on your prescription. Contact your health care provider for help.  You may have been prescribed a pain medicine that contains acetaminophen. Do not take any other acetaminophen while taking this medicine. An overdose of acetaminophen can result in severe liver damage. Acetaminophen is found in many over-the-counter (OTC) and prescription medicines. If you are taking any medicines in addition to your pain medicine, check the active ingredients on those medicines to see if acetaminophen is listed. WHEN SHOULD I CALL MY HEALTH CARE PROVIDER?  Your medicine is not helping to make the pain go away.  You vomit or have diarrhea shortly after taking the medicine.  You develop new pain in areas that did not hurt before.  You have an allergic reaction to your medicine. This may include:  Itchiness.  Swelling.  Dizziness.  Developing a new rash. WHEN SHOULD I CALL 911 OR GO TO THE EMERGENCY ROOM?  You feel dizzy or you faint.  You are very confused or disoriented.  You repeatedly vomit.  Your skin or lips turn pale or bluish in color.  You have shortness of breath or you are breathing much more slowly than usual.  You have a severe allergic reaction to your medicine. This includes:  Developing tongue swelling.  Having difficulty breathing.   This information is not intended to replace advice given to you by your health care provider. Make sure you discuss any questions you have with your health care provider.   Document Released: 01/31/2001 Document Revised: 03/11/2015 Document Reviewed: 08/29/2014 Elsevier Interactive Patient Education [employer].

## 2016-01-31 NOTE — ED Notes (Signed)
CRITICAL VALUE ALERT  Critical value received:  Potassium 7.4  Date of notification:  01/31/16  Time of notification:  2053  Critical value read back: yes  Nurse who received alert:  Tilden Fossa RN  MD notified (1st page):  Jeneen Rinks  Time of first page:  2054  MD notified (2nd page):  Time of second page:  Responding MD: Jeneen Rinks  Time MD responded:  2054

## 2016-01-31 NOTE — ED Notes (Signed)
Repeat Potassium resulted at 4.4, MD Jeneen Rinks notified.

## 2016-01-31 NOTE — ED Notes (Signed)
Patient complaining of left sided chest pain starting approximately 3 hours prior to arrival to ED. Denies radiation. States he had lung nodules removed from left lung 1 week ago. States pain is worse with deep breathing and movement.

## 2016-01-31 NOTE — ED Provider Notes (Signed)
CSN: [id number]     Arrival date & time 01/31/16  1940 History   First MD Initiated Contact with Patient 01/31/16 1952     Chief Complaint  Patient presents with  . Chest Pain      HPI  Patient presents with severe left-sided pleuritic chest pain "all day" today.  Pt is 8 days status post VATS procedure with Dr. Arvid Right for open lung biopsy of multiple pulmonary nodules on 3/17.  His chest tube was removed on Sunday, he was discharged on Monday. Today is Saturday. Had some mild discomfort at home that was being controlled with tramadol. Today he had worsening sharp left-sided pain. No cough. Is not short of breath. No hemoptysis, no fever. No drainage from his chest tube site, which remains dressed, or incisions.  Past Medical History  Diagnosis Date  . Arthritis   . Back pain   . Bipolar 1 disorder (HCC)   . GERD (gastroesophageal reflux disease)   . Diverticulitis   . Vomiting   . COPD (chronic obstructive pulmonary disease) (HCC)   . Lung nodules   . Bipolar disorder (HCC)   . Schizoaffective disorder, bipolar type (HCC)   . Heart disease     "irregular heart beat"  . Anemia   . Hyperlipidemia   . Hypertension   . Family history of adverse reaction to anesthesia     he was adopted  . 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 HOH (hard of hearing)     biological parents are both deaf.   left ear is good.   Past Surgical History  Procedure Laterality Date  . Brain surgery  2005    after head injury, patient states had 2 large blood clots evacuated  . Colonoscopy with esophagogastroduodenoscopy (egd) N/A 12/05/2013    [id number] erosive relfux/HH/melanosis coli/colonic diverticulosis. tubulovillous adenoma removed. next tcs 11/2016  . Flexible bronchoscopy N/A 01/23/2016    Procedure: FLEXIBLE BRONCHOSCOPY;  Surgeon: Gaye Pollack, MD;  Location: [facility] OR;  Service: Thoracic;  Laterality: N/A;  . Video assisted thoracoscopy Left  01/23/2016    Procedure: LEFT VIDEO ASSISTED THORACOSCOPY;  Surgeon: Gaye Pollack, MD;  Location: [facility] OR;  Service: Thoracic;  Laterality: Left;  . Lung biopsy N/A 01/23/2016    Procedure: LEFT LUNG BIOPSY;  Surgeon: Gaye Pollack, MD;  Location: [facility] OR;  Service: Thoracic;  Laterality: N/A;   Family History  Problem Relation Age of Onset  . Adopted: Yes  . Colon cancer Neg Hx     adopted at 91 months old, unsure about GI history of parents   . Alzheimer's disease Other   . Parkinson's disease Other   . Mental illness Other    Social History  Substance Use Topics  . Smoking status: Former Smoker -- 0.75 packs/day for 25 years    Types: Cigars, Cigarettes    Quit date: 07/09/2014  . Smokeless tobacco: Current User    Types: Snuff     Comment: pt uses dip tobacco  . Alcohol Use: 0.0 oz/week    0 Standard drinks or equivalent per week     Comment: "once in awhile" average 1-2 times a week with 1-2 drinks per sitting    Review of Systems  Constitutional: Negative for fever, chills, diaphoresis, appetite change and fatigue.  HENT: Negative for mouth sores, sore throat and trouble swallowing.   Eyes: Negative for visual disturbance.  Respiratory:  Negative for cough, chest tightness, shortness of breath and wheezing.   Cardiovascular: Positive for chest pain.  Gastrointestinal: Negative for nausea, vomiting, abdominal pain, diarrhea and abdominal distention.  Endocrine: Negative for polydipsia, polyphagia and polyuria.  Genitourinary: Negative for dysuria, frequency and hematuria.  Musculoskeletal: Negative for gait problem.  Skin: Negative for color change, pallor and rash.  Neurological: Negative for dizziness, syncope, light-headedness and headaches.  Hematological: Does not bruise/bleed easily.  Psychiatric/Behavioral: Negative for behavioral problems and confusion.      Allergies  Review of patient's allergies indicates no known allergies.  Home Medications   Prior to  Admission medications   Medication Sig Start Date End Date Taking? Authorizing Provider  cyclobenzaprine (FLEXERIL) 10 MG tablet Take 10 mg by mouth 3 (three) times daily as needed for muscle spasms.   Yes Historical Provider, MD  dexlansoprazole (DEXILANT) 60 MG capsule Take 1 capsule (60 mg total) by mouth daily. 07/03/15  Yes Mahala Menghini, PA-C  diclofenac (VOLTAREN) 75 MG EC tablet Take 75 mg by mouth 2 (two) times daily as needed for mild pain.    Yes Historical Provider, MD  FLUoxetine (PROZAC) 40 MG capsule Take 40 mg by mouth daily.   Yes Historical Provider, MD  gabapentin (NEURONTIN) 300 MG capsule Take 300 mg by mouth 3 (three) times daily as needed (for pain).    Yes Historical Provider, MD  lamoTRIgine (LAMICTAL) 25 MG tablet Take 25 mg by mouth 2 (two) times daily.    Yes Historical Provider, MD  metoprolol tartrate (LOPRESSOR) 25 MG tablet Take 0.5 tablets (12.5 mg total) by mouth 2 (two) times daily. 01/27/16  Yes Erin R Barrett, PA-C  Multiple Vitamin (MULTIVITAMIN) tablet Take 1 tablet by mouth daily.   Yes Historical Provider, MD  traMADol (ULTRAM) 50 MG tablet Take 1-2 tablets (50-100 mg total) by mouth every 6 (six) hours as needed (mild pain). 01/27/16  Yes Erin R Barrett, PA-C  traZODone (DESYREL) 150 MG tablet Take 300 mg by mouth at bedtime as needed for sleep.    Yes Historical Provider, MD  albuterol (PROVENTIL HFA;VENTOLIN HFA) 108 (90 BASE) MCG/ACT inhaler Inhale 1-2 puffs into the lungs every 6 (six) hours as needed for wheezing or shortness of breath.    Historical Provider, MD  naproxen (NAPROSYN) 500 MG tablet Take 1 tablet (500 mg total) by mouth 2 (two) times daily. 01/31/16   Tanna Furry, MD  oxyCODONE-acetaminophen (PERCOCET/ROXICET) 5-325 MG tablet Take 2 tablets by mouth every 4 (four) hours as needed. 01/31/16   Tanna Furry, MD  promethazine (PHENERGAN) 25 MG tablet Take 1 tablet (25 mg total) by mouth every 6 (six) hours as needed for nausea or vomiting. 11/05/15    Dorie Rank, MD   BP 119/82 mmHg  Pulse 69  Temp(Src) 98.7 F (37.1 C) (Oral)  Resp 12  Ht 5\' 11"  (1.803 m)  Wt 185 lb (83.915 kg)  BMI 25.81 kg/m2  SpO2 99% Physical Exam  Constitutional: He is oriented to person, place, and time. He appears well-developed and well-nourished. No distress.  HENT:  Head: Normocephalic.  Eyes: Conjunctivae are normal. Pupils are equal, round, and reactive to light. No scleral icterus.  Neck: Normal range of motion. Neck supple. No thyromegaly present.  Cardiovascular: Normal rate and regular rhythm.  Exam reveals no gallop and no friction rub.   No murmur heard. Pulmonary/Chest: Effort normal and breath sounds normal. No respiratory distress. He has no wheezes. He has no rales.  Slight diminished bi-basilar  breath sounds. Overall no prolongation. No crackles or rales. No palpable subcutaneous air or crepitus in the neck or chest.  Abdominal: Soft. Bowel sounds are normal. He exhibits no distension. There is no tenderness. There is no rebound.  Musculoskeletal: Normal range of motion.  Neurological: He is alert and oriented to person, place, and time.  Skin: Skin is warm and dry. No rash noted.  Psychiatric: He has a normal mood and affect. His behavior is normal.    ED Course  Procedures (including critical care time) Labs Review Labs Reviewed  BASIC METABOLIC PANEL - Abnormal; Notable for the following:    Sodium 146 (*)    Potassium 7.4 (*)    Glucose, Bld 102 (*)    All other components within normal limits  BASIC METABOLIC PANEL - Abnormal; Notable for the following:    Glucose, Bld 106 (*)    All other components within normal limits  CBC  TROPONIN I    Imaging Review Dg Chest 2 View  01/31/2016  CLINICAL DATA:  Left-sided chest pain for 3 hours. Shortness of breath with productive cough EXAM: CHEST  2 VIEW COMPARISON:  01/27/2016 FINDINGS: Persistent changes are noted in the left lung base consistent with a small effusion and some  scarring related to the previous surgery. The right lung remains clear. No pneumothorax is noted. No other focal abnormality is seen. IMPRESSION: Postsurgical changes on the left stable from the prior exam. No acute abnormality is noted. Electronically Signed   By: Inez Catalina M.D.   On: 01/31/2016 20:46   I have personally reviewed and evaluated these images and lab results as part of my medical decision-making.   EKG Interpretation   Date/Time:  Saturday January 31 2016 19:45:58 EDT Ventricular Rate:  83 PR Interval:  165 QRS Duration: 84 QT Interval:  344 QTC Calculation: 404 R Axis:   10 Text Interpretation:  Sinus rhythm Abnormal R-wave progression, early  transition Left ventricular hypertrophy y Confirmed by Jeneen Rinks  MD, [place]  [phone]) on 01/31/2016 9:27:25 PM      MDM   Final diagnoses:  Pleurisy  Postoperative pain   Patient with normal chest x-ray. No recurrence of pneumothorax status post chest tube removal. Pain control with IV medications and anti-inflammatories. Not hypoxemic, not tachycardic. I discussed the case with Dr. Caffie Pinto. He felt this was consistent with postoperative pleuritic pain. Patient discharged with pain control measures including limited number Percocet, naproxen, then continue/resume Ultram. Follow-up with Dr. Caffie Pinto.    Tanna Furry, MD 01/31/16 2222

## 2016-02-01 LAB — BASIC METABOLIC PANEL
ANION GAP: 15 (ref 5–15)
BUN: 13 mg/dL (ref 6–20)
CALCIUM: 9.1 mg/dL (ref 8.9–10.3)
CO2: 24 mmol/L (ref 22–32)
CREATININE: 0.79 mg/dL (ref 0.61–1.24)
Chloride: 107 mmol/L (ref 101–111)
Glucose, Bld: 102 mg/dL — ABNORMAL HIGH (ref 65–99)
Potassium: 7.4 mmol/L (ref 3.5–5.1)
SODIUM: 146 mmol/L — AB (ref 135–145)

## 2016-02-02 DIAGNOSIS — R0600 Dyspnea, unspecified: Secondary | ICD-10-CM | POA: Diagnosis not present

## 2016-02-05 ENCOUNTER — Ambulatory Visit: Payer: Medicaid Other | Admitting: Pulmonary Disease

## 2016-02-10 ENCOUNTER — Other Ambulatory Visit: Payer: Self-pay | Admitting: Surgery

## 2016-02-10 DIAGNOSIS — R918 Other nonspecific abnormal finding of lung field: Secondary | ICD-10-CM

## 2016-02-11 ENCOUNTER — Ambulatory Visit
Admission: RE | Admit: 2016-02-11 | Discharge: 2016-02-11 | Disposition: A | Discharge status: Home / Self Care | Payer: Medicaid Other | Source: Ambulatory Visit | Attending: Surgery | Admitting: Surgery

## 2016-02-11 ENCOUNTER — Ambulatory Visit (INDEPENDENT_AMBULATORY_CARE_PROVIDER_SITE_OTHER): Payer: Self-pay | Admitting: Surgery

## 2016-02-11 ENCOUNTER — Encounter: Payer: Self-pay | Admitting: Surgery

## 2016-02-11 VITALS — BP 135/84 | HR 86 | Resp 20 | Ht 71.0 in | Wt 185.0 lb

## 2016-02-11 DIAGNOSIS — R918 Other nonspecific abnormal finding of lung field: Secondary | ICD-10-CM

## 2016-02-11 NOTE — Progress Notes (Signed)
HPI: Patient returns for routine postoperative follow-up having undergone left VATS with lung biopsy on 01/23/2016. The patient's early postoperative recovery while in the hospital was notable for an uncomplicated postop course. Since hospital discharge the patient reports that he has recovered and is back to his baseline exertional dyspnea and cough. 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   Current Outpatient Prescriptions  Medication Sig Dispense Refill  . albuterol (PROVENTIL HFA;VENTOLIN HFA) 108 (90 BASE) MCG/ACT inhaler Inhale 1-2 puffs into the lungs every 6 (six) hours as needed for wheezing or shortness of breath.    . cyclobenzaprine (FLEXERIL) 10 MG tablet Take 10 mg by mouth 3 (three) times daily as needed for muscle spasms.    Marland Kitchen dexlansoprazole (DEXILANT) 60 MG capsule Take 1 capsule (60 mg total) by mouth daily. 20 capsule 0  . diclofenac (VOLTAREN) 75 MG EC tablet Take 75 mg by mouth 2 (two) times daily as needed for mild pain.     Marland Kitchen FLUoxetine (PROZAC) 40 MG capsule Take 40 mg by mouth daily.    Marland Kitchen gabapentin (NEURONTIN) 300 MG capsule Take 300 mg by mouth 3 (three) times daily as needed (for pain).     Marland Kitchen lamoTRIgine (LAMICTAL) 25 MG tablet Take 25 mg by mouth 2 (two) times daily.     . metoprolol tartrate (LOPRESSOR) 25 MG tablet Take 0.5 tablets (12.5 mg total) by mouth 2 (two) times daily.    . Multiple Vitamin (MULTIVITAMIN) tablet Take 1 tablet by mouth daily.    . naproxen (NAPROSYN) 500 MG tablet Take 1 tablet (500 mg total) by mouth 2 (two) times daily. 30 tablet 0  . oxyCODONE-acetaminophen (PERCOCET/ROXICET) 5-325 MG tablet Take 2 tablets by mouth every 4 (four) hours as needed. 6 tablet 0  . promethazine (PHENERGAN) 25 MG tablet Take 1 tablet (25 mg total) by mouth every 6 (six) hours as needed for nausea or vomiting. 16 tablet 0  . traMADol (ULTRAM) 50 MG tablet Take 1-2 tablets  (50-100 mg total) by mouth every 6 (six) hours as needed (mild pain). 30 tablet 0  . traZODone (DESYREL) 150 MG tablet Take 300 mg by mouth at bedtime as needed for sleep.      No current facility-administered medications for this visit.   FINAL DIAGNOSIS Diagnosis 1. 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 - NO MALIGNANCY IDENTIFIED. 2. Lung, wedge biopsy/resection, left lower lobe - DIFFUSE LYMPHOID HYPERPLASIA WITH PLASMACYTOSIS AND INCREASED IGG4 EXPRESSION, SEE COMMENT. - NO MALIGNANCY IDENTIFIED. 3. 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 - NO MALIGNANCY IDENTIFIED. Microscopic Comment 1. -3. The case was sent for outside consultation to [facility] with the final diagnosis as above. Please see report [id number] for detailed report. Summarized finding are below. Sections of lung reveal peribronchial and perivascular lymphoid infiltrates. This includes reactive appearing germinal centers and abundant plasma cells. There is associated fibrosis in some of the areas. There is no vasculitis or endothelialitis. There is no evidence of an interstitial lung disease. Immunohistochemistry reveals CD20 positive B-cell follicles with surround CD3, CD5, and CD43 positive T-cells. CD138 highlights the abundant plasma cells which are polytypic by light chain in situ hybridization. The germinal centers are positive for CD10 and bcl-6, while negative for bcl-2. 0000000 reveals follicular dendritic networks.  Flow cytometry [id number]) is negative for a monoclonal B-cell or phenotypically aberrant T-cell population. PCR for B-cell gene rearrangement (performed at the outside institution) is reportedly negative. IgG4 immunostain (performed at the outside institution) reveals increased expression in  the plasma cells. Although not specific alone, this finding raises the possibility of an IgG4-related lung disease. Clinical correlation and correlation with laboratory studies (IgG4 serum levels) may be of utility. The final findings were discussed with Dr. Cyndia Bent on 02/10/2016. Vicente Males MD Pathologist, Electronic Signature (Case signed 02/10/2016)  Physical Exam: BP 135/84 mmHg  Pulse 86  Resp 20  Ht 5\' 11"  (1.803 m)  Wt 185 lb (83.915 kg)  BMI 25.81 kg/m2  SpO2 96% He looks well Lungs are clear The left chest incisions are healing well. The chest tube sutures were removed.  Diagnostic Tests:  CLINICAL DATA: Left lung biopsy.  EXAM: CHEST 2 VIEW  COMPARISON: 01/31/2016. 01/07/2016.  FINDINGS: Mediastinum and hilar structures normal. The stable mild cardiomegaly. Mild left base atelectasis and/or infiltrate. Small left pleural effusion. These findings have improved slightly from prior exam . No pneumothorax P  IMPRESSION: Mild left base atelectasis and/or infiltrate small left pleural effusion. These findings have improved from prior study 01/31/2016.   Electronically Signed  By: Marcello Moores Register  On: 02/11/2016 10:09   Impression:  He is recovering from his VATS lung biopsy. 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 I told him that he can return to activity as tolerated.   Plan:  He has an appt tomorrow with Dr. Ashok Cordia who will decide on the treatment regimen. I will be happy to see him back if the need arises.   Gaye Pollack, MD [facility] [phone]

## 2016-02-12 ENCOUNTER — Ambulatory Visit (INDEPENDENT_AMBULATORY_CARE_PROVIDER_SITE_OTHER): Payer: Medicaid Other | Admitting: Pulmonary Disease

## 2016-02-12 ENCOUNTER — Encounter: Payer: Self-pay | Admitting: Pulmonary Disease

## 2016-02-12 ENCOUNTER — Other Ambulatory Visit: Payer: Medicaid Other

## 2016-02-12 VITALS — BP 128/68 | HR 83 | Ht 71.0 in | Wt 194.0 lb

## 2016-02-12 DIAGNOSIS — D8989 Other specified disorders involving the immune mechanism, not elsewhere classified: Secondary | ICD-10-CM | POA: Insufficient documentation

## 2016-02-12 DIAGNOSIS — Z23 Encounter for immunization: Secondary | ICD-10-CM

## 2016-02-12 MED ORDER — PREDNISONE 5 MG PO TABS: ORAL_TABLET | ORAL | Status: DC

## 2016-02-12 NOTE — Progress Notes (Signed)
Subjective:    Patient ID: Shawn [name], male    DOB: Aug 18, 1971, 45 y.o.   MRN: [id number]  C.C.:  Follow-up for IgG4 Lung Disease.  HPI  IgG4 Lung Disease:  Identified on VATS biopsy. Likely cause for patient's dyspnea with no evidence of COPD and normal cardiopulmonary exercise testing. Continues to have ongoing dyspnea. No new wheezing or cough complaint. Heart  Review of Systems denies any chest pain or pressure. Denies any subjective fever, chills, or sweats.  No Known Allergies  Current Outpatient Prescriptions on File Prior to Visit  Medication Sig Dispense Refill  . albuterol (PROVENTIL HFA;VENTOLIN HFA) 108 (90 BASE) MCG/ACT inhaler Inhale 1-2 puffs into the lungs every 6 (six) hours as needed for wheezing or shortness of breath.    . cyclobenzaprine (FLEXERIL) 10 MG tablet Take 10 mg by mouth 3 (three) times daily as needed for muscle spasms.    Marland Kitchen dexlansoprazole (DEXILANT) 60 MG capsule Take 1 capsule (60 mg total) by mouth daily. 20 capsule 0  . diclofenac (VOLTAREN) 75 MG EC tablet Take 75 mg by mouth 2 (two) times daily as needed for mild pain.     Marland Kitchen FLUoxetine (PROZAC) 40 MG capsule Take 40 mg by mouth daily.    Marland Kitchen gabapentin (NEURONTIN) 300 MG capsule Take 300 mg by mouth 3 (three) times daily as needed (for pain).     Marland Kitchen lamoTRIgine (LAMICTAL) 25 MG tablet Take 25 mg by mouth 2 (two) times daily.     . metoprolol tartrate (LOPRESSOR) 25 MG tablet Take 0.5 tablets (12.5 mg total) by mouth 2 (two) times daily.    . Multiple Vitamin (MULTIVITAMIN) tablet Take 1 tablet by mouth daily.    . naproxen (NAPROSYN) 500 MG tablet Take 1 tablet (500 mg total) by mouth 2 (two) times daily. 30 tablet 0  . oxyCODONE-acetaminophen (PERCOCET/ROXICET) 5-325 MG tablet Take 2 tablets by mouth every 4 (four) hours as needed. 6 tablet 0  . promethazine (PHENERGAN) 25 MG tablet Take 1 tablet (25 mg total) by mouth every 6 (six) hours as needed for nausea or vomiting. 16 tablet 0  . traMADol  (ULTRAM) 50 MG tablet Take 1-2 tablets (50-100 mg total) by mouth every 6 (six) hours as needed (mild pain). 30 tablet 0  . traZODone (DESYREL) 150 MG tablet Take 300 mg by mouth at bedtime as needed for sleep.      No current facility-administered medications on file prior to visit.    Past Medical History  Diagnosis Date  . Arthritis   . Back pain   . Bipolar 1 disorder ([place])   . GERD (gastroesophageal reflux disease)   . Diverticulitis   . Vomiting   . COPD (chronic obstructive pulmonary disease) ([place])   . Lung nodules   . Bipolar disorder ([place])   . Schizoaffective disorder, bipolar type ([place])   . Heart disease     "irregular heart beat"  . Anemia   . Hyperlipidemia   . Hypertension   . Family history of adverse reaction to anesthesia     he was adopted  . 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 HOH (hard of hearing)     biological parents are both deaf.   left ear is good.    Past Surgical History  Procedure Laterality Date  . Brain surgery  2005    after head injury, patient states had 2 large blood clots  evacuated  . Colonoscopy with esophagogastroduodenoscopy (egd) N/A 12/05/2013    [id number] erosive relfux/HH/melanosis coli/colonic diverticulosis. tubulovillous adenoma removed. next tcs 11/2016  . Flexible bronchoscopy N/A 01/23/2016    Procedure: FLEXIBLE BRONCHOSCOPY;  Surgeon: Gaye Pollack, MD;  Location: [facility] OR;  Service: Thoracic;  Laterality: N/A;  . Video assisted thoracoscopy Left 01/23/2016    Procedure: LEFT VIDEO ASSISTED THORACOSCOPY;  Surgeon: Gaye Pollack, MD;  Location: [facility] OR;  Service: Thoracic;  Laterality: Left;  . Lung biopsy N/A 01/23/2016    Procedure: LEFT LUNG BIOPSY;  Surgeon: Gaye Pollack, MD;  Location: [facility] OR;  Service: Thoracic;  Laterality: N/A;    Family History  Problem Relation Age of Onset  . Adopted: Yes  . Colon cancer Neg Hx     adopted at 16 months old, unsure about GI history of parents    . Alzheimer's disease Other   . Parkinson's disease Other   . Mental illness Other     Social History   Social History  . Marital Status: Divorced    Spouse Name: N/A  . Number of Children: 1  . Years of Education: N/A   Occupational History  . disabled    Social History Main Topics  . Smoking status: Former Smoker -- 0.75 packs/day for 25 years    Types: Cigars, Cigarettes    Quit date: 07/09/2014  . Smokeless tobacco: Current User    Types: Snuff     Comment: pt uses dip tobacco  . Alcohol Use: 0.0 oz/week    0 Standard drinks or equivalent per week     Comment: "once in awhile" average 1-2 times a week with 1-2 drinks per sitting  . Drug Use: No  . Sexual Activity: Not Asked   Other Topics Concern  . None   Social History Narrative   He is originally from [place]. 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 Has always lived in [place]. Previously have traveled to [place], [place], [place], [place], [place]. Remote travel to [place]. 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 Lived in a house with mold around 2001-2002 as well as 2006. Has 2 dogs currently. No bird exposure. No hot tub exposure. Mainly walks for fun. He was incarcerated for 2 years previously. No known TB exposure. Had previous negative PPDs last around 2010. Never lived in a homeless shelter but has eaten at them before.       Objective:   Physical Exam BP 128/68 mmHg  Pulse 83  Ht 5\' 11"  (1.803 m)  Wt 194 lb (87.998 kg)  BMI 27.07 kg/m2  SpO2 94% General:  Awake. Alert. Father with him today. No distress. Integument:  Warm & dry. No rash on exposed skin.  Lymphatics:  No appreciated cervical or supraclavicular lymphadenoapthy. HEENT:  Moist mucus membranes. No oral ulcers. No scleral injection. Cardiovascular:  Regular rate. No edema. Normal S1 & S2. Pulmonary:  Clear bilaterally to auscultation bilaterally. Normal work of breathing on room air. Musculoskeletal:  No joint effusion or deformity  appreciated. Normal bulk and tone.  PFT 01/06/16: FVC 4.66 L (87%) FEV1 3.86 L (92%) FEV1/FVC 0.83 FEF 25-75 4.78 L (125%) no bronchodilator response TLC 6.74 L (94%) RV 66% ERV 23% DLCO uncorrected 103%  CPET (01/14/16): Normal functional capacity with no clear ventilatory or circulatory limitations to exercise.  6MWT 01/06/16:  Walked 528 meters / Baseline Sat 97% on RA / Nadir 97% on RA @ rest (c/o pain in right rib  cage, clammy sensation & tightness in chest)  IMAGING CT CHEST W/O 10/20/15 (previously reviewed by me): Pulmonary nodularity diffusely in bronchovascular distribution width 1.4 cm groundglass nodule left upper lobe. Additional subcentimeter groundglass nodules noted.  Cystic changes noted bilaterally with largest within right lower lobe. Subcentimeter mediastinal lymph nodes without pathologic enlargement. No pleural effusion or thickening. No pericardial effusion. No esophageal dilation or mucosal thickening. Nodules appear essentially unchanged when compared with CT scan from 2015. There do appear to be more cystic areas within the lungs on my review.  PATHOLOGY LUL WEDGE RESECTION (01/23/16): Diffuse lymphoid hyperplasia with plasmacytosis and increased IgG4 expression. No evidence of malignancy.  LABS 12/22/15 Alpha-1 antitrypsin: MM (136) ANA: Negative Rheumatoid factor: 10 Anti-CCP:  <16 SSA:  <1 SSB:  <1 Quantiferon TB:  Negative (PPD negative) HIV:  Negative   11/05/15 CBC: 10.4/15.3/44.1/250 BMP: 141/4.7/103/28/21/1.46/108/9.7 LFT: 4.2/7.6/0.5/45/28/44    Assessment & Plan:  45 year old Caucasian male with bilateral pulmonary nodules as well as a few cysts within his lungs quite probably secondary to underlying IgG4 related lung disease. Lung biopsy from his recent VATS was reviewed and is consistent with this disease. I suspect his dyspnea is likely secondary to the disease as well without evidence of COPD on spirometry and normal cardiopulmonary exercise  testing which was reviewed with him at this appointment. Discussed initiating further immunosuppression with rituximab but I would favor starting with prednisone given potential complications with rituximab.  1. IgG4 Related Lung Disease: Checking quantitative IgG4 subclass. Starting patient on prednisone at 50 mg by mouth daily and tapering by 5 mg every 2 weeks. Repeat spirometry with DLCO and 6 minute walk test on room air at next appointment. Consider repeat imaging after next appointment depending upon symptomatic control. 2. Health Maintenance: Administering Pneumovax today. 3. Follow-up: Patient to return to clinic in 4-6 weeks.  Sonia Baller Ashok Cordia, M.D. [facility] Pager:  [phone] After 3pm or if no response, call [phone] 2:46 PM 02/12/2016

## 2016-02-12 NOTE — Patient Instructions (Signed)
   I'm starting you on a prednisone taper:  Take 10 pills daily for 14 days then  Take 9 pills daily for 14 days then  Take 8 pills daily until you see me back in clinic  He will have a breathing and walking test at your next appointment  I will see back in 4-6 weeks. Please call me if you have any further questions or concerns  TESTS ORDERED: 1. 6 minute walk test on room air at next appointment 2. Spirometry with DLCO at next appointment 3. Quantitative IgG subclass today

## 2016-02-16 ENCOUNTER — Encounter (HOSPITAL_COMMUNITY): Payer: Self-pay

## 2016-02-16 LAB — IGG SUBCLASSES
IGG SUBCLASS 2: 381 mg/dL (ref 241–700)
IGG SUBCLASS 4: 58.4 mg/dL (ref 4.0–86.0)
IgG Subclass 1: 931 mg/dL — ABNORMAL HIGH (ref 382–929)
IgG Subclass 3: 84 mg/dL (ref 22–178)

## 2016-02-18 ENCOUNTER — Other Ambulatory Visit (HOSPITAL_COMMUNITY): Payer: Self-pay | Admitting: Family

## 2016-02-18 ENCOUNTER — Telehealth: Payer: Self-pay | Admitting: Pulmonary Disease

## 2016-02-18 ENCOUNTER — Ambulatory Visit (HOSPITAL_COMMUNITY)
Admission: RE | Admit: 2016-02-18 | Discharge: 2016-02-18 | Disposition: A | Discharge status: Home / Self Care | Payer: Medicaid Other | Source: Ambulatory Visit | Attending: Family | Admitting: Family

## 2016-02-18 DIAGNOSIS — M545 Low back pain: Secondary | ICD-10-CM | POA: Insufficient documentation

## 2016-02-18 DIAGNOSIS — M542 Cervicalgia: Secondary | ICD-10-CM | POA: Diagnosis present

## 2016-02-18 DIAGNOSIS — M5136 Other intervertebral disc degeneration, lumbar region: Secondary | ICD-10-CM | POA: Diagnosis not present

## 2016-02-18 DIAGNOSIS — M50322 Other cervical disc degeneration at C5-C6 level: Secondary | ICD-10-CM | POA: Diagnosis not present

## 2016-02-18 NOTE — Telephone Encounter (Signed)
LMTCB

## 2016-02-18 NOTE — Telephone Encounter (Signed)
He needs to remain on prednisone.  The weird dreams and diarrhea should get better as he tapers his dose of prednisone.  He can try imodium prn for diarrhea.

## 2016-02-18 NOTE — Telephone Encounter (Signed)
Patient states that since he has been on Prednisone he has had diarrhea, weird dreams.  Still having SOB, out of breath with walking.  Patient has a rescue inhaler.  He was on Symbicort but Dr. Ashok Cordia took patient off of the Symbicort.    Pharmacy: [facility]  No Known Allergies

## 2016-02-19 ENCOUNTER — Emergency Department (HOSPITAL_COMMUNITY)
Admission: EM | Admit: 2016-02-19 | Discharge: 2016-02-20 | Disposition: A | Discharge status: Home / Self Care | Payer: Medicaid Other | Attending: Emergency Medicine | Admitting: Emergency Medicine

## 2016-02-19 ENCOUNTER — Encounter (HOSPITAL_COMMUNITY): Payer: Self-pay

## 2016-02-19 ENCOUNTER — Other Ambulatory Visit: Payer: Self-pay

## 2016-02-19 DIAGNOSIS — Z87891 Personal history of nicotine dependence: Secondary | ICD-10-CM | POA: Insufficient documentation

## 2016-02-19 DIAGNOSIS — Z79899 Other long term (current) drug therapy: Secondary | ICD-10-CM | POA: Insufficient documentation

## 2016-02-19 DIAGNOSIS — E785 Hyperlipidemia, unspecified: Secondary | ICD-10-CM | POA: Diagnosis not present

## 2016-02-19 DIAGNOSIS — F250 Schizoaffective disorder, bipolar type: Secondary | ICD-10-CM | POA: Diagnosis not present

## 2016-02-19 DIAGNOSIS — I10 Essential (primary) hypertension: Secondary | ICD-10-CM | POA: Insufficient documentation

## 2016-02-19 DIAGNOSIS — F319 Bipolar disorder, unspecified: Secondary | ICD-10-CM | POA: Insufficient documentation

## 2016-02-19 DIAGNOSIS — R0602 Shortness of breath: Secondary | ICD-10-CM | POA: Insufficient documentation

## 2016-02-19 DIAGNOSIS — J449 Chronic obstructive pulmonary disease, unspecified: Secondary | ICD-10-CM | POA: Insufficient documentation

## 2016-02-19 DIAGNOSIS — R002 Palpitations: Secondary | ICD-10-CM | POA: Diagnosis not present

## 2016-02-19 MED ORDER — SODIUM CHLORIDE 0.9 % IV BOLUS (SEPSIS)
1000.0000 mL | Freq: Once | INTRAVENOUS | Status: AC
  Administered 2016-02-20: 1000 mL via INTRAVENOUS

## 2016-02-19 NOTE — ED Notes (Signed)
Pt states he started feeling sob about an hour ago, states he feels like his heart is racing.  Pt reports minor chest discomfort/tightness.

## 2016-02-19 NOTE — ED Provider Notes (Signed)
By signing my name below, I, Shawn [name], attest that this documentation has been prepared under the direction and in the presence of [employer], DO. Electronically Signed: Eustaquio [name], ED Scribe. 02/19/2016. 11:58 PM.  TIME SEEN: 11:48 PM  CHIEF COMPLAINT: Shortness of breath  HPI:  Shawn [name] is a 45 y.o. male with PMHx IgG4 lung disease (diagnosed by lung biopsy of multiple pulmonary nodules and LUL wedge resection on 3/17 by Dr. Cyndia Bent), HTN, HLD, who presents to the Emergency Department complaining of gradual onset, constant, shortness of breath that began earlier tonight around 9 PM (approximately 3 hours ago). Pt reports that he was sitting on the couch when he began feeling short of breath. He also complains of palpitations (feels like his heart is racing) and a dry cough. His symptoms are worsened with walking for the past year. Pt has had similar symptoms with his lung disease in the past but states he has never had symptoms while at rest like he had tonight. He reports that his palpitations and shortness of breath are gradually improving but still present. Pt is currently on Prednisone which is prescribed by his pulmonologist, Dr. Ashok Cordia. Denies fever, chest pain, or any other associated symptoms. No Hx DVT/PE. Pt is non smoker but does use chewing tobacco. He does not appear patient has had a recent stress test. He did have an exercise test to check his pulmonary function which was normal at the beginning of March 2017. He is an occasional drinking. Denies illicit drug use. No change in caffeine intake recently. States he drinks 2 caffeinated beverages a day.  States he uses dip.  Does not smoke.   ROS: See HPI Constitutional: no fever  Eyes: no drainage  ENT: no runny nose   Cardiovascular:  Palpitations. no chest pain  Resp: SOB and cough GI: no vomiting GU: no dysuria Integumentary: no rash  Allergy: no hives  Musculoskeletal: no leg swelling  Neurological: no  slurred speech ROS otherwise negative  PAST MEDICAL HISTORY/PAST SURGICAL HISTORY:  Past Medical History  Diagnosis Date  . Arthritis   . Back pain   . Bipolar 1 disorder ([place])   . GERD (gastroesophageal reflux disease)   . Diverticulitis   . Vomiting   . COPD (chronic obstructive pulmonary disease) ([place])   . Lung nodules   . Bipolar disorder ([place])   . Schizoaffective disorder, bipolar type ([place])   . Heart disease     "irregular heart beat"  . Anemia   . Hyperlipidemia   . Hypertension   . Family history of adverse reaction to anesthesia     he was adopted  . 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 HOH (hard of hearing)     biological parents are both deaf.   left ear is good.    MEDICATIONS:  Prior to Admission medications   Medication Sig Start Date End Date Taking? Authorizing Provider  albuterol (PROVENTIL HFA;VENTOLIN HFA) 108 (90 BASE) MCG/ACT inhaler Inhale 1-2 puffs into the lungs every 6 (six) hours as needed for wheezing or shortness of breath.    Historical Provider, MD  cyclobenzaprine (FLEXERIL) 10 MG tablet Take 10 mg by mouth 3 (three) times daily as needed for muscle spasms.    Historical Provider, MD  dexlansoprazole (DEXILANT) 60 MG capsule Take 1 capsule (60 mg total) by mouth daily. 07/03/15   Mahala Menghini, PA-C  diclofenac (VOLTAREN) 75  MG EC tablet Take 75 mg by mouth 2 (two) times daily as needed for mild pain.     Historical Provider, MD  FLUoxetine (PROZAC) 40 MG capsule Take 40 mg by mouth daily.    Historical Provider, MD  gabapentin (NEURONTIN) 300 MG capsule Take 300 mg by mouth 3 (three) times daily as needed (for pain).     Historical Provider, MD  lamoTRIgine (LAMICTAL) 25 MG tablet Take 25 mg by mouth 2 (two) times daily.     Historical Provider, MD  metoprolol tartrate (LOPRESSOR) 25 MG tablet Take 0.5 tablets (12.5 mg total) by mouth 2 (two) times daily. 01/27/16   Erin R Barrett, PA-C  Multiple Vitamin  (MULTIVITAMIN) tablet Take 1 tablet by mouth daily.    Historical Provider, MD  naproxen (NAPROSYN) 500 MG tablet Take 1 tablet (500 mg total) by mouth 2 (two) times daily. 01/31/16   Tanna Furry, MD  oxyCODONE-acetaminophen (PERCOCET/ROXICET) 5-325 MG tablet Take 2 tablets by mouth every 4 (four) hours as needed. 01/31/16   Tanna Furry, MD  predniSONE (DELTASONE) 5 MG tablet 10 tabs qd X14 days, 9 tabs qd X14 days, 8 tabs qd X14 days. 02/12/16   Javier Glazier, MD  promethazine (PHENERGAN) 25 MG tablet Take 1 tablet (25 mg total) by mouth every 6 (six) hours as needed for nausea or vomiting. 11/05/15   Dorie Rank, MD  traMADol (ULTRAM) 50 MG tablet Take 1-2 tablets (50-100 mg total) by mouth every 6 (six) hours as needed (mild pain). 01/27/16   Erin R Barrett, PA-C  traZODone (DESYREL) 150 MG tablet Take 300 mg by mouth at bedtime as needed for sleep.     Historical Provider, MD    ALLERGIES:  No Known Allergies  SOCIAL HISTORY:  Social History  Substance Use Topics  . Smoking status: Former Smoker -- 0.75 packs/day for 25 years    Types: Cigars, Cigarettes    Quit date: 07/09/2014  . Smokeless tobacco: Current User    Types: Snuff     Comment: pt uses dip tobacco  . Alcohol Use: 0.0 oz/week    0 Standard drinks or equivalent per week     Comment: "once in awhile" average 1-2 times a week with 1-2 drinks per sitting    FAMILY HISTORY: Family History  Problem Relation Age of Onset  . Adopted: Yes  . Colon cancer Neg Hx     adopted at 56 months old, unsure about GI history of parents   . Alzheimer's disease Other   . Parkinson's disease Other   . Mental illness Other     EXAM: BP 114/73 mmHg  Pulse 93  Temp(Src) 97.7 F (36.5 C) (Oral)  Resp 20  Ht 5\' 11"  (1.803 m)  Wt 188 lb (85.276 kg)  BMI 26.23 kg/m2  SpO2 96% CONSTITUTIONAL: Alert and oriented and responds appropriately to questions. Well-appearing; well-nourished. Smells strongly of EtOH.  HEAD: Normocephalic EYES:  Conjunctivae clear, PERRL ENT: normal nose; no rhinorrhea; moist mucous membranes NECK: Supple, no meningismus, no LAD  CARD: RRR; S1 and S2 appreciated; no murmurs, no clicks, no rubs, no gallops RESP: Normal chest excursion without splinting or tachypnea; breath sounds clear and equal bilaterally; no wheezes, no rhonchi, no rales, no hypoxia or respiratory distress, speaking full sentences ABD/GI: Normal bowel sounds; non-distended; soft, non-tender, no rebound, no guarding, no peritoneal signs BACK:  The back appears normal and is non-tender to palpation, there is no CVA tenderness EXT: Normal ROM in all joints;  non-tender to palpation; no edema; normal capillary refill; no cyanosis, no calf tenderness or swelling    SKIN: Normal color for age and race; warm; no rash NEURO: Moves all extremities equally, sensation to light touch intact diffusely, cranial nerves II through XII intact PSYCH: The patient's mood and manner are appropriate. Grooming and personal hygiene are appropriate.  MEDICAL DECISION MAKING: Patient here with complaints of palpitations and shortness of breath that started around 8:30 to 9 PM tonight while at rest. States he has had shortness of breath for the past year mostly with exertion and was recently diagnosed with "IgG4". States he has had a normal stress test recently. States that his doctor "ruled out" that this was his heart. He denies to me any chest discomfort despite nursing notes. He is hemodynamically stable. Heart rate in the 80s to 90s in a sinus rhythm and oxygen 96% on room air. Lungs are clear to auscultation with good aeration. Reports he is still having some symptoms currently. He does appear slightly intoxicated currently and smells of alcohol. Differential diagnosis includes chronic symptoms from his IgG4 lung disease, ACS, PE, pneumonia, pneumothorax. Will obtain labs, EKG, chest x-ray.   ED PROGRESS: 2:50 AM  Pt reports feeling better. Does have a mild  leukocytosis which is likely from prednisone. Potassium is 3.0, this has been replaced. His bicarbonate is slightly low at 19 has a normal anion gap. We are hydrating the patient. Troponin is negative. D-dimer was mildly elevated but CT of his chest shows no pulmonary embolus. Patient has diffuse nodularity without interval change. No infiltrate, pneumothorax, edema. TSH is also normal. Alcohol level is 163. Urine drug screen negative. Plan is to repeat second troponin at 3 AM which would be 6 hours after the onset of symptoms. If this is negative, I feel he can be discharged home to follow-up with his pulmonologist.   3:30 AM  Pt's second troponin is negative. Again he is not having any chest pain or chest discomfort despite nursing notes. He has not had any hypoxia in the emergency department and his lungs have been clear. I suspect that this is from his chronic illness. I recommended that he call his pulmonologist in the morning and he is comfortable with this plan. No sign of ACS currently, PE, pneumonia, pneumothorax, pulmonary edema. I feel he is safe to be discharged home. He is comfortable with this plan.   At this time, I do not feel there is any life-threatening condition present. I have reviewed and discussed all results (EKG, imaging, lab, urine as appropriate), exam findings with patient. I have reviewed nursing notes and appropriate previous records.  I feel the patient is safe to be discharged home without further emergent workup. Discussed usual and customary return precautions. Patient and family (if present) verbalize understanding and are comfortable with this plan.  Patient will follow-up with their primary care provider. If they do not have a primary care provider, information for follow-up has been provided to them. All questions have been answered.    EKG Interpretation  Date/Time:  Thursday February 19 2016 23:34:26 EDT Ventricular Rate:  85 PR Interval:  163 QRS Duration: 94 QT  Interval:  385 QTC Calculation: 458 R Axis:   -6 Text Interpretation:  Sinus rhythm Left ventricular hypertrophy No significant change since last tracing Confirmed by Nehemiah Montee,  DO, Taci Sterling [phone]) on 02/19/2016 11:38:51 PM        I personally performed the services described in this documentation, which was scribed  in my presence. The recorded information has been reviewed and is accurate.    [place], DO 02/20/16 [phone]

## 2016-02-19 NOTE — Telephone Encounter (Signed)
Pt returned call. Reviewed VS's recs. He voiced understanding and had no further questions. Nothing further needed.

## 2016-02-20 ENCOUNTER — Emergency Department (HOSPITAL_COMMUNITY): Payer: Medicaid Other

## 2016-02-20 LAB — BASIC METABOLIC PANEL
Anion gap: 14 (ref 5–15)
BUN: 22 mg/dL — AB (ref 6–20)
CALCIUM: 8.5 mg/dL — AB (ref 8.9–10.3)
CHLORIDE: 100 mmol/L — AB (ref 101–111)
CO2: 19 mmol/L — AB (ref 22–32)
CREATININE: 1.12 mg/dL (ref 0.61–1.24)
GFR calc non Af Amer: >60 mL/min (ref >60)
GLUCOSE: 181 mg/dL — AB (ref 65–99)
Potassium: 3 mmol/L — ABNORMAL LOW (ref 3.5–5.1)
Sodium: 133 mmol/L — ABNORMAL LOW (ref 135–145)

## 2016-02-20 LAB — CBC WITH DIFFERENTIAL/PLATELET
Basophils Absolute: 0 10*3/uL (ref 0.0–0.1)
Basophils Relative: 0 %
EOS ABS: 0 10*3/uL (ref 0.0–0.7)
EOS PCT: 0 %
HEMATOCRIT: 36.1 % — AB (ref 39.0–52.0)
HEMOGLOBIN: 12.5 g/dL — AB (ref 13.0–17.0)
LYMPHS ABS: 2.3 10*3/uL (ref 0.7–4.0)
Lymphocytes Relative: 21 %
MCH: 31.7 pg (ref 26.0–34.0)
MCHC: 34.6 g/dL (ref 30.0–36.0)
MCV: 91.6 fL (ref 78.0–100.0)
MONO ABS: 0.7 10*3/uL (ref 0.1–1.0)
Monocytes Relative: 6 %
Neutro Abs: 8 10*3/uL — ABNORMAL HIGH (ref 1.7–7.7)
Neutrophils Relative %: 73 %
Platelets: 231 10*3/uL (ref 150–400)
RBC: 3.94 MIL/uL — AB (ref 4.22–5.81)
RDW: 12.9 % (ref 11.5–15.5)
WBC: 11 10*3/uL — AB (ref 4.0–10.5)

## 2016-02-20 LAB — D-DIMER, QUANTITATIVE (NOT AT ARMC): D DIMER QUANT: 0.51 ug{FEU}/mL — AB (ref 0.00–0.50)

## 2016-02-20 LAB — TROPONIN I
Troponin I: <0.03 ng/mL (ref <0.031)
Troponin I: <0.03 ng/mL (ref <0.031)

## 2016-02-20 LAB — RAPID URINE DRUG SCREEN, HOSP PERFORMED
AMPHETAMINES: NOT DETECTED
BARBITURATES: NOT DETECTED
BENZODIAZEPINES: NOT DETECTED
COCAINE: NOT DETECTED
OPIATES: NOT DETECTED
TETRAHYDROCANNABINOL: NOT DETECTED

## 2016-02-20 LAB — TSH: TSH: 0.442 u[IU]/mL (ref 0.350–4.500)

## 2016-02-20 LAB — ETHANOL: Alcohol, Ethyl (B): 163 mg/dL — ABNORMAL HIGH (ref <5)

## 2016-02-20 MED ORDER — POTASSIUM CHLORIDE CRYS ER 20 MEQ PO TBCR
40.0000 meq | EXTENDED_RELEASE_TABLET | Freq: Once | ORAL | Status: AC
  Administered 2016-02-20: 40 meq via ORAL
  Filled 2016-02-20: qty 2

## 2016-02-20 MED ORDER — IOPAMIDOL (ISOVUE-370) INJECTION 76%
100.0000 mL | Freq: Once | INTRAVENOUS | Status: AC | PRN
  Administered 2016-02-20: 100 mL via INTRAVENOUS

## 2016-02-20 NOTE — Discharge Instructions (Signed)
Palpitations A palpitation is the feeling that your heartbeat is irregular or is faster than normal. It may feel like your heart is fluttering or skipping a beat. Palpitations are usually not a serious problem. However, in some cases, you may need further medical evaluation. CAUSES  Palpitations can be caused by:  Smoking.  Caffeine or other stimulants, such as diet pills or energy drinks.  Alcohol.  Stress and anxiety.  Strenuous physical activity.  Fatigue.  Certain medicines.  Heart disease, especially if you have a history of irregular heart rhythms (arrhythmias), such as atrial fibrillation, atrial flutter, or supraventricular tachycardia.  An improperly working pacemaker or defibrillator. DIAGNOSIS  To find the cause of your palpitations, your health care provider will take your medical history and perform a physical exam. Your health care provider may also have you take a test called an ambulatory electrocardiogram (ECG). An ECG records your heartbeat patterns over a 24-hour period. You may also have other tests, such as:  Transthoracic echocardiogram (TTE). During echocardiography, sound waves are used to evaluate how blood flows through your heart.  Transesophageal echocardiogram (TEE).  Cardiac monitoring. This allows your health care provider to monitor your heart rate and rhythm in real time.  Holter monitor. This is a portable device that records your heartbeat and can help diagnose heart arrhythmias. It allows your health care provider to track your heart activity for several days, if needed.  Stress tests by exercise or by giving medicine that makes the heart beat faster. TREATMENT  Treatment of palpitations depends on the cause of your symptoms and can vary greatly. Most cases of palpitations do not require any treatment other than time, relaxation, and monitoring your symptoms. Other causes, such as atrial fibrillation, atrial flutter, or supraventricular  tachycardia, usually require further treatment. HOME CARE INSTRUCTIONS   Avoid:  Caffeinated coffee, tea, soft drinks, diet pills, and energy drinks.  Chocolate.  Alcohol.  Stop smoking if you smoke.  Reduce your stress and anxiety. Things that can help you relax include:  A method of controlling things in your body, such as your heartbeats, with your mind (biofeedback).  Yoga.  Meditation.  Physical activity such as swimming, jogging, or walking.  Get plenty of rest and sleep. SEEK MEDICAL CARE IF:   You continue to have a fast or irregular heartbeat beyond 24 hours.  Your palpitations occur more often. SEEK IMMEDIATE MEDICAL CARE IF:  You have chest pain or shortness of breath.  You have a severe headache.  You feel dizzy or you faint. MAKE SURE YOU:  Understand these instructions.  Will watch your condition.  Will get help right away if you are not doing well or get worse.   This information is not intended to replace advice given to you by your health care provider. Make sure you discuss any questions you have with your health care provider.   Document Released: 10/22/2000 Document Revised: 10/30/2013 Document Reviewed: 12/24/2011 Elsevier Interactive Patient Education 2016 [place] [employer] Shortness of breath means you have trouble breathing. It could also mean that you have a medical problem. You should get immediate medical care for shortness of breath. CAUSES   Not enough oxygen in the air such as with high altitudes or a smoke-filled room.  Certain lung diseases, infections, or problems.  Heart disease or conditions, such as angina or heart failure.  Low red blood cells (anemia).  Poor physical fitness, which can cause shortness of breath when you exercise.  Chest  or back injuries or stiffness.  Being overweight.  Smoking.  Anxiety, which can make you feel like you are not getting enough air. DIAGNOSIS  Serious medical  problems can often be found during your physical exam. Tests may also be done to determine why you are having shortness of breath. Tests may include:  Chest X-rays.  Lung function tests.  Blood tests.  An electrocardiogram (ECG).  An ambulatory electrocardiogram. An ambulatory ECG records your heartbeat patterns over a 24-hour period.  Exercise testing.  A transthoracic echocardiogram (TTE). During echocardiography, sound waves are used to evaluate how blood flows through your heart.  A transesophageal echocardiogram (TEE).  Imaging scans. Your health care provider may not be able to find a cause for your shortness of breath after your exam. In this case, it is important to have a follow-up exam with your health care provider as directed.  TREATMENT  Treatment for shortness of breath depends on the cause of your symptoms and can vary greatly. HOME CARE INSTRUCTIONS   Do not smoke. Smoking is a common cause of shortness of breath. If you smoke, ask for help to quit.  Avoid being around chemicals or things that may bother your breathing, such as paint fumes and dust.  Rest as needed. Slowly resume your usual activities.  If medicines were prescribed, take them as directed for the full length of time directed. This includes oxygen and any inhaled medicines.  Keep all follow-up appointments as directed by your health care provider. SEEK MEDICAL CARE IF:   Your condition does not improve in the time expected.  You have a hard time doing your normal activities even with rest.  You have any new symptoms. SEEK IMMEDIATE MEDICAL CARE IF:   Your shortness of breath gets worse.  You feel light-headed, faint, or develop a cough not controlled with medicines.  You start coughing up blood.  You have pain with breathing.  You have chest pain or pain in your arms, shoulders, or abdomen.  You have a fever.  You are unable to walk up stairs or exercise the way you normally  do. MAKE SURE YOU:  Understand these instructions.  Will watch your condition.  Will get help right away if you are not doing well or get worse.   This information is not intended to replace advice given to you by your health care provider. Make sure you discuss any questions you have with your health care provider.   Document Released: 07/20/2001 Document Revised: 10/30/2013 Document Reviewed: 01/10/2012 Elsevier Interactive Patient Education [employer].

## 2016-02-24 ENCOUNTER — Ambulatory Visit (INDEPENDENT_AMBULATORY_CARE_PROVIDER_SITE_OTHER): Payer: Medicaid Other | Admitting: Nurse Practitioner

## 2016-02-24 ENCOUNTER — Encounter: Payer: Self-pay | Admitting: Nurse Practitioner

## 2016-02-24 VITALS — BP 127/81 | HR 72 | Temp 97.1°F | Ht 71.0 in | Wt 196.0 lb

## 2016-02-24 DIAGNOSIS — R112 Nausea with vomiting, unspecified: Secondary | ICD-10-CM | POA: Diagnosis not present

## 2016-02-24 DIAGNOSIS — R195 Other fecal abnormalities: Secondary | ICD-10-CM

## 2016-02-24 DIAGNOSIS — K2100 Gastro-esophageal reflux disease with esophagitis, without bleeding: Secondary | ICD-10-CM

## 2016-02-24 MED ORDER — ONDANSETRON HCL 4 MG PO TABS: 4.0000 mg | ORAL_TABLET | Freq: Three times a day (TID) | ORAL | Status: DC | PRN

## 2016-02-24 MED ORDER — DICYCLOMINE HCL 10 MG PO CAPS: 10.0000 mg | ORAL_CAPSULE | Freq: Three times a day (TID) | ORAL | Status: DC

## 2016-02-24 NOTE — Assessment & Plan Note (Signed)
Symptoms well controlled on Dexilant. Continue taking Dexilant, return for follow-up in 3 months.

## 2016-02-24 NOTE — Assessment & Plan Note (Signed)
Continues with multiple stools a day that vary between soft and loose. Seems to be worse with prednisone due to newly diagnosed IgG4 lung disease, for which he will be on prednisone for likely 3+ years. Probiotic ineffective. We'll trial Bentyl 3 times a day and at bedtime for abdominal pain and diarrhea/loose stools. Instructed to decrease or stop medication if he becomes constipated. Return for follow-up in 3 months.

## 2016-02-24 NOTE — Progress Notes (Signed)
cc'ed to pcp °

## 2016-02-24 NOTE — Assessment & Plan Note (Signed)
Continued nausea and vomiting, Phenergan ineffective. Symptoms are worse in the morning and occur later in the day, typically after multiple attempts to clear sinuses. Also complains of bloating and early satiety. I will order a gastric emptying study to rule out gastroparesis. Otherwise, likely triggering his own gag reflex due to sinus drainage. May benefit from sinusitis treatment by primary care. Return for follow-up in 3 months.

## 2016-02-24 NOTE — Progress Notes (Signed)
Referring Provider: Soyla Dryer, PA-C Primary Care Physician:  Soyla Dryer, PA-C Primary GI:  Dr. Gala Romney  Chief Complaint  Patient presents with  . Follow-up    HPI:   Shawn [name] is a 45 y.o. male who presents for follow-up on GERD and loose stools. He was last seen in our office 11/26/2015 for the same. At that time there is some confusion related to his medications that she was taking both Dexilant and omeprazole. Nausea noted 3-4 times a week with copious sinus drainage. Was having 2-3 bowel movements a day which seem to be solidifying and improving. Abdominal pain only when he "tries to hold it" when he needs to have a bowel movement. Recommended he stop omeprazole and continue Dexilant as it seems to work best for him. Also recommended probiotic for 30 days to help encourage improvement in bowel movements, follow-up in 3 months.  Today he states he has been diagnosed with a rare lung disease since last being seen (IgG4 lung disease) and seeing pulmonary. Is on prednisone for likely 3 years which is causing worsening stool frequency. Varies between [place] 5-7. Denies hematochezia, melena. Denies unintentional weight loss, fever, chills. GERD is resolved on Dexilant, can tell if he misses a dose. Denies dysphagia. Continues to have N/V regularly despite other GERD symptoms under control with Dexilant. Phenergan does not help much. Worse in the mornings, but does happen during the day, typically after repeated attempts to clear his sinuses. Also complains of early sateity and bloating as well. Denies chest pain, worsening dyspnea, dizziness, lightheadedness, syncope, near syncope. Denies any other upper or lower GI symptoms.   Past Medical History  Diagnosis Date  . Arthritis   . Back pain   . Bipolar 1 disorder ([place])   . GERD (gastroesophageal reflux disease)   . Diverticulitis   . Vomiting   . COPD (chronic obstructive pulmonary disease) ([place])   . Lung nodules   .  Bipolar disorder ([place])   . Schizoaffective disorder, bipolar type ([place])   . Heart disease     "irregular heart beat"  . Anemia   . Hyperlipidemia   . Hypertension   . Family history of adverse reaction to anesthesia     he was adopted  . 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 HOH (hard of hearing)     biological parents are both deaf.   left ear is good.  . 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 Brain surgery  2005    after head injury, patient states had 2 large blood clots evacuated  . Colonoscopy with esophagogastroduodenoscopy (egd) N/A 12/05/2013    [id number] erosive relfux/HH/melanosis coli/colonic diverticulosis. tubulovillous adenoma removed. next tcs 11/2016  . Flexible bronchoscopy N/A 01/23/2016    Procedure: FLEXIBLE BRONCHOSCOPY;  Surgeon: Gaye Pollack, MD;  Location: [facility] OR;  Service: Thoracic;  Laterality: N/A;  . Video assisted thoracoscopy Left 01/23/2016    Procedure: LEFT VIDEO ASSISTED THORACOSCOPY;  Surgeon: Gaye Pollack, MD;  Location: [facility] OR;  Service: Thoracic;  Laterality: Left;  . Lung biopsy N/A 01/23/2016    Procedure: LEFT LUNG BIOPSY;  Surgeon: Gaye Pollack, MD;  Location: [facility] OR;  Service: Thoracic;  Laterality: N/A;    Current Outpatient Prescriptions  Medication Sig Dispense Refill  . albuterol (PROVENTIL HFA;VENTOLIN HFA) 108 (90 BASE) MCG/ACT inhaler Inhale 1-2 puffs into  the lungs every 6 (six) hours as needed for wheezing or shortness of breath.    . cyclobenzaprine (FLEXERIL) 10 MG tablet Take 10 mg by mouth 3 (three) times daily as needed for muscle spasms.    Marland Kitchen dexlansoprazole (DEXILANT) 60 MG capsule Take 1 capsule (60 mg total) by mouth daily. 20 capsule 0  . diclofenac (VOLTAREN) 75 MG EC tablet Take 75 mg by mouth 2 (two) times daily as needed for mild pain.     Marland Kitchen FLUoxetine (PROZAC) 40 MG capsule Take 40 mg by mouth daily.    Marland Kitchen gabapentin (NEURONTIN) 300 MG  capsule Take 300 mg by mouth 3 (three) times daily as needed (for pain).     Marland Kitchen lamoTRIgine (LAMICTAL) 25 MG tablet Take 25 mg by mouth 2 (two) times daily.     . metoprolol tartrate (LOPRESSOR) 25 MG tablet Take 0.5 tablets (12.5 mg total) by mouth 2 (two) times daily.    . Multiple Vitamin (MULTIVITAMIN) tablet Take 1 tablet by mouth daily.    . naproxen (NAPROSYN) 500 MG tablet Take 1 tablet (500 mg total) by mouth 2 (two) times daily. 30 tablet 0  . oxyCODONE-acetaminophen (PERCOCET/ROXICET) 5-325 MG tablet Take 2 tablets by mouth every 4 (four) hours as needed. 6 tablet 0  . predniSONE (DELTASONE) 5 MG tablet 10 tabs qd X14 days, 9 tabs qd X14 days, 8 tabs qd X14 days. 378 tablet 0  . traMADol (ULTRAM) 50 MG tablet Take 1-2 tablets (50-100 mg total) by mouth every 6 (six) hours as needed (mild pain). 30 tablet 0  . traZODone (DESYREL) 150 MG tablet Take 300 mg by mouth at bedtime as needed for sleep.     Marland Kitchen dicyclomine (BENTYL) 10 MG capsule Take 1 capsule (10 mg total) by mouth 4 (four) times daily -  before meals and at bedtime. As needed for loose stools and abdominal pain 120 capsule 2  . ondansetron (ZOFRAN) 4 MG tablet Take 1 tablet (4 mg total) by mouth every 8 (eight) hours as needed for nausea or vomiting. 30 tablet 1   No current facility-administered medications for this visit.    Allergies as of 02/24/2016  . (No Known Allergies)    Family History  Problem Relation Age of Onset  . Adopted: Yes  . Colon cancer Neg Hx     adopted at 17 months old, unsure about GI history of parents   . Alzheimer's disease Other   . Parkinson's disease Other   . Mental illness Other     Social History   Social History  . Marital Status: Divorced    Spouse Name: N/A  . Number of Children: 1  . Years of Education: N/A   Occupational History  . disabled    Social History Main Topics  . Smoking status: Former Smoker -- 0.75 packs/day for 25 years    Types: Cigars, Cigarettes    Quit  date: 07/09/2014  . Smokeless tobacco: Current User    Types: Snuff     Comment: pt uses dip tobacco  . Alcohol Use: 0.0 oz/week    0 Standard drinks or equivalent per week     Comment: "once in awhile" average 1-2 times a week with 1-2 drinks per sitting  . Drug Use: No  . Sexual Activity: Not Asked   Other Topics Concern  . None   Social History Narrative   He is originally from [place]. 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 Has always lived in [place] &  [place]. Previously have traveled to [place], [place], [place], [place], [place]. Remote travel to [place]. 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 Lived in a house with mold around 2001-2002 as well as 2006. Has 2 dogs currently. No bird exposure. No hot tub exposure. Mainly walks for fun. He was incarcerated for 2 years previously. No known TB exposure. Had previous negative PPDs last around 2010. Never lived in a homeless shelter but has eaten at them before.     Review of Systems: General: Negative for anorexia, weight loss, fever, chills, fatigue, weakness. ENT: Negative for hoarseness, difficulty swallowing. CV: Negative for chest pain, angina, palpitations, peripheral edema.  Respiratory: Negative for dyspnea at rest, cough, sputum, wheezing.  GI: See history of present illness. Endo: Negative for unusual weight change.  Heme: Negative for bruising or bleeding.   Physical Exam: BP 127/81 mmHg  Pulse 72  Temp(Src) 97.1 F (36.2 C) (Oral)  Ht 5\' 11"  (1.803 m)  Wt 196 lb (88.905 kg)  BMI 27.35 kg/m2 General:   Alert and oriented. Pleasant and cooperative. Well-nourished and well-developed.  Head:  Normocephalic and atraumatic. Eyes:  Without icterus, sclera clear and conjunctiva pink.  Ears:  Normal auditory acuity. Cardiovascular:  S1, S2 present without murmurs appreciated. Extremities without clubbing or edema. Respiratory:  Clear to auscultation bilaterally. No wheezes, rales, or rhonchi. No distress.  Gastrointestinal:   +BS, rounded but soft, non-tender and non-distended. No HSM noted. No guarding or rebound. No masses appreciated.  Rectal:  Deferred  Neurologic:  Alert and oriented x4;  grossly normal neurologically. Psych:  Alert and cooperative. Normal mood and affect. Heme/Lymph/Immune: No excessive bruising noted.    02/24/2016 10:48 AM   Disclaimer: This note was dictated with voice recognition software. Similar sounding words can inadvertently be transcribed and may not be corrected upon review.

## 2016-02-24 NOTE — Patient Instructions (Signed)
1. Continue taking Dexilant for your acid reflux. 2. Stop taking Phenergan for nausea. 3. I sent in a prescription for Zofran to your pharmacy. You can take this every 8 hours as needed for nausea/vomiting. 4. I also sent in a prescription for Bentyl 10 mg. You can take 3 times a day with meals and at bedtime as needed for loose stools and abdominal pain. If you become constipated, decrease the dose or stop taking. 5. We will schedule your x-ray to evaluate her stomach emptying for you. 6. Return for follow-up in 3 months.

## 2016-02-25 ENCOUNTER — Telehealth: Payer: Self-pay | Admitting: Pulmonary Disease

## 2016-02-25 NOTE — Telephone Encounter (Signed)
Patient states that he has been taking Prednisone x 2 weeks and he has not been able to produce sperm since starting Prednisone.  Patient states that he is supposed to be on the Prednisone for 3 years or more.  Patient states that he is still experiencing a lot of diarrhea and says that his buttocks are raw.   [facility] [place]  No Known Allergies

## 2016-02-26 ENCOUNTER — Encounter (HOSPITAL_COMMUNITY)
Admission: RE | Admit: 2016-02-26 | Discharge: 2016-02-26 | Disposition: A | Discharge status: Home / Self Care | Payer: Medicaid Other | Source: Ambulatory Visit | Attending: Nurse Practitioner | Admitting: Nurse Practitioner

## 2016-02-26 ENCOUNTER — Encounter (HOSPITAL_COMMUNITY): Payer: Self-pay

## 2016-02-26 DIAGNOSIS — R6881 Early satiety: Secondary | ICD-10-CM | POA: Insufficient documentation

## 2016-02-26 DIAGNOSIS — R140 Abdominal distension (gaseous): Secondary | ICD-10-CM | POA: Insufficient documentation

## 2016-02-26 DIAGNOSIS — R195 Other fecal abnormalities: Secondary | ICD-10-CM | POA: Diagnosis present

## 2016-02-26 DIAGNOSIS — K2100 Gastro-esophageal reflux disease with esophagitis, without bleeding: Secondary | ICD-10-CM

## 2016-02-26 DIAGNOSIS — R112 Nausea with vomiting, unspecified: Secondary | ICD-10-CM | POA: Diagnosis present

## 2016-02-26 MED ORDER — TECHNETIUM TC 99M SULFUR COLLOID
2.0000 | Freq: Once | INTRAVENOUS | Status: AC | PRN
Start: 2016-02-26 — End: 2016-02-26
  Administered 2016-02-26: 2.1 via ORAL

## 2016-02-26 NOTE — Telephone Encounter (Signed)
I'm assuming that the message meant to say "sputum" and not "sperm". I would not expect diarrhea as a side effect of Prednisone therapy. It is possible there is some other process ongoing as well. He should contact his PCP and get evaluated if he is having that much diarrhea. I'm seeing him next week for the dyspnea and ED visit from last week. He should have the diarrhea addressed by his other physician.

## 2016-02-26 NOTE — Telephone Encounter (Signed)
Spoke with pt and advised of Dr Ammie Dalton advice regarding symptoms.  Pt verbalized understanding and will contact PCP and keep appt with Dr Ashok Cordia on 03/02/16.

## 2016-02-26 NOTE — Telephone Encounter (Signed)
LVM for pt to return call

## 2016-03-01 ENCOUNTER — Other Ambulatory Visit (HOSPITAL_COMMUNITY): Payer: Self-pay | Admitting: Family

## 2016-03-01 DIAGNOSIS — M545 Low back pain: Secondary | ICD-10-CM

## 2016-03-01 DIAGNOSIS — M542 Cervicalgia: Secondary | ICD-10-CM

## 2016-03-02 ENCOUNTER — Ambulatory Visit (INDEPENDENT_AMBULATORY_CARE_PROVIDER_SITE_OTHER): Payer: Medicaid Other | Admitting: Pulmonary Disease

## 2016-03-02 ENCOUNTER — Encounter: Payer: Self-pay | Admitting: Pulmonary Disease

## 2016-03-02 VITALS — BP 134/76 | HR 112 | Ht 71.0 in | Wt 198.0 lb

## 2016-03-02 DIAGNOSIS — R0600 Dyspnea, unspecified: Secondary | ICD-10-CM

## 2016-03-02 DIAGNOSIS — D8989 Other specified disorders involving the immune mechanism, not elsewhere classified: Secondary | ICD-10-CM | POA: Diagnosis not present

## 2016-03-02 NOTE — Progress Notes (Signed)
Subjective:    Patient ID: Shawn [name], male    DOB: 05-03-1971, 45 y.o.   MRN: [id number]  [facility].:  Acute visit for dyspnea with known IgG4 Lung Disease.  HPI  Dyspnea:  Patient evaluated in the emergency department. UDS was negative. Alcohol level was elevated 163. D-dimer was elevated but CTA of the chest was negative for pulmonary embolism. He reports that his dyspnea is no better but doesn't seem to be getting worse either.   IgG4 Lung Disease:  Identified on VATS biopsy. Started on prednisone at last appointment. Patient's IgG subclass was not elevated for IgG subclass IV.  Review of Systems No fever or chills. He does have sweats. No chest pain or pressure. He does have nausea with emesis that has been ongoing for years. He reports his diarrhea seems to be a bit worse and more liquid. He has noticed a slight numbness in the back of his right leg. Patient has noted inability to ejaculate but with preserved erectile function.  No Known Allergies  Current Outpatient Prescriptions on File Prior to Visit  Medication Sig Dispense Refill  . albuterol (PROVENTIL HFA;VENTOLIN HFA) 108 (90 BASE) MCG/ACT inhaler Inhale 1-2 puffs into the lungs every 6 (six) hours as needed for wheezing or shortness of breath.    . cyclobenzaprine (FLEXERIL) 10 MG tablet Take 10 mg by mouth 3 (three) times daily as needed for muscle spasms.    Marland Kitchen dexlansoprazole (DEXILANT) 60 MG capsule Take 1 capsule (60 mg total) by mouth daily. 20 capsule 0  . diclofenac (VOLTAREN) 75 MG EC tablet Take 75 mg by mouth 2 (two) times daily as needed for mild pain.     Marland Kitchen dicyclomine (BENTYL) 10 MG capsule Take 1 capsule (10 mg total) by mouth 4 (four) times daily -  before meals and at bedtime. As needed for loose stools and abdominal pain 120 capsule 2  . FLUoxetine (PROZAC) 40 MG capsule Take 40 mg by mouth daily.    Marland Kitchen gabapentin (NEURONTIN) 300 MG capsule Take 300 mg by mouth 3 (three) times daily as needed (for pain).     Marland Kitchen  lamoTRIgine (LAMICTAL) 25 MG tablet Take 25 mg by mouth 2 (two) times daily.     . metoprolol tartrate (LOPRESSOR) 25 MG tablet Take 0.5 tablets (12.5 mg total) by mouth 2 (two) times daily.    . Multiple Vitamin (MULTIVITAMIN) tablet Take 1 tablet by mouth daily.    . naproxen (NAPROSYN) 500 MG tablet Take 1 tablet (500 mg total) by mouth 2 (two) times daily. 30 tablet 0  . ondansetron (ZOFRAN) 4 MG tablet Take 1 tablet (4 mg total) by mouth every 8 (eight) hours as needed for nausea or vomiting. 30 tablet 1  . oxyCODONE-acetaminophen (PERCOCET/ROXICET) 5-325 MG tablet Take 2 tablets by mouth every 4 (four) hours as needed. 6 tablet 0  . predniSONE (DELTASONE) 5 MG tablet 10 tabs qd X14 days, 9 tabs qd X14 days, 8 tabs qd X14 days. 378 tablet 0  . traMADol (ULTRAM) 50 MG tablet Take 1-2 tablets (50-100 mg total) by mouth every 6 (six) hours as needed (mild pain). 30 tablet 0  . traZODone (DESYREL) 150 MG tablet Take 300 mg by mouth at bedtime as needed for sleep.      No current facility-administered medications on file prior to visit.    Past Medical History  Diagnosis Date  . Arthritis   . Back pain   . Bipolar 1 disorder ([place])   .  GERD (gastroesophageal reflux disease)   . Diverticulitis   . Vomiting   . COPD (chronic obstructive pulmonary disease) ([place])   . Lung nodules   . Bipolar disorder ([place])   . Schizoaffective disorder, bipolar type ([place])   . Heart disease     "irregular heart beat"  . Anemia   . Hyperlipidemia   . Hypertension   . Family history of adverse reaction to anesthesia     he was adopted  . 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 HOH (hard of hearing)     biological parents are both deaf.   left ear is good.  . 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 Brain surgery  2005    after head injury, patient states had 2 large blood clots evacuated  . Colonoscopy with  esophagogastroduodenoscopy (egd) N/A 12/05/2013    [id number] erosive relfux/HH/melanosis coli/colonic diverticulosis. tubulovillous adenoma removed. next tcs 11/2016  . Flexible bronchoscopy N/A 01/23/2016    Procedure: FLEXIBLE BRONCHOSCOPY;  Surgeon: Gaye Pollack, MD;  Location: [facility] OR;  Service: Thoracic;  Laterality: N/A;  . Video assisted thoracoscopy Left 01/23/2016    Procedure: LEFT VIDEO ASSISTED THORACOSCOPY;  Surgeon: Gaye Pollack, MD;  Location: [facility] OR;  Service: Thoracic;  Laterality: Left;  . Lung biopsy N/A 01/23/2016    Procedure: LEFT LUNG BIOPSY;  Surgeon: Gaye Pollack, MD;  Location: [facility] OR;  Service: Thoracic;  Laterality: N/A;    Family History  Problem Relation Age of Onset  . Adopted: Yes  . Colon cancer Neg Hx     adopted at 91 months old, unsure about GI history of parents   . Alzheimer's disease Other   . Parkinson's disease Other   . Mental illness Other     Social History   Social History  . Marital Status: Divorced    Spouse Name: N/A  . Number of Children: 1  . Years of Education: N/A   Occupational History  . disabled    Social History Main Topics  . Smoking status: Former Smoker -- 0.75 packs/day for 25 years    Types: Cigars, Cigarettes    Quit date: 07/09/2014  . Smokeless tobacco: Current User    Types: Snuff     Comment: pt uses dip tobacco  . Alcohol Use: 0.0 oz/week    0 Standard drinks or equivalent per week     Comment: "once in awhile" average 1-2 times a week with 1-2 drinks per sitting  . Drug Use: No  . Sexual Activity: Not Asked   Other Topics Concern  . None   Social History Narrative   He is originally from [place]. 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 Has always lived in [place]. Previously have traveled to [place], [place], [place], [place], [place]. Remote travel to [place]. 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 Lived in a house with mold around 2001-2002 as well as 2006. Has 2 dogs currently. No bird exposure. No hot  tub exposure. Mainly walks for fun. He was incarcerated for 2 years previously. No known TB exposure. Had previous negative PPDs last around 2010. Never lived in a homeless shelter but has eaten at them before.       Objective:   Physical Exam BP 134/76 mmHg  Pulse 112  Ht 5\' 11"  (1.803 m)  Wt 198 lb (89.812  kg)  BMI 27.63 kg/m2  SpO2 95% General:  Awake. Alert. Doristine Bosworth with him today. No distress. Integument:  Warm & dry. No rash on exposed skin.  Lymphatics:  No appreciated cervical or supraclavicular lymphadenoapthy. HEENT:  Moist mucus membranes. No oral ulcers. No nasal turbinate swelling. Cardiovascular:  Regular rate. No edema. Normal S1 & S2. Pulmonary:  Clear to auscultation bilaterally. Speaking complete sentences. Mildly increased work of breathing with simple walking. Musculoskeletal:  No joint effusion or deformity appreciated. Normal bulk and tone.  PFT 01/06/16: FVC 4.66 L (87%) FEV1 3.86 L (92%) FEV1/FVC 0.83 FEF 25-75 4.78 L (125%) no bronchodilator response TLC 6.74 L (94%) RV 66% ERV 23% DLCO uncorrected 103%  CPET (01/14/16): Normal functional capacity with no clear ventilatory or circulatory limitations to exercise.  6MWT 01/06/16:  Walked 528 meters / Baseline Sat 97% on RA / Nadir 97% on RA @ rest (c/o pain in right rib cage, clammy sensation & tightness in chest)  IMAGING CTA CHEST 02/20/16 (per radiologist): Negative for pulmonary embolism. Diffuse nodularity persists. Small hiatal hernia.  CT CHEST W/O 10/20/15 (previously reviewed by me): Pulmonary nodularity diffusely in bronchovascular distribution width 1.4 cm groundglass nodule left upper lobe. Additional subcentimeter groundglass nodules noted.  Cystic changes noted bilaterally with largest within right lower lobe. Subcentimeter mediastinal lymph nodes without pathologic enlargement. No pleural effusion or thickening. No pericardial effusion. No esophageal dilation or mucosal thickening. Nodules appear  essentially unchanged when compared with CT scan from 2015. There do appear to be more cystic areas within the lungs on my review.  PATHOLOGY LUL WEDGE RESECTION (01/23/16): Diffuse lymphoid hyperplasia with plasmacytosis and increased IgG4 expression. No evidence of malignancy.  LABS 02/12/16 IgG subclass 1: 931 IgG subclass 2: 381 IgG subclass 3: 84 IgG subclass 4: 58.4  12/22/15 Alpha-1 antitrypsin: MM (136) ANA: Negative Rheumatoid factor: 10 Anti-CCP:  <16 SSA:  <1 SSB:  <1 Quantiferon TB:  Negative (PPD negative) HIV:  Negative   11/05/15 CBC: 10.4/15.3/44.1/250 BMP: 141/4.7/103/28/21/1.46/108/9.7 LFT: 4.2/7.6/0.5/45/28/44    Assessment & Plan:  45 year old Caucasian male with bilateral pulmonary nodules as well as a few cysts within his lungs quite probably secondary to underlying IgG4 related lung disease. However, his IgG4 subclass was normal. I do question whether or not his pathology interpretation may be adequate. With lack of response to prednisone therapy we will continue as prescribed. I may have to get a second opinion on the patient's surgical lung biopsy before proceeding with further immunosuppressive therapies. I relayed the results of his serum testing from last appointment. We also discussed his ejaculatory dysfunction which is likely secondary to medication effect. I instructed the patient to contact my office if he had any new breathing problems before his next appointment.  1. Dyspnea: Persists despite prednisone therapy. Likely related to his underlying lung disease. 2. IgG4 Related Lung Disease: Started on Prednisone at 50 mg by mouth daily and tapering by 5 mg every 2 weeks at last appointment. Scheduled for repeat spirometry with DLCO and 6 minute walk test on room air at next appointment. Consider obtaining a second opinion on the patient's surgical lung biopsy if there is not significant improvement in his breathing or pulmonary function at next  appointment. 3. Ejaculatory dysfunction: Likely secondary to medication effect. Advised him to address this medication with his psychiatrist. 4. Health Maintenance: Pneumovax April 2017 & Tdap December 2015. 5. Follow-up: Patient will keep his previously scheduled follow-up appointments.   Sonia Baller Ashok Cordia, M.D. [facility] Pulmonary &  Critical Care Pager:  [phone] After 3pm or if no response, call [phone] 3:08 PM 03/02/2016

## 2016-03-02 NOTE — Patient Instructions (Signed)
   Keep taking your Prednisone as prescribed.  We are going to keep you follow-up appointments the same.  Call me if you have any new problems before your next appointment.

## 2016-03-08 ENCOUNTER — Ambulatory Visit (HOSPITAL_COMMUNITY)
Admission: RE | Admit: 2016-03-08 | Discharge: 2016-03-08 | Disposition: A | Discharge status: Home / Self Care | Payer: Medicaid Other | Source: Ambulatory Visit | Attending: Family | Admitting: Family

## 2016-03-08 ENCOUNTER — Other Ambulatory Visit (HOSPITAL_COMMUNITY): Payer: Self-pay | Admitting: Family

## 2016-03-08 DIAGNOSIS — M545 Low back pain: Secondary | ICD-10-CM | POA: Insufficient documentation

## 2016-03-08 DIAGNOSIS — M542 Cervicalgia: Secondary | ICD-10-CM | POA: Diagnosis present

## 2016-03-08 DIAGNOSIS — S0910XA Unspecified injury of muscle and tendon of head, initial encounter: Secondary | ICD-10-CM | POA: Diagnosis present

## 2016-03-08 DIAGNOSIS — M479 Spondylosis, unspecified: Secondary | ICD-10-CM | POA: Diagnosis not present

## 2016-03-08 DIAGNOSIS — M4802 Spinal stenosis, cervical region: Secondary | ICD-10-CM | POA: Diagnosis not present

## 2016-03-08 DIAGNOSIS — S0095XA Superficial foreign body of unspecified part of head, initial encounter: Secondary | ICD-10-CM

## 2016-03-08 DIAGNOSIS — M5126 Other intervertebral disc displacement, lumbar region: Secondary | ICD-10-CM | POA: Diagnosis not present

## 2016-03-17 ENCOUNTER — Ambulatory Visit (HOSPITAL_COMMUNITY)
Admission: RE | Admit: 2016-03-17 | Discharge: 2016-03-17 | Disposition: A | Discharge status: Home / Self Care | Payer: Medicaid Other | Source: Ambulatory Visit | Attending: Pulmonary Disease | Admitting: Pulmonary Disease

## 2016-03-17 DIAGNOSIS — D8989 Other specified disorders involving the immune mechanism, not elsewhere classified: Secondary | ICD-10-CM | POA: Insufficient documentation

## 2016-03-17 LAB — PULMONARY FUNCTION TEST
DL/VA % PRED: 111 %
DL/VA: 5.25 ml/min/mmHg/L
DLCO UNC: 28.81 ml/min/mmHg
DLCO unc % pred: 85 %
FEF 25-75 Pre: 3.59 L/sec
FEF2575-%Pred-Pre: 94 %
FEV1-%Pred-Pre: 79 %
FEV1-PRE: 3.33 L
FEV1FVC-%Pred-Pre: 102 %
FEV6-%Pred-Pre: 78 %
FEV6-PRE: 4.08 L
FEV6FVC-%Pred-Pre: 102 %
FVC-%PRED-PRE: 77 %
FVC-PRE: 4.12 L
PRE FEV6/FVC RATIO: 99 %
Pre FEV1/FVC ratio: 81 %

## 2016-03-19 ENCOUNTER — Ambulatory Visit (INDEPENDENT_AMBULATORY_CARE_PROVIDER_SITE_OTHER): Payer: Medicaid Other | Admitting: Pulmonary Disease

## 2016-03-19 ENCOUNTER — Encounter: Payer: Self-pay | Admitting: Pulmonary Disease

## 2016-03-19 VITALS — BP 128/80 | HR 73 | Ht 71.0 in | Wt 191.8 lb

## 2016-03-19 DIAGNOSIS — D8989 Other specified disorders involving the immune mechanism, not elsewhere classified: Secondary | ICD-10-CM | POA: Diagnosis not present

## 2016-03-19 DIAGNOSIS — R0600 Dyspnea, unspecified: Secondary | ICD-10-CM

## 2016-03-19 DIAGNOSIS — R0689 Other abnormalities of breathing: Secondary | ICD-10-CM

## 2016-03-19 MED ORDER — PREDNISONE 5 MG PO TABS: ORAL_TABLET | ORAL | Status: DC

## 2016-03-19 NOTE — Progress Notes (Signed)
Test reviewed.  

## 2016-03-19 NOTE — Progress Notes (Signed)
Subjective:    Patient ID: Shawn [name], male    DOB: 1971-07-25, 45 y.o.   MRN: [id number]  C.C.:  Follow-up for Dyspnea & IgG4/Nodular Lung Disease.  HPI  Dyspnea:  Reports his dyspnea is no better & no worse. Reports infrequent cough & wheezing.   IgG4/Nodular Lung Disease:  Identified on VATS biopsy. Started on prednisone previously and has weaned to 40mg  daily now. Patient's IgG subclass was not elevated for IgG subclass IV.  Review of Systems Denies any chest tightness or pain. No fever, chills, or sweats. No new rashes or bruising.   No Known Allergies  Current Outpatient Prescriptions on File Prior to Visit  Medication Sig Dispense Refill  . albuterol (PROVENTIL HFA;VENTOLIN HFA) 108 (90 BASE) MCG/ACT inhaler Inhale 1-2 puffs into the lungs every 6 (six) hours as needed for wheezing or shortness of breath.    . cyclobenzaprine (FLEXERIL) 10 MG tablet Take 10 mg by mouth 3 (three) times daily as needed for muscle spasms.    Marland Kitchen dexlansoprazole (DEXILANT) 60 MG capsule Take 1 capsule (60 mg total) by mouth daily. 20 capsule 0  . diclofenac (VOLTAREN) 75 MG EC tablet Take 75 mg by mouth 2 (two) times daily as needed for mild pain.     Marland Kitchen dicyclomine (BENTYL) 10 MG capsule Take 1 capsule (10 mg total) by mouth 4 (four) times daily -  before meals and at bedtime. As needed for loose stools and abdominal pain 120 capsule 2  . FLUoxetine (PROZAC) 40 MG capsule Take 40 mg by mouth daily.    Marland Kitchen gabapentin (NEURONTIN) 300 MG capsule Take 300 mg by mouth 3 (three) times daily as needed (for pain).     . Iloperidone (FANAPT) 2 MG TABS Take 1 tablet by mouth 2 (two) times daily.    Marland Kitchen lamoTRIgine (LAMICTAL) 25 MG tablet Take 25 mg by mouth 2 (two) times daily.     . metoprolol tartrate (LOPRESSOR) 25 MG tablet Take 0.5 tablets (12.5 mg total) by mouth 2 (two) times daily.    . Multiple Vitamin (MULTIVITAMIN) tablet Take 1 tablet by mouth daily.    . naproxen (NAPROSYN) 500 MG tablet Take 1  tablet (500 mg total) by mouth 2 (two) times daily. 30 tablet 0  . ondansetron (ZOFRAN) 4 MG tablet Take 1 tablet (4 mg total) by mouth every 8 (eight) hours as needed for nausea or vomiting. 30 tablet 1  . oxyCODONE-acetaminophen (PERCOCET/ROXICET) 5-325 MG tablet Take 2 tablets by mouth every 4 (four) hours as needed. 6 tablet 0  . traMADol (ULTRAM) 50 MG tablet Take 1-2 tablets (50-100 mg total) by mouth every 6 (six) hours as needed (mild pain). 30 tablet 0  . traZODone (DESYREL) 150 MG tablet Take 300 mg by mouth at bedtime as needed for sleep.      No current facility-administered medications on file prior to visit.    Past Medical History  Diagnosis Date  . Arthritis   . Back pain   . Bipolar 1 disorder ([place])   . GERD (gastroesophageal reflux disease)   . Diverticulitis   . Vomiting   . COPD (chronic obstructive pulmonary disease) ([place])   . Lung nodules   . Bipolar disorder ([place])   . Schizoaffective disorder, bipolar type ([place])   . Heart disease     "irregular heart beat"  . Anemia   . Hyperlipidemia   . Hypertension   . Family history of adverse reaction to anesthesia  he was adopted  . 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 HOH (hard of hearing)     biological parents are both deaf.   left ear is good.  . 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 Brain surgery  2005    after head injury, patient states had 2 large blood clots evacuated  . Colonoscopy with esophagogastroduodenoscopy (egd) N/A 12/05/2013    [id number] erosive relfux/HH/melanosis coli/colonic diverticulosis. tubulovillous adenoma removed. next tcs 11/2016  . Flexible bronchoscopy N/A 01/23/2016    Procedure: FLEXIBLE BRONCHOSCOPY;  Surgeon: Shawn Pollack, MD;  Location[facility] OR;  Service: Thoracic;  Laterality: N/A;  . Video assisted thoracoscopy Left 01/23/2016    Procedure: LEFT VIDEO ASSISTED THORACOSCOPY;  Surgeon: Shawn Pollack, MD;  Location: [facility] OR;  Service: Thoracic;  Laterality: Left;  . Lung biopsy N/A 01/23/2016    Procedure: LEFT LUNG BIOPSY;  Surgeon: Shawn Pollack, MD;  Location: [facility] OR;  Service: Thoracic;  Laterality: N/A;    Family History  Problem Relation Age of Onset  . Adopted: Yes  . Colon cancer Neg Hx     adopted at 48 months old, unsure about GI history of parents   . Alzheimer's disease Other   . Parkinson's disease Other   . Mental illness Other     Social History   Social History  . Marital Status: Divorced    Spouse Name: N/A  . Number of Children: 1  . Years of Education: N/A   Occupational History  . disabled    Social History Main Topics  . Smoking status: Former Smoker -- 0.75 packs/day for 25 years    Types: Cigars, Cigarettes    Quit date: 07/09/2014  . Smokeless tobacco: Current User    Types: Snuff     Comment: pt uses dip tobacco  . Alcohol Use: 0.0 oz/week    0 Standard drinks or equivalent per week     Comment: "once in awhile" average 1-2 times a week with 1-2 drinks per sitting  . Drug Use: No  . Sexual Activity: Not Asked   Other Topics Concern  . None   Social History Narrative   He is originally from [place]. 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 Has always lived in [place]. Previously have traveled to [place], [place], [place], [place], [place]. Remote travel to [place]. 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 Lived in a house with mold around 2001-2002 as well as 2006. Has 2 dogs currently. No bird exposure. No hot tub exposure. Mainly walks for fun. He was incarcerated for 2 years previously. No known TB exposure. Had previous negative PPDs last around 2010. Never lived in a homeless shelter but has eaten at them before.       Objective:   Physical Exam BP 128/80 mmHg  Pulse 73  Ht 5\' 11"  (1.803 m)  Wt 191 lb 12.8 oz (87 kg)  BMI 26.76 kg/m2  SpO2 98% General:  Awake. Alert. Mother & father with him today. Integument:  Warm & dry. No  rash on exposed skin.  Lymphatics:  No appreciated cervical or supraclavicular lymphadenoapthy. HEENT:  Moist mucus membranes. No oral ulcers. No scleral icterus. Cardiovascular:  Regular rate. No edema. Normal S1 & S2. Abdomen: Soft. Protuberant. Normal bowel sounds. Pulmonary:  Remains clear to auscultation. Normal work of breathing on  room air. Speaking in complete sentences. Musculoskeletal:  No joint effusion or deformity appreciated. Normal bulk and tone.  PFT 03/17/16: FVC 4.12 L (77%) FEV1 3.33 L (79%) FEV1/FVC 0.81 FEF 25-75 3.59 L (94%)                                                                                                                       DLCO uncorrected 85% (Hgb 14.6)  01/06/16: FVC 4.66 L (87%) FEV1 3.86 L (92%) FEV1/FVC 0.83 FEF 25-75 4.78 L (125%) no bronchodilator response TLC 6.74 L (94%) RV 66% ERV 23% DLCO uncorrected 103%  CPET (01/14/16): Normal functional capacity with no clear ventilatory or circulatory limitations to exercise.  6MWT 03/20/13:  Walked 474 meters / Baseline Sat 99% on RA / Nadir Sat 99% on RA (c/o "dizziness") 01/06/16:  Walked 528 meters / Baseline Sat 97% on RA / Nadir 97% on RA @ rest (c/o pain in right rib cage, clammy sensation & tightness in chest)  IMAGING CTA CHEST 02/20/16 (per radiologist): Negative for pulmonary embolism. Diffuse nodularity persists. Small hiatal hernia.  CT CHEST W/O 10/20/15 (previously reviewed by me): Pulmonary nodularity diffusely in bronchovascular distribution width 1.4 cm groundglass nodule left upper lobe. Additional subcentimeter groundglass nodules noted.  Cystic changes noted bilaterally with largest within right lower lobe. Subcentimeter mediastinal lymph nodes without pathologic enlargement. No pleural effusion or thickening. No pericardial effusion. No esophageal dilation or mucosal thickening. Nodules appear essentially unchanged when compared with CT scan from 2015. There do appear to be more cystic  areas within the lungs on my review.  PATHOLOGY [facility]EDGE RESECTION (01/23/16): Diffuse lymphoid hyperplasia with plasmacytosis and increased IgG4 expression. No evidence of malignancy.  LABS 02/12/16 IgG subclass 1: 931 (382-929) IgG subclass 2: 381 IgG subclass 3: 84 IgG subclass 4: 58.4 (4-86.0)  12/22/15 Alpha-1 antitrypsin: MM (136) ANA: Negative Rheumatoid factor: 10 Anti-CCP:  <16 SSA:  <1 SSB:  <1 Quantiferon TB:  Negative (PPD negative) HIV:  Negative   11/05/15 CBC: 10.4/15.3/44.1/250 BMP: 141/4.7/103/28/21/1.46/108/9.7 LFT: 4.2/7.6/0.5/45/28/44    Assessment & Plan:  45 year old Caucasian male with IgG4 related lung disease/nodular lung disease. Patient continues to have ongoing dyspnea. I reviewed his spirometry from today which does show significant decline and could be related to his increasing abdominal girth. His carbon monoxide diffusion capacity uncorrected for hemoglobin has decreased somewhat as well. His 6 minute walk test distance has decreased some but he still has no significant desaturation. With lack of symptomatic benefit or improvement in his home on a function testing/walk test did not believe the continuing immunosuppression with prednisone at this time will be of great benefit to the patient. As such, I will be tapering him off prednisone. I did caution the patient and his family that if he were to experience any increased breathing problems or new symptoms as his prednisone was tapered patient notify me immediately. With his normal IgG4 subclass on serum testing I'm somewhat skeptical about the pathology report from his surgical  lung biopsy. As such, before initiating further immunosuppression or treatment I will be obtaining a second pathology review from [facility]. I also advised the patient that I may have him evaluated by a rare lung disease expert at [facility] depending upon their findings and/or recommendations.  1. Dyspnea: Likely  related to his underlying lung disease. Repeat spirometry with DLCO and 6 minute walk test at next appointment. 2. IgG4 Related/Nodular Lung Disease: Weaning prednisone by 10 mg every 7 days until he will completely stop prednisone therapy. Requesting second opinion/reviewed by [facility] pathology department. I will consider referral to [facility] pulmonary depending upon this result. Repeating spirometry with DLCO and 6 minute walk test at next appointment. 3. Health Maintenance: Pneumovax April 2017 & Tdap December 2015. 4. Follow-up: Return to clinic in 1-2 months or sooner if needed.  Sonia Baller Ashok Cordia, M.D. [facility] Pager:  [phone] After 3pm or if no response, call [phone] 11:02 AM 03/19/2016

## 2016-03-19 NOTE — Patient Instructions (Signed)
   Call me if your breathing begins to worsen or you experience any new problems before your next appointment as we wean your Prednisone  We will contact you once we've had [facility] Pathology review your lung biopsy result  Prednisone Taper:  Take 6 tablets (30mg ) daily for 7 days then  Take 4 tablets (20mg ) daily for 7 days then  Take 2 tablets (10mg ) daily for 7 days then  Take 1 tablet (5mg ) daily for 7 days then stop taking Prednisone  I will see you back in 1-2 months with a breathing & walking test at that time  TESTS ORDERED: 1. Spirometry with DLCO at next appointment 2. 6MWT on room air at next appointment 3. [facility] review of lung biopsy for second opinion

## 2016-03-19 NOTE — Addendum Note (Signed)
Addended by: Parke Poisson E on: 03/19/2016 04:18 PM   Modules accepted: Orders

## 2016-03-23 ENCOUNTER — Encounter: Payer: Self-pay | Admitting: Physician Assistant

## 2016-03-23 ENCOUNTER — Ambulatory Visit: Payer: Medicaid Other | Admitting: Physician Assistant

## 2016-03-23 VITALS — BP 124/74 | HR 75 | Temp 98.1°F | Ht 71.0 in | Wt 195.0 lb

## 2016-03-23 DIAGNOSIS — I10 Essential (primary) hypertension: Secondary | ICD-10-CM

## 2016-03-23 DIAGNOSIS — E785 Hyperlipidemia, unspecified: Secondary | ICD-10-CM

## 2016-03-23 NOTE — Progress Notes (Signed)
BP 124/74 mmHg  Pulse 75  Temp(Src) 98.1 F (36.7 C)  Ht 5\' 11"  (1.803 m)  Wt 195 lb (88.451 kg)  BMI 27.21 kg/m2  SpO2 96%   Subjective:    Patient ID: Shawn [name], male    DOB: 1970/12/05, 45 y.o.   MRN: [id number]  HPI: Shawn [name] is a 45 y.o. male presenting on 03/23/2016 for Hypertension   HPI  -Pt is [facility] issues -pt is being seen by GI for diarrhea/GERD -pt still being evaluated by pulmonology for breathing issues    Relevant past medical, surgical, family and social history reviewed and updated as indicated. Interim medical history since our last visit reviewed. Allergies and medications reviewed and updated.   Current outpatient prescriptions:  .  albuterol (PROVENTIL HFA;VENTOLIN HFA) 108 (90 BASE) MCG/ACT inhaler, Inhale 1-2 puffs into the lungs every 6 (six) hours as needed for wheezing or shortness of breath., Disp: , Rfl:  .  cyclobenzaprine (FLEXERIL) 10 MG tablet, Take 10 mg by mouth 3 (three) times daily as needed for muscle spasms., Disp: , Rfl:  .  dexlansoprazole (DEXILANT) 60 MG capsule, Take 1 capsule (60 mg total) by mouth daily., Disp: 20 capsule, Rfl: 0 .  diclofenac (VOLTAREN) 75 MG EC tablet, Take 75 mg by mouth 2 (two) times daily as needed for mild pain. , Disp: , Rfl:  .  dicyclomine (BENTYL) 10 MG capsule, Take 1 capsule (10 mg total) by mouth 4 (four) times daily -  before meals and at bedtime. As needed for loose stools and abdominal pain, Disp: 120 capsule, Rfl: 2 .  DULoxetine (CYMBALTA) 30 MG capsule, Take 30 mg by mouth daily., Disp: , Rfl:  .  FLUoxetine (PROZAC) 40 MG capsule, Take 40 mg by mouth daily., Disp: , Rfl:  .  gabapentin (NEURONTIN) 300 MG capsule, Take 300 mg by mouth 3 (three) times daily as needed (for pain). , Disp: , Rfl:  .  lamoTRIgine (LAMICTAL) 25 MG tablet, Take 25 mg by mouth 2 (two) times daily. , Disp: , Rfl:  .  metoprolol tartrate (LOPRESSOR) 25 MG tablet, Take 0.5 tablets  (12.5 mg total) by mouth 2 (two) times daily. (Patient taking differently: Take 25 mg by mouth 2 (two) times daily. ), Disp: , Rfl:  .  Multiple Vitamin (MULTIVITAMIN) tablet, Take 1 tablet by mouth daily., Disp: , Rfl:  .  naproxen (NAPROSYN) 500 MG tablet, Take 1 tablet (500 mg total) by mouth 2 (two) times daily. (Patient taking differently: Take 500 mg by mouth 2 (two) times daily as needed. ), Disp: 30 tablet, Rfl: 0 .  ondansetron (ZOFRAN) 4 MG tablet, Take 1 tablet (4 mg total) by mouth every 8 (eight) hours as needed for nausea or vomiting., Disp: 30 tablet, Rfl: 1 .  oxyCODONE-acetaminophen (PERCOCET/ROXICET) 5-325 MG tablet, Take 2 tablets by mouth every 4 (four) hours as needed., Disp: 6 tablet, Rfl: 0 .  predniSONE (DELTASONE) 5 MG tablet, Take as directed at the 03/19/16 visit with Dr Ashok Cordia (Patient taking differently: Take 35 mg by mouth daily. Take as directed at the 03/19/16 visit with Dr Ashok Cordia), Disp: 100 tablet, Rfl: 0 .  traMADol (ULTRAM) 50 MG tablet, Take 1-2 tablets (50-100 mg total) by mouth every 6 (six) hours as needed (mild pain). (Patient taking differently: Take 100 mg by mouth every 6 (six) hours as needed (mild pain). ), Disp: 30 tablet, Rfl: 0 .  traZODone (DESYREL) 150 MG tablet, Take  300 mg by mouth at bedtime as needed for sleep. , Disp: , Rfl:    Review of Systems  Constitutional: Negative for fever, chills, diaphoresis, appetite change, fatigue and unexpected weight change.  HENT: Positive for dental problem, facial swelling (secondary to prednisone) and hearing loss. Negative for congestion, drooling, ear pain, mouth sores, sneezing, sore throat and voice change.   Eyes: Positive for redness and itching. Negative for pain and discharge.  Respiratory: Positive for cough, shortness of breath and wheezing. Negative for choking.   Cardiovascular: Negative for chest pain, palpitations and leg swelling.  Gastrointestinal: Positive for vomiting. Negative for abdominal  pain, diarrhea, constipation and blood in stool.  Endocrine: Negative for cold intolerance and heat intolerance.  Genitourinary: Negative for dysuria, hematuria and decreased urine volume.  Musculoskeletal: Positive for back pain and arthralgias. Negative for gait problem.  Skin: Negative for rash.  Allergic/Immunologic: Negative for environmental allergies.  Neurological: Negative for seizures, syncope and headaches.  Hematological: Negative for adenopathy.  Psychiatric/Behavioral: Positive for dysphoric mood and agitation. Negative for suicidal ideas. The patient is nervous/anxious.     Per HPI unless specifically indicated above     Objective:    BP 124/74 mmHg  Pulse 75  Temp(Src) 98.1 F (36.7 C)  Ht 5\' 11"  (1.803 m)  Wt 195 lb (88.451 kg)  BMI 27.21 kg/m2  SpO2 96%  Wt Readings from Last 3 Encounters:  03/23/16 195 lb (88.451 kg)  03/19/16 191 lb 12.8 oz (87 kg)  03/02/16 198 lb (89.812 kg)    Physical Exam  Constitutional: He is oriented to person, place, and time. He appears well-developed and well-nourished.  HENT:  Head: Normocephalic and atraumatic.  Neck: Neck supple.  Cardiovascular: Normal rate and regular rhythm.   Pulmonary/Chest: Effort normal and breath sounds normal. He has no wheezes.  Abdominal: Soft. Bowel sounds are normal. There is no hepatosplenomegaly. There is no tenderness.  Musculoskeletal: He exhibits no edema.  Lymphadenopathy:    He has no cervical adenopathy.  Neurological: He is alert and oriented to person, place, and time.  Skin: Skin is warm and dry.  Psychiatric: He has a normal mood and affect. His behavior is normal.  Vitals reviewed.       Assessment & Plan:   Encounter Diagnoses  Name Primary?  . Essential hypertension, benign Yes  . Hyperlipidemia      -bp good. Continue metoprolol -pt to continue with specialists -Will f/u and recheck lipids in 3 months- pt has medicaid -RTO sooner prn

## 2016-04-16 ENCOUNTER — Other Ambulatory Visit: Payer: Self-pay | Admitting: Nurse Practitioner

## 2016-04-20 ENCOUNTER — Encounter (HOSPITAL_COMMUNITY): Payer: Self-pay | Admitting: *Deleted

## 2016-04-20 ENCOUNTER — Emergency Department (HOSPITAL_COMMUNITY)
Admission: EM | Admit: 2016-04-20 | Discharge: 2016-04-21 | Disposition: A | Discharge status: Home / Self Care | Payer: Medicaid Other | Attending: Emergency Medicine | Admitting: Emergency Medicine

## 2016-04-20 DIAGNOSIS — Z87891 Personal history of nicotine dependence: Secondary | ICD-10-CM | POA: Insufficient documentation

## 2016-04-20 DIAGNOSIS — E785 Hyperlipidemia, unspecified: Secondary | ICD-10-CM | POA: Insufficient documentation

## 2016-04-20 DIAGNOSIS — J449 Chronic obstructive pulmonary disease, unspecified: Secondary | ICD-10-CM | POA: Diagnosis not present

## 2016-04-20 DIAGNOSIS — M1990 Unspecified osteoarthritis, unspecified site: Secondary | ICD-10-CM | POA: Diagnosis not present

## 2016-04-20 DIAGNOSIS — I10 Essential (primary) hypertension: Secondary | ICD-10-CM | POA: Insufficient documentation

## 2016-04-20 DIAGNOSIS — D8989 Other specified disorders involving the immune mechanism, not elsewhere classified: Secondary | ICD-10-CM

## 2016-04-20 DIAGNOSIS — R509 Fever, unspecified: Secondary | ICD-10-CM | POA: Diagnosis not present

## 2016-04-20 DIAGNOSIS — R0602 Shortness of breath: Secondary | ICD-10-CM | POA: Diagnosis present

## 2016-04-20 DIAGNOSIS — D803 Selective deficiency of immunoglobulin G [IgG] subclasses: Secondary | ICD-10-CM | POA: Diagnosis not present

## 2016-04-20 DIAGNOSIS — R112 Nausea with vomiting, unspecified: Secondary | ICD-10-CM | POA: Diagnosis not present

## 2016-04-20 DIAGNOSIS — R05 Cough: Secondary | ICD-10-CM | POA: Diagnosis not present

## 2016-04-20 DIAGNOSIS — F319 Bipolar disorder, unspecified: Secondary | ICD-10-CM | POA: Diagnosis not present

## 2016-04-20 DIAGNOSIS — R0600 Dyspnea, unspecified: Secondary | ICD-10-CM | POA: Diagnosis not present

## 2016-04-20 DIAGNOSIS — Z79899 Other long term (current) drug therapy: Secondary | ICD-10-CM | POA: Insufficient documentation

## 2016-04-20 DIAGNOSIS — R059 Cough, unspecified: Secondary | ICD-10-CM

## 2016-04-20 MED ORDER — IPRATROPIUM-ALBUTEROL 0.5-2.5 (3) MG/3ML IN SOLN
3.0000 mL | Freq: Once | RESPIRATORY_TRACT | Status: AC
  Administered 2016-04-21: 3 mL via RESPIRATORY_TRACT
  Filled 2016-04-20: qty 3

## 2016-04-20 MED ORDER — METHYLPREDNISOLONE SODIUM SUCC 125 MG IJ SOLR
125.0000 mg | Freq: Once | INTRAMUSCULAR | Status: AC
  Administered 2016-04-21: 125 mg via INTRAVENOUS
  Filled 2016-04-20: qty 2

## 2016-04-20 NOTE — ED Notes (Signed)
States he has a rare lung disease and has chronic breathing problems, states he recently was prescribed prednisone without relief

## 2016-04-20 NOTE — ED Provider Notes (Signed)
CSN: [id number]     Arrival date & time 04/20/16  2125 History  By signing my name below, I, Shawn [name], attest that this documentation has been prepared under the direction and in the presence of Shawn Fuel, MD . Electronically Signed: Higinio [name], Scribe. 04/20/2016. 11:59 PM.   Chief Complaint  Patient presents with  . Shortness of Breath   The history is provided by the patient. No language interpreter was used.   HPI Comments:  Shawn [name] is a 45 y.o. male with PMHx of lung nodules, who presents to the Emergency Department complaining of chronic, gradually worsening, shortness of breath that worsened today. He reports associated symptoms of dry cough, nausea, vomiting, fever (TMAX100.4), chills, and chest tightness. 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 He states he was diagnosed with a very rare lung disease. He states his provider said he was going to send results to [facility] for further review. He reports his last dose of Prednisone was last Thursday; he notes he was taken off of it because it wasn't helping his condition. He states he was taken off of Symbicort as well for similar reasons; his symptoms appeared worse when he was taking it. He states that he has not taken any medication to alleviate his symptoms. He denies hx of smoking and any other complaints.   Past Medical History  Diagnosis Date  . Arthritis   . Back pain   . Bipolar 1 disorder ([place])   . GERD (gastroesophageal reflux disease)   . Diverticulitis   . Vomiting   . COPD (chronic obstructive pulmonary disease) ([place])   . Lung nodules   . Bipolar disorder ([place])   . Schizoaffective disorder, bipolar type ([place])   . Heart disease     "irregular heart beat"  . Anemia   . Hyperlipidemia   . Hypertension   . Family history of adverse reaction to anesthesia     he was adopted  . 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 HOH  (hard of hearing)     biological parents are both deaf.   left ear is good.  . 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 Brain surgery  2005    after head injury, patient states had 2 large blood clots evacuated  . Colonoscopy with esophagogastroduodenoscopy (egd) N/A 12/05/2013    [id number] erosive relfux/HH/melanosis coli/colonic diverticulosis. tubulovillous adenoma removed. next tcs 11/2016  . Flexible bronchoscopy N/A 01/23/2016    Procedure: FLEXIBLE BRONCHOSCOPY;  Surgeon: Shawn Pollack, MD;  Location: [facility] OR;  Service: Thoracic;  Laterality: N/A;  . Video assisted thoracoscopy Left 01/23/2016    Procedure: LEFT VIDEO ASSISTED THORACOSCOPY;  Surgeon: Shawn Pollack, MD;  Location: [facility] OR;  Service: Thoracic;  Laterality: Left;  . Lung biopsy N/A 01/23/2016    Procedure: LEFT LUNG BIOPSY;  Surgeon: Shawn Pollack, MD;  Location: [facility] OR;  Service: Thoracic;  Laterality: N/A;   Family History  Problem Relation Age of Onset  . Adopted: Yes  . Colon cancer Neg Hx     adopted at 72 months old, unsure about GI history of parents   . Alzheimer's disease Other   . Parkinson's disease Other   . Mental illness Other    Social History  Substance Use Topics  . Smoking status: Former  Smoker -- 0.75 packs/day for 25 years    Types: Cigars, Cigarettes    Quit date: 07/09/2014  . Smokeless tobacco: Current User    Types: Snuff     Comment: pt uses dip tobacco  . Alcohol Use: 0.0 oz/week    0 Standard drinks or equivalent per week     Comment: "once in awhile" average 1-2 times a week with 1-2 drinks per sitting    Review of Systems  Constitutional: Positive for fever and chills.  Respiratory: Positive for cough, chest tightness and shortness of breath.   Gastrointestinal: Positive for nausea and vomiting.  All other systems reviewed and are negative.  Allergies  Review of patient's allergies indicates no known allergies.  Home Medications   Prior to  Admission medications   Medication Sig Start Date End Date Taking? Authorizing Provider  albuterol (PROVENTIL HFA;VENTOLIN HFA) 108 (90 BASE) MCG/ACT inhaler Inhale 1-2 puffs into the lungs every 6 (six) hours as needed for wheezing or shortness of breath.    Historical Provider, MD  cyclobenzaprine (FLEXERIL) 10 MG tablet Take 10 mg by mouth 3 (three) times daily as needed for muscle spasms.    Historical Provider, MD  dexlansoprazole (DEXILANT) 60 MG capsule Take 1 capsule (60 mg total) by mouth daily. 07/03/15   Mahala Menghini, PA-C  diclofenac (VOLTAREN) 75 MG EC tablet Take 75 mg by mouth 2 (two) times daily as needed for mild pain.     Historical Provider, MD  dicyclomine (BENTYL) 10 MG capsule Take 1 capsule (10 mg total) by mouth 4 (four) times daily -  before meals and at bedtime. As needed for loose stools and abdominal pain 02/24/16   Carlis Stable, NP  DULoxetine (CYMBALTA) 30 MG capsule Take 30 mg by mouth daily.    Historical Provider, MD  FLUoxetine (PROZAC) 40 MG capsule Take 40 mg by mouth daily.    Historical Provider, MD  gabapentin (NEURONTIN) 300 MG capsule Take 300 mg by mouth 3 (three) times daily as needed (for pain).     Historical Provider, MD  lamoTRIgine (LAMICTAL) 25 MG tablet Take 25 mg by mouth 2 (two) times daily.     Historical Provider, MD  metoprolol tartrate (LOPRESSOR) 25 MG tablet Take 0.5 tablets (12.5 mg total) by mouth 2 (two) times daily. Patient taking differently: Take 25 mg by mouth 2 (two) times daily.  01/27/16   Erin R Barrett, PA-C  Multiple Vitamin (MULTIVITAMIN) tablet Take 1 tablet by mouth daily.    Historical Provider, MD  naproxen (NAPROSYN) 500 MG tablet Take 1 tablet (500 mg total) by mouth 2 (two) times daily. Patient taking differently: Take 500 mg by mouth 2 (two) times daily as needed.  01/31/16   Tanna Furry, MD  ondansetron (ZOFRAN) 4 MG tablet TAKE ONE TABLET BY MOUTH EVERY 8 HOURS AS NEEDED FOR NAUSEA OR VOMITING 04/20/16   Carlis Stable, NP   oxyCODONE-acetaminophen (PERCOCET/ROXICET) 5-325 MG tablet Take 2 tablets by mouth every 4 (four) hours as needed. 01/31/16   Tanna Furry, MD  predniSONE (DELTASONE) 5 MG tablet Take as directed at the 03/19/16 visit with Dr Ashok Cordia Patient taking differently: Take 35 mg by mouth daily. Take as directed at the 03/19/16 visit with Dr Ashok Cordia 03/19/16   Javier Glazier, MD  traMADol (ULTRAM) 50 MG tablet Take 1-2 tablets (50-100 mg total) by mouth every 6 (six) hours as needed (mild pain). Patient taking differently: Take 100 mg by mouth every 6 (six)  hours as needed (mild pain).  01/27/16   Erin R Barrett, PA-C  traZODone (DESYREL) 150 MG tablet Take 300 mg by mouth at bedtime as needed for sleep.     Historical Provider, MD   BP 134/84 mmHg  Pulse 99  Temp(Src) 100.4 F (38 C) (Oral)  Resp 18  Ht 5\' 11"  (1.803 m)  Wt 191 lb (86.637 kg)  BMI 26.65 kg/m2  SpO2 98% Physical Exam  Constitutional: He is oriented to person, place, and time. He appears well-developed and well-nourished.  HENT:  Head: Normocephalic and atraumatic.  Eyes: EOM are normal. Pupils are equal, round, and reactive to light. Right eye exhibits no discharge. Left eye exhibits no discharge.  Neck: Normal range of motion. Neck supple. No JVD present.  Cardiovascular: Normal rate, regular rhythm and normal heart sounds.   No murmur heard. Pulmonary/Chest: Effort normal. He has wheezes. He has no rales. He exhibits no tenderness.  Mild expiratory wheezes coughing triggered by deep breathing  Abdominal: Soft. Bowel sounds are normal. He exhibits no distension and no mass. There is no tenderness.  Musculoskeletal: Normal range of motion. He exhibits no edema.  Lymphadenopathy:    He has no cervical adenopathy.  Neurological: He is alert and oriented to person, place, and time. No cranial nerve deficit. He exhibits normal muscle tone. Coordination normal.  Skin: Skin is warm and dry. No rash noted.  Psychiatric: He has a normal  mood and affect. His behavior is normal. Judgment and thought content normal.  Nursing note and vitals reviewed.   ED Course  Procedures  DIAGNOSTIC STUDIES:  Oxygen Saturation is 98% on RA, normal by my interpretation.    COORDINATION OF CARE:  11:56 PM Discussed treatment [name], which includes CXR with pt at bedside and pt agreed to [name].  Labs Review Results for orders placed or performed during the hospital encounter of 04/20/16  Comprehensive metabolic panel  Result Value Ref Range   Sodium 140 135 - 145 mmol/L   Potassium 3.7 3.5 - 5.1 mmol/L   Chloride 105 101 - 111 mmol/L   CO2 28 22 - 32 mmol/L   Glucose, Bld 101 (H) 65 - 99 mg/dL   BUN 15 6 - 20 mg/dL   Creatinine, Ser 1.17 0.61 - 1.24 mg/dL   Calcium 8.5 (L) 8.9 - 10.3 mg/dL   Total Protein 6.7 6.5 - 8.1 g/dL   Albumin 3.5 3.5 - 5.0 g/dL   AST 22 15 - 41 U/L   ALT 23 17 - 63 U/L   Alkaline Phosphatase 46 38 - 126 U/L   Total Bilirubin 0.6 0.3 - 1.2 mg/dL   GFR calc non Af Amer >60 >60 mL/min   GFR calc Af Amer >60 >60 mL/min   Anion gap 7 5 - 15  CBC with Differential  Result Value Ref Range   WBC 5.5 4.0 - 10.5 K/uL   RBC 4.23 4.22 - 5.81 MIL/uL   Hemoglobin 13.4 13.0 - 17.0 g/dL   HCT 39.8 39.0 - 52.0 %   MCV 94.1 78.0 - 100.0 fL   MCH 31.7 26.0 - 34.0 pg   MCHC 33.7 30.0 - 36.0 g/dL   RDW 13.9 11.5 - 15.5 %   Platelets 190 150 - 400 K/uL   Neutrophils Relative % 65 %   Neutro Abs 3.6 1.7 - 7.7 K/uL   Lymphocytes Relative 22 %   Lymphs Abs 1.2 0.7 - 4.0 K/uL   Monocytes Relative 11 %  Monocytes Absolute 0.6 0.1 - 1.0 K/uL   Eosinophils Relative 2 %   Eosinophils Absolute 0.1 0.0 - 0.7 K/uL   Basophils Relative 0 %   Basophils Absolute 0.0 0.0 - 0.1 K/uL  Brain natriuretic peptide  Result Value Ref Range   B Natriuretic Peptide 27.0 0.0 - 100.0 pg/mL  Troponin I  Result Value Ref Range   Troponin I <0.03 <0.031 ng/mL   Imaging Review Dg Chest 2 View  04/21/2016  CLINICAL DATA:  Cough and  shortness of breath. Nausea, vomiting, fever, chills, chest tightness. Recent lung biopsy. EXAM: CHEST  2 VIEW COMPARISON:  Chest 02/20/2016.  CT chest 02/20/2016. FINDINGS: Normal heart size and pulmonary vascularity. Diffuse nodular interstitial pattern to the lungs. Postoperative changes in the left lung base. No blunting of costophrenic angles. No pneumothorax. No focal consolidation. IMPRESSION: Diffuse reticular nodular interstitial pattern to the lungs. No evidence of active pulmonary disease. Electronically Signed   By: Lucienne Capers M.D.   On: 04/21/2016 00:44   I have personally reviewed and evaluated these images and lab results as part of my medical decision-making.   EKG Interpretation   Date/Time:  Tuesday April 20 2016 22:41:48 EDT Ventricular Rate:  85 PR Interval:  165 QRS Duration: 81 QT Interval:  345 QTC Calculation: 410 R Axis:   -7 Text Interpretation:  Sinus rhythm ST elev, probable normal early repol  pattern When compared with ECG of 02/19/2016, No old tracing to compare  Confirmed by [facility]  MD, Rayyan Orsborn ([id number]) on 04/20/2016 11:53:43 PM      MDM   Final diagnoses:  Dyspnea  Cough  IgG4-related sclerosing disease (HCC)    Dyspnea and cough with low-grade fever. Old records are reviewed and he has been diagnosed with IgG for deficiency related lung disease. He has failed to respond to prednisone in the past. Patient states that his rescue inhaler has not been effective either. He does have findings suggestive of bronchospasm on exam, so he is given methylprednisolone and albuterol with ipratropium. Following this, he states he feels no better but lungs do sound clear. He still has significant cough. Albuterol with ipratropium is repeated. On further review of his records, he apparently did not get better with prednisone in the past and he is supposed to go to [facility] for a second opinion. He is maintaining adequate oxygen saturation. I will put him back on  prednisone but will give prescription for hydrocodone-acetaminophen to use as a cough suppressant. He will need to follow up with pulmonology regarding taper from this prednisone.  I personally performed the services described in this documentation, which was scribed in my presence. The recorded information has been reviewed and is accurate.      Shawn Fuel, MD [phone]

## 2016-04-21 ENCOUNTER — Emergency Department (HOSPITAL_COMMUNITY): Payer: Medicaid Other

## 2016-04-21 LAB — COMPREHENSIVE METABOLIC PANEL
ALT: 23 U/L (ref 17–63)
ANION GAP: 7 (ref 5–15)
AST: 22 U/L (ref 15–41)
Albumin: 3.5 g/dL (ref 3.5–5.0)
Alkaline Phosphatase: 46 U/L (ref 38–126)
BILIRUBIN TOTAL: 0.6 mg/dL (ref 0.3–1.2)
BUN: 15 mg/dL (ref 6–20)
CO2: 28 mmol/L (ref 22–32)
Calcium: 8.5 mg/dL — ABNORMAL LOW (ref 8.9–10.3)
Chloride: 105 mmol/L (ref 101–111)
Creatinine, Ser: 1.17 mg/dL (ref 0.61–1.24)
GFR calc Af Amer: >60 mL/min (ref >60)
Glucose, Bld: 101 mg/dL — ABNORMAL HIGH (ref 65–99)
POTASSIUM: 3.7 mmol/L (ref 3.5–5.1)
Sodium: 140 mmol/L (ref 135–145)
TOTAL PROTEIN: 6.7 g/dL (ref 6.5–8.1)

## 2016-04-21 LAB — CBC WITH DIFFERENTIAL/PLATELET
Basophils Absolute: 0 10*3/uL (ref 0.0–0.1)
Basophils Relative: 0 %
Eosinophils Absolute: 0.1 10*3/uL (ref 0.0–0.7)
Eosinophils Relative: 2 %
HEMATOCRIT: 39.8 % (ref 39.0–52.0)
HEMOGLOBIN: 13.4 g/dL (ref 13.0–17.0)
LYMPHS ABS: 1.2 10*3/uL (ref 0.7–4.0)
Lymphocytes Relative: 22 %
MCH: 31.7 pg (ref 26.0–34.0)
MCHC: 33.7 g/dL (ref 30.0–36.0)
MCV: 94.1 fL (ref 78.0–100.0)
MONO ABS: 0.6 10*3/uL (ref 0.1–1.0)
MONOS PCT: 11 %
NEUTROS ABS: 3.6 10*3/uL (ref 1.7–7.7)
Neutrophils Relative %: 65 %
Platelets: 190 10*3/uL (ref 150–400)
RBC: 4.23 MIL/uL (ref 4.22–5.81)
RDW: 13.9 % (ref 11.5–15.5)
WBC: 5.5 10*3/uL (ref 4.0–10.5)

## 2016-04-21 LAB — BRAIN NATRIURETIC PEPTIDE: B Natriuretic Peptide: 27 pg/mL (ref 0.0–100.0)

## 2016-04-21 LAB — TROPONIN I

## 2016-04-21 MED ORDER — ACETAMINOPHEN 325 MG PO TABS
650.0000 mg | ORAL_TABLET | Freq: Once | ORAL | Status: AC
  Administered 2016-04-21: 650 mg via ORAL
  Filled 2016-04-21: qty 2

## 2016-04-21 MED ORDER — IPRATROPIUM-ALBUTEROL 0.5-2.5 (3) MG/3ML IN SOLN
3.0000 mL | Freq: Once | RESPIRATORY_TRACT | Status: AC
  Administered 2016-04-21: 3 mL via RESPIRATORY_TRACT
  Filled 2016-04-21: qty 3

## 2016-04-21 MED ORDER — PREDNISONE 50 MG PO TABS: 50.0000 mg | ORAL_TABLET | Freq: Every day | ORAL | Status: DC

## 2016-04-21 MED ORDER — HYDROCODONE-ACETAMINOPHEN 5-325 MG PO TABS: 1.0000 | ORAL_TABLET | ORAL | Status: DC | PRN

## 2016-04-21 NOTE — Discharge Instructions (Signed)
Cough, Adult Coughing is a reflex that clears your throat and your airways. Coughing helps to heal and protect your lungs. It is normal to cough occasionally, but a cough that happens with other symptoms or lasts a long time may be a sign of a condition that needs treatment. A cough may last only 2-3 weeks (acute), or it may last longer than 8 weeks (chronic). CAUSES Coughing is commonly caused by:  Breathing in substances that irritate your lungs.  A viral or bacterial respiratory infection.  Allergies.  Asthma.  Postnasal drip.  Smoking.  Acid backing up from the stomach into the esophagus (gastroesophageal reflux).  Certain medicines.  Chronic lung problems, including COPD (or rarely, lung cancer).  Other medical conditions such as heart failure. HOME CARE INSTRUCTIONS  Pay attention to any changes in your symptoms. Take these actions to help with your discomfort:  Take medicines only as told by your health care provider.  If you were prescribed an antibiotic medicine, take it as told by your health care provider. Do not stop taking the antibiotic even if you start to feel better.  Talk with your health care provider before you take a cough suppressant medicine.  Drink enough fluid to keep your urine clear or pale yellow.  If the air is dry, use a cold steam vaporizer or humidifier in your bedroom or your home to help loosen secretions.  Avoid anything that causes you to cough at work or at home.  If your cough is worse at night, try sleeping in a semi-upright position.  Avoid cigarette smoke. If you smoke, quit smoking. If you need help quitting, ask your health care provider.  Avoid caffeine.  Avoid alcohol.  Rest as needed. SEEK MEDICAL CARE IF:   You have new symptoms.  You cough up pus.  Your cough does not get better after 2-3 weeks, or your cough gets worse.  You cannot control your cough with suppressant medicines and you are losing sleep.  You  develop pain that is getting worse or pain that is not controlled with pain medicines.  You have a fever.  You have unexplained weight loss.  You have night sweats. SEEK IMMEDIATE MEDICAL CARE IF:  You cough up blood.  You have difficulty breathing.  Your heartbeat is very fast.   This information is not intended to replace advice given to you by your health care provider. Make sure you discuss any questions you have with your health care provider.   Document Released: 04/23/2011 Document Revised: 07/16/2015 Document Reviewed: 01/01/2015 Elsevier Interactive Patient Education 2016 [employer]  Prednisone tablets What is this medicine? PREDNISONE (PRED ni sone) is a corticosteroid. It is commonly used to treat inflammation of the skin, joints, lungs, and other organs. Common conditions treated include asthma, allergies, and arthritis. It is also used for other conditions, such as blood disorders and diseases of the adrenal glands. This medicine may be used for other purposes; ask your health care provider or pharmacist if you have questions. What should I tell my health care provider before I take this medicine? They need to know if you have any of these conditions: -Cushing's syndrome -diabetes -glaucoma -heart disease -high blood pressure -infection (especially a virus infection such as chickenpox, cold sores, or herpes) -kidney disease -liver disease -mental illness -myasthenia gravis -osteoporosis -seizures -stomach or intestine problems -thyroid disease -an unusual or allergic reaction to lactose, prednisone, other medicines, foods, dyes, or preservatives -pregnant or trying to get pregnant -breast-feeding How  should I use this medicine? Take this medicine by mouth with a glass of water. Follow the directions on the prescription label. Take this medicine with food. If you are taking this medicine once a day, take it in the morning. Do not take more medicine than you  are told to take. Do not suddenly stop taking your medicine because you may develop a severe reaction. Your doctor will tell you how much medicine to take. If your doctor wants you to stop the medicine, the dose may be slowly lowered over time to avoid any side effects. Talk to your pediatrician regarding the use of this medicine in children. Special care may be needed. Overdosage: If you think you have taken too much of this medicine contact a poison control center or emergency room at once. NOTE: This medicine is only for you. Do not share this medicine with others. What if I miss a dose? If you miss a dose, take it as soon as you can. If it is almost time for your next dose, talk to your doctor or health care professional. You may need to miss a dose or take an extra dose. Do not take double or extra doses without advice. What may interact with this medicine? Do not take this medicine with any of the following medications: -metyrapone -mifepristone This medicine may also interact with the following medications: -aminoglutethimide -amphotericin B -aspirin and aspirin-like medicines -barbiturates -certain medicines for diabetes, like glipizide or glyburide -cholestyramine -cholinesterase inhibitors -cyclosporine -digoxin -diuretics -ephedrine -male hormones, like estrogens and birth control pills -isoniazid -ketoconazole -NSAIDS, medicines for pain and inflammation, like ibuprofen or naproxen -phenytoin -rifampin -toxoids -vaccines -warfarin This list may not describe all possible interactions. Give your health care provider a list of all the medicines, herbs, non-prescription drugs, or dietary supplements you use. Also tell them if you smoke, drink alcohol, or use illegal drugs. Some items may interact with your medicine. What should I watch for while using this medicine? Visit your doctor or health care professional for regular checks on your progress. If you are taking this  medicine over a prolonged period, carry an identification card with your name and address, the type and dose of your medicine, and your doctor's name and address. This medicine may increase your risk of getting an infection. Tell your doctor or health care professional if you are around anyone with measles or chickenpox, or if you develop sores or blisters that do not heal properly. If you are going to have surgery, tell your doctor or health care professional that you have taken this medicine within the last twelve months. Ask your doctor or health care professional about your diet. You may need to lower the amount of salt you eat. This medicine may affect blood sugar levels. If you have diabetes, check with your doctor or health care professional before you change your diet or the dose of your diabetic medicine. What side effects may I notice from receiving this medicine? Side effects that you should report to your doctor or health care professional as soon as possible: -allergic reactions like skin rash, itching or hives, swelling of the face, lips, or tongue -changes in emotions or moods -changes in vision -depressed mood -eye pain -fever or chills, cough, sore throat, pain or difficulty passing urine -increased thirst -swelling of ankles, feet Side effects that usually do not require medical attention (report to your doctor or health care professional if they continue or are bothersome): -confusion, excitement, restlessness -headache -nausea, vomiting -  skin problems, acne, thin and shiny skin -trouble sleeping -weight gain This list may not describe all possible side effects. Call your doctor for medical advice about side effects. You may report side effects to FDA at 1-800-FDA-1088. Where should I keep my medicine? Keep out of the reach of children. Store at room temperature between 15 and 30 degrees C (59 and 86 degrees F). Protect from light. Keep container tightly closed. Throw away  any unused medicine after the expiration date. NOTE: This sheet is a summary. It may not cover all possible information. If you have questions about this medicine, talk to your doctor, pharmacist, or health care provider.    2016, Elsevier/[employer] Standard. (2011-06-10 10:57:14)  Acetaminophen; Hydrocodone tablets or capsules What is this medicine? ACETAMINOPHEN; HYDROCODONE (a set a MEE noe fen; hye droe KOE done) is a pain reliever. It is used to treat moderate to severe pain. This medicine may be used for other purposes; ask your health care provider or pharmacist if you have questions. What should I tell my health care provider before I take this medicine? They need to know if you have any of these conditions: -brain tumor -Crohn's disease, inflammatory bowel disease, or ulcerative colitis -drug abuse or addiction -head injury -heart or circulation problems -if you often drink alcohol -kidney disease or problems going to the bathroom -liver disease -lung disease, asthma, or breathing problems -an unusual or allergic reaction to acetaminophen, hydrocodone, other opioid analgesics, other medicines, foods, dyes, or preservatives -pregnant or trying to get pregnant -breast-feeding How should I use this medicine? Take this medicine by mouth. Swallow it with a full glass of water. Follow the directions on the prescription label. If the medicine upsets your stomach, take the medicine with food or milk. Do not take more than you are told to take. Talk to your pediatrician regarding the use of this medicine in children. This medicine is not approved for use in children. Patients over 65 years may have a stronger reaction and need a smaller dose. Overdosage: If you think you have taken too much of this medicine contact a poison control center or emergency room at once. NOTE: This medicine is only for you. Do not share this medicine with others. What if I miss a dose? If you miss a dose, take it  as soon as you can. If it is almost time for your next dose, take only that dose. Do not take double or extra doses. What may interact with this medicine? -alcohol -antihistamines -isoniazid -medicines for depression, anxiety, or psychotic disturbances -medicines for sleep -muscle relaxants -naltrexone -narcotic medicines (opiates) for pain -phenobarbital -ritonavir -tramadol This list may not describe all possible interactions. Give your health care provider a list of all the medicines, herbs, non-prescription drugs, or dietary supplements you use. Also tell them if you smoke, drink alcohol, or use illegal drugs. Some items may interact with your medicine. What should I watch for while using this medicine? Tell your doctor or health care professional if your pain does not go away, if it gets worse, or if you have new or a different type of pain. You may develop tolerance to the medicine. Tolerance means that you will need a higher dose of the medicine for pain relief. Tolerance is normal and is expected if you take the medicine for a long time. Do not suddenly stop taking your medicine because you may develop a severe reaction. Your body becomes used to the medicine. This does NOT mean you are  addicted. Addiction is a behavior related to getting and using a drug for a non-medical reason. If you have pain, you have a medical reason to take pain medicine. Your doctor will tell you how much medicine to take. If your doctor wants you to stop the medicine, the dose will be slowly lowered over time to avoid any side effects. You may get drowsy or dizzy when you first start taking the medicine or change doses. Do not drive, use machinery, or do anything that may be dangerous until you know how the medicine affects you. Stand or sit up slowly. There are different types of narcotic medicines (opiates) for pain. If you take more than one type at the same time, you may have more side effects. Give your  health care provider a list of all medicines you use. Your doctor will tell you how much medicine to take. Do not take more medicine than directed. Call emergency for help if you have problems breathing. The medicine will cause constipation. Try to have a bowel movement at least every 2 to 3 days. If you do not have a bowel movement for 3 days, call your doctor or health care professional. Too much acetaminophen can be very dangerous. Do not take Tylenol (acetaminophen) or medicines that contain acetaminophen with this medicine. Many non-prescription medicines contain acetaminophen. Always read the labels carefully. What side effects may I notice from receiving this medicine? Side effects that you should report to your doctor or health care professional as soon as possible: -allergic reactions like skin rash, itching or hives, swelling of the face, lips, or tongue -breathing problems -confusion -feeling faint or lightheaded, falls -stomach pain -yellowing of the eyes or skin Side effects that usually do not require medical attention (report to your doctor or health care professional if they continue or are bothersome): -nausea, vomiting -stomach upset This list may not describe all possible side effects. Call your doctor for medical advice about side effects. You may report side effects to FDA at 1-800-FDA-1088. Where should I keep my medicine? Keep out of the reach of children. This medicine can be abused. Keep your medicine in a safe place to protect it from theft. Do not share this medicine with anyone. Selling or giving away this medicine is dangerous and against the law. This medicine may cause accidental overdose and death if it taken by other adults, children, or pets. Mix any unused medicine with a substance like cat litter or coffee grounds. Then throw the medicine away in a sealed container like a sealed bag or a coffee can with a lid. Do not use the medicine after the expiration  date. Store at room temperature between 15 and 30 degrees C (59 and 86 degrees F). NOTE: This sheet is a summary. It may not cover all possible information. If you have questions about this medicine, talk to your doctor, pharmacist, or health care provider.    2016, [employer] Standard. (2014-09-25 15:29:20)

## 2016-04-21 NOTE — ED Notes (Signed)
Patient refused discharge papers and prescriptions, stated " I can't take Prednisone and I'm already on enough pain medicines".

## 2016-04-23 ENCOUNTER — Encounter (HOSPITAL_COMMUNITY): Payer: Self-pay

## 2016-04-23 ENCOUNTER — Inpatient Hospital Stay (HOSPITAL_COMMUNITY)
Admission: EM | Admit: 2016-04-23 | Discharge: 2016-04-26 | DRG: 315 | Disposition: A | Discharge status: Home / Self Care | Payer: Medicaid Other | Attending: Family Medicine | Admitting: Family Medicine

## 2016-04-23 ENCOUNTER — Emergency Department (HOSPITAL_COMMUNITY): Payer: Medicaid Other

## 2016-04-23 DIAGNOSIS — I959 Hypotension, unspecified: Secondary | ICD-10-CM | POA: Diagnosis not present

## 2016-04-23 DIAGNOSIS — Z79899 Other long term (current) drug therapy: Secondary | ICD-10-CM

## 2016-04-23 DIAGNOSIS — I10 Essential (primary) hypertension: Secondary | ICD-10-CM | POA: Diagnosis present

## 2016-04-23 DIAGNOSIS — E871 Hypo-osmolality and hyponatremia: Secondary | ICD-10-CM | POA: Diagnosis present

## 2016-04-23 DIAGNOSIS — W57XXXA Bitten or stung by nonvenomous insect and other nonvenomous arthropods, initial encounter: Secondary | ICD-10-CM | POA: Diagnosis present

## 2016-04-23 DIAGNOSIS — R05 Cough: Secondary | ICD-10-CM | POA: Diagnosis present

## 2016-04-23 DIAGNOSIS — E876 Hypokalemia: Secondary | ICD-10-CM

## 2016-04-23 DIAGNOSIS — R509 Fever, unspecified: Secondary | ICD-10-CM | POA: Diagnosis present

## 2016-04-23 DIAGNOSIS — H9190 Unspecified hearing loss, unspecified ear: Secondary | ICD-10-CM | POA: Diagnosis present

## 2016-04-23 DIAGNOSIS — R059 Cough, unspecified: Secondary | ICD-10-CM | POA: Diagnosis present

## 2016-04-23 DIAGNOSIS — F250 Schizoaffective disorder, bipolar type: Secondary | ICD-10-CM | POA: Diagnosis present

## 2016-04-23 DIAGNOSIS — D8989 Other specified disorders involving the immune mechanism, not elsewhere classified: Secondary | ICD-10-CM

## 2016-04-23 DIAGNOSIS — Z820 Family history of epilepsy and other diseases of the nervous system: Secondary | ICD-10-CM

## 2016-04-23 DIAGNOSIS — F17290 Nicotine dependence, other tobacco product, uncomplicated: Secondary | ICD-10-CM | POA: Diagnosis present

## 2016-04-23 DIAGNOSIS — E785 Hyperlipidemia, unspecified: Secondary | ICD-10-CM | POA: Diagnosis present

## 2016-04-23 DIAGNOSIS — R319 Hematuria, unspecified: Secondary | ICD-10-CM | POA: Diagnosis present

## 2016-04-23 DIAGNOSIS — J449 Chronic obstructive pulmonary disease, unspecified: Secondary | ICD-10-CM | POA: Diagnosis present

## 2016-04-23 DIAGNOSIS — K210 Gastro-esophageal reflux disease with esophagitis: Secondary | ICD-10-CM | POA: Diagnosis present

## 2016-04-23 LAB — INFLUENZA PANEL BY PCR (TYPE A & B)
H1N1 flu by pcr: NOT DETECTED
Influenza A By PCR: NEGATIVE
Influenza B By PCR: NEGATIVE

## 2016-04-23 LAB — COMPREHENSIVE METABOLIC PANEL
ALT: 27 U/L (ref 17–63)
AST: 43 U/L — AB (ref 15–41)
Albumin: 3.4 g/dL — ABNORMAL LOW (ref 3.5–5.0)
Alkaline Phosphatase: 52 U/L (ref 38–126)
Anion gap: 7 (ref 5–15)
BILIRUBIN TOTAL: 0.7 mg/dL (ref 0.3–1.2)
BUN: 15 mg/dL (ref 6–20)
CHLORIDE: 103 mmol/L (ref 101–111)
CO2: 27 mmol/L (ref 22–32)
Calcium: 8.3 mg/dL — ABNORMAL LOW (ref 8.9–10.3)
Creatinine, Ser: 1.14 mg/dL (ref 0.61–1.24)
Glucose, Bld: 91 mg/dL (ref 65–99)
POTASSIUM: 3.3 mmol/L — AB (ref 3.5–5.1)
Sodium: 137 mmol/L (ref 135–145)
TOTAL PROTEIN: 6.8 g/dL (ref 6.5–8.1)

## 2016-04-23 LAB — RAPID STREP SCREEN (MED CTR MEBANE ONLY): STREPTOCOCCUS, GROUP A SCREEN (DIRECT): NEGATIVE

## 2016-04-23 LAB — CBC WITH DIFFERENTIAL/PLATELET
BASOS ABS: 0 10*3/uL (ref 0.0–0.1)
BASOS PCT: 1 %
EOS PCT: 0 %
Eosinophils Absolute: 0 10*3/uL (ref 0.0–0.7)
HCT: 42.7 % (ref 39.0–52.0)
Hemoglobin: 13.9 g/dL (ref 13.0–17.0)
LYMPHS PCT: 13 %
Lymphs Abs: 0.6 10*3/uL — ABNORMAL LOW (ref 0.7–4.0)
MCH: 31 pg (ref 26.0–34.0)
MCHC: 32.6 g/dL (ref 30.0–36.0)
MCV: 95.3 fL (ref 78.0–100.0)
Monocytes Absolute: 0.2 10*3/uL (ref 0.1–1.0)
Monocytes Relative: 4 %
Neutro Abs: 3.7 10*3/uL (ref 1.7–7.7)
Neutrophils Relative %: 82 %
PLATELETS: 171 10*3/uL (ref 150–400)
RBC: 4.48 MIL/uL (ref 4.22–5.81)
RDW: 14.4 % (ref 11.5–15.5)
WBC: 4.5 10*3/uL (ref 4.0–10.5)

## 2016-04-23 LAB — I-STAT CG4 LACTIC ACID, ED: Lactic Acid, Venous: 2.41 mmol/L (ref 0.5–2.0)

## 2016-04-23 MED ORDER — OLANZAPINE 5 MG PO TABS
5.0000 mg | ORAL_TABLET | Freq: Two times a day (BID) | ORAL | Status: DC
  Administered 2016-04-24 – 2016-04-26 (×5): 5 mg via ORAL
  Filled 2016-04-23 (×5): qty 1

## 2016-04-23 MED ORDER — DOXYCYCLINE HYCLATE 100 MG PO TABS
100.0000 mg | ORAL_TABLET | Freq: Two times a day (BID) | ORAL | Status: DC
  Administered 2016-04-23 – 2016-04-26 (×6): 100 mg via ORAL
  Filled 2016-04-23 (×6): qty 1

## 2016-04-23 MED ORDER — SODIUM CHLORIDE 0.9 % IV BOLUS (SEPSIS)
1000.0000 mL | Freq: Once | INTRAVENOUS | Status: AC
Start: 2016-04-23 — End: 2016-04-23
  Administered 2016-04-23: 1000 mL via INTRAVENOUS

## 2016-04-23 MED ORDER — DICLOFENAC SODIUM 75 MG PO TBEC
75.0000 mg | DELAYED_RELEASE_TABLET | Freq: Two times a day (BID) | ORAL | Status: DC | PRN
  Filled 2016-04-23: qty 1

## 2016-04-23 MED ORDER — IBUPROFEN 800 MG PO TABS
800.0000 mg | ORAL_TABLET | Freq: Once | ORAL | Status: AC
  Administered 2016-04-23: 800 mg via ORAL
  Filled 2016-04-23: qty 1

## 2016-04-23 MED ORDER — TRAZODONE HCL 50 MG PO TABS
300.0000 mg | ORAL_TABLET | Freq: Every evening | ORAL | Status: DC | PRN
  Administered 2016-04-23 – 2016-04-25 (×3): 300 mg via ORAL
  Filled 2016-04-23 (×3): qty 6

## 2016-04-23 MED ORDER — ACETAMINOPHEN 650 MG RE SUPP: 650.0000 mg | Freq: Four times a day (QID) | RECTAL | Status: DC | PRN

## 2016-04-23 MED ORDER — HYDROCODONE-ACETAMINOPHEN 5-325 MG PO TABS
1.0000 | ORAL_TABLET | ORAL | Status: DC | PRN
  Administered 2016-04-24 – 2016-04-26 (×5): 1 via ORAL
  Filled 2016-04-23 (×5): qty 1

## 2016-04-23 MED ORDER — ACETAMINOPHEN 325 MG PO TABS
650.0000 mg | ORAL_TABLET | Freq: Four times a day (QID) | ORAL | Status: DC | PRN
  Administered 2016-04-23 – 2016-04-26 (×4): 650 mg via ORAL
  Filled 2016-04-23 (×4): qty 2

## 2016-04-23 MED ORDER — GABAPENTIN 300 MG PO CAPS
300.0000 mg | ORAL_CAPSULE | Freq: Three times a day (TID) | ORAL | Status: DC | PRN
  Administered 2016-04-24 – 2016-04-25 (×2): 300 mg via ORAL
  Filled 2016-04-23 (×2): qty 1

## 2016-04-23 MED ORDER — PANTOPRAZOLE SODIUM 40 MG PO TBEC
80.0000 mg | DELAYED_RELEASE_TABLET | Freq: Every day | ORAL | Status: DC
  Administered 2016-04-23 – 2016-04-26 (×4): 80 mg via ORAL
  Filled 2016-04-23 (×5): qty 2

## 2016-04-23 MED ORDER — SODIUM CHLORIDE 0.9 % IV BOLUS (SEPSIS)
1000.0000 mL | Freq: Once | INTRAVENOUS | Status: AC
  Administered 2016-04-23: 1000 mL via INTRAVENOUS

## 2016-04-23 MED ORDER — GUAIFENESIN ER 600 MG PO TB12
600.0000 mg | ORAL_TABLET | Freq: Two times a day (BID) | ORAL | Status: DC
  Administered 2016-04-23 – 2016-04-26 (×6): 600 mg via ORAL
  Filled 2016-04-23 (×6): qty 1

## 2016-04-23 MED ORDER — SODIUM CHLORIDE 0.9 % IV BOLUS (SEPSIS)
500.0000 mL | Freq: Once | INTRAVENOUS | Status: AC
  Administered 2016-04-23: 500 mL via INTRAVENOUS

## 2016-04-23 MED ORDER — ALBUTEROL SULFATE (2.5 MG/3ML) 0.083% IN NEBU: 3.0000 mL | INHALATION_SOLUTION | Freq: Four times a day (QID) | RESPIRATORY_TRACT | Status: DC | PRN

## 2016-04-23 MED ORDER — ONDANSETRON HCL 4 MG PO TABS: 4.0000 mg | ORAL_TABLET | Freq: Three times a day (TID) | ORAL | Status: DC | PRN

## 2016-04-23 MED ORDER — LAMOTRIGINE 25 MG PO TABS
ORAL_TABLET | ORAL | Status: AC
  Filled 2016-04-23: qty 1

## 2016-04-23 MED ORDER — DICYCLOMINE HCL 10 MG PO CAPS
10.0000 mg | ORAL_CAPSULE | Freq: Three times a day (TID) | ORAL | Status: DC
  Administered 2016-04-23 – 2016-04-26 (×10): 10 mg via ORAL
  Filled 2016-04-23 (×10): qty 1

## 2016-04-23 MED ORDER — ENOXAPARIN SODIUM 40 MG/0.4ML ~~LOC~~ SOLN
40.0000 mg | SUBCUTANEOUS | Status: DC
  Administered 2016-04-24 – 2016-04-25 (×2): 40 mg via SUBCUTANEOUS
  Filled 2016-04-23 (×3): qty 0.4

## 2016-04-23 MED ORDER — POTASSIUM CHLORIDE IN NACL 40-0.9 MEQ/L-% IV SOLN
INTRAVENOUS | Status: DC
  Administered 2016-04-23 – 2016-04-25 (×5): 125 mL/h via INTRAVENOUS

## 2016-04-23 MED ORDER — IBUPROFEN 800 MG PO TABS
800.0000 mg | ORAL_TABLET | Freq: Once | ORAL | Status: AC
Start: 2016-04-23 — End: 2016-04-23
  Administered 2016-04-23: 800 mg via ORAL
  Filled 2016-04-23: qty 1

## 2016-04-23 MED ORDER — DULOXETINE HCL 30 MG PO CPEP
30.0000 mg | ORAL_CAPSULE | Freq: Every day | ORAL | Status: DC
  Administered 2016-04-23 – 2016-04-26 (×4): 30 mg via ORAL
  Filled 2016-04-23 (×4): qty 1

## 2016-04-23 MED ORDER — LAMOTRIGINE 25 MG PO TABS
25.0000 mg | ORAL_TABLET | Freq: Two times a day (BID) | ORAL | Status: DC
  Administered 2016-04-23 – 2016-04-26 (×6): 25 mg via ORAL
  Filled 2016-04-23 (×10): qty 1

## 2016-04-23 NOTE — ED Notes (Signed)
Hospitalist at bedside 

## 2016-04-23 NOTE — ED Notes (Signed)
Patient eating dinner tray. Voices no complaints at this time.

## 2016-04-23 NOTE — ED Notes (Signed)
Report given to nurse on Dept [address], all questions answered.  

## 2016-04-23 NOTE — H&P (Signed)
History and Physical  [name] [id number] DOB: Sep 13, 1971 DOA: 04/23/2016  Referring physician: Dr Alvino Chapel, ED physician PCP: Soyla Dryer, PA-C  Outpatient Specialists:   Ashok Cordia (Pulm)  Rourk (GI)  Bartle (CT surgery)  Chief Complaint: Fever, Cough  HPI: Shawn [name] is a 45 y.o. male with a history of bipolar 1, schizoaffective disorder, hyperlipidemia, hypertension, IgG4 related lung disease/nodular lung disease. Patient's lung disease is not improved with steroids and the patient is currently undergoing second opinion/review by [facility] pathology department. The patient presents complaining of fevers and cough that had been intermittent over the past 5 days. The symptoms have become worse. This morning she was completely fatigued, had cold chills, and fevers. He also had lack of appetite. His symptoms are worsening. No provoking or palliating factors. The patient was seen on 6/13 for similar complaints. He was given prednisone and cough suppressant. Additionally, the patient has had multiple tick bites to his lower legs which aren't varied stages of healing. Last tics were taken off approximately 2-3 days ago.   Review of Systems:   Pt denies any nausea, vomiting, diarrhea, constipation, abdominal pain, shortness of breath, dyspnea on exertion, orthopnea, wheezing, palpitations, headache, vision changes, lightheadedness, dizziness, melena, rectal bleeding.  Review of systems are otherwise negative  Past Medical History  Diagnosis Date  . Arthritis   . Back pain   . Bipolar 1 disorder ([place])   . GERD (gastroesophageal reflux disease)   . Diverticulitis   . Vomiting   . COPD (chronic obstructive pulmonary disease) ([place])   . Lung nodules   . Bipolar disorder ([place])   . Schizoaffective disorder, bipolar type ([place])   . Heart disease     "irregular heart beat"  . Anemia   . Hyperlipidemia   . Hypertension   . Family history of adverse reaction to  anesthesia     he was adopted  . 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 HOH (hard of hearing)     biological parents are both deaf.   left ear is good.  . 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 Brain surgery  2005    after head injury, patient states had 2 large blood clots evacuated  . Colonoscopy with esophagogastroduodenoscopy (egd) N/A 12/05/2013    [id number] erosive relfux/HH/melanosis coli/colonic diverticulosis. tubulovillous adenoma removed. next tcs 11/2016  . Flexible bronchoscopy N/A 01/23/2016    Procedure: FLEXIBLE BRONCHOSCOPY;  Surgeon: Gaye Pollack, MD;  Location: [facility] OR;  Service: Thoracic;  Laterality: N/A;  . Video assisted thoracoscopy Left 01/23/2016    Procedure: LEFT VIDEO ASSISTED THORACOSCOPY;  Surgeon: Gaye Pollack, MD;  Location: [facility] OR;  Service: Thoracic;  Laterality: Left;  . Lung biopsy N/A 01/23/2016    Procedure: LEFT LUNG BIOPSY;  Surgeon: Gaye Pollack, MD;  Location: [place];  Service: Thoracic;  Laterality: N/A;   Social History:  reports that he quit smoking about 21 months ago. His smoking use included Cigars and Cigarettes. He has a 18.75 pack-year smoking history. His smokeless tobacco use includes Snuff. He reports that he drinks alcohol. He reports that he does not use illicit drugs. Patient lives at Home  No Known Allergies  Family History  Problem Relation Age of Onset  . Adopted: Yes  . Colon cancer Neg Hx     adopted at 74 months old, unsure about GI history  of parents   . Alzheimer's disease Other   . Parkinson's disease Other   . Mental illness Other      Prior to Admission medications   Medication Sig Start Date End Date Taking? Authorizing Provider  albuterol (PROVENTIL HFA;VENTOLIN HFA) 108 (90 BASE) MCG/ACT inhaler Inhale 1-2 puffs into the lungs every 6 (six) hours as needed for wheezing or shortness of breath.   Yes Historical Provider, MD    cyclobenzaprine (FLEXERIL) 10 MG tablet Take 10 mg by mouth 3 (three) times daily as needed for muscle spasms.   Yes Historical Provider, MD  dexlansoprazole (DEXILANT) 60 MG capsule Take 1 capsule (60 mg total) by mouth daily. 07/03/15  Yes Mahala Menghini, PA-C  diclofenac (VOLTAREN) 75 MG EC tablet Take 75 mg by mouth 2 (two) times daily as needed for mild pain.    Yes Historical Provider, MD  dicyclomine (BENTYL) 10 MG capsule Take 1 capsule (10 mg total) by mouth 4 (four) times daily -  before meals and at bedtime. As needed for loose stools and abdominal pain 02/24/16  Yes Carlis Stable, NP  DULoxetine (CYMBALTA) 30 MG capsule Take 30 mg by mouth daily.   Yes Historical Provider, MD  gabapentin (NEURONTIN) 300 MG capsule Take 300 mg by mouth 3 (three) times daily as needed (for pain).    Yes Historical Provider, MD  HYDROcodone-acetaminophen (NORCO) 5-325 MG tablet Take 1 tablet by mouth every 4 (four) hours as needed for moderate pain (or cough). 0000000  Yes [name], MD  lamoTRIgine (LAMICTAL) 25 MG tablet Take 25 mg by mouth 2 (two) times daily.    Yes Historical Provider, MD  metoprolol tartrate (LOPRESSOR) 25 MG tablet Take 0.5 tablets (12.5 mg total) by mouth 2 (two) times daily. Patient taking differently: Take 25 mg by mouth 2 (two) times daily.  01/27/16  Yes Erin R Barrett, PA-C  Multiple Vitamin (MULTIVITAMIN) tablet Take 1 tablet by mouth daily.   Yes Historical Provider, MD  OLANZapine (ZYPREXA) 10 MG tablet Take 5 mg by mouth 2 (two) times daily after a meal.   Yes Historical Provider, MD  ondansetron (ZOFRAN) 4 MG tablet TAKE ONE TABLET BY MOUTH EVERY 8 HOURS AS NEEDED FOR NAUSEA OR VOMITING 04/20/16  Yes Carlis Stable, NP  predniSONE (DELTASONE) 50 MG tablet Take 1 tablet (50 mg total) by mouth daily. 0000000  Yes [name], MD  traZODone (DESYREL) 150 MG tablet Take 300 mg by mouth at bedtime as needed for sleep.    Yes Historical Provider, MD    Physical Exam: BP 81/51 mmHg   Pulse 80  Temp(Src) 99.3 F (37.4 C) (Oral)  Resp 18  Ht 5\' 11"  (1.803 m)  Wt 86.183 kg (190 lb)  BMI 26.51 kg/m2  SpO2 92%  General: Middle-aged Caucasian male. Awake and alert and oriented x3. No acute cardiopulmonary distress.  HEENT: Normocephalic atraumatic.  Right and left ears normal in appearance.  Pupils equal, round, reactive to light. Extraocular muscles are intact. Sclerae anicteric and noninjected.  Moist mucosal membranes. No mucosal lesions.  Neck: Neck supple without lymphadenopathy. No carotid bruits. No masses palpated.  Cardiovascular: Regular rate with normal S1-S2 sounds. No murmurs, rubs, gallops auscultated. No JVD.  Respiratory: Good respiratory effort with no wheezes, rales, rhonchi. Lungs clear to auscultation bilaterally.  No accessory muscle use. Abdomen: Soft, nontender, nondistended. Active bowel sounds. No masses or hepatosplenomegaly  Skin: No rashes or ulcerations.  Dry, warm to touch. 2+ dorsalis pedis  and radial pulses. Multiple tick bites to lower legs. Lytic bites in the right lower leg have mild erythema to them. No erythema migrans Musculoskeletal: No calf or leg pain. All major joints not erythematous nontender.  No upper or lower joint deformation.  Good ROM.  No contractures  Psychiatric: Intact judgment and insight. Pleasant and cooperative. Neurologic: No focal neurological deficits. Strength is 5/5 and symmetric in upper and lower extremities.  Cranial nerves II through XII are grossly intact.           Labs on Admission: I have personally reviewed following labs and imaging studies  CBC:  Recent Labs Lab 04/21/16 0012 04/23/16 1440  WBC 5.5 4.5  NEUTROABS 3.6 3.7  HGB 13.4 13.9  HCT 39.8 42.7  MCV 94.1 95.3  PLT 190 [id number]   Basic Metabolic Panel:  Recent Labs Lab 04/21/16 0012 04/23/16 1548  NA 140 137  K 3.7 3.3*  CL 105 103  CO2 28 27  GLUCOSE 101* 91  BUN 15 15  CREATININE 1.17 1.14  CALCIUM 8.5* 8.3*    GFR: Estimated Creatinine Clearance: 87.2 mL/min (by C-G formula based on Cr of 1.14). Liver Function Tests:  Recent Labs Lab 04/21/16 0012 04/23/16 1548  AST 22 43*  ALT 23 27  ALKPHOS 46 52  BILITOT 0.6 0.7  PROT 6.7 6.8  ALBUMIN 3.5 3.4*   No results for input(s): LIPASE, AMYLASE in the last 168 hours. No results for input(s): AMMONIA in the last 168 hours. Coagulation Profile: No results for input(s): INR, PROTIME in the last 168 hours. Cardiac Enzymes:  Recent Labs Lab 04/21/16 0012  TROPONINI <0.03   BNP (last 3 results) No results for input(s): PROBNP in the last 8760 hours. HbA1C: No results for input(s): HGBA1C in the last 72 hours. CBG: No results for input(s): GLUCAP in the last 168 hours. Lipid Profile: No results for input(s): CHOL, HDL, LDLCALC, TRIG, CHOLHDL, LDLDIRECT in the last 72 hours. Thyroid Function Tests: No results for input(s): TSH, T4TOTAL, FREET4, T3FREE, THYROIDAB in the last 72 hours. Anemia Panel: No results for input(s): VITAMINB12, FOLATE, FERRITIN, TIBC, IRON, RETICCTPCT in the last 72 hours. Urine analysis:    Component Value Date/Time   COLORURINE YELLOW 01/21/2016 1228   APPEARANCEUR CLEAR 01/21/2016 1228   LABSPEC 1.009 01/21/2016 1228   PHURINE 5.5 01/21/2016 1228   GLUCOSEU NEGATIVE 01/21/2016 1228   HGBUR NEGATIVE 01/21/2016 1228   BILIRUBINUR NEGATIVE 01/21/2016 1228   KETONESUR NEGATIVE 01/21/2016 1228   PROTEINUR NEGATIVE 01/21/2016 1228   UROBILINOGEN 0.2 09/29/2013 1412   NITRITE NEGATIVE 01/21/2016 1228   LEUKOCYTESUR MODERATE* 01/21/2016 1228   Sepsis Labs: @LABRCNTIP (procalcitonin:4,lacticidven:4) ) Recent Results (from the past 240 hour(s))  Rapid strep screen     Status: None   Collection Time: 04/23/16  2:43 PM  Result Value Ref Range Status   Streptococcus, Group A Screen (Direct) NEGATIVE NEGATIVE Final    Comment: (NOTE) A Rapid Antigen test may result negative if the antigen level in the sample  is below the detection level of this test. The FDA has not cleared this test as a stand-alone test therefore the rapid antigen negative result has reflexed to a Group A Strep culture.   Culture, blood (routine x 2)     Status: None (Preliminary result)   Collection Time: 04/23/16  3:00 PM  Result Value Ref Range Status   Specimen Description LEFT ANTECUBITAL  Final   Special Requests BOTTLES DRAWN AEROBIC AND ANAEROBIC [place]  Final   Culture PENDING  Incomplete   Report Status PENDING  Incomplete  Culture, blood (routine x 2)     Status: None (Preliminary result)   Collection Time: 04/23/16  3:10 PM  Result Value Ref Range Status   Specimen Description BLOOD RIGHT HAND  Final   Special Requests BOTTLES DRAWN AEROBIC ONLY 4CC  Final   Culture PENDING  Incomplete   Report Status PENDING  Incomplete     Radiological Exams on Admission: Dg Chest 2 View  04/23/2016  CLINICAL DATA:  Right-sided chest pain for 1 month. Cough for 6 months. EXAM: CHEST  2 VIEW COMPARISON:  April 21, 2016 FINDINGS: There is mild scarring with postoperative change left base. There is also scarring and postoperative change in the lingula. The lungs show chronic mild reticular interstitial prominence. There is no parenchymal lung edema or consolidation. Heart size and pulmonary vascularity are normal. No adenopathy. No bone lesions. No pneumothorax. IMPRESSION: Postoperative change with scarring on the left. No edema or consolidation. Chronic reticular interstitial prominence. No new opacity. No change in cardiac silhouette. Electronically Signed   By: Lowella Grip III M.D.   On: 04/23/2016 15:54     Assessment/Plan: Principal Problem:   Hypotension Active Problems:   Fever   Cough   Tick bites   Hypokalemia    This patient was discussed with the ED physician, including pertinent vitals, physical exam findings, labs, and imaging.  We also discussed care given by the ED provider.  #1 hypertension #2  fever #3 cough #4 tick bites #5 hyponatremia  Observe overnight given his hypotension.  Continue IV fluids with potassium  Recheck metabolic panel in morning as the patient has slight hyponatremia  Start doxycycline for both tick bites and lower respiratory infection per pulmonology recommendation  Tylenol for fevers  Lyme serologies obtained  If patient is stable tomorrow, would be reasonable to send home  DVT prophylaxis: Lovenox Consultants: None Code Status: Full code Family Communication: None  Disposition Plan: Observation   Truett Mainland, DO [facility] Pager [phone]  If 7PM-7AM, please contact night-coverage [email or web address] Password [email or web address]

## 2016-04-23 NOTE — ED Provider Notes (Signed)
CSN: [id number]     Arrival date & time 04/23/16  1255 History   First MD Initiated Contact with Patient 04/23/16 1401     Chief Complaint  Patient presents with  . Fever  . Cough      Patient is a 45 y.o. male presenting with fever and cough. The history is provided by the patient.  Fever Max temp prior to arrival:  102 Associated symptoms: cough and nausea   Associated symptoms: no chest pain and no vomiting   Cough Associated symptoms: fever   Associated symptoms: no chest pain   Patient presents with fever and cough. Seen a few days ago for the same. 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 States that the doctor that saw him a couple days ago was immediate. States he tried to start him back on the steroids when he has been tapered off his steroids. Slight nonproductive cough. Does have an occasional sharp headache on the right side of his head. No sore throat. No nasal congestion. States he has had some tick bites. No dysuria. No sore throat. [facility] is apparently rechecking the IgG4 deficiency before starting the stronger immunosuppression.,  Past Medical History  Diagnosis Date  . Arthritis   . Back pain   . Bipolar 1 disorder ([place])   . GERD (gastroesophageal reflux disease)   . Diverticulitis   . Vomiting   . COPD (chronic obstructive pulmonary disease) ([place])   . Lung nodules   . Bipolar disorder ([place])   . Schizoaffective disorder, bipolar type ([place])   . Heart disease     "irregular heart beat"  . Anemia   . Hyperlipidemia   . Hypertension   . Family history of adverse reaction to anesthesia     he was adopted  . 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 HOH (hard of hearing)     biological parents are both deaf.   left ear is good.  . 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 Brain surgery  2005    after head injury, patient states had 2 large blood clots evacuated  . Colonoscopy with  esophagogastroduodenoscopy (egd) N/A 12/05/2013    [id number] erosive relfux/HH/melanosis coli/colonic diverticulosis. tubulovillous adenoma removed. next tcs 11/2016  . Flexible bronchoscopy N/A 01/23/2016    Procedure: FLEXIBLE BRONCHOSCOPY;  Surgeon: Gaye Pollack, MD;  Location: [facility] OR;  Service: Thoracic;  Laterality: N/A;  . Video assisted thoracoscopy Left 01/23/2016    Procedure: LEFT VIDEO ASSISTED THORACOSCOPY;  Surgeon: Gaye Pollack, MD;  Location: [facility] OR;  Service: Thoracic;  Laterality: Left;  . Lung biopsy N/A 01/23/2016    Procedure: LEFT LUNG BIOPSY;  Surgeon: Gaye Pollack, MD;  Location: [facility] OR;  Service: Thoracic;  Laterality: N/A;   Family History  Problem Relation Age of Onset  . Adopted: Yes  . Colon cancer Neg Hx     adopted at 47 months old, unsure about GI history of parents   . Alzheimer's disease Other   . Parkinson's disease Other   . Mental illness Other    Social History  Substance Use Topics  . Smoking status: Former Smoker -- 0.75 packs/day for 25 years    Types: Cigars, Cigarettes    Quit date: 07/09/2014  . Smokeless tobacco: Current User    Types: Snuff     Comment: pt uses dip tobacco  . Alcohol  Use: 0.0 oz/week    0 Standard drinks or equivalent per week     Comment: occ    Review of Systems  Constitutional: Positive for fever and appetite change.  Respiratory: Positive for cough.   Cardiovascular: Negative for chest pain.  Gastrointestinal: Positive for nausea. Negative for vomiting, abdominal pain and constipation.  Genitourinary: Negative for flank pain and penile pain.  Musculoskeletal: Negative for back pain.  Skin: Negative for wound.  Neurological: Negative for weakness and numbness.  Hematological: Negative for adenopathy.      Allergies  Review of patient's allergies indicates no known allergies.  Home Medications   Prior to Admission medications   Medication Sig Start Date End Date Taking? Authorizing Provider  albuterol  (PROVENTIL HFA;VENTOLIN HFA) 108 (90 BASE) MCG/ACT inhaler Inhale 1-2 puffs into the lungs every 6 (six) hours as needed for wheezing or shortness of breath.   Yes Historical Provider, MD  cyclobenzaprine (FLEXERIL) 10 MG tablet Take 10 mg by mouth 3 (three) times daily as needed for muscle spasms.   Yes Historical Provider, MD  dexlansoprazole (DEXILANT) 60 MG capsule Take 1 capsule (60 mg total) by mouth daily. 07/03/15  Yes Mahala Menghini, PA-C  diclofenac (VOLTAREN) 75 MG EC tablet Take 75 mg by mouth 2 (two) times daily as needed for mild pain.    Yes Historical Provider, MD  dicyclomine (BENTYL) 10 MG capsule Take 1 capsule (10 mg total) by mouth 4 (four) times daily -  before meals and at bedtime. As needed for loose stools and abdominal pain 02/24/16  Yes Carlis Stable, NP  DULoxetine (CYMBALTA) 30 MG capsule Take 30 mg by mouth daily.   Yes Historical Provider, MD  gabapentin (NEURONTIN) 300 MG capsule Take 300 mg by mouth 3 (three) times daily as needed (for pain).    Yes Historical Provider, MD  HYDROcodone-acetaminophen (NORCO) 5-325 MG tablet Take 1 tablet by mouth every 4 (four) hours as needed for moderate pain (or cough). 0000000  Yes [name], MD  lamoTRIgine (LAMICTAL) 25 MG tablet Take 25 mg by mouth 2 (two) times daily.    Yes Historical Provider, MD  metoprolol tartrate (LOPRESSOR) 25 MG tablet Take 0.5 tablets (12.5 mg total) by mouth 2 (two) times daily. Patient taking differently: Take 25 mg by mouth 2 (two) times daily.  01/27/16  Yes Erin R Barrett, PA-C  Multiple Vitamin (MULTIVITAMIN) tablet Take 1 tablet by mouth daily.   Yes Historical Provider, MD  OLANZapine (ZYPREXA) 10 MG tablet Take 5 mg by mouth 2 (two) times daily after a meal.   Yes Historical Provider, MD  ondansetron (ZOFRAN) 4 MG tablet TAKE ONE TABLET BY MOUTH EVERY 8 HOURS AS NEEDED FOR NAUSEA OR VOMITING 04/20/16  Yes Carlis Stable, NP  predniSONE (DELTASONE) 50 MG tablet Take 1 tablet (50 mg total) by mouth  daily. 0000000  Yes [name], MD  traZODone (DESYREL) 150 MG tablet Take 300 mg by mouth at bedtime as needed for sleep.    Yes Historical Provider, MD  naproxen (NAPROSYN) 500 MG tablet Take 1 tablet (500 mg total) by mouth 2 (two) times daily. Patient not taking: Reported on 04/23/2016 01/31/16   Tanna Furry, MD  oxyCODONE-acetaminophen (PERCOCET/ROXICET) 5-325 MG tablet Take 2 tablets by mouth every 4 (four) hours as needed. Patient not taking: Reported on 04/23/2016 01/31/16   Tanna Furry, MD  traMADol (ULTRAM) 50 MG tablet Take 1-2 tablets (50-100 mg total) by mouth every 6 (six) hours as  needed (mild pain). Patient not taking: Reported on 04/23/2016 01/27/16   Erin R Barrett, PA-C   BP 106/68 mmHg  Pulse 76  Temp(Src) 99.3 F (37.4 C) (Oral)  Resp 18  Ht 5\' 11"  (1.803 m)  Wt 190 lb (86.183 kg)  BMI 26.51 kg/m2  SpO2 92% Physical Exam  Constitutional: He appears well-developed.  HENT:  Head: Atraumatic.  Eyes: EOM are normal.  Neck: Neck supple.  Cardiovascular: Normal rate.   Pulmonary/Chest: Effort normal.  Slightly harsh breath sounds without wheezes.  Neurological: He is alert.  Skin:  Few areas of scabbing and leg and arm. There is  some erythema around the area on the area of his right posterior knee. No fluctuance.     ED Course  Procedures (including critical care time) Labs Review Labs Reviewed  CBC WITH DIFFERENTIAL/PLATELET - Abnormal; Notable for the following:    Lymphs Abs 0.6 (*)    All other components within normal limits  COMPREHENSIVE METABOLIC PANEL - Abnormal; Notable for the following:    Potassium 3.3 (*)    Calcium 8.3 (*)    Albumin 3.4 (*)    AST 43 (*)    All other components within normal limits  I-STAT CG4 LACTIC ACID, ED - Abnormal; Notable for the following:    Lactic Acid, Venous 2.41 (*)    All other components within normal limits  RAPID STREP SCREEN (NOT AT [facility])  CULTURE, BLOOD (ROUTINE X 2)  CULTURE, BLOOD (ROUTINE X 2)  CULTURE,  GROUP A STREP ([place])  INFLUENZA PANEL BY PCR (TYPE A & B, H1N1)    Imaging Review Dg Chest 2 View  04/23/2016  CLINICAL DATA:  Right-sided chest pain for 1 month. Cough for 6 months. EXAM: CHEST  2 VIEW COMPARISON:  April 21, 2016 FINDINGS: There is mild scarring with postoperative change left base. There is also scarring and postoperative change in the lingula. The lungs show chronic mild reticular interstitial prominence. There is no parenchymal lung edema or consolidation. Heart size and pulmonary vascularity are normal. No adenopathy. No bone lesions. No pneumothorax. IMPRESSION: Postoperative change with scarring on the left. No edema or consolidation. Chronic reticular interstitial prominence. No new opacity. No change in cardiac silhouette. Electronically Signed   By: Lowella Grip III M.D.   On: 04/23/2016 15:54   I have personally reviewed and evaluated these images and lab results as part of my medical decision-making.   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 Cough. Seen a few days Ago for the same but fevers and is high at that time. No fever up to 103. X-ray reassuring. Has a history chronic lung disease and possible/likely IgG4 deficiency. Had been on steroids previously but not that recently. Sharp pain to right side of head but no meningismus. Lab work overall reassuring but lactic acid mildly elevated at 2.4. Discussed with Dr. Halford Chessman from pulmonary. Has had tick bites and does have slight erythema behind his knees somewhat near the site of one of the bites. Tickborne illness considered to patient's blood pressure has been running around [id number] systolic. States his blood pressures normally 120/75 but reviewing records it does show some pressures in the 90s 100s and 120s. At this point he continues to be febrile. Blood culture sent. With the marginal blood pressure will admit  to hospital for observation.    Davonna Belling,  MD 04/23/16 1805

## 2016-04-23 NOTE — ED Notes (Signed)
Pt reports cough for the past 6 months.  Reports started having fever Sunday and came to the hospital.   PT says he has a lung disease called  IGG4.  Pt says still has fever today 102.9 prior to arrival.

## 2016-04-23 NOTE — Progress Notes (Signed)
Nurse paged Walden Field to report Temp of 101.9. Donnal Debar is aware of patient having Tylenol at 2122 for shooting pain in right side of head. Lynch ordered one time dose of Ibuprofen 800mg  for temperature.

## 2016-04-23 NOTE — ED Notes (Signed)
Patient requesting something to eat.  Dr. Alvino Chapel advised it was okay for patient to eat. Patient refused meal tray - parents are coming to the E.R. To bring him food.

## 2016-04-23 NOTE — ED Notes (Signed)
Patient oxygen 93% on RM. Placed patient on 2 LPM. Sats increased to 96%.

## 2016-04-23 NOTE — Progress Notes (Signed)
Patient complaining of shooting pain in mid-right side of head. Patient explained reporting pain while in ED. Patient requested Tylenol for pain in head.

## 2016-04-24 ENCOUNTER — Encounter (HOSPITAL_COMMUNITY): Payer: Self-pay

## 2016-04-24 LAB — BASIC METABOLIC PANEL
Anion gap: 3 — ABNORMAL LOW (ref 5–15)
BUN: 12 mg/dL (ref 6–20)
CALCIUM: 8.1 mg/dL — AB (ref 8.9–10.3)
CHLORIDE: 109 mmol/L (ref 101–111)
CO2: 27 mmol/L (ref 22–32)
CREATININE: 0.98 mg/dL (ref 0.61–1.24)
GFR calc non Af Amer: >60 mL/min (ref >60)
GLUCOSE: 94 mg/dL (ref 65–99)
Potassium: 4.3 mmol/L (ref 3.5–5.1)
Sodium: 139 mmol/L (ref 135–145)

## 2016-04-24 NOTE — Progress Notes (Signed)
Cooling blanket applied. Rectal probe in place. Patients temperature 102.2, temp taken with rectal probe included with cooling blanket set.

## 2016-04-24 NOTE — Progress Notes (Signed)
Patient disconnected IV twice today.  Patient educated on not touching the IV pump and IV tubing.  Patient reeducated several times and verbalized understanding.  Unable to restart IV.  AC notifed and will attempt IV insertion this evening.  Patient demanding xray of ribs on right and CT of head for sharp head pain on top right of head.  Dr. Aggie Moats notified via text page.

## 2016-04-24 NOTE — Progress Notes (Signed)
Patient lalert and oriented and refusing cooling blanket.  Patient's temperature 102.2 with Tylenol given.  Dr. Aggie Moats notified via text page.  Dr. Aggie Moats returned page and gave order to discontinue cooling blanket.  Patient also reports blood in urine - Dr. Aggie Moats notified of patient report of blood in urine.

## 2016-04-24 NOTE — Progress Notes (Signed)
Nurse paged Walden Field to report temp of 102.9 orally. Lynch ordered cooling blanket. Nurse called Dallas Schimke to get cooling blanket.

## 2016-04-25 ENCOUNTER — Observation Stay (HOSPITAL_COMMUNITY): Payer: Medicaid Other

## 2016-04-25 DIAGNOSIS — R319 Hematuria, unspecified: Secondary | ICD-10-CM | POA: Diagnosis present

## 2016-04-25 DIAGNOSIS — E785 Hyperlipidemia, unspecified: Secondary | ICD-10-CM | POA: Diagnosis present

## 2016-04-25 DIAGNOSIS — R509 Fever, unspecified: Secondary | ICD-10-CM | POA: Diagnosis present

## 2016-04-25 DIAGNOSIS — T148 Other injury of unspecified body region: Secondary | ICD-10-CM

## 2016-04-25 DIAGNOSIS — I10 Essential (primary) hypertension: Secondary | ICD-10-CM | POA: Diagnosis present

## 2016-04-25 DIAGNOSIS — E871 Hypo-osmolality and hyponatremia: Secondary | ICD-10-CM | POA: Diagnosis present

## 2016-04-25 DIAGNOSIS — E876 Hypokalemia: Secondary | ICD-10-CM | POA: Diagnosis present

## 2016-04-25 DIAGNOSIS — H9190 Unspecified hearing loss, unspecified ear: Secondary | ICD-10-CM | POA: Diagnosis present

## 2016-04-25 DIAGNOSIS — J449 Chronic obstructive pulmonary disease, unspecified: Secondary | ICD-10-CM | POA: Diagnosis present

## 2016-04-25 DIAGNOSIS — K210 Gastro-esophageal reflux disease with esophagitis: Secondary | ICD-10-CM | POA: Diagnosis present

## 2016-04-25 DIAGNOSIS — W57XXXA Bitten or stung by nonvenomous insect and other nonvenomous arthropods, initial encounter: Secondary | ICD-10-CM

## 2016-04-25 DIAGNOSIS — F17290 Nicotine dependence, other tobacco product, uncomplicated: Secondary | ICD-10-CM | POA: Diagnosis present

## 2016-04-25 DIAGNOSIS — Z820 Family history of epilepsy and other diseases of the nervous system: Secondary | ICD-10-CM | POA: Diagnosis not present

## 2016-04-25 DIAGNOSIS — F250 Schizoaffective disorder, bipolar type: Secondary | ICD-10-CM | POA: Diagnosis present

## 2016-04-25 DIAGNOSIS — I959 Hypotension, unspecified: Secondary | ICD-10-CM | POA: Diagnosis present

## 2016-04-25 DIAGNOSIS — Z79899 Other long term (current) drug therapy: Secondary | ICD-10-CM | POA: Diagnosis not present

## 2016-04-25 LAB — CBC WITH DIFFERENTIAL/PLATELET
BASOS PCT: 3 %
Basophils Absolute: 0.1 10*3/uL (ref 0.0–0.1)
EOS ABS: 0.1 10*3/uL (ref 0.0–0.7)
EOS PCT: 2 %
HCT: 37.6 % — ABNORMAL LOW (ref 39.0–52.0)
HEMOGLOBIN: 13.1 g/dL (ref 13.0–17.0)
Lymphocytes Relative: 49 %
Lymphs Abs: 1.5 10*3/uL (ref 0.7–4.0)
MCH: 32 pg (ref 26.0–34.0)
MCHC: 34.8 g/dL (ref 30.0–36.0)
MCV: 91.9 fL (ref 78.0–100.0)
MONO ABS: 0.3 10*3/uL (ref 0.1–1.0)
MONOS PCT: 11 %
NEUTROS PCT: 36 %
Neutro Abs: 1.1 10*3/uL — ABNORMAL LOW (ref 1.7–7.7)
PLATELETS: 97 10*3/uL — AB (ref 150–400)
RBC: 4.09 MIL/uL — ABNORMAL LOW (ref 4.22–5.81)
RDW: 14 % (ref 11.5–15.5)
WBC: 3.1 10*3/uL — ABNORMAL LOW (ref 4.0–10.5)

## 2016-04-25 LAB — COMPREHENSIVE METABOLIC PANEL
ALBUMIN: 3 g/dL — AB (ref 3.5–5.0)
ALT: 34 U/L (ref 17–63)
ANION GAP: 4 — AB (ref 5–15)
AST: 49 U/L — ABNORMAL HIGH (ref 15–41)
Alkaline Phosphatase: 51 U/L (ref 38–126)
BUN: 6 mg/dL (ref 6–20)
CO2: 24 mmol/L (ref 22–32)
Calcium: 8.4 mg/dL — ABNORMAL LOW (ref 8.9–10.3)
Chloride: 107 mmol/L (ref 101–111)
Creatinine, Ser: 0.84 mg/dL (ref 0.61–1.24)
GFR calc Af Amer: >60 mL/min (ref >60)
GFR calc non Af Amer: >60 mL/min (ref >60)
GLUCOSE: 107 mg/dL — AB (ref 65–99)
POTASSIUM: 4 mmol/L (ref 3.5–5.1)
SODIUM: 135 mmol/L (ref 135–145)
Total Bilirubin: 0.3 mg/dL (ref 0.3–1.2)
Total Protein: 6.2 g/dL — ABNORMAL LOW (ref 6.5–8.1)

## 2016-04-25 LAB — URINALYSIS, ROUTINE W REFLEX MICROSCOPIC
Bilirubin Urine: NEGATIVE
GLUCOSE, UA: NEGATIVE mg/dL
HGB URINE DIPSTICK: NEGATIVE
KETONES UR: NEGATIVE mg/dL
LEUKOCYTES UA: NEGATIVE
NITRITE: NEGATIVE
PH: 6 (ref 5.0–8.0)
PROTEIN: NEGATIVE mg/dL
Specific Gravity, Urine: <1.005 — ABNORMAL LOW (ref 1.005–1.030)

## 2016-04-25 NOTE — Progress Notes (Signed)
[facility] PROGRESS NOTE  Shawn [name] [id number] DOB: 01-07-1971 DOA: 04/23/2016 PCP: Soyla Dryer, PA-C  Assessment/Plan: #1 hypotension - resolved #2 fever #3 cough #4 tick bites #5 hyponatremia - resolved  Observe overnight given his prolonged course of fever. Fever curve on downward course reflecting likely response to doxy and proper identification of cause of fever.  Fluids off  Recheck metabolic panel in morning  Tylenol for fevers  Lyme serologies obtained, results pending  CXR neg, blood cult neg, UA neg (OP f/u)  Plan is D/C in AM  DVT prophylaxis: Lovenox Consultants: None Code Status: Full code Family Communication: None  Disposition Plan: Observation  Consultants:  none  Procedures:  none  Antibiotics:  Doxy (6/16) (indicate start date, and stop date if known)  HPI/Subjective: Pt doing well. States chills resolved. No events ofvernight. Req CXR due to onset of R chest discomfort in lower ribs. Worried about poss pna. Also had hematuria briefly.  Objective: Filed Vitals:   04/24/16 2214 04/25/16 0551  BP: 102/48 140/70  Pulse: 80 80  Temp: 99.4 F (37.4 C) 101.4 F (38.6 C)  Resp: 20 20    Intake/Output Summary (Last 24 hours) at 04/25/16 1046 Last data filed at 04/25/16 0622  Gross per 24 hour  Intake 4354.58 ml  Output    800 ml  Net 3554.58 ml   Filed Weights   04/23/16 1305 04/23/16 2052  Weight: 86.183 kg (190 lb) 89.585 kg (197 lb 8 oz)    Exam:  General:  No diaphoresis, anxious, NAD Cardiovascular: Regular rate and rhythm no murmurs rubs or gallops Respiratory: Clear to auscultation bilaterally no more breathing Abdomen: Nondistended bowel sounds normal nontender palpation Musculoskeletal: Moving all extremities, no deformity, 5 out of 5 strength  GU - deferred  Data Reviewed: Basic Metabolic Panel:  Recent Labs Lab 04/21/16 0012 04/23/16 1548 04/24/16 0608  NA 140 137 139  K 3.7 3.3* 4.3  CL  105 103 109  CO2 28 27 27   GLUCOSE 101* 91 94  BUN 15 15 12   CREATININE 1.17 1.14 0.98  CALCIUM 8.5* 8.3* 8.1*   Liver Function Tests:  Recent Labs Lab 04/21/16 0012 04/23/16 1548  AST 22 43*  ALT 23 27  ALKPHOS 46 52  BILITOT 0.6 0.7  PROT 6.7 6.8  ALBUMIN 3.5 3.4*   No results for input(s): LIPASE, AMYLASE in the last 168 hours. No results for input(s): AMMONIA in the last 168 hours. CBC:  Recent Labs Lab 04/21/16 0012 04/23/16 1440  WBC 5.5 4.5  NEUTROABS 3.6 3.7  HGB 13.4 13.9  HCT 39.8 42.7  MCV 94.1 95.3  PLT 190 171   Cardiac Enzymes:  Recent Labs Lab 04/21/16 0012  TROPONINI <0.03   BNP (last 3 results)  Recent Labs  04/21/16 0012  BNP 27.0    ProBNP (last 3 results) No results for input(s): PROBNP in the last 8760 hours.  CBG: No results for input(s): GLUCAP in the last 168 hours.  Recent Results (from the past 240 hour(s))  Rapid strep screen     Status: None   Collection Time: 04/23/16  2:43 PM  Result Value Ref Range Status   Streptococcus, Group A Screen (Direct) NEGATIVE NEGATIVE Final    Comment: (NOTE) A Rapid Antigen test may result negative if the antigen level in the sample is below the detection level of this test. The FDA has not cleared this test as a stand-alone test therefore the rapid antigen negative result has reflexed  to a Group A Strep culture.   Culture, group A strep     Status: None (Preliminary result)   Collection Time: 04/23/16  2:43 PM  Result Value Ref Range Status   Specimen Description THROAT  Final   Special Requests NONE Reflexed from [id number]  Final   Culture   Final    TOO YOUNG TO READ Performed at [facility]    Report Status PENDING  Incomplete  Culture, blood (routine x 2)     Status: None (Preliminary result)   Collection Time: 04/23/16  3:00 PM  Result Value Ref Range Status   Specimen Description LEFT ANTECUBITAL  Final   Special Requests BOTTLES DRAWN AEROBIC AND ANAEROBIC 10CC  EACH  Final   Culture NO GROWTH < 24 HOURS  Final   Report Status PENDING  Incomplete  Culture, blood (routine x 2)     Status: None (Preliminary result)   Collection Time: 04/23/16  3:10 PM  Result Value Ref Range Status   Specimen Description BLOOD RIGHT HAND  Final   Special Requests BOTTLES DRAWN AEROBIC ONLY 4CC  Final   Culture NO GROWTH < 24 HOURS  Final   Report Status PENDING  Incomplete     Studies: Dg Chest 2 View  04/23/2016  CLINICAL DATA:  Right-sided chest pain for 1 month. Cough for 6 months. EXAM: CHEST  2 VIEW COMPARISON:  April 21, 2016 FINDINGS: There is mild scarring with postoperative change left base. There is also scarring and postoperative change in the lingula. The lungs show chronic mild reticular interstitial prominence. There is no parenchymal lung edema or consolidation. Heart size and pulmonary vascularity are normal. No adenopathy. No bone lesions. No pneumothorax. IMPRESSION: Postoperative change with scarring on the left. No edema or consolidation. Chronic reticular interstitial prominence. No new opacity. No change in cardiac silhouette. Electronically Signed   By: Lowella Grip III M.D.   On: 04/23/2016 15:54    Scheduled Meds: . dicyclomine  10 mg Oral TID AC & HS  . doxycycline  100 mg Oral Q12H  . DULoxetine  30 mg Oral Daily  . enoxaparin (LOVENOX) injection  40 mg Subcutaneous Q24H  . guaiFENesin  600 mg Oral BID  . lamoTRIgine  25 mg Oral BID  . OLANZapine  5 mg Oral BID PC  . pantoprazole  80 mg Oral Daily   Continuous Infusions: . 0.9 % NaCl with KCl 40 mEq / L 125 mL/hr (04/25/16 0420)    Principal Problem:   Hypotension Active Problems:   Fever   Cough   Tick bites   Hypokalemia    Time spent: 26min    Shawn [name]  [facility] Pager [phone]. If 7PM-7AM, please contact night-coverage at [email or web address], password [facility] 04/25/2016, 10:46 AM

## 2016-04-26 ENCOUNTER — Other Ambulatory Visit: Payer: Self-pay | Admitting: Physician Assistant

## 2016-04-26 LAB — HEPATIC FUNCTION PANEL
ALK PHOS: 52 U/L (ref 38–126)
ALT: 48 U/L (ref 17–63)
AST: 60 U/L — ABNORMAL HIGH (ref 15–41)
Albumin: 3.4 g/dL — ABNORMAL LOW (ref 3.5–5.0)
BILIRUBIN INDIRECT: 0.7 mg/dL (ref 0.3–0.9)
Bilirubin, Direct: 0.2 mg/dL (ref 0.1–0.5)
TOTAL PROTEIN: 6.7 g/dL (ref 6.5–8.1)
Total Bilirubin: 0.9 mg/dL (ref 0.3–1.2)

## 2016-04-26 LAB — CBC WITH DIFFERENTIAL/PLATELET
BASOS ABS: 0.1 10*3/uL (ref 0.0–0.1)
Basophils Relative: 2 %
EOS PCT: 3 %
Eosinophils Absolute: 0.1 10*3/uL (ref 0.0–0.7)
HCT: 41.1 % (ref 39.0–52.0)
HEMOGLOBIN: 14.3 g/dL (ref 13.0–17.0)
LYMPHS PCT: 50 %
Lymphs Abs: 1.9 10*3/uL (ref 0.7–4.0)
MCH: 31.8 pg (ref 26.0–34.0)
MCHC: 34.8 g/dL (ref 30.0–36.0)
MCV: 91.5 fL (ref 78.0–100.0)
Monocytes Absolute: 0.5 10*3/uL (ref 0.1–1.0)
Monocytes Relative: 13 %
NEUTROS PCT: 33 %
Neutro Abs: 1.3 10*3/uL — ABNORMAL LOW (ref 1.7–7.7)
PLATELETS: 116 10*3/uL — AB (ref 150–400)
RBC: 4.49 MIL/uL (ref 4.22–5.81)
RDW: 14 % (ref 11.5–15.5)
WBC: 3.8 10*3/uL — AB (ref 4.0–10.5)

## 2016-04-26 LAB — BASIC METABOLIC PANEL
Anion gap: 6 (ref 5–15)
BUN: 6 mg/dL (ref 6–20)
CALCIUM: 8.9 mg/dL (ref 8.9–10.3)
CHLORIDE: 102 mmol/L (ref 101–111)
CO2: 29 mmol/L (ref 22–32)
CREATININE: 0.88 mg/dL (ref 0.61–1.24)
Glucose, Bld: 95 mg/dL (ref 65–99)
Potassium: 4.3 mmol/L (ref 3.5–5.1)
SODIUM: 137 mmol/L (ref 135–145)

## 2016-04-26 LAB — CULTURE, GROUP A STREP (THRC)

## 2016-04-26 LAB — PROTIME-INR
INR: 1.13 (ref 0.00–1.49)
PROTHROMBIN TIME: 14.7 s (ref 11.6–15.2)

## 2016-04-26 LAB — APTT: APTT: 36 s (ref 24–37)

## 2016-04-26 MED ORDER — DOXYCYCLINE HYCLATE 100 MG PO TABS: 100.0000 mg | ORAL_TABLET | Freq: Two times a day (BID) | ORAL | Status: DC

## 2016-04-26 NOTE — Progress Notes (Signed)
Pt's IV catheter removed and intact. Pt's IV site clean dry and intact. Discharge instructions including medications and follow up appointments were reviewed and discussed with patient. Pt verbalized understanding of discharge instructions including medications and follow up appointments. All questions were answered and no further questions at this time. Pt in stable condition and in no acute distress at this time. Pt escorted by nurse tech.

## 2016-04-26 NOTE — Care Management Note (Addendum)
Case Management Note  Patient Details  Name: Shawn [name] MRN: [id number] Date of Birth: [birth year]  Subjective/Objective:  Patient is independent with ADL's. He lives with his parents. He goes to the [employer] in [place] and has medicaid.    Action/Plan: Patient will need lab work done at [facility] on Friday, June 23rd per Dr Aggie Moats. Spoke with nurse at [facility], orders for [facility] and CBC to be ordered by PA at [facility]. Patient made aware. I have also made an appt for him to follow up with free clinic on Monday, 05-03-16 at 1:15. Patient made aware. No other CM needs.    Expected Discharge Date:                  Expected Discharge Plan:  Home/Self Care  In-House Referral:     Discharge planning Services  CM Consult  Post Acute Care Choice:    Choice offered to:     DME Arranged:    DME Agency:     HH Arranged:    Wing Agency:     Status of Service:  Completed, signed off  Medicare Important Message Given:    Date Medicare IM Given:    Medicare IM give by:    Date Additional Medicare IM Given:    Additional Medicare Important Message give by:     If discussed at [place] of Stay Meetings, dates discussed:    Additional Comments:  Teila Skalsky, Chauncey Reading, RN 04/26/2016, 1:41 PM

## 2016-04-26 NOTE — Discharge Summary (Signed)
Physician Discharge Summary  Shawn [name] [id number] DOB: 1971/01/01 DOA: 04/23/2016  PCP: Soyla Dryer, PA-C  Admit date: 04/23/2016 Discharge date: 04/26/2016  Time spent: 30 minutes  Recommendations for Outpatient Follow-up:  1. OP f/u with local physician, needs repat CBC and CMP  Discharge Diagnoses:  Principal Problem:   Hypotension Active Problems:   Fever   Cough   Tick bites   Hypokalemia   Discharge Condition: stable  Diet recommendation: regular  Filed Weights   04/23/16 1305 04/23/16 2052  Weight: 86.183 kg (190 lb) 89.585 kg (197 lb 8 oz)    History of present illness:  Shawn [name] is a 45 y.o. male with a history of bipolar 1, schizoaffective disorder, hyperlipidemia, hypertension, IgG4 related lung disease/nodular lung disease. Patient's lung disease is not improved with steroids and the patient is currently undergoing second opinion/review by [facility] pathology department. The patient presents complaining of fevers and cough that had been intermittent over the past 5 days. The symptoms have become worse. This morning she was completely fatigued, had cold chills, and fevers. He also had lack of appetite. His symptoms are worsening. No provoking or palliating factors. The patient was seen on 6/13 for similar complaints. He was given prednisone and cough suppressant. Additionally, the patient has had multiple tick bites to his lower legs which aren't varied stages of healing. Last tics were taken off approximately 2-3 days ago.  Hospital Course:   #1 hypotension - resolved with fluids #2 fever - Observed for extended time due to prolonged course of fever. ID was contacted and their was questioning fo diagnosis due to slow response to abx.  Fever curve on downward course reflecting likely response to doxy and proper identification of cause of fever. For a time pt req sch antipyretics and cooling blanket and ice to control his fever. #3 cough - cough  meds effective. CXR neg, blood cult neg #4 tick bites - Lyme serologies obtained, results pending #5 hyponatremia - resolved #6 hematuria - likely due to low plts and lovenox, only one instance, Ua neg   Procedures:  none(i.e. Studies not automatically included, echos, thoracentesis, etc; not x-rays)  Consultations:  none  Discharge Exam: Filed Vitals:   04/25/16 2232 04/26/16 0639  BP: 110/82 110/67  Pulse: 64 58  Temp: 98.7 F (37.1 C) 97.9 F (36.6 C)  Resp: 20      General:  No diaphoresis, anxious, no acute distress  Cardiovascular: Regular rate and rhythm no murmurs rubs or gallops  Respiratory: Clear to auscultation bilaterally no more breathing  Abdomen: Nondistended bowel sounds normal nontender palpation  Musculoskeletal: Moving all extremities, no deformity, 5 out of 5 strength   Discharge Instructions    Current Discharge Medication List    CONTINUE these medications which have NOT CHANGED   Details  albuterol (PROVENTIL HFA;VENTOLIN HFA) 108 (90 BASE) MCG/ACT inhaler Inhale 1-2 puffs into the lungs every 6 (six) hours as needed for wheezing or shortness of breath.    cyclobenzaprine (FLEXERIL) 10 MG tablet Take 10 mg by mouth 3 (three) times daily as needed for muscle spasms.    dexlansoprazole (DEXILANT) 60 MG capsule Take 1 capsule (60 mg total) by mouth daily. Qty: 20 capsule, Refills: 0    diclofenac (VOLTAREN) 75 MG EC tablet Take 75 mg by mouth 2 (two) times daily as needed for mild pain.     dicyclomine (BENTYL) 10 MG capsule Take 1 capsule (10 mg total) by mouth 4 (four) times daily -  before meals and at bedtime. As needed for loose stools and abdominal pain Qty: 120 capsule, Refills: 2   Associated Diagnoses: Gastroesophageal reflux disease with esophagitis; Non-intractable vomiting with nausea, unspecified vomiting type; Loose stools    DULoxetine (CYMBALTA) 30 MG capsule Take 30 mg by mouth daily.    gabapentin (NEURONTIN) 300 MG  capsule Take 300 mg by mouth 3 (three) times daily as needed (for pain).     HYDROcodone-acetaminophen (NORCO) 5-325 MG tablet Take 1 tablet by mouth every 4 (four) hours as needed for moderate pain (or cough). Qty: 10 tablet, Refills: 0    lamoTRIgine (LAMICTAL) 25 MG tablet Take 25 mg by mouth 2 (two) times daily.     metoprolol tartrate (LOPRESSOR) 25 MG tablet Take 0.5 tablets (12.5 mg total) by mouth 2 (two) times daily.    Multiple Vitamin (MULTIVITAMIN) tablet Take 1 tablet by mouth daily.    OLANZapine (ZYPREXA) 10 MG tablet Take 5 mg by mouth 2 (two) times daily after a meal.    ondansetron (ZOFRAN) 4 MG tablet TAKE ONE TABLET BY MOUTH EVERY 8 HOURS AS NEEDED FOR NAUSEA OR VOMITING Qty: 30 tablet, Refills: 1    predniSONE (DELTASONE) 50 MG tablet Take 1 tablet (50 mg total) by mouth daily. Qty: 5 tablet, Refills: 0    traZODone (DESYREL) 150 MG tablet Take 300 mg by mouth at bedtime as needed for sleep.        No Known Allergies    The results of significant diagnostics from this hospitalization (including imaging, microbiology, ancillary and laboratory) are listed below for reference.    Significant Diagnostic Studies: Dg Chest 2 View  04/25/2016  CLINICAL DATA:  Right-sided chest pain and anterior rib pain. EXAM: CHEST  2 VIEW COMPARISON:  04/23/2016 FINDINGS: Cardiomediastinal silhouette is normal. Mediastinal contours appear intact. There is a stable tenting of the left hemidiaphragm, and linear opacity within the lingula, thought to represent postsurgical finding. There is no evidence of focal airspace consolidation, pleural effusion or pneumothorax. Stable coarsening of the interstitial markings is seen. Osseous structures are without acute abnormality. Soft tissues are grossly normal. IMPRESSION: Stable linear opacity within the lingula and tenting of the left hemidiaphragm. These findings are favored to represent postsurgical changes and are stable. Chronic coarsening  of the interstitial markings, without evidence of focal airspace consolidation. Electronically Signed   By: Fidela Salisbury M.D.   On: 04/25/2016 14:50   Dg Chest 2 View  04/23/2016  CLINICAL DATA:  Right-sided chest pain for 1 month. Cough for 6 months. EXAM: CHEST  2 VIEW COMPARISON:  April 21, 2016 FINDINGS: There is mild scarring with postoperative change left base. There is also scarring and postoperative change in the lingula. The lungs show chronic mild reticular interstitial prominence. There is no parenchymal lung edema or consolidation. Heart size and pulmonary vascularity are normal. No adenopathy. No bone lesions. No pneumothorax. IMPRESSION: Postoperative change with scarring on the left. No edema or consolidation. Chronic reticular interstitial prominence. No new opacity. No change in cardiac silhouette. Electronically Signed   By: Lowella Grip III M.D.   On: 04/23/2016 15:54   Dg Chest 2 View  04/21/2016  CLINICAL DATA:  Cough and shortness of breath. Nausea, vomiting, fever, chills, chest tightness. Recent lung biopsy. EXAM: CHEST  2 VIEW COMPARISON:  Chest 02/20/2016.  CT chest 02/20/2016. FINDINGS: Normal heart size and pulmonary vascularity. Diffuse nodular interstitial pattern to the lungs. Postoperative changes in the left lung base. No blunting of  costophrenic angles. No pneumothorax. No focal consolidation. IMPRESSION: Diffuse reticular nodular interstitial pattern to the lungs. No evidence of active pulmonary disease. Electronically Signed   By: Lucienne Capers M.D.   On: 04/21/2016 00:44    Microbiology: Recent Results (from the past 240 hour(s))  Rapid strep screen     Status: None   Collection Time: 04/23/16  2:43 PM  Result Value Ref Range Status   Streptococcus, Group A Screen (Direct) NEGATIVE NEGATIVE Final    Comment: (NOTE) A Rapid Antigen test may result negative if the antigen level in the sample is below the detection level of this test. The FDA has  not cleared this test as a stand-alone test therefore the rapid antigen negative result has reflexed to a Group A Strep culture.   Culture, group A strep     Status: None (Preliminary result)   Collection Time: 04/23/16  2:43 PM  Result Value Ref Range Status   Specimen Description THROAT  Final   Special Requests NONE Reflexed from [id number]  Final   Culture   Final    CULTURE REINCUBATED FOR BETTER GROWTH Performed at [facility]    Report Status PENDING  Incomplete  Culture, blood (routine x 2)     Status: None (Preliminary result)   Collection Time: 04/23/16  3:00 PM  Result Value Ref Range Status   Specimen Description LEFT ANTECUBITAL  Final   Special Requests BOTTLES DRAWN AEROBIC AND ANAEROBIC 10CC EACH  Final   Culture NO GROWTH 2 DAYS  Final   Report Status PENDING  Incomplete  Culture, blood (routine x 2)     Status: None (Preliminary result)   Collection Time: 04/23/16  3:10 PM  Result Value Ref Range Status   Specimen Description BLOOD RIGHT HAND  Final   Special Requests BOTTLES DRAWN AEROBIC ONLY 4CC  Final   Culture NO GROWTH 2 DAYS  Final   Report Status PENDING  Incomplete     Labs: Basic Metabolic Panel:  Recent Labs Lab 04/21/16 0012 04/23/16 1548 04/24/16 0608 04/25/16 1121 04/26/16 0423  NA 140 137 139 135 137  K 3.7 3.3* 4.3 4.0 4.3  CL 105 103 109 107 102  CO2 28 27 27 24 29   GLUCOSE 101* 91 94 107* 95  BUN 15 15 12 6 6   CREATININE 1.17 1.14 0.98 0.84 0.88  CALCIUM 8.5* 8.3* 8.1* 8.4* 8.9   Liver Function Tests:  Recent Labs Lab 04/21/16 0012 04/23/16 1548 04/25/16 1121  AST 22 43* 49*  ALT 23 27 34  ALKPHOS 46 52 51  BILITOT 0.6 0.7 0.3  PROT 6.7 6.8 6.2*  ALBUMIN 3.5 3.4* 3.0*   No results for input(s): LIPASE, AMYLASE in the last 168 hours. No results for input(s): AMMONIA in the last 168 hours. CBC:  Recent Labs Lab 04/21/16 0012 04/23/16 1440 04/25/16 1121  WBC 5.5 4.5 3.1*  NEUTROABS 3.6 3.7 1.1*  HGB 13.4  13.9 13.1  HCT 39.8 42.7 37.6*  MCV 94.1 95.3 91.9  PLT 190 171 97*   Cardiac Enzymes:  Recent Labs Lab 04/21/16 0012  TROPONINI <0.03   BNP: BNP (last 3 results)  Recent Labs  04/21/16 0012  BNP 27.0    ProBNP (last 3 results) No results for input(s): PROBNP in the last 8760 hours.  CBG: No results for input(s): GLUCAP in the last 168 hours.     Signed:  Elwin Mocha MD  FACP  [facility] 04/26/2016, 8:57 AM

## 2016-04-26 NOTE — Discharge Instructions (Signed)
Follow up at [facility] in reference to your lung disease   Tick Bite Information Ticks are insects that attach themselves to the skin and draw blood for food. There are various types of ticks. Common types include wood ticks and deer ticks. Most ticks live in shrubs and grassy areas. Ticks can climb onto your body when you make contact with leaves or grass where the tick is waiting. The most common places on the body for ticks to attach themselves are the scalp, neck, armpits, waist, and groin. Most tick bites are harmless, but sometimes ticks carry germs that cause diseases. These germs can be spread to a person during the tick's feeding process. The chance of a disease spreading through a tick bite depends on:   The type of tick.  Time of year.   How long the tick is attached.   Geographic location.  HOW CAN YOU PREVENT TICK BITES? Take these steps to help prevent tick bites when you are outdoors:  Wear protective clothing. Long sleeves and long pants are best.   Wear white clothes so you can see ticks more easily.  Tuck your pant legs into your socks.   If walking on a trail, stay in the middle of the trail to avoid brushing against bushes.  Avoid walking through areas with long grass.  Put insect repellent on all exposed skin and along boot tops, pant legs, and sleeve cuffs.   Check clothing, hair, and skin repeatedly and before going inside.   Brush off any ticks that are not attached.  Take a shower or bath as soon as possible after being outdoors.  WHAT IS THE PROPER WAY TO REMOVE A TICK? Ticks should be removed as soon as possible to help prevent diseases caused by tick bites. 1. If latex gloves are available, put them on before trying to remove a tick.  2. Using fine-point tweezers, grasp the tick as close to the skin as possible. You may also use curved forceps or a tick removal tool. Grasp the tick as close to its head as possible. Avoid grasping the tick on its  body. 3. Pull gently with steady upward pressure until the tick lets go. Do not twist the tick or jerk it suddenly. This may break off the tick's head or mouth parts. 4. Do not squeeze or crush the tick's body. This could force disease-carrying fluids from the tick into your body.  5. After the tick is removed, wash the bite area and your hands with soap and water or other disinfectant such as alcohol. 6. Apply a small amount of antiseptic cream or ointment to the bite site.  7. Wash and disinfect any instruments that were used.  Do not try to remove a tick by applying a hot match, petroleum jelly, or fingernail polish to the tick. These methods do not work and may increase the chances of disease being spread from the tick bite.  WHEN SHOULD YOU SEEK MEDICAL CARE? Contact your health care provider if you are unable to remove a tick from your skin or if a part of the tick breaks off and is stuck in the skin.  After a tick bite, you need to be aware of signs and symptoms that could be related to diseases spread by ticks. Contact your health care provider if you develop any of the following in the days or weeks after the tick bite:  Unexplained fever.  Rash. A circular rash that appears days or weeks after the tick  bite may indicate the possibility of Lyme disease. The rash may resemble a target with a bull's-eye and may occur at a different part of your body than the tick bite.  Redness and swelling in the area of the tick bite.   Tender, swollen lymph glands.   Diarrhea.   Weight loss.   Cough.   Fatigue.   Muscle, joint, or bone pain.   Abdominal pain.   Headache.   Lethargy or a change in your level of consciousness.  Difficulty walking or moving your legs.   Numbness in the legs.   Paralysis.  Shortness of breath.   Confusion.   Repeated vomiting.    This information is not intended to replace advice given to you by your health care provider. Make  sure you discuss any questions you have with your health care provider.   Document Released: 10/22/2000 Document Revised: 11/15/2014 Document Reviewed: 04/04/2013 Elsevier Interactive Patient Education [employer].

## 2016-04-27 ENCOUNTER — Other Ambulatory Visit: Payer: Self-pay | Admitting: Physician Assistant

## 2016-04-27 DIAGNOSIS — R509 Fever, unspecified: Secondary | ICD-10-CM

## 2016-04-27 DIAGNOSIS — E876 Hypokalemia: Secondary | ICD-10-CM

## 2016-04-27 LAB — ROCKY MTN SPOTTED FVR ABS PNL(IGG+IGM)
RMSF IGG: NEGATIVE
RMSF IGM: 0.83 {index} (ref 0.00–0.89)

## 2016-04-27 LAB — CALCIUM, IONIZED
Calcium, Ionized, Serum: 4.8 mg/dL (ref 4.5–5.6)
Calcium, Ionized, Serum: 5 mg/dL (ref 4.5–5.6)

## 2016-04-27 LAB — B. BURGDORFI ANTIBODIES: B burgdorferi Ab IgG+IgM: <0.91 {ISR} (ref 0.00–0.90)

## 2016-04-28 LAB — CULTURE, BLOOD (ROUTINE X 2)
CULTURE: NO GROWTH
Culture: NO GROWTH

## 2016-04-28 LAB — RMSF, IGG, IFA: RMSF, IGG, IFA: <1:64 {titer}

## 2016-04-28 LAB — ROCKY MTN SPOTTED FVR ABS PNL(IGG+IGM)
RMSF IGG: POSITIVE — AB
RMSF IgM: 0.9 index — ABNORMAL HIGH (ref 0.00–0.89)

## 2016-04-30 LAB — CBC WITH DIFFERENTIAL/PLATELET
BASOS ABS: 103 {cells}/uL (ref 0–200)
Basophils Relative: 1 %
EOS PCT: 2 %
Eosinophils Absolute: 206 cells/uL (ref 15–500)
HCT: 41.5 % (ref 38.5–50.0)
Hemoglobin: 14.2 g/dL (ref 13.2–17.1)
LYMPHS PCT: 42 %
Lymphs Abs: 4326 cells/uL — ABNORMAL HIGH (ref 850–3900)
MCH: 31.7 pg (ref 27.0–33.0)
MCHC: 34.2 g/dL (ref 32.0–36.0)
MCV: 92.6 fL (ref 80.0–100.0)
MONOS PCT: 8 %
MPV: 10.7 fL (ref 7.5–12.5)
Monocytes Absolute: 824 cells/uL (ref 200–950)
NEUTROS ABS: 4841 {cells}/uL (ref 1500–7800)
Neutrophils Relative %: 47 %
PLATELETS: 226 10*3/uL (ref 140–400)
RBC: 4.48 MIL/uL (ref 4.20–5.80)
RDW: 14.6 % (ref 11.0–15.0)
WBC: 10.3 10*3/uL (ref 3.8–10.8)

## 2016-04-30 LAB — COMPLETE METABOLIC PANEL WITH GFR
ALT: 253 U/L — AB (ref 9–46)
AST: 135 U/L — ABNORMAL HIGH (ref 10–40)
Albumin: 3.6 g/dL (ref 3.6–5.1)
Alkaline Phosphatase: 58 U/L (ref 40–115)
BUN: 15 mg/dL (ref 7–25)
CO2: 26 mmol/L (ref 20–31)
CREATININE: 0.89 mg/dL (ref 0.60–1.35)
Calcium: 8.8 mg/dL (ref 8.6–10.3)
Chloride: 103 mmol/L (ref 98–110)
Glucose, Bld: 95 mg/dL (ref 65–99)
Potassium: 4.9 mmol/L (ref 3.5–5.3)
Sodium: 137 mmol/L (ref 135–146)
TOTAL PROTEIN: 6.7 g/dL (ref 6.1–8.1)
Total Bilirubin: 0.4 mg/dL (ref 0.2–1.2)

## 2016-05-03 ENCOUNTER — Ambulatory Visit: Payer: Medicaid Other | Admitting: Physician Assistant

## 2016-05-03 ENCOUNTER — Encounter: Payer: Self-pay | Admitting: Physician Assistant

## 2016-05-03 VITALS — BP 104/66 | HR 79 | Temp 97.5°F | Ht 71.0 in | Wt 193.6 lb

## 2016-05-03 DIAGNOSIS — R7989 Other specified abnormal findings of blood chemistry: Secondary | ICD-10-CM

## 2016-05-03 DIAGNOSIS — A770 Spotted fever due to Rickettsia rickettsii: Secondary | ICD-10-CM

## 2016-05-03 DIAGNOSIS — K219 Gastro-esophageal reflux disease without esophagitis: Secondary | ICD-10-CM

## 2016-05-03 DIAGNOSIS — D8989 Other specified disorders involving the immune mechanism, not elsewhere classified: Secondary | ICD-10-CM

## 2016-05-03 DIAGNOSIS — F250 Schizoaffective disorder, bipolar type: Secondary | ICD-10-CM

## 2016-05-03 DIAGNOSIS — R945 Abnormal results of liver function studies: Secondary | ICD-10-CM

## 2016-05-03 LAB — EHRLICHIA ANTIBODY PANEL
E chaffeensis (HGE) Ab, IgG: NEGATIVE
E chaffeensis (HGE) Ab, IgM: NEGATIVE
E. CHAFFEENSIS (HME) IGM TITER: NEGATIVE
E.Chaffeensis (HME) IgG: NEGATIVE

## 2016-05-03 NOTE — Progress Notes (Signed)
BP 104/66 mmHg  Pulse 79  Temp(Src) 97.5 F (36.4 C)  Ht 5' 11" (1.803 m)  Wt 193 lb 9.6 oz (87.816 kg)  BMI 27.01 kg/m2  SpO2 90%   Subjective:    Patient ID: Shawn [name], male    DOB: 1971-01-15, 45 y.o.   MRN: [id number]  HPI: Shawn [name] is a 45 y.o. male presenting on 05/03/2016 for Follow-up   HPI Chief Complaint  Patient presents with  . Follow-up    from [facility]     -RMSF test +.   Pt completed his 7 day course of doxycycline.   -Pt is feeling much better but is still a bit tired.  -Pt is still going to [facility] for [place] issues -pt is being seen by GI for diarrhea/GERD -pt still being evaluated by pulmonology for breathing issues. Pathology report sent for second opinion by specialist. Pt says he hasn't heard result of that yet.    Relevant past medical, surgical, family and social history reviewed and updated as indicated. Interim medical history since our last visit reviewed. Allergies and medications reviewed and updated.   Current outpatient prescriptions:  .  albuterol (PROVENTIL HFA;VENTOLIN HFA) 108 (90 BASE) MCG/ACT inhaler, Inhale 1-2 puffs into the lungs every 6 (six) hours as needed for wheezing or shortness of breath., Disp: , Rfl:  .  Citalopram Hydrobromide (CELEXA PO), Take 1 tablet by mouth daily., Disp: , Rfl:  .  cyclobenzaprine (FLEXERIL) 10 MG tablet, Take 10 mg by mouth 3 (three) times daily as needed for muscle spasms., Disp: , Rfl:  .  dexlansoprazole (DEXILANT) 60 MG capsule, Take 1 capsule (60 mg total) by mouth daily., Disp: 20 capsule, Rfl: 0 .  diclofenac (VOLTAREN) 75 MG EC tablet, Take 75 mg by mouth 2 (two) times daily as needed for mild pain. , Disp: , Rfl:  .  dicyclomine (BENTYL) 10 MG capsule, Take 1 capsule (10 mg total) by mouth 4 (four) times daily -  before meals and at bedtime. As needed for loose stools and abdominal pain, Disp: 120 capsule, Rfl: 2 .  DULoxetine (CYMBALTA) 30 MG capsule, Take 30 mg by mouth daily.  Reported on 05/03/2016, Disp: , Rfl:  .  FLUoxetine (PROZAC) 40 MG capsule, Take 40 mg by mouth daily., Disp: , Rfl:  .  gabapentin (NEURONTIN) 300 MG capsule, Take 300 mg by mouth 3 (three) times daily as needed (for pain). , Disp: , Rfl:  .  lamoTRIgine (LAMICTAL) 25 MG tablet, Take 25 mg by mouth 2 (two) times daily. , Disp: , Rfl:  .  metoprolol tartrate (LOPRESSOR) 25 MG tablet, Take 0.5 tablets (12.5 mg total) by mouth 2 (two) times daily. (Patient taking differently: Take 25 mg by mouth 2 (two) times daily. ), Disp: , Rfl:  .  Multiple Vitamin (MULTIVITAMIN) tablet, Take 1 tablet by mouth daily., Disp: , Rfl:  .  omeprazole (PRILOSEC) 40 MG capsule, Take 40 mg by mouth daily., Disp: , Rfl:  .  ondansetron (ZOFRAN) 4 MG tablet, TAKE ONE TABLET BY MOUTH EVERY 8 HOURS AS NEEDED FOR NAUSEA OR VOMITING, Disp: 30 tablet, Rfl: 1 .  traZODone (DESYREL) 150 MG tablet, Take 300 mg by mouth at bedtime as needed for sleep. , Disp: , Rfl:    Review of Systems  Constitutional: Positive for appetite change and fatigue. Negative for fever, chills, diaphoresis and unexpected weight change.  HENT: Negative for congestion, dental problem, drooling, ear pain, facial swelling, hearing loss, mouth sores,  sneezing, sore throat, trouble swallowing and voice change.   Eyes: Negative for pain, discharge, redness and itching.  Respiratory: Positive for cough, chest tightness and shortness of breath. Negative for choking and wheezing.   Cardiovascular: Negative for chest pain, palpitations and leg swelling.  Gastrointestinal: Negative for vomiting, abdominal pain, diarrhea, constipation and blood in stool.  Endocrine: Positive for polydipsia. Negative for cold intolerance and heat intolerance.  Genitourinary: Positive for dysuria. Negative for hematuria and decreased urine volume.  Musculoskeletal: Positive for back pain and arthralgias. Negative for gait problem.  Skin: Negative for rash.  Allergic/Immunologic:  Negative for environmental allergies.  Neurological: Negative for seizures, syncope, light-headedness and headaches.  Hematological: Negative for adenopathy.  Psychiatric/Behavioral: Positive for agitation. Negative for suicidal ideas and dysphoric mood. The patient is nervous/anxious.     Per HPI unless specifically indicated above     Objective:    BP 104/66 mmHg  Pulse 79  Temp(Src) 97.5 F (36.4 C)  Ht 5' 11" (1.803 m)  Wt 193 lb 9.6 oz (87.816 kg)  BMI 27.01 kg/m2  SpO2 90%  Wt Readings from Last 3 Encounters:  05/03/16 193 lb 9.6 oz (87.816 kg)  04/23/16 197 lb 8 oz (89.585 kg)  04/20/16 191 lb (86.637 kg)    Physical Exam  Constitutional: He is oriented to person, place, and time. He appears well-developed and well-nourished.  HENT:  Head: Normocephalic and atraumatic.  Neck: Neck supple.  Cardiovascular: Normal rate and regular rhythm.   Pulmonary/Chest: Effort normal and breath sounds normal. He has no wheezes.  Abdominal: Soft. Bowel sounds are normal. There is no hepatosplenomegaly. There is no tenderness.  Musculoskeletal: He exhibits no edema.  Lymphadenopathy:    He has no cervical adenopathy.  Neurological: He is alert and oriented to person, place, and time.  Skin: Skin is warm and dry. No rash noted.  No bites on LE seen  Psychiatric: He has a normal mood and affect. His behavior is normal.  Vitals reviewed.      Results for orders placed or performed in visit on 04/27/16  CBC w/Diff/Platelet  Result Value Ref Range   WBC 10.3 3.8 - 10.8 K/uL   RBC 4.48 4.20 - 5.80 MIL/uL   Hemoglobin 14.2 13.2 - 17.1 g/dL   HCT 41.5 38.5 - 50.0 %   MCV 92.6 80.0 - 100.0 fL   MCH 31.7 27.0 - 33.0 pg   MCHC 34.2 32.0 - 36.0 g/dL   RDW 14.6 11.0 - 15.0 %   Platelets 226 140 - 400 K/uL   MPV 10.7 7.5 - 12.5 fL   Neutro Abs 4841 1500 - 7800 cells/uL   Lymphs Abs 4326 (H) 850 - 3900 cells/uL   Monocytes Absolute 824 200 - 950 cells/uL   Eosinophils Absolute 206  15 - 500 cells/uL   Basophils Absolute 103 0 - 200 cells/uL   Neutrophils Relative % 47 %   Lymphocytes Relative 42 %   Monocytes Relative 8 %   Eosinophils Relative 2 %   Basophils Relative 1 %   Smear Review Criteria for review not met   COMPLETE METABOLIC PANEL WITH GFR  Result Value Ref Range   Sodium 137 135 - 146 mmol/L   Potassium 4.9 3.5 - 5.3 mmol/L   Chloride 103 98 - 110 mmol/L   CO2 26 20 - 31 mmol/L   Glucose, Bld 95 65 - 99 mg/dL   BUN 15 7 - 25 mg/dL   Creat 0.89 0.60 - 1.35  mg/dL   Total Bilirubin 0.4 0.2 - 1.2 mg/dL   Alkaline Phosphatase 58 40 - 115 U/L   AST 135 (H) 10 - 40 U/L   ALT 253 (H) 9 - 46 U/L   Total Protein 6.7 6.1 - 8.1 g/dL   Albumin 3.6 3.6 - 5.1 g/dL   Calcium 8.8 8.6 - 10.3 mg/dL   GFR, Est African American >89 >=60 mL/min   GFR, Est Non African American >89 >=60 mL/min      Assessment & Plan:   Encounter Diagnoses  Name Primary?  . 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 Elevated LFTs   . 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 Schizoaffective disorder, bipolar type ([place])   . Gastroesophageal reflux disease, esophagitis presence not specified      -reviewed labs with pt -recheck LFTs in 2 wk -f/u OV as scheduled.  He is counseled to RTO if he begins to feel bad/sick again

## 2016-05-24 ENCOUNTER — Other Ambulatory Visit: Payer: Self-pay | Admitting: Physician Assistant

## 2016-05-24 LAB — HEPATIC FUNCTION PANEL
ALBUMIN: 3.9 g/dL (ref 3.6–5.1)
ALK PHOS: 57 U/L (ref 40–115)
ALT: 19 U/L (ref 9–46)
AST: 15 U/L (ref 10–40)
Bilirubin, Direct: 0.1 mg/dL (ref <=0.2)
Indirect Bilirubin: 0.2 mg/dL (ref 0.2–1.2)
TOTAL PROTEIN: 6.9 g/dL (ref 6.1–8.1)
Total Bilirubin: 0.3 mg/dL (ref 0.2–1.2)

## 2016-05-24 LAB — CBC WITH DIFFERENTIAL/PLATELET
Basophils Absolute: 0 cells/uL (ref 0–200)
Basophils Relative: 0 %
EOS ABS: 416 {cells}/uL (ref 15–500)
Eosinophils Relative: 4 %
HEMATOCRIT: 41.9 % (ref 38.5–50.0)
Hemoglobin: 14.3 g/dL (ref 13.2–17.1)
LYMPHS PCT: 35 %
Lymphs Abs: 3640 cells/uL (ref 850–3900)
MCH: 31.8 pg (ref 27.0–33.0)
MCHC: 34.1 g/dL (ref 32.0–36.0)
MCV: 93.1 fL (ref 80.0–100.0)
MONO ABS: 832 {cells}/uL (ref 200–950)
MPV: 10.3 fL (ref 7.5–12.5)
Monocytes Relative: 8 %
NEUTROS PCT: 53 %
Neutro Abs: 5512 cells/uL (ref 1500–7800)
Platelets: 232 10*3/uL (ref 140–400)
RBC: 4.5 MIL/uL (ref 4.20–5.80)
RDW: 14.3 % (ref 11.0–15.0)
WBC: 10.4 10*3/uL (ref 3.8–10.8)

## 2016-05-25 ENCOUNTER — Encounter: Payer: Self-pay | Admitting: Nurse Practitioner

## 2016-05-25 ENCOUNTER — Ambulatory Visit: Payer: Medicaid Other | Admitting: Nurse Practitioner

## 2016-05-25 ENCOUNTER — Telehealth: Payer: Self-pay | Admitting: Nurse Practitioner

## 2016-05-25 NOTE — Telephone Encounter (Signed)
PATIENT WAS A NO SHOW AND LETTER SENT  °

## 2016-05-25 NOTE — Telephone Encounter (Signed)
Noted  

## 2016-06-02 ENCOUNTER — Ambulatory Visit (INDEPENDENT_AMBULATORY_CARE_PROVIDER_SITE_OTHER): Payer: Medicaid Other | Admitting: Pulmonary Disease

## 2016-06-02 ENCOUNTER — Encounter: Payer: Self-pay | Admitting: Pulmonary Disease

## 2016-06-02 VITALS — BP 122/68 | HR 70 | Ht 71.0 in | Wt 189.4 lb

## 2016-06-02 DIAGNOSIS — R0689 Other abnormalities of breathing: Secondary | ICD-10-CM

## 2016-06-02 DIAGNOSIS — R0600 Dyspnea, unspecified: Secondary | ICD-10-CM

## 2016-06-02 DIAGNOSIS — D8989 Other specified disorders involving the immune mechanism, not elsewhere classified: Secondary | ICD-10-CM

## 2016-06-02 LAB — PULMONARY FUNCTION TEST
DL/VA % PRED: 96 %
DL/VA: 4.53 ml/min/mmHg/L
DLCO COR % PRED: 76 %
DLCO cor: 25.83 ml/min/mmHg
DLCO unc % pred: 78 %
DLCO unc: 26.32 ml/min/mmHg
FEF 25-75 Pre: 3.75 L/sec
FEF2575-%Pred-Pre: 99 %
FEV1-%PRED-PRE: 78 %
FEV1-PRE: 3.3 L
FEV1FVC-%PRED-PRE: 102 %
FEV6-%Pred-Pre: 76 %
FEV6-Pre: 3.95 L
FEV6FVC-%Pred-Pre: 99 %
FVC-%Pred-Pre: 76 %
FVC-Pre: 4.07 L
Pre FEV1/FVC ratio: 81 %
Pre FEV6/FVC Ratio: 97 %

## 2016-06-02 NOTE — Patient Instructions (Signed)
   Call me if you have any worsening in your breathing or cough before your next appointment.  We will contact you once I have the formal report back from [facility] to discuss your follow-up appointments & further treatement

## 2016-06-02 NOTE — Progress Notes (Signed)
PFT 06/02/16: FVC 4.07 L (76%) FEV1 3.30 L (78%) FEV1/FVC 0.81 FEF 25-75 3.75 L (99%) DLCO corrected 76% (Hgb 15.3)  6MWT 06/02/16:  Walked 501 meters / Baseline Sat 96% on RA / Nadir Sat 96% on RA

## 2016-06-02 NOTE — Progress Notes (Signed)
Subjective:    Patient ID: Shawn [name], male    DOB: [birth year], 45 y.o.   MRN: [id number]  C.C.:  Follow-up for Dyspnea & IgG4/Nodular Lung Disease.  HPI  Patient hospitalized from 6/16-6/19 with low blood pressure, fever, cough and recent tick bites. EIA positive for RMSF. Treated with Doxycycline.  Dyspnea:  He reports dyspnea is unchanged. He continues to have an intermittent cough. He hasn't been coughing more at night.   IgG4/Nodular Lung Disease:  Identified on VATS biopsy. No symptomatic benefit from prednisone therapy & worsening spirometry. Pathology sent to Dr. Reggie Pile at [facility] for 3rd review. He reports no significant change in his dyspnea since he weaned off his Prednisone.   Review of Systems He reports he has had occasional chest "tightness". He denies any chest pain or pressure. No fever, chills, or sweats. No new rashes or bruising.   No Known Allergies  Current Outpatient Prescriptions on File Prior to Visit  Medication Sig Dispense Refill  . albuterol (PROVENTIL HFA;VENTOLIN HFA) 108 (90 BASE) MCG/ACT inhaler Inhale 1-2 puffs into the lungs every 6 (six) hours as needed for wheezing or shortness of breath.    . Citalopram Hydrobromide (CELEXA PO) Take 1 tablet by mouth daily.    . cyclobenzaprine (FLEXERIL) 10 MG tablet Take 10 mg by mouth 3 (three) times daily as needed for muscle spasms.    Marland Kitchen dexlansoprazole (DEXILANT) 60 MG capsule Take 1 capsule (60 mg total) by mouth daily. 20 capsule 0  . diclofenac (VOLTAREN) 75 MG EC tablet Take 75 mg by mouth 2 (two) times daily as needed for mild pain.     Marland Kitchen dicyclomine (BENTYL) 10 MG capsule Take 1 capsule (10 mg total) by mouth 4 (four) times daily -  before meals and at bedtime. As needed for loose stools and abdominal pain 120 capsule 2  . DULoxetine (CYMBALTA) 30 MG capsule Take 30 mg by mouth daily. Reported on 05/03/2016    . FLUoxetine (PROZAC) 40 MG capsule Take 40 mg by mouth daily.    Marland Kitchen gabapentin (NEURONTIN) 300  MG capsule Take 300 mg by mouth 3 (three) times daily as needed (for pain).     Marland Kitchen lamoTRIgine (LAMICTAL) 25 MG tablet Take 25 mg by mouth 2 (two) times daily.     . metoprolol tartrate (LOPRESSOR) 25 MG tablet Take 0.5 tablets (12.5 mg total) by mouth 2 (two) times daily. (Patient taking differently: Take 25 mg by mouth 2 (two) times daily. )    . Multiple Vitamin (MULTIVITAMIN) tablet Take 1 tablet by mouth daily.    Marland Kitchen omeprazole (PRILOSEC) 40 MG capsule Take 40 mg by mouth daily.    . ondansetron (ZOFRAN) 4 MG tablet TAKE ONE TABLET BY MOUTH EVERY 8 HOURS AS NEEDED FOR NAUSEA OR VOMITING 30 tablet 1  . traZODone (DESYREL) 150 MG tablet Take 300 mg by mouth at bedtime as needed for sleep.      No current facility-administered medications on file prior to visit.     Past Medical History:  Diagnosis Date  . Anemia   . Arthritis   . Back pain   . Bipolar 1 disorder ([place])   . Bipolar disorder ([place])   . 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 COPD (chronic obstructive pulmonary disease) ([place])   . Diverticulitis   . Family history of adverse reaction to anesthesia    he was adopted  . GERD (  gastroesophageal reflux disease)   . Heart disease    "irregular heart beat"  . HOH (hard of hearing)    biological parents are both deaf.   left ear is good.  . Hyperlipidemia   . Hypertension   . 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 Lung nodules   . Schizoaffective disorder, bipolar type ([place])   . Vomiting     Past Surgical History:  Procedure Laterality Date  . BRAIN SURGERY  2005   after head injury, patient states had 2 large blood clots evacuated  . COLONOSCOPY WITH ESOPHAGOGASTRODUODENOSCOPY (EGD) N/A 12/05/2013   [id number] erosive relfux/HH/melanosis coli/colonic diverticulosis. tubulovillous adenoma removed. next tcs 11/2016  . FLEXIBLE BRONCHOSCOPY N/A 01/23/2016   Procedure: FLEXIBLE BRONCHOSCOPY;  Surgeon: Gaye Pollack, MD;  Location[facility] OR;  Service:  Thoracic;  Laterality: N/A;  . LUNG BIOPSY N/A 01/23/2016   Procedure: LEFT LUNG BIOPSY;  Surgeon: Gaye Pollack, MD;  Location: [facility] OR;  Service: Thoracic;  Laterality: N/A;  . VIDEO ASSISTED THORACOSCOPY Left 01/23/2016   Procedure: LEFT VIDEO ASSISTED THORACOSCOPY;  Surgeon: Gaye Pollack, MD;  Location: [facility];  Service: Thoracic;  Laterality: Left;    Family History  Problem Relation Age of Onset  . Adopted: Yes  . Colon cancer Neg Hx     adopted at 62 months old, unsure about GI history of parents   . Alzheimer's disease Other   . Parkinson's disease Other   . Mental illness Other     Social History   Social History  . Marital status: Divorced    Spouse name: N/A  . Number of children: 1  . Years of education: N/A   Occupational History  . disabled    Social History Main Topics  . Smoking status: Former Smoker    Packs/day: 0.75    Years: 25.00    Types: Cigars, Cigarettes    Quit date: 07/09/2014  . Smokeless tobacco: Current User    Types: Snuff     Comment: pt uses dip tobacco  . Alcohol use 0.0 oz/week     Comment: occ  . Drug use: No  . Sexual activity: Not Asked   Other Topics Concern  . None   Social History Narrative   He is originally from [place]. 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 Has always lived in [place]. Previously have traveled to [place], [place], [place], [place], [place]. Remote travel to [place]. 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 Lived in a house with mold around 2001-2002 as well as 2006. Has 2 dogs currently. No bird exposure. No hot tub exposure. Mainly walks for fun. He was incarcerated for 2 years previously. No known TB exposure. Had previous negative PPDs last around 2010. Never lived in a homeless shelter but has eaten at them before.       Objective:   Physical Exam BP 122/68 (BP Location: Left Arm, Cuff Size: Normal)   Pulse 70   Ht 5\' 11"  (1.803 m)   Wt 189 lb 6.4 oz (85.9 kg)   SpO2 94%   BMI 26.42 kg/m  General:   Awake. Alert. Mother with him today. Integument:  Warm & dry. No rash on exposed skin. Tattoo is noted. Lymphatics:  No appreciated cervical or supraclavicular lymphadenoapthy. HEENT:  Moist mucus membranes. No scleral icterus. No nasal turbinate swelling. Cardiovascular:  Regular rate. No edema. Normal S1 & S2. Abdomen: Soft. Protuberant. Normal bowel sounds. Pulmonary:  Clear with auscultation bilaterally.  No accessory muscle use. Speaking in complete sentences. Musculoskeletal:  No joint effusion or deformity appreciated. Normal bulk and tone.  PFT 06/02/16: FVC 4.07 L (76%) FEV1 3.30 L (78%) FEV1/FVC 0.81 FEF 25-75 3.75 L (99%)                                                                                                                       DLCO corrected 76% (Hgb 15.3) 03/17/16: FVC 4.12 L (77%) FEV1 3.33 L (79%) FEV1/FVC 0.81 FEF 25-75 3.59 L (94%)                                                                                                                       DLCO corrected 85% (Hgb 14.6)  01/06/16: FVC 4.66 L (87%) FEV1 3.86 L (92%) FEV1/FVC 0.83 FEF 25-75 4.78 L (125%) no bronchodilator response TLC 6.74 L (94%) RV 66% ERV 23% DLCO uncorrected 103%  CPET (01/14/16): Normal functional capacity with no clear ventilatory or circulatory limitations to exercise.  6MWT 06/02/16:  Walked 501 meters / Baseline Sat 96% on RA / Nadir Sat 96% on RA 03/20/16:  Walked 474 meters / Baseline Sat 99% on RA / Nadir Sat 99% on RA (c/o "dizziness") 01/06/16:  Walked 528 meters / Baseline Sat 97% on RA / Nadir 97% on RA @ rest (c/o pain in right rib cage, clammy sensation & tightness in chest)  IMAGING CTA CHEST 02/20/16 (per radiologist): Negative for pulmonary embolism. Diffuse nodularity persists. Small hiatal hernia.  CT CHEST W/O 10/20/15 (previously reviewed by me): Pulmonary nodularity diffusely in bronchovascular distribution width 1.4 cm groundglass nodule left upper lobe. Additional  subcentimeter groundglass nodules noted.  Cystic changes noted bilaterally with largest within right lower lobe. Subcentimeter mediastinal lymph nodes without pathologic enlargement. No pleural effusion or thickening. No pericardial effusion. No esophageal dilation or mucosal thickening. Nodules appear essentially unchanged when compared with CT scan from 2015. There do appear to be more cystic areas within the lungs on my review.  PATHOLOGY LUL WEDGE RESECTION (01/23/16): Diffuse lymphoid hyperplasia with plasmacytosis and increased IgG4 expression. No evidence of malignancy.  LABS 02/12/16 IgG subclass 1: 931 (382-929) IgG subclass 2: 381 IgG subclass 3: 84 IgG subclass 4: 58.4 (4-86.0)  12/22/15 Alpha-1 antitrypsin: MM (136) ANA: Negative Rheumatoid factor: 10 Anti-CCP:  <16 SSA:  <1 SSB:  <1 Quantiferon TB:  Negative (PPD negative) HIV:  Negative   11/05/15 CBC: 10.4/15.3/44.1/250 BMP: 141/4.7/103/28/21/1.46/108/9.7 LFT: 4.2/7.6/0.5/45/28/44    Assessment &  Plan:  45 year old Caucasian male with IgG4 related lung disease/nodular lung disease. I'm awaiting the formal report from [facility] to confirm his underlying lung disease. Symptomatically he did not respond to treatment with prednisone. Additionally, his spirometry today while mildly decreased is likely due to his central weight gain while on prednisone given the stability in his walk test distance and oxygen saturation. I did spend a significant amount of time today discussing the possible need for further evaluation and a tertiary care center by pulmonologist depending upon the report from pathology. I instructed the patient to contact my office if he had any new breathing problems, clinical worsening, or questions before his next appointment.  1. Dyspnea: Likely related to his underlying lung disease.  2. IgG4 Related/Nodular Lung Disease: Awaiting review by Dr. Reggie Pile at [facility]. Holding on further  immunosuppression at this time. 3. Health Maintenance: Pneumovax April 2017 & Tdap December 2015. Plan for Prevnar vaccine April 2018. 4. Follow-up: Return to clinic depending upon results from pathology.  Sonia Baller Ashok Cordia, M.D. [facility] Pager:  [phone] After 3pm or if no response, call [phone] 2:26 PM 06/02/16

## 2016-06-03 NOTE — Progress Notes (Signed)
Test reviewed.  

## 2016-06-18 ENCOUNTER — Other Ambulatory Visit: Payer: Self-pay | Admitting: Physician Assistant

## 2016-06-18 LAB — COMPREHENSIVE METABOLIC PANEL
ALT: 14 U/L (ref 9–46)
AST: 18 U/L (ref 10–40)
Albumin: 3.8 g/dL (ref 3.6–5.1)
Alkaline Phosphatase: 67 U/L (ref 40–115)
BUN: 17 mg/dL (ref 7–25)
CHLORIDE: 104 mmol/L (ref 98–110)
CO2: 27 mmol/L (ref 20–31)
Calcium: 9.1 mg/dL (ref 8.6–10.3)
Creat: 0.89 mg/dL (ref 0.60–1.35)
Glucose, Bld: 93 mg/dL (ref 65–99)
POTASSIUM: 4.5 mmol/L (ref 3.5–5.3)
Sodium: 139 mmol/L (ref 135–146)
TOTAL PROTEIN: 6.8 g/dL (ref 6.1–8.1)
Total Bilirubin: 0.4 mg/dL (ref 0.2–1.2)

## 2016-06-18 LAB — LIPID PANEL
CHOL/HDL RATIO: 3.7 ratio (ref <=5.0)
CHOLESTEROL: 197 mg/dL (ref 125–200)
HDL: 53 mg/dL (ref >=40)
LDL Cholesterol: 114 mg/dL (ref <130)
Triglycerides: 151 mg/dL — ABNORMAL HIGH (ref <150)
VLDL: 30 mg/dL (ref <30)

## 2016-06-21 ENCOUNTER — Telehealth: Payer: Self-pay | Admitting: Pulmonary Disease

## 2016-06-21 DIAGNOSIS — D8989 Other specified disorders involving the immune mechanism, not elsewhere classified: Secondary | ICD-10-CM

## 2016-06-21 NOTE — Telephone Encounter (Signed)
Pt called back. He is requesting a referral to [facility] for 2nd opinion about his lung problem.  Please advise Dr. Ashok Cordia

## 2016-06-21 NOTE — Telephone Encounter (Signed)
Spoke with pt, aware of recs.  Referral placed.  Nothing further needed at this time.

## 2016-06-21 NOTE — Telephone Encounter (Signed)
ATC pt, line rang multiple times and no answer, no VM. WCB

## 2016-06-21 NOTE — Telephone Encounter (Signed)
I'm fine with that. Please let him know we're still waiting on the formal report from their evaluation of his lung biopsy. I'll let him know once I have this. Thanks.

## 2016-06-22 ENCOUNTER — Encounter: Payer: Self-pay | Admitting: Gastroenterology

## 2016-06-22 ENCOUNTER — Ambulatory Visit (INDEPENDENT_AMBULATORY_CARE_PROVIDER_SITE_OTHER): Payer: Medicaid Other | Admitting: Gastroenterology

## 2016-06-22 VITALS — BP 118/78 | HR 70 | Temp 97.0°F | Ht 71.0 in | Wt 194.6 lb

## 2016-06-22 DIAGNOSIS — K2100 Gastro-esophageal reflux disease with esophagitis, without bleeding: Secondary | ICD-10-CM

## 2016-06-22 MED ORDER — RANITIDINE HCL 150 MG PO CAPS: ORAL_CAPSULE | ORAL | 3 refills | Status: DC

## 2016-06-22 NOTE — Progress Notes (Signed)
CC'ED TO PCP 

## 2016-06-22 NOTE — Patient Instructions (Signed)
1. Start Zantac 150 mg before your evening meal each day. Prescription sent to pharmacy. 2. Continue Dexilant as before.  3. Call in 2 weeks and let me know how you're doing. If not significantly improved, may offer you an upper endoscopy at that time.

## 2016-06-22 NOTE — Progress Notes (Signed)
Primary Care Physician: Soyla Dryer, PA-C  Primary Gastroenterologist:  Garfield Cornea, MD   Chief Complaint  Patient presents with  . Gastroesophageal Reflux    HPI: Shawn [name] is a 45 y.o. male here for follow up. 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 On chronic prednisone therapy.   Last seen in the office back in January 2017. History of chronic diarrhea and vomiting. History of chronic GERD. Previously failed Nexium. Has been on Prilosec in the past. Now on Dexilant. GES since last OV was normal. .   Still vomiting about 2-3 times per week. Sometimes meal related but other times several hours after meals. No heartburn. No dysphagia. BM everyday. No diarrhea on bentyl. Lower abdominal pain intermittently, nothing keeps up at night. Seems to be related to need for BM and resolves after BM. Dexilant every day, can tell if misses a day.    Current Outpatient Prescriptions  Medication Sig Dispense Refill  . albuterol (PROVENTIL HFA;VENTOLIN HFA) 108 (90 BASE) MCG/ACT inhaler Inhale 1-2 puffs into the lungs every 6 (six) hours as needed for wheezing or shortness of breath.    . Citalopram Hydrobromide (CELEXA PO) Take 1 tablet by mouth daily.    . cyclobenzaprine (FLEXERIL) 10 MG tablet Take 10 mg by mouth 3 (three) times daily as needed for muscle spasms.    Marland Kitchen dexlansoprazole (DEXILANT) 60 MG capsule Take 1 capsule (60 mg total) by mouth daily. 20 capsule 0  . diclofenac (VOLTAREN) 75 MG EC tablet Take 75 mg by mouth 2 (two) times daily as needed for mild pain.     Marland Kitchen dicyclomine (BENTYL) 10 MG capsule Take 1 capsule (10 mg total) by mouth 4 (four) times daily -  before meals and at bedtime. As needed for loose stools and abdominal pain 120 capsule 2  . DULoxetine (CYMBALTA) 30 MG capsule Take 30 mg by mouth daily. Reported on 05/03/2016    . FLUoxetine (PROZAC) 40 MG capsule Take 40 mg by mouth daily.    Marland Kitchen gabapentin (NEURONTIN)  300 MG capsule Take 300 mg by mouth 3 (three) times daily as needed (for pain).     Marland Kitchen lamoTRIgine (LAMICTAL) 25 MG tablet Take 25 mg by mouth 2 (two) times daily.     . metoprolol tartrate (LOPRESSOR) 25 MG tablet Take 0.5 tablets (12.5 mg total) by mouth 2 (two) times daily. (Patient taking differently: Take 25 mg by mouth 2 (two) times daily. )    . Multiple Vitamin (MULTIVITAMIN) tablet Take 1 tablet by mouth daily.    Marland Kitchen omeprazole (PRILOSEC) 40 MG capsule Take 40 mg by mouth daily.    . ondansetron (ZOFRAN) 4 MG tablet TAKE ONE TABLET BY MOUTH EVERY 8 HOURS AS NEEDED FOR NAUSEA OR VOMITING 30 tablet 1  . traZODone (DESYREL) 150 MG tablet Take 300 mg by mouth at bedtime as needed for sleep.      No current facility-administered medications for this visit.     Allergies as of 06/22/2016  . (No Known Allergies)   Past Medical History:  Diagnosis Date  . Anemia   . Arthritis   . Back pain   . Bipolar 1 disorder ([place])   . Bipolar disorder ([place])   . 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 COPD (chronic obstructive pulmonary disease) ([place])   . Diverticulitis   . Family history of adverse reaction to  anesthesia    he was adopted  . GERD (gastroesophageal reflux disease)   . Heart disease    "irregular heart beat"  . HOH (hard of hearing)    biological parents are both deaf.   left ear is good.  . Hyperlipidemia   . Hypertension   . 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 Lung nodules   . Schizoaffective disorder, bipolar type ([place])   . Vomiting    Past Surgical History:  Procedure Laterality Date  . BRAIN SURGERY  2005   after head injury, patient states had 2 large blood clots evacuated  . COLONOSCOPY WITH ESOPHAGOGASTRODUODENOSCOPY (EGD) N/A 12/05/2013   [id number] erosive relfux/HH/melanosis coli/colonic diverticulosis. tubulovillous adenoma removed. next tcs 11/2016  . FLEXIBLE BRONCHOSCOPY N/A 01/23/2016   Procedure: FLEXIBLE BRONCHOSCOPY;  Surgeon:  Gaye Pollack, MD;  Location: [place];  Service: Thoracic;  Laterality: N/A;  . LUNG BIOPSY N/A 01/23/2016   Procedure: LEFT LUNG BIOPSY;  Surgeon: Gaye Pollack, MD;  Location: [place];  Service: Thoracic;  Laterality: N/A;  . VIDEO ASSISTED THORACOSCOPY Left 01/23/2016   Procedure: LEFT VIDEO ASSISTED THORACOSCOPY;  Surgeon: Gaye Pollack, MD;  Location: [place];  Service: Thoracic;  Laterality: Left;    ROS:  General: Negative for anorexia, weight loss, fever, chills, fatigue, weakness. ENT: Negative for hoarseness, difficulty swallowing , nasal congestion. CV: Negative for chest pain, angina, palpitations, dyspnea on exertion, peripheral edema.  Respiratory: Negative for dyspnea at rest, dyspnea on exertion, cough, sputum, wheezing.  GI: See history of present illness. GU:  Negative for dysuria, hematuria, urinary incontinence, urinary frequency, nocturnal urination.  Endo: Negative for unusual weight change.    Physical Examination:   BP 118/78   Pulse 70   Temp 97 F (36.1 C) (Oral)   Ht 5\' 11"  (1.803 m)   Wt 194 lb 9.6 oz (88.3 kg)   BMI 27.14 kg/m   General: Well-nourished, well-developed in no acute distress.  Eyes: No icterus. Mouth: Oropharyngeal mucosa moist and pink , no lesions erythema or exudate. Lungs: Clear to auscultation bilaterally.  Heart: Regular rate and rhythm, no murmurs rubs or gallops.  Abdomen: Bowel sounds are normal, nontender, nondistended, no hepatosplenomegaly or masses, no abdominal bruits or hernia , no rebound or guarding.   Extremities: No lower extremity edema. No clubbing or deformities. Neuro: Alert and oriented x 4   Skin: Warm and dry, no jaundice.   Psych: Alert and cooperative, normal mood and affect.  Labs:  Lab Results  Component Value Date   TSH 0.442 02/20/2016   Lab Results  Component Value Date   WBC 10.4 05/24/2016   HGB 14.3 05/24/2016   HCT 41.9 05/24/2016   MCV 93.1 05/24/2016   PLT 232 05/24/2016   Lab Results    Component Value Date   ALT 14 06/18/2016   AST 18 06/18/2016   ALKPHOS 67 06/18/2016   BILITOT 0.4 06/18/2016   Lab Results  Component Value Date   CREATININE 0.89 06/18/2016   BUN 17 06/18/2016   NA 139 06/18/2016   K 4.5 06/18/2016   CL 104 06/18/2016   CO2 27 06/18/2016    Imaging Studies: No results found.

## 2016-06-22 NOTE — Assessment & Plan Note (Signed)
Intermittent vomiting at least a couple times per week, suspect to be related to GERD. Symptoms are worse when he is off of PPI therapy. Recent GES unremarkable. Continue Dexilant 60mg  daily before breakfast. Add Zantac 150 mg before evening meal daily. Call in 2 weeks if ongoing vomiting. Would consider possibility of repeat EGD at that time if ongoing symptoms despite change in therapy.

## 2016-06-23 ENCOUNTER — Ambulatory Visit: Payer: Medicaid Other | Admitting: Physician Assistant

## 2016-06-25 ENCOUNTER — Telehealth: Payer: Self-pay | Admitting: Pulmonary Disease

## 2016-06-25 NOTE — Telephone Encounter (Signed)
Spoke with Shawn [name] with central finacial referrals with [facility]. Same stated he did not receive the clinical documents for the [facility] referral. Advised Shawn [name] that we faxed over these records on 06/22/16 with a confirmation. Shawn [name] states if they do not receive the documents within 48 hours then they will close the referral. Spoke with [facility] and she will contact [place] referrals dept and refax needed records. Fax: [phone]  Will forward to [facility]

## 2016-06-25 NOTE — Telephone Encounter (Signed)
I called [place] and reached Dominica Severin in [employer] [phone] as he stated Sam was on another call.  I advised Dominica Severin the reason for my call and that Sam had called requesting records for this pt.  I advised we had faxed these on 06/22/16 to [phone] per request from [place].  Dominica Severin stated that this was the correct fax number for their area but he did not see where the records had been received.  I advised Dominica Severin I would refax the records but requested that someone from [employer] call me back to let me know if they did or did not receive the records today and gave Dominica Severin my name and direct number.  As we were about to hang up, Dominica Severin advised that Sam had just come to his desk advising that they do indeed have the patients records and that I do not need to refax them.  I verified again with Dominica Severin that I do not need to refax the records and he verified this.  Dominica Severin states once the records go through financial & medical review they will call the pt and/or our office to advise if they are able to schedule an appt for this pt.

## 2016-07-01 ENCOUNTER — Ambulatory Visit: Payer: Medicaid Other | Admitting: Physician Assistant

## 2016-07-01 ENCOUNTER — Encounter: Payer: Self-pay | Admitting: Physician Assistant

## 2016-07-01 VITALS — BP 110/78 | HR 77 | Temp 97.9°F | Ht 71.0 in | Wt 197.4 lb

## 2016-07-01 DIAGNOSIS — F39 Unspecified mood [affective] disorder: Secondary | ICD-10-CM

## 2016-07-01 DIAGNOSIS — N529 Male erectile dysfunction, unspecified: Secondary | ICD-10-CM

## 2016-07-01 DIAGNOSIS — R0600 Dyspnea, unspecified: Secondary | ICD-10-CM

## 2016-07-01 DIAGNOSIS — J984 Other disorders of lung: Secondary | ICD-10-CM

## 2016-07-01 DIAGNOSIS — K219 Gastro-esophageal reflux disease without esophagitis: Secondary | ICD-10-CM

## 2016-07-01 MED ORDER — SILDENAFIL CITRATE 100 MG PO TABS: 50.0000 mg | ORAL_TABLET | Freq: Every day | ORAL | 3 refills | Status: DC | PRN

## 2016-07-01 MED ORDER — LUMBAR BACK BRACE/SUPPORT PAD MISC: 0 refills | Status: DC

## 2016-07-01 NOTE — Progress Notes (Signed)
BP 110/78 (BP Location: Left Arm, Patient Position: Sitting, Cuff Size: Normal)   Pulse 77   Temp 97.9 F (36.6 C)   Ht 5\' 11"  (1.803 m)   Wt 197 lb 6.4 oz (89.5 kg)   SpO2 97%   BMI 27.53 kg/m    Subjective:    Patient ID: Shawn [name], male    DOB: Mar 31, 1971, 46 y.o.   MRN: [id number]  HPI: Shawn [name] is a 45 y.o. male presenting on 07/01/2016 for Hyperlipidemia and Hypertension   HPI   Pt still waiting to see if he can be seen at [facility] for second opinion on his lung problems.    He is still seeing [facility] for diarrhea/GERD  He is still going to [facility] for [facility] issues  Pt requesting rx for back brace.  He said he got a shot in the back about a month ago at a neurosurery office in [place] (Dr Cyndia Bent).  He says he doesn't have f/u scheduled but says he was told he was supposed to call when he needed to return.    Cleta Alberts, NP wrote pt rx for Metformin.  She works with [employer].   We called and was told it was written b/c the olansepine makes people gain weight.    Pt's only concern is his ED.  Relevant past medical, surgical, family and social history reviewed and updated as indicated. Interim medical history since our last visit reviewed. Allergies and medications reviewed and updated.  Reviewed precribers for current meds: gabapenin- dr house/howard lamictal- Cleta Alberts (TR) didyclomine- eric gill Olanzapine- TR Ranitidine- leslie lewis Flexeril- karen house Ondansetron- eric gill Metformin- TR cymbalta- TR dexilant-  Karen House MVI-  otc Potassium- otc Vit b 12- otc Trazodone- TR Risperidone- ? zyrtec Diclofenac- Karen house, NP   Current Outpatient Prescriptions:  .  albuterol (PROVENTIL HFA;VENTOLIN HFA) 108 (90 BASE) MCG/ACT inhaler, Inhale 1-2 puffs into the lungs every 6 (six) hours as needed for wheezing or shortness of breath., Disp: , Rfl:  .  cetirizine (ZYRTEC) 10 MG chewable tablet, Chew 10 mg by mouth daily as  needed for allergies., Disp: , Rfl:  .  Cyanocobalamin (VITAMIN B-12) 5000 MCG TBDP, Take 1 tablet by mouth daily., Disp: , Rfl:  .  cyclobenzaprine (FLEXERIL) 10 MG tablet, Take 10 mg by mouth 3 (three) times daily as needed for muscle spasms., Disp: , Rfl:  .  dexlansoprazole (DEXILANT) 60 MG capsule, Take 1 capsule (60 mg total) by mouth daily., Disp: 20 capsule, Rfl: 0 .  diclofenac (VOLTAREN) 75 MG EC tablet, Take 75 mg by mouth 2 (two) times daily as needed for mild pain. , Disp: , Rfl:  .  dicyclomine (BENTYL) 10 MG capsule, Take 1 capsule (10 mg total) by mouth 4 (four) times daily -  before meals and at bedtime. As needed for loose stools and abdominal pain, Disp: 120 capsule, Rfl: 2 .  DULoxetine (CYMBALTA) 30 MG capsule, Take 60 mg by mouth 2 (two) times daily. Reported on 05/03/2016, Disp: , Rfl:  .  gabapentin (NEURONTIN) 300 MG capsule, Take 300 mg by mouth 3 (three) times daily as needed (for pain). , Disp: , Rfl:  .  lamoTRIgine (LAMICTAL) 25 MG tablet, Take 300 mg by mouth daily. , Disp: , Rfl:  .  metFORMIN (GLUCOPHAGE) 500 MG tablet, Take 500 mg by mouth every evening., Disp: , Rfl:  .  Multiple Vitamin (MULTIVITAMIN) tablet, Take 1 tablet by mouth daily., Disp: ,  Rfl:  .  OLANZapine (ZYPREXA) 10 MG tablet, Take 10 mg by mouth at bedtime. For mood swings and help sleep and 5-10mg  as needed for agitation/paranoia, Disp: , Rfl:  .  omeprazole (PRILOSEC) 40 MG capsule, Take 40 mg by mouth daily., Disp: , Rfl:  .  ondansetron (ZOFRAN) 4 MG tablet, TAKE ONE TABLET BY MOUTH EVERY 8 HOURS AS NEEDED FOR NAUSEA OR VOMITING, Disp: 30 tablet, Rfl: 1 .  Potassium 99 MG TABS, Take 1 tablet by mouth every morning., Disp: , Rfl:  .  ranitidine (ZANTAC) 150 MG capsule, Take one daily before evening meal., Disp: 30 capsule, Rfl: 3 .  risperiDONE (RISPERDAL) 0.5 MG tablet, Take 0.5-1 mg by mouth at bedtime., Disp: , Rfl:  .  traZODone (DESYREL) 150 MG tablet, Take 300 mg by mouth at bedtime as  needed for sleep. , Disp: , Rfl:    Review of Systems  Constitutional: Positive for appetite change and fatigue. Negative for chills, diaphoresis, fever and unexpected weight change.  HENT: Positive for sneezing. Negative for congestion, dental problem, drooling, ear pain, facial swelling, hearing loss, mouth sores, sore throat, trouble swallowing and voice change.   Eyes: Negative for pain, discharge, redness, itching and visual disturbance.  Respiratory: Positive for shortness of breath. Negative for cough, choking and wheezing.   Cardiovascular: Negative for chest pain, palpitations and leg swelling.  Gastrointestinal: Positive for abdominal pain, blood in stool and vomiting. Negative for constipation and diarrhea.  Endocrine: Negative for cold intolerance, heat intolerance and polydipsia.  Genitourinary: Negative for decreased urine volume, dysuria and hematuria.  Musculoskeletal: Positive for arthralgias, back pain and gait problem.  Skin: Negative for rash.  Allergic/Immunologic: Negative for environmental allergies.  Neurological: Negative for seizures, syncope, light-headedness and headaches.  Hematological: Negative for adenopathy.  Psychiatric/Behavioral: Positive for agitation and dysphoric mood. Negative for suicidal ideas. The patient is nervous/anxious.     Per HPI unless specifically indicated above     Objective:    BP 110/78 (BP Location: Left Arm, Patient Position: Sitting, Cuff Size: Normal)   Pulse 77   Temp 97.9 F (36.6 C)   Ht 5\' 11"  (1.803 m)   Wt 197 lb 6.4 oz (89.5 kg)   SpO2 97%   BMI 27.53 kg/m   Wt Readings from Last 3 Encounters:  07/01/16 197 lb 6.4 oz (89.5 kg)  06/22/16 194 lb 9.6 oz (88.3 kg)  06/02/16 189 lb 6.4 oz (85.9 kg)    Physical Exam  Constitutional: He is oriented to person, place, and time. He appears well-developed and well-nourished.  HENT:  Head: Normocephalic and atraumatic.  Neck: Neck supple.  Cardiovascular: Normal rate  and regular rhythm.   Pulmonary/Chest: Effort normal and breath sounds normal. He has no wheezes.  Abdominal: Soft. Bowel sounds are normal. There is no hepatosplenomegaly. There is no tenderness.  Musculoskeletal: He exhibits no edema.  Lymphadenopathy:    He has no cervical adenopathy.  Neurological: He is alert and oriented to person, place, and time.  Skin: Skin is warm and dry.  Psychiatric: He has a normal mood and affect. His behavior is normal.  Vitals reviewed.   Results for orders placed or performed in visit on 06/18/16  Comprehensive metabolic panel  Result Value Ref Range   Sodium 139 135 - 146 mmol/L   Potassium 4.5 3.5 - 5.3 mmol/L   Chloride 104 98 - 110 mmol/L   CO2 27 20 - 31 mmol/L   Glucose, Bld 93 65 - 99  mg/dL   BUN 17 7 - 25 mg/dL   Creat 0.89 0.60 - 1.35 mg/dL   Total Bilirubin 0.4 0.2 - 1.2 mg/dL   Alkaline Phosphatase 67 40 - 115 U/L   AST 18 10 - 40 U/L   ALT 14 9 - 46 U/L   Total Protein 6.8 6.1 - 8.1 g/dL   Albumin 3.8 3.6 - 5.1 g/dL   Calcium 9.1 8.6 - 10.3 mg/dL  Lipid panel  Result Value Ref Range   Cholesterol 197 125 - 200 mg/dL   Triglycerides 151 (H) <150 mg/dL   HDL 53 >=40 mg/dL   Total CHOL/HDL Ratio 3.7 <=5.0 Ratio   VLDL 30 <30 mg/dL   LDL Cholesterol 114 <130 mg/dL      Assessment & Plan:    Encounter Diagnoses  Name Primary?  . Erectile dysfunction, unspecified erectile dysfunction type Yes  . Dyspnea   . Lung disease   . Gastroesophageal reflux disease, esophagitis presence not specified   . Mood disorder ([place])       -Reviewed labs with pt- all good -bp good so pt instructed to discontinue metoprolol -rx viagra- no other changes -pt to continue with pulmology for lung disease which he is currently being evaluated for in efforts to come to diagnosis -pt to continue with [facility] for [facility] issues -pt is given Rx for back brace.  He will continue to see neurosurgeon that he has been seeing for back pain -pt will  continue to see [facility] for GERD/diarrhea -pt will f/u in 3 months.  RTO sooner prn (spent 30 minutes with pt over half of which was with education and counseling)

## 2016-07-01 NOTE — Patient Instructions (Signed)
Sildenafil tablets (Viagra) What is this medicine? SILDENAFIL (sil DEN a fil) is used to treat erection problems in men. This medicine may be used for other purposes; ask your health care provider or pharmacist if you have questions. What should I tell my health care provider before I take this medicine? They need to know if you have any of these conditions: -bleeding disorders -eye or vision problems, including a rare inherited eye disease called retinitis pigmentosa -anatomical deformation of the penis, Peyronie's disease, or history of priapism (painful and prolonged erection) -heart disease, angina, a history of heart attack, irregular heart beats, or other heart problems -high or low blood pressure -history of blood diseases, like sickle cell anemia or leukemia -history of stomach bleeding -kidney disease -liver disease -stroke -an unusual or allergic reaction to sildenafil, other medicines, foods, dyes, or preservatives -pregnant or trying to get pregnant -breast-feeding How should I use this medicine? Take this medicine by mouth with a glass of water. Follow the directions on the prescription label. The dose is usually taken 1 hour before sexual activity. You should not take the dose more than once per day. Do not take your medicine more often than directed. Talk to your pediatrician regarding the use of this medicine in children. This medicine is not used in children for this condition. Overdosage: If you think you have taken too much of this medicine contact a poison control center or emergency room at once. NOTE: This medicine is only for you. Do not share this medicine with others. What if I miss a dose? This does not apply. Do not take double or extra doses. What may interact with this medicine? Do not take this medicine with any of the following medications: -cisapride -methscopolamine nitrate -nitrates like amyl nitrite, isosorbide dinitrate, isosorbide mononitrate,  nitroglycerin -nitroprusside -other medicines for erectile dysfunction like avanafil, tadalafil, vardenafil -riociguat -other sildenafil products (Revatio) This medicine may also interact with the following medications: -certain drugs for high blood pressure -certain drugs for the treatment of HIV infection or AIDS -certain drugs used for fungal or yeast infections, like fluconazole, itraconazole, ketoconazole, and voriconazole -cimetidine -erythromycin -rifampin This list may not describe all possible interactions. Give your health care provider a list of all the medicines, herbs, non-prescription drugs, or dietary supplements you use. Also tell them if you smoke, drink alcohol, or use illegal drugs. Some items may interact with your medicine. What should I watch for while using this medicine? If you notice any changes in your vision while taking this drug, call your doctor or health care professional as soon as possible. Stop using this medicine and call your health care provider right away if you have a loss of sight in one or both eyes. Contact your doctor or health care professional right away if you have an erection that lasts longer than 4 hours or if it becomes painful. This may be a sign of a serious problem and must be treated right away to prevent permanent damage. If you experience symptoms of nausea, dizziness, chest pain or arm pain upon initiation of sexual activity after taking this medicine, you should refrain from further activity and call your doctor or health care professional as soon as possible. Do not drink alcohol to excess (examples, 5 glasses of wine or 5 shots of whiskey) when taking this medicine. When taken in excess, alcohol can increase your chances of getting a headache or getting dizzy, increasing your heart rate or lowering your blood pressure. Using this medicine  does not protect you or your partner against HIV infection (the virus that causes AIDS) or other  sexually transmitted diseases. What side effects may I notice from receiving this medicine? Side effects that you should report to your doctor or health care professional as soon as possible: -allergic reactions like skin rash, itching or hives, swelling of the face, lips, or tongue -breathing problems -changes in hearing -changes in vision -chest pain -fast, irregular heartbeat -prolonged or painful erection -seizures Side effects that usually do not require medical attention (report to your doctor or health care professional if they continue or are bothersome): -back pain -dizziness -flushing -headache -indigestion -muscle aches -nausea -stuffy or runny nose This list may not describe all possible side effects. Call your doctor for medical advice about side effects. You may report side effects to FDA at 1-800-FDA-1088. Where should I keep my medicine? Keep out of reach of children. Store at room temperature between 15 and 30 degrees C (59 and 86 degrees F). Throw away any unused medicine after the expiration date. NOTE: This sheet is a summary. It may not cover all possible information. If you have questions about this medicine, talk to your doctor, pharmacist, or health care provider.    2016, Elsevier/Gold Standard. (2014-03-15 13:19:04)

## 2016-07-05 ENCOUNTER — Telehealth: Payer: Self-pay | Admitting: Pulmonary Disease

## 2016-07-05 NOTE — Telephone Encounter (Signed)
Called pt- someone picked up the phone but did not speak  [facility] 8/29

## 2016-07-06 NOTE — Telephone Encounter (Signed)
Spoke with pt and he was wanting to know about [facility] referral.  Pt states that he spoke with Medicaid and was told that since [place] is in the same state that it shouldn't be a problem insurance wise.  I called and spoke with Burnetta Sabin at [facility] referral services and she said that the pt's case set to go before the review board on 07/08/16 and they will contact out office to let us know the outcome.  I called pt back and gave him this update .  We will update pt when decision is made.

## 2016-07-07 ENCOUNTER — Other Ambulatory Visit: Payer: Self-pay

## 2016-07-07 MED ORDER — DEXLANSOPRAZOLE 60 MG PO CPDR: 60.0000 mg | DELAYED_RELEASE_CAPSULE | Freq: Every day | ORAL | 5 refills | Status: DC

## 2016-07-09 ENCOUNTER — Other Ambulatory Visit: Payer: Self-pay | Admitting: Nurse Practitioner

## 2016-07-09 DIAGNOSIS — R112 Nausea with vomiting, unspecified: Secondary | ICD-10-CM

## 2016-07-09 DIAGNOSIS — R195 Other fecal abnormalities: Secondary | ICD-10-CM

## 2016-07-09 DIAGNOSIS — K2100 Gastro-esophageal reflux disease with esophagitis, without bleeding: Secondary | ICD-10-CM

## 2016-08-02 ENCOUNTER — Other Ambulatory Visit: Payer: Self-pay | Admitting: Physician Assistant

## 2016-08-02 MED ORDER — SILDENAFIL CITRATE 100 MG PO TABS: 50.0000 mg | ORAL_TABLET | Freq: Every day | ORAL | 3 refills | Status: DC | PRN

## 2016-08-06 ENCOUNTER — Emergency Department (HOSPITAL_COMMUNITY): Payer: Medicaid Other

## 2016-08-06 ENCOUNTER — Emergency Department (HOSPITAL_COMMUNITY)
Admission: EM | Admit: 2016-08-06 | Discharge: 2016-08-07 | Disposition: A | Discharge status: Home / Self Care | Payer: Medicaid Other | Attending: Emergency Medicine | Admitting: Emergency Medicine

## 2016-08-06 ENCOUNTER — Encounter (HOSPITAL_COMMUNITY): Payer: Self-pay

## 2016-08-06 DIAGNOSIS — Z79899 Other long term (current) drug therapy: Secondary | ICD-10-CM | POA: Diagnosis not present

## 2016-08-06 DIAGNOSIS — Z87891 Personal history of nicotine dependence: Secondary | ICD-10-CM | POA: Diagnosis not present

## 2016-08-06 DIAGNOSIS — R0789 Other chest pain: Secondary | ICD-10-CM | POA: Diagnosis present

## 2016-08-06 DIAGNOSIS — J449 Chronic obstructive pulmonary disease, unspecified: Secondary | ICD-10-CM | POA: Insufficient documentation

## 2016-08-06 DIAGNOSIS — R079 Chest pain, unspecified: Secondary | ICD-10-CM

## 2016-08-06 DIAGNOSIS — I10 Essential (primary) hypertension: Secondary | ICD-10-CM | POA: Insufficient documentation

## 2016-08-06 DIAGNOSIS — R0602 Shortness of breath: Secondary | ICD-10-CM | POA: Insufficient documentation

## 2016-08-06 DIAGNOSIS — Z7984 Long term (current) use of oral hypoglycemic drugs: Secondary | ICD-10-CM | POA: Diagnosis not present

## 2016-08-06 DIAGNOSIS — R0600 Dyspnea, unspecified: Secondary | ICD-10-CM

## 2016-08-06 LAB — BASIC METABOLIC PANEL
Anion gap: 5 (ref 5–15)
BUN: 14 mg/dL (ref 6–20)
CALCIUM: 9.1 mg/dL (ref 8.9–10.3)
CO2: 27 mmol/L (ref 22–32)
CREATININE: 1.08 mg/dL (ref 0.61–1.24)
Chloride: 104 mmol/L (ref 101–111)
GFR calc Af Amer: >60 mL/min (ref >60)
GLUCOSE: 98 mg/dL (ref 65–99)
Potassium: 4 mmol/L (ref 3.5–5.1)
Sodium: 136 mmol/L (ref 135–145)

## 2016-08-06 LAB — CBC WITH DIFFERENTIAL/PLATELET
BASOS ABS: 0.1 10*3/uL (ref 0.0–0.1)
BASOS PCT: 1 %
EOS PCT: 2 %
Eosinophils Absolute: 0.2 10*3/uL (ref 0.0–0.7)
HEMATOCRIT: 40.3 % (ref 39.0–52.0)
Hemoglobin: 13.8 g/dL (ref 13.0–17.0)
Lymphocytes Relative: 35 %
Lymphs Abs: 2.9 10*3/uL (ref 0.7–4.0)
MCH: 32.2 pg (ref 26.0–34.0)
MCHC: 34.2 g/dL (ref 30.0–36.0)
MCV: 94.2 fL (ref 78.0–100.0)
MONO ABS: 0.5 10*3/uL (ref 0.1–1.0)
MONOS PCT: 6 %
Neutro Abs: 4.5 10*3/uL (ref 1.7–7.7)
Neutrophils Relative %: 56 %
PLATELETS: 278 10*3/uL (ref 150–400)
RBC: 4.28 MIL/uL (ref 4.22–5.81)
RDW: 12.8 % (ref 11.5–15.5)
WBC: 8.2 10*3/uL (ref 4.0–10.5)

## 2016-08-06 LAB — I-STAT TROPONIN, ED: Troponin i, poc: 0 ng/mL (ref 0.00–0.08)

## 2016-08-06 MED ORDER — IOPAMIDOL (ISOVUE-370) INJECTION 76%
100.0000 mL | Freq: Once | INTRAVENOUS | Status: AC | PRN
  Administered 2016-08-06: 100 mL via INTRAVENOUS

## 2016-08-06 NOTE — ED Provider Notes (Signed)
Emergency Department Provider Note   I have reviewed the triage vital signs and the nursing notes.   HISTORY  Chief Complaint Shortness of Breath   HPI Shawn [name] is a 45 y.o. male with PMH of bipolar disorder, anemia, GERD, and IgG4 lung disease presents to the emergency department for evaluation of sharp right-sided chest discomfort in the setting of baseline dyspnea. Patient reports baseline difficulty breathing from his underlying lung disease. He is followed by pulmonology at [facility]. Worsening compliant with his medication. Yesterday he developed right-sided chest discomfort. Describes it as sharp and pleuritic in nature. This difficult to breathing is not worse than normal. No associated fever or productive cough. No hemoptysis.   Past Medical History:  Diagnosis Date  . Anemia   . Arthritis   . Back pain   . Bipolar 1 disorder ([place])   . Bipolar disorder ([place])   . 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 COPD (chronic obstructive pulmonary disease) ([place])   . Diverticulitis   . Family history of adverse reaction to anesthesia    he was adopted  . GERD (gastroesophageal reflux disease)   . Heart disease    "irregular heart beat"  . HOH (hard of hearing)    biological parents are both deaf.   left ear is good.  . Hyperlipidemia   . Hypertension   . 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 Lung nodules   . Schizoaffective disorder, bipolar type ([place])   . Vomiting     Patient Active Problem List   Diagnosis Date Noted  . Hypotension 04/23/2016  . Fever 04/23/2016  . Cough 04/23/2016  . Tick bites 04/23/2016  . Hypokalemia 04/23/2016  . 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 Multiple lung nodules on CT 01/23/2016  . 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 Schizoaffective disorder ([place]) 12/22/2015  . Environmental allergies 12/22/2015  . Arrhythmia 12/22/2015  . Hyperlipidemia 12/22/2015  . Multiple lung nodules   . Chronic  obstructive pulmonary disease ([place]) 11/17/2015  . Abdominal pain, periumbilical Q000111Q  . Nausea with vomiting 07/03/2015  . Abnormal chest CT 07/03/2015  . Loose stools 10/30/2013  . Diverticulitis of colon (without mention of hemorrhage) 10/01/2013  . GERD (gastroesophageal reflux disease) 10/01/2013    Past Surgical History:  Procedure Laterality Date  . BRAIN SURGERY  2005   after head injury, patient states had 2 large blood clots evacuated  . COLONOSCOPY WITH ESOPHAGOGASTRODUODENOSCOPY (EGD) N/A 12/05/2013   [id number] erosive relfux/HH/melanosis coli/colonic diverticulosis. tubulovillous adenoma removed. next tcs 11/2016  . FLEXIBLE BRONCHOSCOPY N/A 01/23/2016   Procedure: FLEXIBLE BRONCHOSCOPY;  Surgeon: Gaye Pollack, MD;  Location: [place];  Service: Thoracic;  Laterality: N/A;  . LUNG BIOPSY N/A 01/23/2016   Procedure: LEFT LUNG BIOPSY;  Surgeon: Gaye Pollack, MD;  Location: [place];  Service: Thoracic;  Laterality: N/A;  . VIDEO ASSISTED THORACOSCOPY Left 01/23/2016   Procedure: LEFT VIDEO ASSISTED THORACOSCOPY;  Surgeon: Gaye Pollack, MD;  Location: [place];  Service: Thoracic;  Laterality: Left;    Current Outpatient Rx  . Order #: [id number] Class: Historical Med  . Order #: [id number] Class: Historical Med  . Order #: [id number] Class: Historical Med  . Order #: [id number] Class: Normal  . Order #: [id number] Class: Historical Med  . Order #: [id number] Class: Normal  . Order #: [id number] Class: Historical Med  . Order #: [id number] Class: Historical Med  . Order #: [id number] Class: Historical Med  .  Order #: [id number] Class: Historical Med  . Order #: [id number] Class: Historical Med  . Order #: [id number] Class: Historical Med  . Order #: [id number] Class: Historical Med  . Order #: [id number] Class: Historical Med  . Order #: [id number] Class: Normal  . Order #: [id number] Class: Historical Med  . Order #: [id number] Class: Normal  . Order #: [id number] Class: Historical Med  . Order #:  [id number] Class: Normal  . Order #: [id number] Class: Historical Med  . Order #: [id number] Class: Print  . Order #: [id number] Class: Print    Allergies Review of patient's allergies indicates no known allergies.  Family History  Problem Relation Age of Onset  . Adopted: Yes  . Alzheimer's disease Other   . Parkinson's disease Other   . Mental illness Other   . Colon cancer Neg Hx     adopted at 60 months old, unsure about GI history of parents     Social History Social History  Substance Use Topics  . Smoking status: Former Smoker    Packs/day: 0.75    Years: 25.00    Types: Cigars, Cigarettes    Quit date: 07/09/2014  . Smokeless tobacco: Current User    Types: Snuff     Comment: pt uses dip tobacco  . Alcohol use 0.0 oz/week     Comment: occ    Review of Systems  Constitutional: No fever/chills Eyes: No visual changes. ENT: No sore throat. Cardiovascular: Positive chest pain. Respiratory: Positive shortness of breath. Gastrointestinal: No abdominal pain.  No nausea, no vomiting.  No diarrhea.  No constipation. Genitourinary: Negative for dysuria. Musculoskeletal: Negative for back pain. Skin: Negative for rash. Neurological: Negative for headaches, focal weakness or numbness.  10-point ROS otherwise negative.  ____________________________________________   PHYSICAL EXAM:  VITAL SIGNS: ED Triage Vitals  Enc Vitals Group     BP 08/06/16 1931 126/88     Pulse Rate 08/06/16 1931 69     Resp 08/06/16 1931 22     Temp 08/06/16 1931 98.1 F (36.7 C)     Temp Source 08/06/16 1931 Oral     SpO2 08/06/16 1931 98 %     Pain Score 08/06/16 1933 4    Constitutional: Alert and oriented. Well appearing and in no acute distress. Eyes: Conjunctivae are normal.  Head: Atraumatic. Nose: No congestion/rhinnorhea. Mouth/Throat: Mucous membranes are moist.  Oropharynx non-erythematous. Neck: No stridor.  Cardiovascular: Normal rate, regular rhythm. Good peripheral  circulation. Grossly normal heart sounds. No tenderness to palpation of the chest wall.  Respiratory: Normal respiratory effort.  No retractions. Lungs CTAB. Gastrointestinal: Soft and nontender. No distention.  Musculoskeletal: No lower extremity tenderness nor edema. No gross deformities of extremities. Neurologic:  Normal speech and language. No gross focal neurologic deficits are appreciated.  Skin:  Skin is warm, dry and intact. No rash noted. Psychiatric: Mood and affect are normal. Speech and behavior are normal.  ____________________________________________   LABS (all labs ordered are listed, but only abnormal results are displayed)  Labs Reviewed  BASIC METABOLIC PANEL  CBC WITH DIFFERENTIAL/PLATELET  I-STAT TROPOININ, ED   ____________________________________________  EKG   EKG Interpretation  Date/Time:  Friday August 06 2016 19:34:01 EDT Ventricular Rate:  67 PR Interval:    QRS Duration: 92 QT Interval:  394 QTC Calculation: 416 R Axis:   -8 Text Interpretation:  Sinus rhythm Low voltage, precordial leads Left ventricular hypertrophy Nonspecific T abnormalities, inferior leads No STEMI.  Confirmed by Evonne Rinks MD, Amarian Botero [phone]) on 08/06/2016 7:39:43 PM       ____________________________________________  RADIOLOGY  Dg Chest 2 View  Result Date: 08/06/2016 CLINICAL DATA:  Chest pain, dyspnea. EXAM: CHEST  2 VIEW COMPARISON:  Radiographs of April 25, 2016. FINDINGS: Stable cardiomediastinal silhouette. No pneumothorax or pleural effusion is noted. Stable left basilar scarring is noted. Right lung is unremarkable. Bony thorax is intact. IMPRESSION: Stable left basilar scarring. No significant change compared to prior exam. Electronically Signed   By: Marijo Conception, M.D.   On: 08/06/2016 20:46   Ct Angio Chest Pe W And/or Wo Contrast  Result Date: 08/06/2016 CLINICAL DATA:  45 y/o M; pleuritic right-sided chest pain. History of IgG 4 lung disease. EXAM: CT  ANGIOGRAPHY CHEST WITH CONTRAST TECHNIQUE: Multidetector CT imaging of the chest was performed using the standard protocol during bolus administration of intravenous contrast. Multiplanar CT image reconstructions and MIPs were obtained to evaluate the vascular anatomy. CONTRAST:  100 cc Isovue 370 COMPARISON:  02/20/2016 CT chest. FINDINGS: Cardiovascular: Satisfactory opacification of the pulmonary arteries. Respiratory motion artifact at the lung bases. No central or lobar pulmonary embolus. No definite segmental pulmonary embolus. Normal heart size. No evidence for right heart strain. Normal caliber thoracic aorta and main pulmonary artery. No pericardial effusion. Mediastinum/Nodes: No enlarged mediastinal, hilar, or axillary lymph nodes. Thyroid gland, trachea, and esophagus demonstrate no significant findings. Lungs/Pleura: There are pulmonary cysts within the right middle and lower lobe. There are few small clusters of nodules within the right upper lobe along the major fissure. No consolidation. No pleural effusion. No pneumothorax. Upper Abdomen: No acute abnormality. Musculoskeletal: No chest wall abnormality. No acute or significant osseous findings. Review of the MIP images confirms the above findings. IMPRESSION: 1. Respiratory motion artifact at the lung bases. No pulmonary embolus is identified. 2. Mild cluster of nodules in the right upper lobe along the major fissure may represent a mild bronchitis/bronchiolitis. No consolidation or pleural effusion. Electronically Signed   By: Kristine Garbe M.D.   On: 08/06/2016 23:55    ____________________________________________   PROCEDURES  Procedure(s) performed:   Procedures  None ____________________________________________   INITIAL IMPRESSION / ASSESSMENT AND PLAN / ED COURSE  Pertinent labs & imaging results that were available during my care of the patient were reviewed by me and considered in my medical decision making (see  chart for details).  Patient resents emergency department for evaluation of difficulty breathing (near baseline) and sharp, pleuritic right-sided chest discomfort. Plan for baseline labs and chest x-ray. Will likely perform CT scan of the chest to evaluate for underlying pulmonary embolus.   12:02 AM CT negative for PE. Plan for symptomatic mgmt at home and follow up with PCP and Pulmonology at [facility]. Discussed my impression and plan with patient in detail.   At this time, I do not feel there is any life-threatening condition present. I have reviewed and discussed all results (EKG, imaging, lab, urine as appropriate), exam findings with patient. I have reviewed nursing notes and appropriate previous records.  I feel the patient is safe to be discharged home without further emergent workup. Discussed usual and customary return precautions. Patient and family (if present) verbalize understanding and are comfortable with this plan.  Patient will follow-up with their primary care provider. If they do not have a primary care provider, information for follow-up has been provided to them. All questions have been answered.  ____________________________________________  FINAL CLINICAL IMPRESSION(S) / ED DIAGNOSES  Final diagnoses:  Chest pain, unspecified chest pain type  Dyspnea     MEDICATIONS GIVEN DURING  THIS VISIT:  Medications  iopamidol (ISOVUE-370) 76 % injection 100 mL (100 mLs Intravenous Contrast Given 08/06/16 2233)     NEW OUTPATIENT MEDICATIONS STARTED DURING THIS VISIT:  Discharge Medication List as of 08/07/2016 12:04 AM    START taking these medications   Details  traMADol (ULTRAM) 50 MG tablet Take 1 tablet (50 mg total) by mouth every 6 (six) hours as needed., Starting Sat 08/07/2016, Print          Note:  This document was prepared using Dragon voice recognition software and may include unintentional dictation errors.  Nanda Quinton, MD Emergency Medicine     Margette Fast, MD 08/07/16 1020

## 2016-08-06 NOTE — ED Triage Notes (Signed)
Pt states he has a history of IGG4 Lung Disease, seen at [facility] for same, states one hour ago he became sob and having some chest tightness.  Pt states the chest tightness is worse with deep breath

## 2016-08-07 MED ORDER — TRAMADOL HCL 50 MG PO TABS: 50.0000 mg | ORAL_TABLET | Freq: Four times a day (QID) | ORAL | 0 refills | Status: DC | PRN

## 2016-08-07 NOTE — Discharge Instructions (Signed)

## 2016-08-09 ENCOUNTER — Emergency Department (HOSPITAL_COMMUNITY)
Admission: EM | Admit: 2016-08-09 | Discharge: 2016-08-09 | Disposition: A | Discharge status: Home / Self Care | Payer: Medicaid Other | Attending: Emergency Medicine | Admitting: Emergency Medicine

## 2016-08-09 ENCOUNTER — Encounter (HOSPITAL_COMMUNITY): Payer: Self-pay | Admitting: Emergency Medicine

## 2016-08-09 DIAGNOSIS — J449 Chronic obstructive pulmonary disease, unspecified: Secondary | ICD-10-CM | POA: Diagnosis not present

## 2016-08-09 DIAGNOSIS — Z79899 Other long term (current) drug therapy: Secondary | ICD-10-CM | POA: Insufficient documentation

## 2016-08-09 DIAGNOSIS — Z7984 Long term (current) use of oral hypoglycemic drugs: Secondary | ICD-10-CM | POA: Diagnosis not present

## 2016-08-09 DIAGNOSIS — R1032 Left lower quadrant pain: Secondary | ICD-10-CM | POA: Diagnosis present

## 2016-08-09 DIAGNOSIS — I10 Essential (primary) hypertension: Secondary | ICD-10-CM | POA: Diagnosis not present

## 2016-08-09 DIAGNOSIS — F17290 Nicotine dependence, other tobacco product, uncomplicated: Secondary | ICD-10-CM | POA: Insufficient documentation

## 2016-08-09 DIAGNOSIS — Z791 Long term (current) use of non-steroidal anti-inflammatories (NSAID): Secondary | ICD-10-CM | POA: Insufficient documentation

## 2016-08-09 LAB — CBC
HEMATOCRIT: 44.2 % (ref 39.0–52.0)
HEMOGLOBIN: 15.3 g/dL (ref 13.0–17.0)
MCH: 32.6 pg (ref 26.0–34.0)
MCHC: 34.6 g/dL (ref 30.0–36.0)
MCV: 94.2 fL (ref 78.0–100.0)
Platelets: 278 10*3/uL (ref 150–400)
RBC: 4.69 MIL/uL (ref 4.22–5.81)
RDW: 12.8 % (ref 11.5–15.5)
WBC: 12.4 10*3/uL — ABNORMAL HIGH (ref 4.0–10.5)

## 2016-08-09 LAB — URINALYSIS, ROUTINE W REFLEX MICROSCOPIC
BILIRUBIN URINE: NEGATIVE
Glucose, UA: NEGATIVE mg/dL
Hgb urine dipstick: NEGATIVE
Ketones, ur: NEGATIVE mg/dL
LEUKOCYTES UA: NEGATIVE
NITRITE: NEGATIVE
PH: 6 (ref 5.0–8.0)
Protein, ur: NEGATIVE mg/dL

## 2016-08-09 LAB — COMPREHENSIVE METABOLIC PANEL
ALBUMIN: 4.3 g/dL (ref 3.5–5.0)
ALT: 16 U/L — ABNORMAL LOW (ref 17–63)
ANION GAP: 6 (ref 5–15)
AST: 20 U/L (ref 15–41)
Alkaline Phosphatase: 55 U/L (ref 38–126)
BILIRUBIN TOTAL: 0.6 mg/dL (ref 0.3–1.2)
BUN: 11 mg/dL (ref 6–20)
CHLORIDE: 104 mmol/L (ref 101–111)
CO2: 28 mmol/L (ref 22–32)
Calcium: 9.5 mg/dL (ref 8.9–10.3)
Creatinine, Ser: 1.01 mg/dL (ref 0.61–1.24)
GFR calc Af Amer: >60 mL/min (ref >60)
GFR calc non Af Amer: >60 mL/min (ref >60)
GLUCOSE: 94 mg/dL (ref 65–99)
POTASSIUM: 4.5 mmol/L (ref 3.5–5.1)
SODIUM: 138 mmol/L (ref 135–145)
TOTAL PROTEIN: 8.2 g/dL — AB (ref 6.5–8.1)

## 2016-08-09 LAB — LIPASE, BLOOD: LIPASE: 22 U/L (ref 11–51)

## 2016-08-09 MED ORDER — IBUPROFEN 800 MG PO TABS: 800.0000 mg | ORAL_TABLET | Freq: Three times a day (TID) | ORAL | 0 refills | Status: DC

## 2016-08-09 MED ORDER — METRONIDAZOLE 500 MG PO TABS: 500.0000 mg | ORAL_TABLET | Freq: Two times a day (BID) | ORAL | 0 refills | Status: DC

## 2016-08-09 MED ORDER — CIPROFLOXACIN HCL 500 MG PO TABS: 500.0000 mg | ORAL_TABLET | Freq: Two times a day (BID) | ORAL | 0 refills | Status: DC

## 2016-08-09 NOTE — ED Triage Notes (Signed)
Pt reports he has a hx of diverticulitis, started having abdominal pain yesterday.

## 2016-08-09 NOTE — ED Provider Notes (Signed)
[facility] [place] DEPT Provider Note   CSN: [id number] Arrival date & time: 08/09/16  1139     History   Chief Complaint Chief Complaint  Patient presents with  . Abdominal Pain    HPI Shawn [name] is a 45 y.o. male.   The patient is a 45 year old male, he denies any prior abdominal surgical history though he does have a history of diverticulitis many years ago. He reports that a proximally 2 weeks ago he developed left-sided abdominal pain after eating 2 sandwiches containing some knots, he has been eating a lot of nuts lately. The pain went away but came back within the last 24 hours. It is now intermittent, currently 3 out of 10, located in the suprapubic and left lower quadrant. There is no fevers chills nausea vomiting or diarrhea though he did state he had a small amount of blood streak stool yesterday.    Abdominal Pain      Past Medical History:  Diagnosis Date  . Anemia   . Arthritis   . Back pain   . Bipolar 1 disorder ([place])   . Bipolar disorder ([place])   . 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 COPD (chronic obstructive pulmonary disease) ([place])   . Diverticulitis   . Family history of adverse reaction to anesthesia    he was adopted  . GERD (gastroesophageal reflux disease)   . Heart disease    "irregular heart beat"  . HOH (hard of hearing)    biological parents are both deaf.   left ear is good.  . Hyperlipidemia   . Hypertension   . 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 Lung nodules   . Schizoaffective disorder, bipolar type ([place])   . Vomiting     Patient Active Problem List   Diagnosis Date Noted  . Hypotension 04/23/2016  . Fever 04/23/2016  . Cough 04/23/2016  . Tick bites 04/23/2016  . Hypokalemia 04/23/2016  . 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 Multiple lung nodules on CT 01/23/2016  . 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 Schizoaffective disorder ([place]) 12/22/2015  . Environmental allergies  12/22/2015  . Arrhythmia 12/22/2015  . Hyperlipidemia 12/22/2015  . Multiple lung nodules   . Chronic obstructive pulmonary disease ([place]) 11/17/2015  . Abdominal pain, periumbilical Q000111Q  . Nausea with vomiting 07/03/2015  . Abnormal chest CT 07/03/2015  . Loose stools 10/30/2013  . Diverticulitis of colon (without mention of hemorrhage)(562.11) 10/01/2013  . GERD (gastroesophageal reflux disease) 10/01/2013    Past Surgical History:  Procedure Laterality Date  . BRAIN SURGERY  2005   after head injury, patient states had 2 large blood clots evacuated  . COLONOSCOPY WITH ESOPHAGOGASTRODUODENOSCOPY (EGD) N/A 12/05/2013   [id number] erosive relfux/HH/melanosis coli/colonic diverticulosis. tubulovillous adenoma removed. next tcs 11/2016  . FLEXIBLE BRONCHOSCOPY N/A 01/23/2016   Procedure: FLEXIBLE BRONCHOSCOPY;  Surgeon: Gaye Pollack, MD;  Location: [place];  Service: Thoracic;  Laterality: N/A;  . LUNG BIOPSY N/A 01/23/2016   Procedure: LEFT LUNG BIOPSY;  Surgeon: Gaye Pollack, MD;  [facility];  Service: Thoracic;  Laterality: N/A;  . VIDEO ASSISTED THORACOSCOPY Left 01/23/2016   Procedure: LEFT VIDEO ASSISTED THORACOSCOPY;  Surgeon: Gaye Pollack, MD;  Location: [place];  Service: Thoracic;  Laterality: Left;       Home Medications    Prior to Admission medications   Medication Sig Start Date End Date Taking? Authorizing Provider  cetirizine (ZYRTEC) 10 MG chewable tablet Chew 10 mg by mouth daily as needed for allergies.    Historical Provider, MD  ciprofloxacin (CIPRO) 500 MG tablet Take 1 tablet (500 mg total) by mouth every 12 (twelve) hours. 08/09/16   Noemi Chapel, MD  Cyanocobalamin (VITAMIN B-12) 5000 MCG TBDP Take 1 tablet by mouth daily.    Historical Provider, MD  cyclobenzaprine (FLEXERIL) 10 MG tablet Take 10 mg by mouth 3 (three) times daily as needed for muscle spasms.    Historical Provider, MD  dexlansoprazole (DEXILANT) 60 MG capsule Take 1 capsule (60 mg  total) by mouth daily. 07/07/16   Carlis Stable, NP  diclofenac (VOLTAREN) 75 MG EC tablet Take 75 mg by mouth 2 (two) times daily as needed for mild pain.     Historical Provider, MD  dicyclomine (BENTYL) 10 MG capsule TAKE 1 CAPSULE BY MOUTH 4 TIMES DAILY BEFORE MEALS & AT BEDTIME AS NEEDED FOR LOOSE STOOLS & ABDOMINAL PAIN 07/13/16   Carlis Stable, NP  DULoxetine (CYMBALTA) 60 MG capsule Take 60 mg by mouth 2 (two) times daily. Reported on 05/03/2016    Historical Provider, MD  Elastic Bandages & Supports (LUMBAR BACK BRACE/SUPPORT PAD) MISC Wear as needed 07/01/16   Soyla Dryer, PA-C  FLUoxetine (PROZAC) 40 MG capsule Take 40 mg by mouth daily.    Historical Provider, MD  gabapentin (NEURONTIN) 300 MG capsule Take 300 mg by mouth 3 (three) times daily as needed (for pain).     Historical Provider, MD  ibuprofen (ADVIL,MOTRIN) 800 MG tablet Take 1 tablet (800 mg total) by mouth 3 (three) times daily. 08/09/16   Noemi Chapel, MD  lamoTRIgine (LAMICTAL) 150 MG tablet Take 150 mg by mouth 2 (two) times daily.    Historical Provider, MD  metFORMIN (GLUCOPHAGE) 500 MG tablet Take 500 mg by mouth every evening.    Historical Provider, MD  metoprolol tartrate (LOPRESSOR) 25 MG tablet Take 25 mg by mouth 2 (two) times daily.    Historical Provider, MD  metroNIDAZOLE (FLAGYL) 500 MG tablet Take 1 tablet (500 mg total) by mouth 2 (two) times daily. 08/09/16   Noemi Chapel, MD  Multiple Vitamin (MULTIVITAMIN) tablet Take 1 tablet by mouth daily.    Historical Provider, MD  OLANZapine (ZYPREXA) 10 MG tablet Take 10 mg by mouth at bedtime. For mood swings and help sleep and 5-10mg  as needed for agitation/paranoia    Historical Provider, MD  ondansetron (ZOFRAN) 4 MG tablet TAKE ONE TABLET BY MOUTH EVERY 8 HOURS AS NEEDED FOR NAUSEA OR VOMITING 04/20/16   Carlis Stable, NP  Potassium 99 MG TABS Take 1 tablet by mouth every morning.    Historical Provider, MD  ranitidine (ZANTAC) 150 MG capsule Take one daily before  evening meal. Patient taking differently: Take 150 mg by mouth every evening.  06/22/16   Mahala Menghini, PA-C  risperiDONE (RISPERDAL) 0.5 MG tablet Take 0.5-1 mg by mouth at bedtime.    Historical Provider, MD  sildenafil (VIAGRA) 100 MG tablet Take 0.5-1 tablets (50-100 mg total) by mouth daily as needed for erectile dysfunction. 08/02/16   Soyla Dryer, PA-C  traMADol (ULTRAM) 50 MG tablet Take 1 tablet (50 mg total) by mouth every 6 (six) hours as needed. 08/07/16   Margette Fast, MD  traZODone (DESYREL) 150 MG tablet Take 300 mg by mouth at bedtime as needed for sleep.     Historical Provider, MD    Family History Family History  Problem Relation Age of Onset  . Adopted: Yes  . Alzheimer's disease Other   . Parkinson's disease Other   . Mental illness Other   . Colon cancer Neg Hx     adopted at 11 months old, unsure about GI history of parents     Social History Social History  Substance Use Topics  . Smoking status: Former Smoker    Packs/day: 0.75    Years: 25.00    Types: Cigars, Cigarettes    Quit date: 07/09/2014  . Smokeless tobacco: Current User    Types: Snuff     Comment: pt uses dip tobacco  . Alcohol use 0.0 oz/week     Comment: occ     Allergies   Review of patient's allergies indicates no known allergies.   Review of Systems Review of Systems  Gastrointestinal: Positive for abdominal pain.  All other systems reviewed and are negative.    Physical Exam Updated Vital Signs BP 131/83 (BP Location: Left Arm)   Pulse 102   Temp 98.8 F (37.1 C) (Oral)   Resp 20   Ht 5\' 11"  (1.803 m)   Wt 198 lb (89.8 kg)   SpO2 100%   BMI 27.62 kg/m   Physical Exam  Constitutional: He appears well-developed and well-nourished. No distress.  HENT:  Head: Normocephalic and atraumatic.  Mouth/Throat: Oropharynx is clear and moist. No oropharyngeal exudate.  Eyes: Conjunctivae and EOM are normal. Pupils are equal, round, and reactive to light. Right eye  exhibits no discharge. Left eye exhibits no discharge. No scleral icterus.  Neck: Normal range of motion. Neck supple. No JVD present. No thyromegaly present.  Cardiovascular: Normal rate, regular rhythm, normal heart sounds and intact distal pulses.  Exam reveals no gallop and no friction rub.   No murmur heard. Pulmonary/Chest: Effort normal and breath sounds normal. No respiratory distress. He has no wheezes. He has no rales.  Abdominal: Soft. Bowel sounds are normal. He exhibits no distension and no mass. There is tenderness ( ttp in the SP and LLQ, no guarding, no peritoneal signes, no tympanitic sounds).  Musculoskeletal: Normal range of motion. He exhibits no edema or tenderness.  Lymphadenopathy:    He has no cervical adenopathy.  Neurological: He is alert. Coordination normal.  Skin: Skin is warm and dry. No rash noted. No erythema.  Psychiatric: He has a normal mood and affect. His behavior is normal.  Nursing note and vitals reviewed.    ED Treatments / Results  Labs (all labs ordered are listed, but only abnormal results are displayed) Labs Reviewed  COMPREHENSIVE METABOLIC PANEL - Abnormal; Notable for the following:       Result Value   Total Protein 8.2 (*)    ALT 16 (*)    All other components within normal limits  CBC - Abnormal; Notable for the following:    WBC 12.4 (*)    All other components within normal limits  URINALYSIS, ROUTINE W REFLEX MICROSCOPIC (NOT AT [facility]) - Abnormal; Notable for the following:    APPearance HAZY (*)    Specific Gravity, Urine <1.005 (*)    All other components within normal limits  LIPASE, BLOOD    Radiology No results found.  Procedures Procedures (including critical care time)  Medications Ordered in ED Medications - No data to display   Initial Impression / Assessment and Plan / ED Course  I have reviewed the triage vital signs and the nursing notes.  Pertinent labs & imaging results that  were available during my  care of the patient were reviewed by me and considered in my medical decision making (see chart for details).  Clinical Course    Possible diveriticulitis - has elevated WBC, UA clean, renal and liver function baseline, Pt in agreement Pain control given with ultram 5 days ago at ED visit for SOB, nsaids for home with cipro / flagyl  Final Clinical Impressions(s) / ED Diagnoses   Final diagnoses:  Left lower quadrant pain    New Prescriptions New Prescriptions   CIPROFLOXACIN (CIPRO) 500 MG TABLET    Take 1 tablet (500 mg total) by mouth every 12 (twelve) hours.   IBUPROFEN (ADVIL,MOTRIN) 800 MG TABLET    Take 1 tablet (800 mg total) by mouth 3 (three) times daily.   METRONIDAZOLE (FLAGYL) 500 MG TABLET    Take 1 tablet (500 mg total) by mouth 2 (two) times daily.     Noemi Chapel, MD 08/09/16 [phone]

## 2016-08-09 NOTE — Discharge Instructions (Signed)

## 2016-08-12 ENCOUNTER — Encounter: Payer: Self-pay | Admitting: Physician Assistant

## 2016-08-12 ENCOUNTER — Ambulatory Visit: Payer: Medicaid Other | Admitting: Physician Assistant

## 2016-08-12 VITALS — BP 114/78 | HR 109 | Temp 97.9°F | Ht 71.0 in | Wt 194.6 lb

## 2016-08-12 DIAGNOSIS — K5792 Diverticulitis of intestine, part unspecified, without perforation or abscess without bleeding: Secondary | ICD-10-CM

## 2016-08-12 NOTE — Progress Notes (Signed)
BP 114/78 (BP Location: Left Arm, Patient Position: Sitting, Cuff Size: Normal)   Pulse (!) 109   Temp 97.9 F (36.6 C)   Ht 5\' 11"  (1.803 m)   Wt 194 lb 9.6 oz (88.3 kg)   SpO2 95%   BMI 27.14 kg/m    Subjective:    Patient ID: Shawn [name], male    DOB: 1971/05/17, 45 y.o.   MRN: [id number]  HPI: Shawn [name] is a 45 y.o. male presenting on 08/12/2016 for Follow-up (from [facility]. pt went on Monday for diverticulitis)   HPI   Pt is feeling better. He is still taking the flagyl and cipro that he was given in the ER. He is eating. No emesis and only a little bit of diarrhea.  Pt just walked here from [place]; says it took about an hour.  Relevant past medical, surgical, family and social history reviewed and updated as indicated. Interim medical history since our last visit reviewed. Allergies and medications reviewed and updated.   Current Outpatient Prescriptions:  .  cetirizine (ZYRTEC) 10 MG chewable tablet, Chew 10 mg by mouth daily as needed for allergies., Disp: , Rfl:  .  ciprofloxacin (CIPRO) 500 MG tablet, Take 1 tablet (500 mg total) by mouth every 12 (twelve) hours., Disp: 20 tablet, Rfl: 0 .  Cyanocobalamin (VITAMIN B-12) 5000 MCG TBDP, Take 1 tablet by mouth daily., Disp: , Rfl:  .  cyclobenzaprine (FLEXERIL) 10 MG tablet, Take 10 mg by mouth 3 (three) times daily as needed for muscle spasms., Disp: , Rfl:  .  dexlansoprazole (DEXILANT) 60 MG capsule, Take 1 capsule (60 mg total) by mouth daily., Disp: 30 capsule, Rfl: 5 .  diclofenac (VOLTAREN) 75 MG EC tablet, Take 75 mg by mouth 2 (two) times daily as needed for mild pain. , Disp: , Rfl:  .  dicyclomine (BENTYL) 10 MG capsule, TAKE 1 CAPSULE BY MOUTH 4 TIMES DAILY BEFORE MEALS & AT BEDTIME AS NEEDED FOR LOOSE STOOLS & ABDOMINAL PAIN, Disp: 120 capsule, Rfl: 1 .  DULoxetine (CYMBALTA) 60 MG capsule, Take 60 mg by mouth 2 (two) times daily. Reported on 05/03/2016, Disp: , Rfl:  .  Elastic Bandages & Supports  (LUMBAR BACK BRACE/SUPPORT PAD) MISC, Wear as needed, Disp: 1 each, Rfl: 0 .  FLUoxetine (PROZAC) 40 MG capsule, Take 40 mg by mouth daily., Disp: , Rfl:  .  gabapentin (NEURONTIN) 300 MG capsule, Take 300 mg by mouth 3 (three) times daily as needed (for pain). , Disp: , Rfl:  .  ibuprofen (ADVIL,MOTRIN) 800 MG tablet, Take 1 tablet (800 mg total) by mouth 3 (three) times daily., Disp: 21 tablet, Rfl: 0 .  lamoTRIgine (LAMICTAL) 150 MG tablet, Take 300 mg by mouth daily. , Disp: , Rfl:  .  metFORMIN (GLUCOPHAGE) 500 MG tablet, Take 500 mg by mouth every evening., Disp: , Rfl:  .  metoprolol tartrate (LOPRESSOR) 25 MG tablet, Take 25 mg by mouth 2 (two) times daily., Disp: , Rfl:  .  metroNIDAZOLE (FLAGYL) 500 MG tablet, Take 1 tablet (500 mg total) by mouth 2 (two) times daily., Disp: 20 tablet, Rfl: 0 .  Multiple Vitamin (MULTIVITAMIN) tablet, Take 1 tablet by mouth daily., Disp: , Rfl:  .  OLANZapine (ZYPREXA) 10 MG tablet, Take 10 mg by mouth at bedtime. For mood swings and help sleep and 5-10mg  as needed for agitation/paranoia, Disp: , Rfl:  .  ondansetron (ZOFRAN) 4 MG tablet, TAKE ONE TABLET BY MOUTH EVERY 8 HOURS  AS NEEDED FOR NAUSEA OR VOMITING, Disp: 30 tablet, Rfl: 1 .  Potassium 99 MG TABS, Take 1 tablet by mouth every morning., Disp: , Rfl:  .  ranitidine (ZANTAC) 150 MG capsule, Take one daily before evening meal. (Patient taking differently: Take 150 mg by mouth every evening. ), Disp: 30 capsule, Rfl: 3 .  risperiDONE (RISPERDAL) 0.5 MG tablet, Take 0.5-1 mg by mouth at bedtime., Disp: , Rfl:  .  traMADol (ULTRAM) 50 MG tablet, Take 1 tablet (50 mg total) by mouth every 6 (six) hours as needed., Disp: 15 tablet, Rfl: 0 .  traZODone (DESYREL) 150 MG tablet, Take 300 mg by mouth at bedtime as needed for sleep. , Disp: , Rfl:  .  sildenafil (VIAGRA) 100 MG tablet, Take 0.5-1 tablets (50-100 mg total) by mouth daily as needed for erectile dysfunction. (Patient not taking: Reported on  08/12/2016), Disp: 5 tablet, Rfl: 3   Review of Systems  Constitutional: Positive for appetite change and fatigue. Negative for chills, diaphoresis, fever and unexpected weight change.  HENT: Negative for congestion, drooling, ear pain, facial swelling, hearing loss, mouth sores, sneezing, sore throat, trouble swallowing and voice change.   Eyes: Negative for pain, discharge, redness, itching and visual disturbance.  Respiratory: Positive for shortness of breath. Negative for cough, choking and wheezing.   Cardiovascular: Negative for chest pain, palpitations and leg swelling.  Gastrointestinal: Negative for abdominal pain, blood in stool, constipation, diarrhea and vomiting.  Endocrine: Positive for polydipsia. Negative for cold intolerance and heat intolerance.  Genitourinary: Negative for decreased urine volume, dysuria and hematuria.  Musculoskeletal: Positive for arthralgias, back pain and gait problem.  Skin: Negative for rash.  Allergic/Immunologic: Negative for environmental allergies.  Neurological: Negative for seizures, syncope, light-headedness and headaches.  Hematological: Negative for adenopathy.  Psychiatric/Behavioral: Positive for agitation and dysphoric mood. Negative for suicidal ideas. The patient is nervous/anxious.     Per HPI unless specifically indicated above     Objective:    BP 114/78 (BP Location: Left Arm, Patient Position: Sitting, Cuff Size: Normal)   Pulse (!) 109   Temp 97.9 F (36.6 C)   Ht 5\' 11"  (1.803 m)   Wt 194 lb 9.6 oz (88.3 kg)   SpO2 95%   BMI 27.14 kg/m   Wt Readings from Last 3 Encounters:  08/12/16 194 lb 9.6 oz (88.3 kg)  08/09/16 198 lb (89.8 kg)  07/01/16 197 lb 6.4 oz (89.5 kg)    Physical Exam  Constitutional: He is oriented to person, place, and time. He appears well-developed and well-nourished.  HENT:  Head: Normocephalic and atraumatic.  Neck: Neck supple.  Cardiovascular: Normal rate and regular rhythm.   Pulse 96   Pulmonary/Chest: Effort normal and breath sounds normal. He has no wheezes.  Abdominal: Soft. Bowel sounds are normal. There is no hepatosplenomegaly. There is no tenderness.  Musculoskeletal: He exhibits no edema.  Lymphadenopathy:    He has no cervical adenopathy.  Neurological: He is alert and oriented to person, place, and time.  Skin: Skin is warm and dry.  Psychiatric: He has a normal mood and affect. His behavior is normal.  Vitals reviewed.       Assessment & Plan:   Encounter Diagnosis  Name Primary?  . Diverticulitis of intestine without perforation or abscess without bleeding, unspecified part of intestinal tract Yes    -pt improving.  He is to finish the antibiotics that he was given in the ER.  Follow-up as scheduled.  RTO sooner  prn worsening or new symptoms

## 2016-08-19 DIAGNOSIS — Z87898 Personal history of other specified conditions: Secondary | ICD-10-CM | POA: Insufficient documentation

## 2016-08-19 DIAGNOSIS — R5381 Other malaise: Secondary | ICD-10-CM | POA: Insufficient documentation

## 2016-08-26 ENCOUNTER — Encounter: Payer: Self-pay | Admitting: Gastroenterology

## 2016-08-26 ENCOUNTER — Ambulatory Visit (INDEPENDENT_AMBULATORY_CARE_PROVIDER_SITE_OTHER): Payer: Medicaid Other | Admitting: Gastroenterology

## 2016-08-26 ENCOUNTER — Telehealth: Payer: Self-pay | Admitting: Internal Medicine

## 2016-08-26 VITALS — BP 118/76 | HR 95 | Temp 97.9°F | Ht 71.0 in | Wt 194.2 lb

## 2016-08-26 DIAGNOSIS — K5732 Diverticulitis of large intestine without perforation or abscess without bleeding: Secondary | ICD-10-CM

## 2016-08-26 MED ORDER — AMOXICILLIN-POT CLAVULANATE 875-125 MG PO TABS: 1.0000 | ORAL_TABLET | Freq: Two times a day (BID) | ORAL | 0 refills | Status: DC

## 2016-08-26 NOTE — Progress Notes (Signed)
Primary Care Physician:  Soyla Dryer, PA-C  Primary Gastroenterologist:  Garfield Cornea, MD   Chief Complaint  Patient presents with  . Abdominal Pain    HPI:  Shawn [name] is a 45 y.o. male here for further evaluation of abdominal pain. Recently seen in ER with LLQ pain. Empirically treated for diverticulitis with Cipro/Flagyl. First time patient has been treated for this. Symptoms improved on antibiotics but remains persistent. Pain is constant, less severe than initially. 2/10 but increased intensity at times. Initial symptoms began after consuming crunchy peanut butter sandwich. BM regular. No blood in stool. No n/v. Heartburn controlled. No fever, dysuria.      Current Outpatient Prescriptions  Medication Sig Dispense Refill  . cetirizine (ZYRTEC) 10 MG chewable tablet Chew 10 mg by mouth daily as needed for allergies.    . Cyanocobalamin (VITAMIN B-12) 5000 MCG TBDP Take 1 tablet by mouth daily.    . cyclobenzaprine (FLEXERIL) 10 MG tablet Take 10 mg by mouth 3 (three) times daily as needed for muscle spasms.    Marland Kitchen dexlansoprazole (DEXILANT) 60 MG capsule Take 1 capsule (60 mg total) by mouth daily. 30 capsule 5  . diclofenac (VOLTAREN) 75 MG EC tablet Take 75 mg by mouth 2 (two) times daily as needed for mild pain.     Marland Kitchen dicyclomine (BENTYL) 10 MG capsule TAKE 1 CAPSULE BY MOUTH 4 TIMES DAILY BEFORE MEALS & AT BEDTIME AS NEEDED FOR LOOSE STOOLS & ABDOMINAL PAIN 120 capsule 1  . DULoxetine (CYMBALTA) 60 MG capsule Take 60 mg by mouth 2 (two) times daily. Reported on 05/03/2016    . Elastic Bandages & Supports (LUMBAR BACK BRACE/SUPPORT PAD) MISC Wear as needed 1 each 0  . FLUoxetine (PROZAC) 40 MG capsule Take 40 mg by mouth daily.    Marland Kitchen gabapentin (NEURONTIN) 300 MG capsule Take 300 mg by mouth 3 (three) times daily as needed (for pain).     Marland Kitchen lamoTRIgine (LAMICTAL) 150 MG tablet Take 300 mg by mouth daily.     . metFORMIN (GLUCOPHAGE) 500 MG tablet Take 500 mg by mouth every  evening.    . metoprolol tartrate (LOPRESSOR) 25 MG tablet Take 25 mg by mouth 2 (two) times daily.    . Multiple Vitamin (MULTIVITAMIN) tablet Take 1 tablet by mouth daily.    Marland Kitchen OLANZapine (ZYPREXA) 10 MG tablet Take 10 mg by mouth at bedtime. For mood swings and help sleep and 5-10mg  as needed for agitation/paranoia    . ondansetron (ZOFRAN) 4 MG tablet TAKE ONE TABLET BY MOUTH EVERY 8 HOURS AS NEEDED FOR NAUSEA OR VOMITING 30 tablet 1  . Potassium 99 MG TABS Take 1 tablet by mouth every morning.    . ranitidine (ZANTAC) 150 MG capsule Take one daily before evening meal. (Patient taking differently: Take 150 mg by mouth every evening. ) 30 capsule 3  . risperiDONE (RISPERDAL) 0.5 MG tablet Take 0.5-1 mg by mouth at bedtime.    . traZODone (DESYREL) 150 MG tablet Take 300 mg by mouth at bedtime as needed for sleep.     .       . ibuprofen (ADVIL,MOTRIN) 800 MG tablet Take 1 tablet (800 mg total) by mouth 3 (three) times daily. (Patient not taking: Reported on 08/26/2016) 21 tablet 0   No current facility-administered medications for this visit.     Allergies as of 08/26/2016  . (No Known Allergies)    Past Medical History:  Diagnosis Date  . Anemia   .  Arthritis   . Back pain   . Bipolar 1 disorder ([place])   . Bipolar disorder ([place])   . 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 COPD (chronic obstructive pulmonary disease) ([place])   . Diverticulitis   . Family history of adverse reaction to anesthesia    he was adopted  . GERD (gastroesophageal reflux disease)   . Heart disease    "irregular heart beat"  . HOH (hard of hearing)    biological parents are both deaf.   left ear is good.  . Hyperlipidemia   . Hypertension   . 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 Lung nodules   . Schizoaffective disorder, bipolar type ([place])   . Vomiting     Past Surgical History:  Procedure Laterality Date  . BRAIN SURGERY  2005   after head injury, patient states had 2  large blood clots evacuated  . COLONOSCOPY WITH ESOPHAGOGASTRODUODENOSCOPY (EGD) N/A 12/05/2013   [id number] erosive relfux/HH/melanosis coli/colonic diverticulosis. tubulovillous adenoma removed. next tcs 11/2016  . FLEXIBLE BRONCHOSCOPY N/A 01/23/2016   Procedure: FLEXIBLE BRONCHOSCOPY;  Surgeon: Gaye Pollack, MD;  Location: [facility] OR;  Service: Thoracic;  Laterality: N/A;  . LUNG BIOPSY N/A 01/23/2016   Procedure: LEFT LUNG BIOPSY;  Surgeon: Gaye Pollack, MD;  Location: [facility] OR;  Service: Thoracic;  Laterality: N/A;  . VIDEO ASSISTED THORACOSCOPY Left 01/23/2016   Procedure: LEFT VIDEO ASSISTED THORACOSCOPY;  Surgeon: Gaye Pollack, MD;  Location: [facility];  Service: Thoracic;  Laterality: Left;    Family History  Problem Relation Age of Onset  . Adopted: Yes  . Alzheimer's disease Other   . Parkinson's disease Other   . Mental illness Other   . Colon cancer Neg Hx     adopted at 22 months old, unsure about GI history of parents     Social History   Social History  . Marital status: Divorced    Spouse name: N/A  . Number of children: 1  . Years of education: N/A   Occupational History  . disabled    Social History Main Topics  . Smoking status: Former Smoker    Packs/day: 0.75    Years: 25.00    Types: Cigars, Cigarettes    Quit date: 07/09/2014  . Smokeless tobacco: Current User    Types: Snuff     Comment: pt uses dip tobacco  . Alcohol use 0.0 oz/week     Comment: occ  . Drug use: No  . Sexual activity: Not on file   Other Topics Concern  . Not on file   Social History Narrative   He is originally from [facility] 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 Has always lived in [place]. Previously have traveled to [place], [place], [place], [place], [place]. Remote travel to [place]. 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 Lived in a house with mold around 2001-2002 as well as 2006. Has 2 dogs currently. No bird exposure. No hot tub exposure. Mainly walks for fun. He was  incarcerated for 2 years previously. No known TB exposure. Had previous negative PPDs last around 2010. Never lived in a homeless shelter but has eaten at them before.       ROS:  General: Negative for anorexia, weight loss, fever, chills, fatigue, weakness. Eyes: Negative for vision changes.  ENT: Negative for hoarseness, difficulty swallowing , nasal congestion. CV: Negative for chest pain, angina, palpitations,  dyspnea on exertion, peripheral edema.  Respiratory: Negative for dyspnea at rest, dyspnea on exertion, cough, sputum, wheezing.  GI: See history of present illness. GU:  Negative for dysuria, hematuria, urinary incontinence, urinary frequency, nocturnal urination.  MS: Negative for joint pain, low back pain.  Derm: Negative for rash or itching.  Neuro: Negative for weakness, abnormal sensation, seizure, frequent headaches, memory loss, confusion.  Psych: Negative for anxiety, depression, suicidal ideation, hallucinations.  Endo: Negative for unusual weight change.  Heme: Negative for bruising or bleeding. Allergy: Negative for rash or hives.    Physical Examination:  BP 118/76   Pulse 95   Temp 97.9 F (36.6 C) (Oral)   Ht 5\' 11"  (1.803 m)   Wt 194 lb 3.2 oz (88.1 kg)   BMI 27.09 kg/m    General: Well-nourished, well-developed in no acute distress.  Head: Normocephalic, atraumatic.   Eyes: Conjunctiva pink, no icterus. Mouth: Oropharyngeal mucosa moist and pink , no lesions erythema or exudate. Neck: Supple without thyromegaly, masses, or lymphadenopathy.  Lungs: Clear to auscultation bilaterally.  Heart: Regular rate and rhythm, no murmurs rubs or gallops.  Abdomen: Bowel sounds are normal, moderate LLQ tenderness , nondistended, no hepatosplenomegaly or masses, no abdominal bruits or    hernia , no rebound or guarding.   Rectal: not performed Extremities: No lower extremity edema. No clubbing or deformities.  Neuro: Alert and oriented x 4 , grossly normal  neurologically.  Skin: Warm and dry, no rash or jaundice.   Psych: Alert and cooperative, normal mood and affect.  Labs: Lab Results  Component Value Date   WBC 12.4 (H) 08/09/2016   HGB 15.3 08/09/2016   HCT 44.2 08/09/2016   MCV 94.2 08/09/2016   PLT 278 08/09/2016   Lab Results  Component Value Date   CREATININE 1.01 08/09/2016   BUN 11 08/09/2016   NA 138 08/09/2016   K 4.5 08/09/2016   CL 104 08/09/2016   CO2 28 08/09/2016   Lab Results  Component Value Date   ALT 16 (L) 08/09/2016   AST 20 08/09/2016   ALKPHOS 55 08/09/2016   BILITOT 0.6 08/09/2016    Imaging Studies: Dg Chest 2 View  Result Date: 08/06/2016 CLINICAL DATA:  Chest pain, dyspnea. EXAM: CHEST  2 VIEW COMPARISON:  Radiographs of April 25, 2016. FINDINGS: Stable cardiomediastinal silhouette. No pneumothorax or pleural effusion is noted. Stable left basilar scarring is noted. Right lung is unremarkable. Bony thorax is intact. IMPRESSION: Stable left basilar scarring. No significant change compared to prior exam. Electronically Signed   By: Marijo Conception, M.D.   On: 08/06/2016 20:46   Ct Angio Chest Pe W And/or Wo Contrast  Result Date: 08/06/2016 CLINICAL DATA:  45 y/o M; pleuritic right-sided chest pain. History of IgG 4 lung disease. EXAM: CT ANGIOGRAPHY CHEST WITH CONTRAST TECHNIQUE: Multidetector CT imaging of the chest was performed using the standard protocol during bolus administration of intravenous contrast. Multiplanar CT image reconstructions and MIPs were obtained to evaluate the vascular anatomy. CONTRAST:  100 cc Isovue 370 COMPARISON:  02/20/2016 CT chest. FINDINGS: Cardiovascular: Satisfactory opacification of the pulmonary arteries. Respiratory motion artifact at the lung bases. No central or lobar pulmonary embolus. No definite segmental pulmonary embolus. Normal heart size. No evidence for right heart strain. Normal caliber thoracic aorta and main pulmonary artery. No pericardial effusion.  Mediastinum/Nodes: No enlarged mediastinal, hilar, or axillary lymph nodes. Thyroid gland, trachea, and esophagus demonstrate no significant findings. Lungs/Pleura: There are pulmonary cysts within  the right middle and lower lobe. There are few small clusters of nodules within the right upper lobe along the major fissure. No consolidation. No pleural effusion. No pneumothorax. Upper Abdomen: No acute abnormality. Musculoskeletal: No chest wall abnormality. No acute or significant osseous findings. Review of the MIP images confirms the above findings. IMPRESSION: 1. Respiratory motion artifact at the lung bases. No pulmonary embolus is identified. 2. Mild cluster of nodules in the right upper lobe along the major fissure may represent a mild bronchitis/bronchiolitis. No consolidation or pleural effusion. Electronically Signed   By: Kristine Garbe M.D.   On: 08/06/2016 23:55

## 2016-08-26 NOTE — Telephone Encounter (Signed)
Pt called from [facility] saying his antibiotic prescription wasn't there. His prescription went to [facility]. Please forward prescription to [facility].

## 2016-08-26 NOTE — Patient Instructions (Signed)
1. Take Augmentin twice daily with food for 10 days.  2. Please cut back to soft diet while on antibiotics. 3. Call if you have persistent abdominal pain, at that time would consider CT scan of your abdomen to further evaluation.

## 2016-08-26 NOTE — Telephone Encounter (Signed)
Called rx to [facility]- left rx on pharmacy voicemail.

## 2016-08-26 NOTE — Assessment & Plan Note (Addendum)
Suspect persistent diverticulitis. Treat with augmentin for 10 days. If persistent symptoms after additional antibiotics, then would consider CT to rule out complicated diverticulitis. Cut back to soft diet next few days. Once off abx, gradually increase dietary fiber. He is due for next TCS in 11/2016.

## 2016-08-27 NOTE — Progress Notes (Signed)
CC'ED TO PCP 

## 2016-08-30 NOTE — Telephone Encounter (Signed)
FYI, patient specifically requested me to send it to [facility] at time of his OV. I had to add the pharmacy, and change from [place] when he requested this.

## 2016-09-09 ENCOUNTER — Other Ambulatory Visit: Payer: Self-pay | Admitting: Gastroenterology

## 2016-09-09 ENCOUNTER — Other Ambulatory Visit: Payer: Self-pay | Admitting: Nurse Practitioner

## 2016-09-09 DIAGNOSIS — R195 Other fecal abnormalities: Secondary | ICD-10-CM

## 2016-09-09 DIAGNOSIS — K2100 Gastro-esophageal reflux disease with esophagitis, without bleeding: Secondary | ICD-10-CM

## 2016-09-09 DIAGNOSIS — R112 Nausea with vomiting, unspecified: Secondary | ICD-10-CM

## 2016-09-27 ENCOUNTER — Encounter: Payer: Self-pay | Admitting: Physician Assistant

## 2016-09-27 ENCOUNTER — Ambulatory Visit: Payer: Medicaid Other | Admitting: Physician Assistant

## 2016-09-27 VITALS — BP 106/82 | HR 100 | Temp 98.1°F | Ht 71.0 in | Wt 201.5 lb

## 2016-09-27 DIAGNOSIS — K219 Gastro-esophageal reflux disease without esophagitis: Secondary | ICD-10-CM

## 2016-09-27 DIAGNOSIS — N529 Male erectile dysfunction, unspecified: Secondary | ICD-10-CM

## 2016-09-27 DIAGNOSIS — D8989 Other specified disorders involving the immune mechanism, not elsewhere classified: Secondary | ICD-10-CM

## 2016-09-27 DIAGNOSIS — F39 Unspecified mood [affective] disorder: Secondary | ICD-10-CM

## 2016-09-27 NOTE — Progress Notes (Signed)
BP 106/82 (BP Location: Left Arm, Patient Position: Sitting, Cuff Size: Large)   Pulse 100   Temp 98.1 F (36.7 C) (Other (Comment))   Ht 5\' 11"  (1.803 m)   Wt 201 lb 8 oz (91.4 kg)   SpO2 96%   BMI 28.10 kg/m    Subjective:    Patient ID: Shawn [name], male    DOB: August 08, 1971, 45 y.o.   MRN: [id number]  HPI: Shawn [name] is a 45 y.o. male presenting on 09/27/2016 for Follow-up   HPI   pt is still seeing pulmonogy at [facility] for 2nd opinion on lung problems. That was arranged by dr Ashok Cordia.   Pt has appointment with rheumatologist at [facility] in January.  He isn't sure who referred him there.   He is going to [facility] for [facility] issues (person there is prescriber of metformin- see note 07/01/2016)  He is going to [place] for diarrhea/gerd  Pt wanting "something to help with my hands"  Pt says he was prescribed viagra at 07/01/16 OV. He says he didn't get rx due to cost.    Relevant past medical, surgical, family and social history reviewed and updated as indicated. Interim medical history since our last visit reviewed. Allergies and medications reviewed and updated.   Current Outpatient Prescriptions:  .  cetirizine (ZYRTEC) 10 MG chewable tablet, Chew 10 mg by mouth daily as needed for allergies., Disp: , Rfl:  .  Cyanocobalamin (VITAMIN B-12) 5000 MCG TBDP, Take 1 tablet by mouth daily., Disp: , Rfl:  .  cyclobenzaprine (FLEXERIL) 10 MG tablet, Take 10 mg by mouth 3 (three) times daily as needed for muscle spasms., Disp: , Rfl:  .  dexlansoprazole (DEXILANT) 60 MG capsule, Take 1 capsule (60 mg total) by mouth daily., Disp: 30 capsule, Rfl: 5 .  diclofenac (VOLTAREN) 75 MG EC tablet, Take 75 mg by mouth 2 (two) times daily as needed for mild pain. , Disp: , Rfl:  .  dicyclomine (BENTYL) 10 MG capsule, TAKE 1 CAPSULE BY MOUTH 4 TIMES DAILY BEFORE MEALS & AT BEDTIME AS NEEDED FOR LOOSE STOOLS & ABDOMINAL PAIN, Disp: 120 capsule, Rfl: 0 .  DULoxetine (CYMBALTA) 60 MG  capsule, Take 60 mg by mouth 2 (two) times daily. Reported on 05/03/2016, Disp: , Rfl:  .  Elastic Bandages & Supports (LUMBAR BACK BRACE/SUPPORT PAD) MISC, Wear as needed, Disp: 1 each, Rfl: 0 .  FLUoxetine (PROZAC) 40 MG capsule, Take 40 mg by mouth daily., Disp: , Rfl:  .  gabapentin (NEURONTIN) 300 MG capsule, Take 300 mg by mouth 3 (three) times daily as needed (for pain). , Disp: , Rfl:  .  lamoTRIgine (LAMICTAL) 150 MG tablet, Take 300 mg by mouth daily. , Disp: , Rfl:  .  metFORMIN (GLUCOPHAGE) 500 MG tablet, Take 500 mg by mouth every evening., Disp: , Rfl:  .  metoprolol tartrate (LOPRESSOR) 25 MG tablet, Take 25 mg by mouth 2 (two) times daily., Disp: , Rfl:  .  Multiple Vitamin (MULTIVITAMIN) tablet, Take 1 tablet by mouth daily., Disp: , Rfl:  .  OLANZapine (ZYPREXA) 10 MG tablet, Take 10 mg by mouth at bedtime. For mood swings and help sleep and 5-10mg  as needed for agitation/paranoia, Disp: , Rfl:  .  ondansetron (ZOFRAN) 4 MG tablet, TAKE ONE TABLET BY MOUTH EVERY 8 HOURS AS NEEDED FOR NAUSEA OR VOMITING, Disp: 30 tablet, Rfl: 1 .  Potassium 99 MG TABS, Take 1 tablet by mouth every morning., Disp: , Rfl:  .  ranitidine (ZANTAC) 150 MG capsule, Take one daily before evening meal., Disp: 30 capsule, Rfl: 3 .  risperiDONE (RISPERDAL) 0.5 MG tablet, Take 0.5-1 mg by mouth at bedtime., Disp: , Rfl:  .  traZODone (DESYREL) 150 MG tablet, Take 300 mg by mouth at bedtime as needed for sleep. , Disp: , Rfl:    Review of Systems  Constitutional: Positive for fatigue. Negative for appetite change, chills, diaphoresis, fever and unexpected weight change.  HENT: Positive for hearing loss. Negative for congestion, drooling, ear pain, facial swelling, mouth sores, sneezing, sore throat, trouble swallowing and voice change.   Eyes: Negative for pain, discharge, redness, itching and visual disturbance.  Respiratory: Positive for cough and shortness of breath. Negative for choking and wheezing.    Cardiovascular: Negative for chest pain, palpitations and leg swelling.  Gastrointestinal: Positive for vomiting. Negative for abdominal pain, blood in stool, constipation and diarrhea.  Endocrine: Negative for cold intolerance, heat intolerance and polydipsia.  Genitourinary: Negative for decreased urine volume, dysuria and hematuria.  Musculoskeletal: Positive for arthralgias and back pain. Negative for gait problem.  Skin: Negative for rash.  Allergic/Immunologic: Negative for environmental allergies.  Neurological: Negative for seizures, syncope, light-headedness and headaches.  Hematological: Negative for adenopathy.  Psychiatric/Behavioral: Positive for agitation and dysphoric mood. Negative for suicidal ideas. The patient is nervous/anxious.     Per HPI unless specifically indicated above     Objective:    BP 106/82 (BP Location: Left Arm, Patient Position: Sitting, Cuff Size: Large)   Pulse 100   Temp 98.1 F (36.7 C) (Other (Comment))   Ht 5\' 11"  (1.803 m)   Wt 201 lb 8 oz (91.4 kg)   SpO2 96%   BMI 28.10 kg/m   Wt Readings from Last 3 Encounters:  09/27/16 201 lb 8 oz (91.4 kg)  08/26/16 194 lb 3.2 oz (88.1 kg)  08/12/16 194 lb 9.6 oz (88.3 kg)    Physical Exam  Constitutional: He is oriented to person, place, and time. He appears well-developed and well-nourished.  HENT:  Head: Normocephalic and atraumatic.  Neck: Neck supple.  Cardiovascular: Normal rate and regular rhythm.   Pulmonary/Chest: Effort normal and breath sounds normal. He has no wheezes.  Abdominal: Soft. Bowel sounds are normal. There is no hepatosplenomegaly. There is no tenderness.  Musculoskeletal: He exhibits no edema.       Right hand: Normal. He exhibits no deformity and no swelling.       Left hand: Normal. He exhibits no deformity and no swelling.  Lymphadenopathy:    He has no cervical adenopathy.  Neurological: He is alert and oriented to person, place, and time.  Skin: Skin is warm  and dry.  Psychiatric: He has a normal mood and affect. His behavior is normal.  Vitals reviewed.       Assessment & Plan:   Encounter Diagnoses  Name Primary?  . Erectile dysfunction, unspecified erectile dysfunction type Yes  . Gastroesophageal reflux disease, esophagitis presence not specified   . Mood disorder ([place])   . 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  Discussed with pt and recommended he contact his pharmacy to see what would be best price for him.   -pt to continue with specialists as scheduled. -follow up here in 3 months.  RTO sooner  prn

## 2016-10-07 ENCOUNTER — Other Ambulatory Visit: Payer: Self-pay | Admitting: Nurse Practitioner

## 2016-10-07 DIAGNOSIS — K2100 Gastro-esophageal reflux disease with esophagitis, without bleeding: Secondary | ICD-10-CM

## 2016-10-07 DIAGNOSIS — R112 Nausea with vomiting, unspecified: Secondary | ICD-10-CM

## 2016-10-07 DIAGNOSIS — R195 Other fecal abnormalities: Secondary | ICD-10-CM

## 2016-10-19 ENCOUNTER — Encounter: Payer: Self-pay | Admitting: Internal Medicine

## 2016-11-02 ENCOUNTER — Emergency Department (HOSPITAL_COMMUNITY)
Admission: EM | Admit: 2016-11-02 | Discharge: 2016-11-02 | Disposition: A | Discharge status: Home / Self Care | Payer: Medicaid Other | Attending: Emergency Medicine | Admitting: Emergency Medicine

## 2016-11-02 ENCOUNTER — Emergency Department (HOSPITAL_COMMUNITY): Payer: Medicaid Other

## 2016-11-02 ENCOUNTER — Encounter (HOSPITAL_COMMUNITY): Payer: Self-pay

## 2016-11-02 DIAGNOSIS — F17290 Nicotine dependence, other tobacco product, uncomplicated: Secondary | ICD-10-CM | POA: Diagnosis not present

## 2016-11-02 DIAGNOSIS — Z7984 Long term (current) use of oral hypoglycemic drugs: Secondary | ICD-10-CM | POA: Insufficient documentation

## 2016-11-02 DIAGNOSIS — J449 Chronic obstructive pulmonary disease, unspecified: Secondary | ICD-10-CM | POA: Diagnosis not present

## 2016-11-02 DIAGNOSIS — R1030 Lower abdominal pain, unspecified: Secondary | ICD-10-CM | POA: Diagnosis present

## 2016-11-02 DIAGNOSIS — Z79899 Other long term (current) drug therapy: Secondary | ICD-10-CM | POA: Diagnosis not present

## 2016-11-02 DIAGNOSIS — K5792 Diverticulitis of intestine, part unspecified, without perforation or abscess without bleeding: Secondary | ICD-10-CM | POA: Insufficient documentation

## 2016-11-02 DIAGNOSIS — I10 Essential (primary) hypertension: Secondary | ICD-10-CM | POA: Insufficient documentation

## 2016-11-02 LAB — URINALYSIS, ROUTINE W REFLEX MICROSCOPIC
Bilirubin Urine: NEGATIVE
GLUCOSE, UA: NEGATIVE mg/dL
Hgb urine dipstick: NEGATIVE
Ketones, ur: NEGATIVE mg/dL
LEUKOCYTES UA: NEGATIVE
Nitrite: NEGATIVE
PROTEIN: NEGATIVE mg/dL
Specific Gravity, Urine: 1.001 — ABNORMAL LOW (ref 1.005–1.030)
pH: 8 (ref 5.0–8.0)

## 2016-11-02 LAB — CBC
HEMATOCRIT: 46.9 % (ref 39.0–52.0)
Hemoglobin: 16 g/dL (ref 13.0–17.0)
MCH: 31.9 pg (ref 26.0–34.0)
MCHC: 34.1 g/dL (ref 30.0–36.0)
MCV: 93.4 fL (ref 78.0–100.0)
PLATELETS: 319 10*3/uL (ref 150–400)
RBC: 5.02 MIL/uL (ref 4.22–5.81)
RDW: 12.9 % (ref 11.5–15.5)
WBC: 13.2 10*3/uL — AB (ref 4.0–10.5)

## 2016-11-02 LAB — COMPREHENSIVE METABOLIC PANEL
ALT: 21 U/L (ref 17–63)
ANION GAP: 9 (ref 5–15)
AST: 19 U/L (ref 15–41)
Albumin: 4.4 g/dL (ref 3.5–5.0)
Alkaline Phosphatase: 59 U/L (ref 38–126)
BUN: 10 mg/dL (ref 6–20)
CHLORIDE: 98 mmol/L — AB (ref 101–111)
CO2: 27 mmol/L (ref 22–32)
CREATININE: 0.99 mg/dL (ref 0.61–1.24)
Calcium: 9.7 mg/dL (ref 8.9–10.3)
Glucose, Bld: 109 mg/dL — ABNORMAL HIGH (ref 65–99)
POTASSIUM: 4 mmol/L (ref 3.5–5.1)
Sodium: 134 mmol/L — ABNORMAL LOW (ref 135–145)
Total Bilirubin: 0.8 mg/dL (ref 0.3–1.2)
Total Protein: 8.5 g/dL — ABNORMAL HIGH (ref 6.5–8.1)

## 2016-11-02 LAB — LIPASE, BLOOD: LIPASE: 25 U/L (ref 11–51)

## 2016-11-02 LAB — POC OCCULT BLOOD, ED: FECAL OCCULT BLD: NEGATIVE

## 2016-11-02 MED ORDER — METRONIDAZOLE 500 MG PO TABS: 500.0000 mg | ORAL_TABLET | Freq: Two times a day (BID) | ORAL | 0 refills | Status: DC

## 2016-11-02 MED ORDER — IOPAMIDOL (ISOVUE-300) INJECTION 61%
100.0000 mL | Freq: Once | INTRAVENOUS | Status: AC | PRN
  Administered 2016-11-02: 100 mL via INTRAVENOUS

## 2016-11-02 MED ORDER — IOPAMIDOL (ISOVUE-300) INJECTION 61%
INTRAVENOUS | Status: AC
  Filled 2016-11-02: qty 30

## 2016-11-02 MED ORDER — ONDANSETRON HCL 4 MG PO TABS
4.0000 mg | ORAL_TABLET | Freq: Once | ORAL | Status: AC
  Administered 2016-11-02: 4 mg via ORAL
  Filled 2016-11-02: qty 1

## 2016-11-02 MED ORDER — AMOXICILLIN-POT CLAVULANATE 875-125 MG PO TABS
1.0000 | ORAL_TABLET | Freq: Once | ORAL | Status: AC
  Administered 2016-11-02: 1 via ORAL
  Filled 2016-11-02: qty 1

## 2016-11-02 MED ORDER — LEVOFLOXACIN 750 MG PO TABS: 750.0000 mg | ORAL_TABLET | Freq: Every day | ORAL | 0 refills | Status: DC

## 2016-11-02 MED ORDER — HYDROCODONE-ACETAMINOPHEN 5-325 MG PO TABS: 1.0000 | ORAL_TABLET | ORAL | 0 refills | Status: DC | PRN

## 2016-11-02 MED ORDER — MORPHINE SULFATE (PF) 4 MG/ML IV SOLN
4.0000 mg | Freq: Once | INTRAVENOUS | Status: AC
  Administered 2016-11-02: 4 mg via INTRAVENOUS
  Filled 2016-11-02: qty 1

## 2016-11-02 NOTE — Discharge Instructions (Signed)
Your examination suggest diverticulitis. There is an area of thickening of your colon that raise some question of diverticulitis versus possible cancerous lesion. Please see Dr. Buford Dresser as soon as possible for evaluation of your colon. Please use Levaquin and Flagyl daily with a meal. Use Norco for pain if needed. This medication may cause drowsiness, and/or constipation. Please use a stool softener while taking this medication.

## 2016-11-02 NOTE — ED Provider Notes (Signed)
[facility] DEPT Provider Note   CSN: [id number] Arrival date & time: 11/02/16  1037     History   Chief Complaint Chief Complaint  Patient presents with  . Abdominal Pain    HPI Shawn [name] is a 45 y.o. male.  Patient is a 45 year old male who presents to the emergency department with complaint of lower abdomen pain.  The patient states that this problem is been going on for about 2 weeks. He states that he came in because the pain was much sharper today than usual. He has a history of vomiting episodes, and has had one or 2 episodes of vomiting recently. He states that up to this point his doctors have not been a wet to identify the source of his vomiting. He has a history of diverticulitis, and he states he thinks that this pain feels similar to the previous diverticulitis pain. No c/o chest pain. He is seen by Dr. Buford Dresser for his GI issues. He was last studied with endoscopy and colonoscopy approximately 3 years ago according to the patient. He has not noticed any blood in the stool he's not he had chills, but no measured temperature elevation. His been no recent injury to the abdomen, and there's been no recent procedures present. Patient presents at this time for additional evaluation for assistance with his pain.      Past Medical History:  Diagnosis Date  . Anemia   . Arthritis   . Back pain   . Bipolar 1 disorder ([place])   . Bipolar disorder ([place])   . 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 COPD (chronic obstructive pulmonary disease) ([place])   . Diverticulitis   . Family history of adverse reaction to anesthesia    he was adopted  . GERD (gastroesophageal reflux disease)   . Heart disease    "irregular heart beat"  . HOH (hard of hearing)    biological parents are both deaf.   left ear is good.  . Hyperlipidemia   . Hypertension   . 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 Lung nodules   . Schizoaffective disorder, bipolar type ([place])     . Vomiting     Patient Active Problem List   Diagnosis Date Noted  . Hypotension 04/23/2016  . Fever 04/23/2016  . Cough 04/23/2016  . Tick bites 04/23/2016  . Hypokalemia 04/23/2016  . 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 Multiple lung nodules on CT 01/23/2016  . 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 Schizoaffective disorder ([place]) 12/22/2015  . Environmental allergies 12/22/2015  . Arrhythmia 12/22/2015  . Hyperlipidemia 12/22/2015  . Multiple lung nodules   . Chronic obstructive pulmonary disease ([place]) 11/17/2015  . Abdominal pain, periumbilical [id number]  . Nausea with vomiting 07/03/2015  . Abnormal chest CT 07/03/2015  . Loose stools 10/30/2013  . Diverticulitis of colon (without mention of hemorrhage)(562.11) 10/01/2013  . GERD (gastroesophageal reflux disease) 10/01/2013    Past Surgical History:  Procedure Laterality Date  . BRAIN SURGERY  2005   after head injury, patient states had 2 large blood clots evacuated  . COLONOSCOPY WITH ESOPHAGOGASTRODUODENOSCOPY (EGD) N/A 12/05/2013   [id number] erosive relfux/HH/melanosis coli/colonic diverticulosis. tubulovillous adenoma removed. next tcs 11/2016  . FLEXIBLE BRONCHOSCOPY N/A 01/23/2016   Procedure: FLEXIBLE BRONCHOSCOPY;  Surgeon: Gaye Pollack, MD;  Location: [facility] OR;  Service: Thoracic;  Laterality: N/A;  . LUNG BIOPSY N/A 01/23/2016   Procedure: LEFT LUNG BIOPSY;  Surgeon: Gaye Pollack, MD;  Location: [facility] OR;  Service: Thoracic;  Laterality: N/A;  . VIDEO ASSISTED THORACOSCOPY Left 01/23/2016   Procedure: LEFT VIDEO ASSISTED THORACOSCOPY;  Surgeon: Gaye Pollack, MD;  Location: [facility];  Service: Thoracic;  Laterality: Left;       Home Medications    Prior to Admission medications   Medication Sig Start Date End Date Taking? Authorizing Provider  cetirizine (ZYRTEC) 10 MG chewable tablet Chew 10 mg by mouth daily as needed for allergies.   Yes Historical Provider, MD  Cyanocobalamin (VITAMIN B-12)  5000 MCG TBDP Take 1 tablet by mouth daily.   Yes Historical Provider, MD  cyclobenzaprine (FLEXERIL) 10 MG tablet Take 10 mg by mouth 3 (three) times daily as needed for muscle spasms.   Yes Historical Provider, MD  dexlansoprazole (DEXILANT) 60 MG capsule Take 1 capsule (60 mg total) by mouth daily. 07/07/16  Yes Carlis Stable, NP  diclofenac (VOLTAREN) 75 MG EC tablet Take 75 mg by mouth 2 (two) times daily as needed for mild pain.    Yes Historical Provider, MD  dicyclomine (BENTYL) 10 MG capsule TAKE 1 CAPSULE BY MOUTH 4 TIMES DAILY BEFORE MEALS & AT BEDTIME AS NEEDED FOR LOOSE STOOLS & ABDOMINAL PAIN 10/08/16  Yes Annitta Needs, NP  DULoxetine (CYMBALTA) 60 MG capsule Take 60 mg by mouth 2 (two) times daily. Reported on 05/03/2016   Yes Historical Provider, MD  Elastic Bandages & Supports (LUMBAR BACK BRACE/SUPPORT PAD) MISC Wear as needed 07/01/16  Yes Soyla Dryer, PA-C  FLUoxetine (PROZAC) 40 MG capsule Take 40 mg by mouth daily.   Yes Historical Provider, MD  gabapentin (NEURONTIN) 300 MG capsule Take 300 mg by mouth 3 (three) times daily as needed (for pain).    Yes Historical Provider, MD  lamoTRIgine (LAMICTAL) 150 MG tablet Take 300 mg by mouth daily.    Yes Historical Provider, MD  metFORMIN (GLUCOPHAGE) 500 MG tablet Take 500 mg by mouth every evening.   Yes Historical Provider, MD  metoprolol tartrate (LOPRESSOR) 25 MG tablet Take 25 mg by mouth 2 (two) times daily.   Yes Historical Provider, MD  Multiple Vitamin (MULTIVITAMIN) tablet Take 1 tablet by mouth daily.   Yes Historical Provider, MD  OLANZapine (ZYPREXA) 10 MG tablet Take 10 mg by mouth at bedtime. For mood swings and help sleep and 5-10mg  as needed for agitation/paranoia   Yes Historical Provider, MD  ondansetron (ZOFRAN) 4 MG tablet TAKE ONE TABLET BY MOUTH EVERY 8 HOURS AS NEEDED FOR NAUSEA OR VOMITING 04/20/16  Yes Carlis Stable, NP  Potassium 99 MG TABS Take 1 tablet by mouth every morning.   Yes Historical Provider, MD    ranitidine (ZANTAC) 150 MG capsule Take one daily before evening meal. 06/22/16  Yes Mahala Menghini, PA-C  risperiDONE (RISPERDAL) 0.5 MG tablet Take 0.5-1 mg by mouth at bedtime.   Yes Historical Provider, MD  traZODone (DESYREL) 150 MG tablet Take 300 mg by mouth at bedtime as needed for sleep.    Yes Historical Provider, MD    Family History Family History  Problem Relation Age of Onset  . Adopted: Yes  . Alzheimer's disease Other   . Parkinson's disease Other   . Mental illness Other   . Colon cancer Neg Hx     adopted at 63 months old, unsure about GI history of parents     Social History Social History  Substance Use Topics  . Smoking status:  Former Smoker    Packs/day: 0.75    Years: 25.00    Types: Cigars, Cigarettes    Quit date: 07/09/2014  . Smokeless tobacco: Current User    Types: Snuff     Comment: pt uses dip tobacco  . Alcohol use 0.0 oz/week     Comment: occ     Allergies   Patient has no known allergies.   Review of Systems Review of Systems  Constitutional: Negative for activity change.       All ROS Neg except as noted in HPI  HENT: Negative for nosebleeds.   Eyes: Negative for photophobia and discharge.  Respiratory: Negative for cough, shortness of breath and wheezing.   Cardiovascular: Negative for chest pain and palpitations.  Gastrointestinal: Positive for abdominal distention, abdominal pain and vomiting. Negative for blood in stool, constipation and diarrhea.  Genitourinary: Negative for dysuria, frequency and hematuria.  Musculoskeletal: Negative for arthralgias, back pain and neck pain.  Skin: Negative.   Neurological: Negative for dizziness, seizures and speech difficulty.  Psychiatric/Behavioral: Negative for confusion and hallucinations.     Physical Exam Updated Vital Signs BP 117/99 (BP Location: Left Arm)   Pulse 95   Temp 98.4 F (36.9 C) (Oral)   Resp 16   Ht 5\' 11"  (1.803 m)   Wt 88.5 kg   SpO2 98%   BMI 27.20 kg/m    Physical Exam  Constitutional: He is oriented to person, place, and time. He appears well-developed and well-nourished.  Non-toxic appearance.  HENT:  Head: Normocephalic.  Right Ear: Tympanic membrane and external ear normal.  Left Ear: Tympanic membrane and external ear normal.  Eyes: EOM and lids are normal. Pupils are equal, round, and reactive to light.  Neck: Normal range of motion. Neck supple. Carotid bruit is not present.  Cardiovascular: Normal rate, regular rhythm, normal heart sounds, intact distal pulses and normal pulses.   Pulmonary/Chest: Breath sounds normal. No respiratory distress.  Abdominal: Soft. Bowel sounds are normal. There is no tenderness. There is no guarding.  Mild to moderate diffuse soreness on of the lower abdomen. No mass appreciated. No pulsating mass appreciated. No CVA tenderness noted.  Genitourinary:  Genitourinary Comments: Male family member in the room during the examination.  Patient has a skin tag at the anal area from previous hemorrhoid. No mass about the speech or of the anus .   Musculoskeletal: Normal range of motion.  Lymphadenopathy:       Head (right side): No submandibular adenopathy present.       Head (left side): No submandibular adenopathy present.    He has no cervical adenopathy.  Neurological: He is alert and oriented to person, place, and time. He has normal strength. No cranial nerve deficit or sensory deficit.  Skin: Skin is warm and dry.  Psychiatric: He has a normal mood and affect. His speech is normal.  Nursing note and vitals reviewed.    ED Treatments / Results  Labs (all labs ordered are listed, but only abnormal results are displayed) Labs Reviewed  COMPREHENSIVE METABOLIC PANEL - Abnormal; Notable for the following:       Result Value   Sodium 134 (*)    Chloride 98 (*)    Glucose, Bld 109 (*)    Total Protein 8.5 (*)    All other components within normal limits  CBC - Abnormal; Notable for the  following:    WBC 13.2 (*)    All other components within normal limits  URINALYSIS,  ROUTINE W REFLEX MICROSCOPIC - Abnormal; Notable for the following:    Specific Gravity, Urine 1.001 (*)    All other components within normal limits  LIPASE, BLOOD    EKG  EKG Interpretation None       Radiology No results found.  Procedures Procedures (including critical care time)  Medications Ordered in ED Medications - No data to display   Initial Impression / Assessment and Plan / ED Course  I have reviewed the triage vital signs and the nursing notes.  Pertinent labs & imaging results that were available during my care of the patient were reviewed by me and considered in my medical decision making (see chart for details).  Clinical Course     **I have reviewed nursing notes, vital signs, and all appropriate lab and imaging results for this patient.*  Final Clinical Impressions(s) / ED Diagnoses  Stool for cold blood is negative. Lipase is normal at 25. Compressive metabolic panel is normal. Complete blood count shows a slight elevation of the white blood cells 13,200, there is no shift to the left. Urinalysis is within normal limits. The CT of the abdomen shows descending and sigmoid colon diverticulosis with mild active diverticulitis of the sigmoid colon. There were some other suspicious findings that question whether or not a sigmoid colon malignancy could be present. This also noted some gastroesophageal reflux.  Instructed the patient to see Dr. Gala Romney as soon as possible for reevaluation of the diverticulitis as well as the suspicious areas of the colon.    Final diagnoses:  None    New Prescriptions Discharge Medication List as of 11/02/2016  2:53 PM    START taking these medications   Details  HYDROcodone-acetaminophen (NORCO/VICODIN) 5-325 MG tablet Take 1 tablet by mouth every 4 (four) hours as needed., Starting Tue 11/02/2016, Print    levofloxacin (LEVAQUIN) 750  MG tablet Take 1 tablet (750 mg total) by mouth daily., Starting Tue 11/02/2016, Print    metroNIDAZOLE (FLAGYL) 500 MG tablet Take 1 tablet (500 mg total) by mouth 2 (two) times daily., Starting Tue 11/02/2016, Print         Lily Kocher, PA-C 11/04/16 [place], MD 11/05/16 1455

## 2016-11-02 NOTE — ED Triage Notes (Signed)
Pt c/o pressure in lower abd for the past 2 weeks.  Reports has vomited a couple of times.  Denies diarrhea. LBM was this morning and was normal.

## 2016-11-04 ENCOUNTER — Other Ambulatory Visit: Payer: Self-pay | Admitting: Nurse Practitioner

## 2016-11-17 ENCOUNTER — Other Ambulatory Visit: Payer: Self-pay

## 2016-11-17 ENCOUNTER — Encounter: Payer: Self-pay | Admitting: Gastroenterology

## 2016-11-17 ENCOUNTER — Ambulatory Visit (INDEPENDENT_AMBULATORY_CARE_PROVIDER_SITE_OTHER): Payer: Medicaid Other | Admitting: Gastroenterology

## 2016-11-17 ENCOUNTER — Telehealth: Payer: Self-pay

## 2016-11-17 VITALS — BP 126/77 | HR 83 | Temp 98.4°F | Ht 71.0 in | Wt 196.4 lb

## 2016-11-17 DIAGNOSIS — K5732 Diverticulitis of large intestine without perforation or abscess without bleeding: Secondary | ICD-10-CM

## 2016-11-17 DIAGNOSIS — Z860101 Personal history of adenomatous and serrated colon polyps: Secondary | ICD-10-CM | POA: Insufficient documentation

## 2016-11-17 DIAGNOSIS — R933 Abnormal findings on diagnostic imaging of other parts of digestive tract: Secondary | ICD-10-CM | POA: Diagnosis not present

## 2016-11-17 DIAGNOSIS — Z860100 Personal history of colon polyps, unspecified: Secondary | ICD-10-CM

## 2016-11-17 DIAGNOSIS — K5792 Diverticulitis of intestine, part unspecified, without perforation or abscess without bleeding: Secondary | ICD-10-CM

## 2016-11-17 MED ORDER — SOD PICOSULFATE-MAG OX-CIT ACD 10-3.5-12 MG-GM-GM PO PACK: 1.0000 | PACK | ORAL | 0 refills | Status: DC

## 2016-11-17 NOTE — Progress Notes (Signed)
CC'ED TO PCP 

## 2016-11-17 NOTE — Progress Notes (Signed)
Primary Care Physician:  Jani Gravel, MD  Primary Gastroenterologist:  Garfield Cornea, MD   Chief Complaint  Patient presents with  . Diverticulitis    HPI:  Shawn [name] is a 46 y.o. male here to schedule surveillance colonoscopy. 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 Patient was last seen in the office in October 2017. At that time he had recently been treated for probable diverticulitis with Cipro and Flagyl with some improvement of his symptoms. Patient was provided additional antibiotic therapy in the way of Augmentin for 10 days. He states his symptoms completely resolved. However in December he presented back to the ED with complaints of abdominal pain, this time he had CT imaging with wall thickening in the sigmoid colon, abnormal inflammatory stranding in the sigmoid mesocolon compatible with acute diverticulitis. Left external iliac node 1.2 cm similar to findings in 2014.  Patient has completed Cipro and Flagyl provided on December 26. Took one week of therapy. Abdominal pain completely resolved. Bowel function normal. No blood in the stool or melena. Appetite is good. No vomiting or heartburn. Heartburn controlled current regimen. No dysphagia.  Current Outpatient Prescriptions  Medication Sig Dispense Refill  . cetirizine (ZYRTEC) 10 MG chewable tablet Chew 10 mg by mouth daily as needed for allergies.    . Cyanocobalamin (VITAMIN B-12) 5000 MCG TBDP Take 1 tablet by mouth daily.    . cyclobenzaprine (FLEXERIL) 10 MG tablet Take 10 mg by mouth 3 (three) times daily as needed for muscle spasms.    Marland Kitchen dexlansoprazole (DEXILANT) 60 MG capsule Take 1 capsule (60 mg total) by mouth daily. 30 capsule 5  . diclofenac (VOLTAREN) 75 MG EC tablet Take 75 mg by mouth 2 (two) times daily as needed for mild pain.     Marland Kitchen dicyclomine (BENTYL) 10 MG capsule TAKE 1 CAPSULE BY MOUTH 4 TIMES DAILY BEFORE MEALS & AT BEDTIME AS NEEDED FOR LOOSE STOOLS & ABDOMINAL  PAIN 120 capsule 3  . DULoxetine (CYMBALTA) 60 MG capsule Take 60 mg by mouth 2 (two) times daily. Reported on 05/03/2016    . Elastic Bandages & Supports (LUMBAR BACK BRACE/SUPPORT PAD) MISC Wear as needed 1 each 0  . FLUoxetine (PROZAC) 40 MG capsule Take 40 mg by mouth daily.    Marland Kitchen gabapentin (NEURONTIN) 300 MG capsule Take 300 mg by mouth 3 (three) times daily as needed (for pain).     Marland Kitchen lamoTRIgine (LAMICTAL) 150 MG tablet Take 300 mg by mouth daily.     Marland Kitchen lovastatin (MEVACOR) 20 MG tablet Take by mouth.    . metFORMIN (GLUCOPHAGE) 500 MG tablet Take 500 mg by mouth every evening.    . metoprolol tartrate (LOPRESSOR) 25 MG tablet Take 25 mg by mouth 2 (two) times daily.    . Multiple Vitamin (MULTIVITAMIN) tablet Take 1 tablet by mouth daily.    Marland Kitchen OLANZapine (ZYPREXA) 10 MG tablet Take 10 mg by mouth at bedtime. For mood swings and help sleep and 5-10mg  as needed for agitation/paranoia    . ondansetron (ZOFRAN) 4 MG tablet TAKE ONE TABLET BY MOUTH EVERY 8 HOURS AS NEEDED FOR NAUSEA OR VOMITING 30 tablet 1  . Potassium 99 MG TABS Take 1 tablet by mouth every morning.    . ranitidine (ZANTAC) 150 MG capsule Take one daily before evening meal. 30 capsule 3  . ranitidine (ZANTAC) 150 MG tablet TAKE 1 TABLET BY MOUTH ONCE DAILY BEFORE EVENING MEAL 30 tablet 5  .  risperiDONE (RISPERDAL) 0.5 MG tablet Take 0.5-1 mg by mouth at bedtime.    . traZODone (DESYREL) 150 MG tablet Take 300 mg by mouth at bedtime as needed for sleep.      No current facility-administered medications for this visit.     Allergies as of 11/17/2016  . (No Known Allergies)    Past Medical History:  Diagnosis Date  . Anemia   . Arthritis   . Back pain   . Bipolar 1 disorder ([place])   . Bipolar disorder ([place])   . 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 COPD (chronic obstructive pulmonary disease) ([place])   . Diverticulitis   . Family history of adverse reaction to anesthesia    he  was adopted  . GERD (gastroesophageal reflux disease)   . Heart disease    "irregular heart beat"  . HOH (hard of hearing)    biological parents are both deaf.   left ear is good.  . Hyperlipidemia   . Hypertension   . 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 Lung nodules   . Schizoaffective disorder, bipolar type ([place])   . Vomiting     Past Surgical History:  Procedure Laterality Date  . BRAIN SURGERY  2005   after head injury, patient states had 2 large blood clots evacuated  . COLONOSCOPY WITH ESOPHAGOGASTRODUODENOSCOPY (EGD) N/A 12/05/2013   [id number] erosive relfux/HH/melanosis coli/colonic diverticulosis. tubulovillous adenoma removed. next tcs 11/2016  . FLEXIBLE BRONCHOSCOPY N/A 01/23/2016   Procedure: FLEXIBLE BRONCHOSCOPY;  Surgeon: Gaye Pollack, MD;  Location: [facility] OR;  Service: Thoracic;  Laterality: N/A;  . LUNG BIOPSY N/A 01/23/2016   Procedure: LEFT LUNG BIOPSY;  Surgeon: Gaye Pollack, MD;  Location: [facility] OR;  Service: Thoracic;  Laterality: N/A;  . VIDEO ASSISTED THORACOSCOPY Left 01/23/2016   Procedure: LEFT VIDEO ASSISTED THORACOSCOPY;  Surgeon: Gaye Pollack, MD;  Location: [place];  Service: Thoracic;  Laterality: Left;    Family History  Problem Relation Age of Onset  . Adopted: Yes  . Alzheimer's disease Other   . Parkinson's disease Other   . Mental illness Other   . Colon cancer Neg Hx     adopted at 75 months old, unsure about GI history of parents     Social History   Social History  . Marital status: Divorced    Spouse name: N/A  . Number of children: 1  . Years of education: N/A   Occupational History  . disabled    Social History Main Topics  . Smoking status: Former Smoker    Packs/day: 0.75    Years: 25.00    Types: Cigars, Cigarettes    Quit date: 07/09/2014  . Smokeless tobacco: Current User    Types: Snuff     Comment: pt uses dip tobacco  . Alcohol use 0.0 oz/week     Comment: occ  . Drug use: No  . Sexual activity: Not on file   Other Topics  Concern  . Not on file   Social History Narrative   He is originally from [facility]. 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 Has always lived in [place]. Previously have traveled to [place], [place], [place], [place], [place]. Remote travel to [place]. 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 Lived in a house with mold around 2001-2002 as well as 2006. Has 2 dogs currently. No bird exposure. No hot tub exposure. Mainly walks  for fun. He was incarcerated for 2 years previously. No known TB exposure. Had previous negative PPDs last around 2010. Never lived in a homeless shelter but has eaten at them before.       ROS:  General: Negative for anorexia, weight loss, fever, chills, fatigue, weakness. Eyes: Negative for vision changes.  ENT: Negative for hoarseness, difficulty swallowing , nasal congestion. CV: Negative for chest pain, angina, palpitations, dyspnea on exertion, peripheral edema.  Respiratory: Negative for dyspnea at rest, dyspnea on exertion, cough, sputum, wheezing.  GI: See history of present illness. GU:  Negative for dysuria, hematuria, urinary incontinence, urinary frequency, nocturnal urination.  MS: Negative for joint pain, low back pain.  Derm: Negative for rash or itching.  Neuro: Negative for weakness, abnormal sensation, seizure, frequent headaches, memory loss, confusion.  Psych: Negative for anxiety, depression, suicidal ideation, hallucinations.  Endo: Negative for unusual weight change.  Heme: Negative for bruising or bleeding. Allergy: Negative for rash or hives.    Physical Examination:  BP 126/77   Pulse 83   Temp 98.4 F (36.9 C) (Oral)   Ht 5\' 11"  (1.803 m)   Wt 196 lb 7.2 oz (89.1 kg)   BMI 27.40 kg/m    General: Well-nourished, well-developed in no acute distress.  Head: Normocephalic, atraumatic.   Eyes: Conjunctiva pink, no icterus. Mouth: Oropharyngeal mucosa moist and pink , no lesions erythema or exudate. Neck: Supple without thyromegaly,  masses, or lymphadenopathy.  Lungs: Clear to auscultation bilaterally.  Heart: Regular rate and rhythm, no murmurs rubs or gallops.  Abdomen: Bowel sounds are normal, nontender, nondistended, no hepatosplenomegaly or masses, no abdominal bruits or    hernia , no rebound or guarding.   Rectal: Not performed Extremities: No lower extremity edema. No clubbing or deformities.  Neuro: Alert and oriented x 4 , grossly normal neurologically.  Skin: Warm and dry, no rash or jaundice.   Psych: Alert and cooperative, normal mood and affect.  Labs: Lab Results  Component Value Date   WBC 13.2 (H) 11/02/2016   HGB 16.0 11/02/2016   HCT 46.9 11/02/2016   MCV 93.4 11/02/2016   PLT 319 11/02/2016   Lab Results  Component Value Date   CREATININE 0.99 11/02/2016   BUN 10 11/02/2016   NA 134 (L) 11/02/2016   K 4.0 11/02/2016   CL 98 (L) 11/02/2016   CO2 27 11/02/2016   Lab Results  Component Value Date   ALT 21 11/02/2016   AST 19 11/02/2016   ALKPHOS 59 11/02/2016   BILITOT 0.8 11/02/2016   Lab Results  Component Value Date   LIPASE 25 11/02/2016     Imaging Studies: Ct Abdomen Pelvis W Contrast  Result Date: 11/02/2016 CLINICAL DATA:  Lower abdominal pain and pressure over the last 2 weeks with vomiting. EXAM: CT ABDOMEN AND PELVIS WITH CONTRAST TECHNIQUE: Multidetector CT imaging of the abdomen and pelvis was performed using the standard protocol following bolus administration of intravenous contrast. CONTRAST:  174mL ISOVUE-300 IOPAMIDOL (ISOVUE-300) INJECTION 61% COMPARISON:  Multiple exams, including 10/25/2013 FINDINGS: Lower chest: Contrast medium in the distal esophagus favoring reflux upper dysmotility. Wedge resections staple line laterally at the left lung base. Hepatobiliary: Unremarkable Pancreas: Unremarkable Spleen: Unremarkable Adrenals/Urinary Tract: Unremarkable Stomach/Bowel: Descending and sigmoid colon diverticulosis, with associated considerable wall thickening in  the sigmoid colon, and with abnormal inflammatory stranding in the sigmoid mesocolon shown on images 34 through 44 series 6 compatible with acute diverticulitis. No extraluminal gas or discrete drainable abscess. The appendix  appears normal. Vascular/Lymphatic: Left external iliac node 1.2 cm in short axis on image 71/2, formerly the same back in 2014. Reproductive: Unremarkable Other: No supplemental non-categorized findings. Musculoskeletal: Lumbar spondylosis and degenerative disc disease causing foraminal impingement at L2-3, L3-4, and L4-5. IMPRESSION: 1. Descending and sigmoid colon diverticulosis with mild active diverticulitis of the sigmoid colon. Strictly speaking, a sigmoid colon malignancy could be contributing to the wall thickening and local inflammation, but diverticulitis is considered much more likely. Colonoscopy follow up might be considered. 2. Gastroesophageal reflux. 3. Chronic mildly enlarged left external iliac lymph node, likely incidental. 4. Foraminal impingement at L2-3, L3-4, and L4-5 due to spondylosis and degenerative disc disease. Electronically Signed   By: Van Clines M.D.   On: 11/02/2016 14:09

## 2016-11-17 NOTE — Assessment & Plan Note (Signed)
46 year old gentleman with recent CT evidence of diverticulitis of the sigmoid colon. Also had some wall thickening, cannot exclude underlying malignancy. Clinically patient asymptomatic after 1 week of Cipro/Flagyl. He was also treated empirically for diverticulitis in October, required 2 rounds of antibiotics at that time. He has CT evidence of diverticulitis in 2014.  Patient has history of adenomatous colon polyps (tubulovillous adenoma the sigmoid colon 2015), due for surveillance colonoscopy at this time. Given recent CT findings is to have colonoscopy to rule out malignancy.  Plan for colonoscopy with deep sedation in the OR due to polypharmacy. Will plan to have this done into about 2-1/2-3 weeks from at the soonest given recent bout of diverticulitis.  I have discussed the risks, alternatives, benefits with regards to but not limited to the risk of reaction to medication, bleeding, infection, perforation and the patient is agreeable to proceed. Written consent to be obtained.  Patient would like colonoscopy report and path sent to [facility] when available.

## 2016-11-17 NOTE — Telephone Encounter (Signed)
Called and informed pt of pre-op appt 12/07/16 at 11:00am. Letter mailed.

## 2016-11-17 NOTE — Patient Instructions (Addendum)
1. Colonoscopy as scheduled. See separate instructions.  2. Please call and cancel your colonoscopy at [facility]. I cannot verify from our computer records that you are scheduled with them. If you want, you can call and let our nurse know the phone number/doctor's name on the letter your received from them so we can make sure they are aware you are already established with [place].

## 2016-11-22 ENCOUNTER — Encounter (HOSPITAL_COMMUNITY): Payer: Self-pay

## 2016-11-22 ENCOUNTER — Telehealth: Payer: Self-pay

## 2016-11-22 ENCOUNTER — Emergency Department (HOSPITAL_COMMUNITY): Payer: Medicaid Other

## 2016-11-22 ENCOUNTER — Emergency Department (HOSPITAL_COMMUNITY)
Admission: EM | Admit: 2016-11-22 | Discharge: 2016-11-22 | Disposition: A | Discharge status: Home / Self Care | Payer: Medicaid Other | Attending: Emergency Medicine | Admitting: Emergency Medicine

## 2016-11-22 DIAGNOSIS — Z79899 Other long term (current) drug therapy: Secondary | ICD-10-CM | POA: Insufficient documentation

## 2016-11-22 DIAGNOSIS — I10 Essential (primary) hypertension: Secondary | ICD-10-CM | POA: Diagnosis not present

## 2016-11-22 DIAGNOSIS — K5732 Diverticulitis of large intestine without perforation or abscess without bleeding: Secondary | ICD-10-CM

## 2016-11-22 DIAGNOSIS — J449 Chronic obstructive pulmonary disease, unspecified: Secondary | ICD-10-CM | POA: Diagnosis not present

## 2016-11-22 DIAGNOSIS — R102 Pelvic and perineal pain: Secondary | ICD-10-CM | POA: Diagnosis present

## 2016-11-22 DIAGNOSIS — F17290 Nicotine dependence, other tobacco product, uncomplicated: Secondary | ICD-10-CM | POA: Diagnosis not present

## 2016-11-22 LAB — URINALYSIS, ROUTINE W REFLEX MICROSCOPIC
Bacteria, UA: NONE SEEN
Bilirubin Urine: NEGATIVE
Glucose, UA: NEGATIVE mg/dL
Hgb urine dipstick: NEGATIVE
Ketones, ur: NEGATIVE mg/dL
Nitrite: NEGATIVE
Protein, ur: NEGATIVE mg/dL
Specific Gravity, Urine: 1.019 (ref 1.005–1.030)
pH: 6 (ref 5.0–8.0)

## 2016-11-22 LAB — CBC WITH DIFFERENTIAL/PLATELET
Basophils Absolute: 0.1 10*3/uL (ref 0.0–0.1)
Basophils Relative: 1 %
Eosinophils Absolute: 0.2 10*3/uL (ref 0.0–0.7)
Eosinophils Relative: 2 %
HCT: 43.8 % (ref 39.0–52.0)
Hemoglobin: 14.9 g/dL (ref 13.0–17.0)
Lymphocytes Relative: 32 %
Lymphs Abs: 3.1 10*3/uL (ref 0.7–4.0)
MCH: 31.1 pg (ref 26.0–34.0)
MCHC: 34 g/dL (ref 30.0–36.0)
MCV: 91.4 fL (ref 78.0–100.0)
Monocytes Absolute: 0.5 10*3/uL (ref 0.1–1.0)
Monocytes Relative: 5 %
Neutro Abs: 5.8 10*3/uL (ref 1.7–7.7)
Neutrophils Relative %: 60 %
Platelets: 246 10*3/uL (ref 150–400)
RBC: 4.79 MIL/uL (ref 4.22–5.81)
RDW: 12.5 % (ref 11.5–15.5)
WBC: 9.6 10*3/uL (ref 4.0–10.5)

## 2016-11-22 LAB — BASIC METABOLIC PANEL
Anion gap: 7 (ref 5–15)
BUN: 10 mg/dL (ref 6–20)
CO2: 28 mmol/L (ref 22–32)
Calcium: 9.3 mg/dL (ref 8.9–10.3)
Chloride: 104 mmol/L (ref 101–111)
Creatinine, Ser: 0.91 mg/dL (ref 0.61–1.24)
GFR calc Af Amer: >60 mL/min (ref >60)
GFR calc non Af Amer: >60 mL/min (ref >60)
Glucose, Bld: 98 mg/dL (ref 65–99)
Potassium: 3.7 mmol/L (ref 3.5–5.1)
Sodium: 139 mmol/L (ref 135–145)

## 2016-11-22 MED ORDER — METRONIDAZOLE 500 MG PO TABS: 500.0000 mg | ORAL_TABLET | Freq: Three times a day (TID) | ORAL | 0 refills | Status: DC

## 2016-11-22 MED ORDER — KETOROLAC TROMETHAMINE 30 MG/ML IJ SOLN
15.0000 mg | Freq: Once | INTRAMUSCULAR | Status: AC
  Administered 2016-11-22: 15 mg via INTRAVENOUS
  Filled 2016-11-22: qty 1

## 2016-11-22 MED ORDER — CIPROFLOXACIN HCL 500 MG PO TABS: 500.0000 mg | ORAL_TABLET | Freq: Two times a day (BID) | ORAL | 0 refills | Status: DC

## 2016-11-22 MED ORDER — HYDROMORPHONE HCL 1 MG/ML IJ SOLN
1.0000 mg | Freq: Once | INTRAMUSCULAR | Status: AC
  Administered 2016-11-22: 1 mg via INTRAVENOUS
  Filled 2016-11-22: qty 1

## 2016-11-22 MED ORDER — IOPAMIDOL (ISOVUE-300) INJECTION 61%
100.0000 mL | Freq: Once | INTRAVENOUS | Status: AC | PRN
  Administered 2016-11-22: 100 mL via INTRAVENOUS

## 2016-11-22 MED ORDER — SODIUM CHLORIDE 0.9 % IV BOLUS (SEPSIS)
1000.0000 mL | Freq: Once | INTRAVENOUS | Status: AC
  Administered 2016-11-22: 1000 mL via INTRAVENOUS

## 2016-11-22 MED ORDER — OXYCODONE-ACETAMINOPHEN 5-325 MG PO TABS: 1.0000 | ORAL_TABLET | ORAL | 0 refills | Status: DC | PRN

## 2016-11-22 MED ORDER — IOPAMIDOL (ISOVUE-300) INJECTION 61%
INTRAVENOUS | Status: AC
  Administered 2016-11-22: 30 mL
  Filled 2016-11-22: qty 30

## 2016-11-22 MED ORDER — METRONIDAZOLE 500 MG PO TABS
500.0000 mg | ORAL_TABLET | Freq: Once | ORAL | Status: AC
  Administered 2016-11-22: 500 mg via ORAL
  Filled 2016-11-22: qty 1

## 2016-11-22 MED ORDER — ONDANSETRON HCL 4 MG/2ML IJ SOLN
4.0000 mg | Freq: Once | INTRAMUSCULAR | Status: AC
  Administered 2016-11-22: 4 mg via INTRAVENOUS
  Filled 2016-11-22: qty 2

## 2016-11-22 MED ORDER — CIPROFLOXACIN HCL 250 MG PO TABS
500.0000 mg | ORAL_TABLET | Freq: Once | ORAL | Status: AC
  Administered 2016-11-22: 500 mg via ORAL
  Filled 2016-11-22: qty 2

## 2016-11-22 NOTE — Telephone Encounter (Signed)
Pt called office. He is having middle lower abd pain. Pain is in same area it was when he had diverticulitis last month. No nausea, vomiting, diarrhea. BM is regular. No fever. He took [place] for pain, it eased some. Rates pain as 8 when it is worse, otherwise pain is 3. Pain started today. He is scheduled for Colonoscopy 12/13/16. Advised pt I would send message to LSL who seen him at last OV. Informed pt she has left for the day and he would be called with recommendation.

## 2016-11-22 NOTE — ED Notes (Addendum)
Patient has followed up with Dr. Gala Romney and will be having a colonoscopy and endoscopy.  I have an appointment with urology this week due to having an elevated PSA per pt.  Patient states that he has been having frequent urination, but that has been going on since the last time he was here.

## 2016-11-22 NOTE — ED Provider Notes (Signed)
[facility] DEPT Provider Note   CSN: [id number] Arrival date & time: 11/22/16  1837    By signing my name below, I, Macon Large, attest that this documentation has been prepared under the direction and in the presence of Virgel Manifold, MD. Electronically Signed: Macon Large, ED Scribe. 11/22/16. 9:28 PM.  History   Chief Complaint Chief Complaint  Patient presents with  . Pelvic Pain   The history is provided by the patient. No language interpreter was used.   HPI Comments: Shawn [name] is a 46 y.o. male with PMHx of arthritis, GERD, COPD, hyperlipidemia, HTN and diverticulitis who presents to the Emergency Department complaining of moderate, intermittent, suprapubic pain onset a couple of weeks ago. He describes his pain as a "crushing and shooting" sensation. Pt states he was last seen in ED on 11/02/16 for similar symptoms. He notes he was placed on antibiotics, but reports minimal relief. He states his pain radiates to his testicles and rectum. Denies abdominal pain, blood in stool, nausea, vomiting. He states he has an upcoming appointment for urologist this week.   Past Medical History:  Diagnosis Date  . Anemia   . Arthritis   . Back pain   . Bipolar 1 disorder ([place])   . Bipolar disorder ([place])   . 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 COPD (chronic obstructive pulmonary disease) ([place])   . Diverticulitis   . Family history of adverse reaction to anesthesia    he was adopted  . GERD (gastroesophageal reflux disease)   . Heart disease    "irregular heart beat"  . HOH (hard of hearing)    biological parents are both deaf.   left ear is good.  . Hyperlipidemia   . Hypertension   . 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 Lung nodules   . Schizoaffective disorder, bipolar type ([place])   . Vomiting     Patient Active Problem List   Diagnosis Date Noted  . Abnormal CT scan, colon 11/17/2016  . Hx of adenomatous colonic polyps  11/17/2016  . Hypotension 04/23/2016  . Fever 04/23/2016  . Cough 04/23/2016  . Tick bites 04/23/2016  . Hypokalemia 04/23/2016  . 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 Multiple lung nodules on CT 01/23/2016  . 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 Schizoaffective disorder ([place]) 12/22/2015  . Environmental allergies 12/22/2015  . Arrhythmia 12/22/2015  . Hyperlipidemia 12/22/2015  . Multiple lung nodules   . Chronic obstructive pulmonary disease ([place]) 11/17/2015  . Abdominal pain, periumbilical [id number]  . Nausea with vomiting 07/03/2015  . Abnormal chest CT 07/03/2015  . Loose stools 10/30/2013  . Diverticulitis of colon (without mention of hemorrhage)(562.11) 10/01/2013  . GERD (gastroesophageal reflux disease) 10/01/2013    Past Surgical History:  Procedure Laterality Date  . BRAIN SURGERY  2005   after head injury, patient states had 2 large blood clots evacuated  . COLONOSCOPY WITH ESOPHAGOGASTRODUODENOSCOPY (EGD) N/A 12/05/2013   [id number] erosive relfux/HH/melanosis coli/colonic diverticulosis. tubulovillous adenoma removed. next tcs 11/2016  . FLEXIBLE BRONCHOSCOPY N/A 01/23/2016   Procedure: FLEXIBLE BRONCHOSCOPY;  Surgeon: Gaye Pollack, MD;  Location: [facility];  Service: Thoracic;  Laterality: N/A;  . LUNG BIOPSY N/A 01/23/2016   Procedure: LEFT LUNG BIOPSY;  Surgeon: Gaye Pollack, MD;  Location: [facility];  Service: Thoracic;  Laterality: N/A;  . VIDEO ASSISTED THORACOSCOPY Left 01/23/2016   Procedure: LEFT VIDEO ASSISTED THORACOSCOPY;  Surgeon: Fernande Boyden  Cyndia Bent, MD;  Location: [facility] OR;  Service: Thoracic;  Laterality: Left;       Home Medications    Prior to Admission medications   Medication Sig Start Date End Date Taking? Authorizing Provider  cetirizine (ZYRTEC) 10 MG chewable tablet Chew 10 mg by mouth daily as needed for allergies.    Historical Provider, MD  Cyanocobalamin (VITAMIN B-12) 5000 MCG TBDP Take 1 tablet by mouth daily.    Historical  Provider, MD  cyclobenzaprine (FLEXERIL) 10 MG tablet Take 10 mg by mouth 3 (three) times daily as needed for muscle spasms.    Historical Provider, MD  dexlansoprazole (DEXILANT) 60 MG capsule Take 1 capsule (60 mg total) by mouth daily. 07/07/16   Carlis Stable, NP  diclofenac (VOLTAREN) 75 MG EC tablet Take 75 mg by mouth 2 (two) times daily as needed for mild pain.     Historical Provider, MD  DULoxetine (CYMBALTA) 60 MG capsule Take 60 mg by mouth 2 (two) times daily. Reported on 05/03/2016    Historical Provider, MD  Elastic Bandages & Supports (LUMBAR BACK BRACE/SUPPORT PAD) MISC Wear as needed 07/01/16   Soyla Dryer, PA-C  FLUoxetine (PROZAC) 40 MG capsule Take 40 mg by mouth daily.    Historical Provider, MD  gabapentin (NEURONTIN) 300 MG capsule Take 300 mg by mouth 3 (three) times daily as needed (for pain).     Historical Provider, MD  lamoTRIgine (LAMICTAL) 150 MG tablet Take 300 mg by mouth daily.     Historical Provider, MD  lovastatin (MEVACOR) 20 MG tablet Take by mouth.    Historical Provider, MD  metFORMIN (GLUCOPHAGE) 500 MG tablet Take 500 mg by mouth every evening.    Historical Provider, MD  metoprolol tartrate (LOPRESSOR) 25 MG tablet Take 25 mg by mouth 2 (two) times daily.    Historical Provider, MD  Multiple Vitamin (MULTIVITAMIN) tablet Take 1 tablet by mouth daily.    Historical Provider, MD  OLANZapine (ZYPREXA) 10 MG tablet Take 10 mg by mouth at bedtime. For mood swings and help sleep and 5-10mg  as needed for agitation/paranoia    Historical Provider, MD  ondansetron (ZOFRAN) 4 MG tablet TAKE ONE TABLET BY MOUTH EVERY 8 HOURS AS NEEDED FOR NAUSEA OR VOMITING 04/20/16   Carlis Stable, NP  Potassium 99 MG TABS Take 1 tablet by mouth every morning.    Historical Provider, MD  ranitidine (ZANTAC) 150 MG capsule Take one daily before evening meal. 06/22/16   Mahala Menghini, PA-C  ranitidine (ZANTAC) 150 MG tablet TAKE 1 TABLET BY MOUTH ONCE DAILY BEFORE EVENING MEAL 11/05/16    Annitta Needs, NP  risperiDONE (RISPERDAL) 0.5 MG tablet Take 0.5-1 mg by mouth at bedtime.    Historical Provider, MD  Sod Picosulfate-Mag Ox-Cit Acd (Welcome) 10-3.5-12 MG-GM-GM PACK Take 1 Container by mouth as directed. 11/17/16   Daneil Dolin, MD  traZODone (DESYREL) 150 MG tablet Take 300 mg by mouth at bedtime as needed for sleep.     Historical Provider, MD    Family History Family History  Problem Relation Age of Onset  . Adopted: Yes  . Alzheimer's disease Other   . Parkinson's disease Other   . Mental illness Other   . Colon cancer Neg Hx     adopted at 96 months old, unsure about GI history of parents     Social History Social History  Substance Use Topics  . Smoking status: Former Smoker    Packs/day: 0.75  Years: 25.00    Types: Cigars, Cigarettes    Quit date: 07/09/2014  . Smokeless tobacco: Current User    Types: Snuff     Comment: pt uses dip tobacco  . Alcohol use 0.0 oz/week     Comment: occ     Allergies   Patient has no known allergies.   Review of Systems Review of Systems  Gastrointestinal: Positive for abdominal pain and rectal pain. Negative for blood in stool, nausea and vomiting.  Genitourinary: Positive for pelvic pain.  All other systems reviewed and are negative.    Physical Exam Updated Vital Signs BP 125/84 (BP Location: Right Arm)   Pulse 90   Temp 98.2 F (36.8 C) (Oral)   Resp 18   Ht 5\' 11"  (1.803 m)   Wt 196 lb (88.9 kg)   SpO2 95%   BMI 27.34 kg/m   Physical Exam  Constitutional: He appears well-developed and well-nourished.  HENT:  Head: Normocephalic and atraumatic.  Eyes: Conjunctivae are normal. Right eye exhibits no discharge. Left eye exhibits no discharge.  Pulmonary/Chest: Effort normal. No respiratory distress.  Abdominal: There is tenderness. No hernia.  Mild tenderness across the lower abdomen, worse suprapubically. Normal external genitalia. No concerning lesions. No scrotal swelling, no testicular  tenderness. Pt had some pain in bilaterally anal canals, no hernia appreciated. Perianal skin tag. Normal tone. Prostate mildly enlarged, not significantly tender.   Neurological: He is alert. Coordination normal.  Skin: Skin is warm and dry. No rash noted. He is not diaphoretic. No erythema.  Psychiatric: He has a normal mood and affect.  Nursing note and vitals reviewed.    ED Treatments / Results   DIAGNOSTIC STUDIES: Oxygen Saturation is 95% on RA, normal by my interpretation.    COORDINATION OF CARE: 9:07 PM Discussed treatment plan with pt at bedside and pt agreed to plan.   Labs (all labs ordered are listed, but only abnormal results are displayed) Labs Reviewed  URINALYSIS, ROUTINE W REFLEX MICROSCOPIC - Abnormal; Notable for the following:       Result Value   APPearance HAZY (*)    Leukocytes, UA TRACE (*)    All other components within normal limits  CBC WITH DIFFERENTIAL/PLATELET  BASIC METABOLIC PANEL  GC/CHLAMYDIA PROBE AMP ([place]) NOT AT [facility]    Ct Abdomen Pelvis W Contrast  Result Date: 11/22/2016 CLINICAL DATA:  Lower abdominal and rectal pain. EXAM: CT ABDOMEN AND PELVIS WITH CONTRAST TECHNIQUE: Multidetector CT imaging of the abdomen and pelvis was performed using the standard protocol following bolus administration of intravenous contrast. CONTRAST:  150mL ISOVUE-300 IOPAMIDOL (ISOVUE-300) INJECTION 61% COMPARISON:  CT 11/02/2016 FINDINGS: Lower chest: Motion artifact. No focal consolidation or pleural fluid. Surgical staple in the periphery of the left lung base. Enteric contrast is again seen in the distal esophagus. Hepatobiliary: No focal liver abnormality is seen. No gallstones, gallbladder wall thickening, or biliary dilatation. Pancreas: No ductal dilatation or inflammation. Spleen: Normal in size without focal abnormality. Adrenals/Urinary Tract: No adrenal nodule. Symmetric renal enhancement and excretion on delayed phase imaging. No hydronephrosis.  No perinephric edema. Urinary bladder is minimally distended, no intravesicular air or wall thickening. Stomach/Bowel: Tortuous sigmoid colon containing multiple colonic diverticula. Improved but persistent soft tissue stranding about the mid proximal sigmoid at site of prior inflammation. Mild associated sigmoid colonic wall thickening. No developing abscess. Additional noninflamed diverticula involving the descending colon. Moderate stool burden proximally. No evidence of perirectal abscess or inflammation. Normal appendix. No small bowel  dilatation or inflammation. Enteric contrast in the distal esophagus. Vascular/Lymphatic: Circumaortic left renal vein. No acute vascular finding. Unchanged 12 mm left external iliac node. No new adenopathy. Reproductive: Prostate is unremarkable. Other: No free air, free fluid, or intra-abdominal fluid collection. Musculoskeletal: There are no acute or suspicious osseous abnormalities. Stable degenerative change in the spine. IMPRESSION: 1. Persistent soft tissue stranding about the sigmoid colon with multiple colonic diverticula. Findings most suspicious for persistent or recurrent diverticulitis, at site of prior inflammation. No abscess or perforation. Follow-up colonoscopy after course of treatment could again be considered to exclude colonic neoplasm. 2. Enteric contrast again seen in the distal esophagus suggesting reflux or delayed motility. Electronically Signed   By: Jeb Levering M.D.   On: 11/22/2016 22:58   EKG  EKG Interpretation None       Radiology No results found.  Procedures Procedures (including critical care time)  Medications Ordered in ED Medications - No data to display   Initial Impression / Assessment and Plan / ED Course  I have reviewed the triage vital signs and the nursing notes.  Pertinent labs & imaging results that were available during my care of the patient were reviewed by me and considered in my medical decision making  (see chart for details).  Clinical Course     45yM with lower abdominal pain. CT is significant for diverticulitis without perforation or abscess. I feel is appropriate for outpatient treatment. Course of Cipro/Flagyl. Return processes discussed. Outpatient follow-up with GI otherwise.   Final Clinical Impressions(s) / ED Diagnoses   Final diagnoses:  Sigmoid diverticulitis    New Prescriptions New Prescriptions   No medications on file    I personally preformed the services scribed in my presence. The recorded information has been reviewed is accurate. Virgel Manifold, MD.      Virgel Manifold, MD 12/05/16 2123

## 2016-11-22 NOTE — ED Triage Notes (Signed)
I am having pain in the lower part of my stomach.  The pain shoots down into my testicles and into my rectum.  I was in here for the same thing in December and they put me on two antibiotics, unsure of the diagnosis.  Describes the discomfort as shooting pains.  Pain starts above the groin area.

## 2016-11-22 NOTE — ED Notes (Signed)
Pt states understanding of care given and follow up instructions.  Pt ambulated from ed with steady gait a/o

## 2016-11-23 ENCOUNTER — Telehealth: Payer: Self-pay | Admitting: Internal Medicine

## 2016-11-23 MED ORDER — METRONIDAZOLE 500 MG PO TABS: 500.0000 mg | ORAL_TABLET | Freq: Three times a day (TID) | ORAL | 0 refills | Status: DC

## 2016-11-23 MED ORDER — CIPROFLOXACIN HCL 500 MG PO TABS: 500.0000 mg | ORAL_TABLET | Freq: Two times a day (BID) | ORAL | 0 refills | Status: DC

## 2016-11-23 NOTE — Telephone Encounter (Signed)
Pt called. He does not have Cipro and Flagyl. He wants rx sent to [facility] in [place]. Informed pt we would let him know what Dr. Gala Romney says about his upcoming colonoscopy.

## 2016-11-23 NOTE — Addendum Note (Signed)
Addended by: Mahala Menghini on: 11/23/2016 09:18 AM   Modules accepted: Orders

## 2016-11-23 NOTE — Telephone Encounter (Signed)
Patient seen in ED last night. He was given 7 days of cipro and flagyl for CT documented persistent diverticulitis.  Would recommend treating total of 2 weeks. Please let the pharmacy know that we are calling in an additional 7 days of antibiotics so that patient can have a complete 2 weeks of Cipro and Flagyl. He needs an additional 7 days of Cipro at 500 mg twice a day and 7 days of Flagyl 500 mg 3 times a day.  I am waiting to hear from Dr. Gala Romney to see if we need to postpone patients colonoscopy. Further recommendations to follow.

## 2016-11-23 NOTE — Telephone Encounter (Signed)
LMOVM for pt to call office to let us know if he has picked up Cipro and Flagyl from pharmacy. Informed pt that [facility] wants him to take antibiotics for 2 weeks.

## 2016-11-23 NOTE — Telephone Encounter (Signed)
Please let patient know that we will have to reschedule his procedure per Dr. Gala Romney. He will need to be off antibiotics at least 4 weeks prior to procedure.  Let's get him back in to see [place] for follow-up in 2 weeks. If he is doing well at that time we will set him up for a colonoscopy with propofol.

## 2016-11-23 NOTE — Telephone Encounter (Signed)
See other phone note

## 2016-11-23 NOTE — Telephone Encounter (Signed)
Called pt this morning and informed him rx had been sent to [facility] in [place].

## 2016-11-23 NOTE — Telephone Encounter (Signed)
Called pt. Informed of Dr. Roseanne Kaufman recommendation to cancel upcoming colonoscopy. Procedure cancelled. OV scheduled for 12/07/16 at 11:30 with AB.

## 2016-11-23 NOTE — Telephone Encounter (Signed)
rx done

## 2016-11-23 NOTE — Telephone Encounter (Signed)
Pt was returning a call to MB. Please call him back at [phone]

## 2016-11-25 LAB — GC/CHLAMYDIA PROBE AMP (~~LOC~~) NOT AT ARMC
Chlamydia: NEGATIVE
Neisseria Gonorrhea: NEGATIVE

## 2016-11-30 ENCOUNTER — Telehealth: Payer: Self-pay | Admitting: Internal Medicine

## 2016-11-30 NOTE — Telephone Encounter (Signed)
Pt is aware of his OV for tomorrow at 9 am and the other appointment was cancelled

## 2016-11-30 NOTE — Telephone Encounter (Signed)
Pt called asking to speak with the nurse. I told him that she was busy with patients and could leave a message. He said that he has taken his antibiotics for a week now and is still hurting and going to the bathroom 3-4 times a day. He wants to be seen sooner than 1/30 (urgent spot). I told him that nothing was available at the moment and I would have the nurse call him. [phone]

## 2016-11-30 NOTE — Telephone Encounter (Signed)
Pt had diverticulitis. Please see if you can get him in sooner.

## 2016-12-01 ENCOUNTER — Ambulatory Visit (INDEPENDENT_AMBULATORY_CARE_PROVIDER_SITE_OTHER): Payer: Medicaid Other | Admitting: Gastroenterology

## 2016-12-01 ENCOUNTER — Telehealth: Payer: Self-pay | Admitting: Gastroenterology

## 2016-12-01 ENCOUNTER — Encounter: Payer: Self-pay | Admitting: Gastroenterology

## 2016-12-01 ENCOUNTER — Telehealth: Payer: Self-pay | Admitting: Internal Medicine

## 2016-12-01 VITALS — BP 120/79 | HR 76 | Temp 98.3°F | Ht 71.0 in | Wt 189.2 lb

## 2016-12-01 DIAGNOSIS — R399 Unspecified symptoms and signs involving the genitourinary system: Secondary | ICD-10-CM

## 2016-12-01 DIAGNOSIS — K5732 Diverticulitis of large intestine without perforation or abscess without bleeding: Secondary | ICD-10-CM | POA: Diagnosis not present

## 2016-12-01 LAB — URINALYSIS W MICROSCOPIC + REFLEX CULTURE
Bilirubin Urine: NEGATIVE
CASTS: NONE SEEN [LPF]
GLUCOSE, UA: NEGATIVE
HGB URINE DIPSTICK: NEGATIVE
NITRITE: POSITIVE — AB
PH: 5 (ref 5.0–8.0)
RBC / HPF: NONE SEEN RBC/HPF (ref <=2)
Specific Gravity, Urine: 1.029 (ref 1.001–1.035)
Squamous Epithelial / LPF: NONE SEEN [HPF] (ref <=5)
YEAST: NONE SEEN [HPF]

## 2016-12-01 NOTE — Telephone Encounter (Signed)
Spoke with Dr. Gala Romney. We will continue the Cipro and Flagyl for the full 14 days as planned. Colonoscopy still needs to be at least 4-6 weeks out. He will need to have a visit before the colonoscopy.  I see his UA has nitrites, leukocytes, is there a culture? It says "completed" but I can't see the culture  Please let him know I need to see the culture before deciding on antibiotic choice. There is a chance we could cover him with a different antibiotic for possible UTI that would also cover diverticular issues as well; I just need to see susceptibility on culture reports.

## 2016-12-01 NOTE — Telephone Encounter (Signed)
I spoke with patient. I am going to see if anything comes back by tomorrow. For now, follow a strict soft diet, continue Cipro and Flagyl. If he has marked improvement, perhaps we could get him on the books for right at 30 days from now. If not, we will need to have him come in for an office visit to arrange the colonoscopy.

## 2016-12-01 NOTE — Telephone Encounter (Signed)
Addressed in separate phone note.

## 2016-12-01 NOTE — Progress Notes (Signed)
Referring Provider: Jani Gravel, MD Primary Care Physician:  Jani Gravel, MD  Primary GI: Dr. Gala Romney   Chief Complaint  Patient presents with  . Diverticulitis    HPI:   Shawn [name] is a 46 y.o. 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 Treated empirically for probable diverticulitis with Cipro and Flagyl in early Oct 2017 by PCP; additional antibiotic therapy of Augmentin provided when seen by [place] Oct 19th due to question of persistent diverticulitis. As of note, he has a history of focal sigmoid diverticulitis in Nov 2014.   CT 12/26 first CT with mild active diverticulitis of sigmoid colon. CT 11/22/16 with persistent soft tissue stranding about the sigmoid colon with multiple colonic diverticula, suspicious for persistent or recurrent diverticulitis. I PERSONALLY REVIEWED CT with Dr. Jobe Igo, radiologist. He states the CT from December and January are fairly similar, showing wall thickening of sigmoid. No abscess, perforation, concerning findings.   Has been on antibiotics for about 8 days (Cipro and Flagyl). Notes lower abdominal discomfort, almost suprapubic. States it is shooting down into his groin area and rectum. Not eating nuts, tomatoes, etc. Has more frequent urination then normal, difficulty starting and stopping with urination. No fever. States sometimes has chills "around the house" and doesn't know if it's because of the weather. Appetite is not what it used to be. Has been having a BM 3-4 times per day and is runny, which started with the antibiotics. Baseline bowel habits once to twice a day. Chronic vomiting. Intermittent nausea. Vomiting picked up when he was around his dad who had a virus, but now back to baseline of a few times a week. GES normal in April 2017.  Dexilant helps with GERD symptoms. If gets overexerted, will throw up. Winds up throwing up with any exertion, helping dad with wood.   Upon review of weights, he has  lost approximately 5-10 lbs since Nov/December this year (weights have fluctuated in the mid 190s to 201). Today is 189.2.   Past Medical History:  Diagnosis Date  . Anemia   . Arthritis   . Back pain   . Bipolar 1 disorder ([place])   . Bipolar disorder ([place])   . 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 COPD (chronic obstructive pulmonary disease) ([place])   . Diverticulitis   . Family history of adverse reaction to anesthesia    he was adopted  . GERD (gastroesophageal reflux disease)   . Heart disease    "irregular heart beat"  . HOH (hard of hearing)    biological parents are both deaf.   left ear is good.  . Hyperlipidemia   . Hypertension   . 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 Lung nodules   . Schizoaffective disorder, bipolar type ([place])   . Vomiting     Past Surgical History:  Procedure Laterality Date  . BRAIN SURGERY  2005   after head injury, patient states had 2 large blood clots evacuated  . COLONOSCOPY WITH ESOPHAGOGASTRODUODENOSCOPY (EGD) N/A 12/05/2013   [id number] erosive relfux/HH/melanosis coli/colonic diverticulosis. tubulovillous adenoma removed. next tcs 11/2016  . FLEXIBLE BRONCHOSCOPY N/A 01/23/2016   Procedure: FLEXIBLE BRONCHOSCOPY;  Surgeon: Gaye Pollack, MD;  Location: [facility];  Service: Thoracic;  Laterality: N/A;  . LUNG BIOPSY N/A 01/23/2016   Procedure: LEFT LUNG BIOPSY;  Surgeon: Gaye Pollack, MD;  Location: [facility];  Service:  Thoracic;  Laterality: N/A;  . VIDEO ASSISTED THORACOSCOPY Left 01/23/2016   Procedure: LEFT VIDEO ASSISTED THORACOSCOPY;  Surgeon: Gaye Pollack, MD;  Location: [facility];  Service: Thoracic;  Laterality: Left;    Current Outpatient Prescriptions  Medication Sig Dispense Refill  . cetirizine (ZYRTEC) 10 MG chewable tablet Chew 10 mg by mouth daily.     . ciprofloxacin (CIPRO) 500 MG tablet Take 1 tablet (500 mg total) by mouth 2 (two) times daily. 28 tablet 0  . cyclobenzaprine (FLEXERIL) 10 MG tablet  Take 10 mg by mouth 3 (three) times daily as needed for muscle spasms.    Marland Kitchen dexlansoprazole (DEXILANT) 60 MG capsule Take 1 capsule (60 mg total) by mouth daily. 30 capsule 5  . diclofenac (VOLTAREN) 75 MG EC tablet Take 75 mg by mouth 2 (two) times daily.     . DULoxetine (CYMBALTA) 60 MG capsule Take 60 mg by mouth 2 (two) times daily. Reported on 05/03/2016    . Elastic Bandages & Supports (LUMBAR BACK BRACE/SUPPORT PAD) MISC Wear as needed 1 each 0  . FLUoxetine (PROZAC) 40 MG capsule Take 40 mg by mouth daily.    Marland Kitchen gabapentin (NEURONTIN) 300 MG capsule Take 300 mg by mouth 3 (three) times daily.     Marland Kitchen lamoTRIgine (LAMICTAL) 150 MG tablet Take 150 mg by mouth 2 (two) times daily.     Marland Kitchen lovastatin (MEVACOR) 20 MG tablet Take 20 mg by mouth at bedtime.     . metFORMIN (GLUCOPHAGE) 500 MG tablet Take 500 mg by mouth every evening.    . metoprolol tartrate (LOPRESSOR) 25 MG tablet Take 25 mg by mouth 2 (two) times daily.    . metroNIDAZOLE (FLAGYL) 500 MG tablet Take 1 tablet (500 mg total) by mouth 3 (three) times daily. 42 tablet 0  . Multiple Vitamin (MULTIVITAMIN) tablet Take 1 tablet by mouth daily.    Marland Kitchen OLANZapine (ZYPREXA) 10 MG tablet Take 10 mg by mouth at bedtime. For mood swings and help sleep and 5-10mg  as needed for agitation/paranoia    . ondansetron (ZOFRAN) 4 MG tablet TAKE ONE TABLET BY MOUTH EVERY 8 HOURS AS NEEDED FOR NAUSEA OR VOMITING 30 tablet 1  . Potassium 99 MG TABS Take 1 tablet by mouth every morning.    . ranitidine (ZANTAC) 150 MG tablet TAKE 1 TABLET BY MOUTH ONCE DAILY BEFORE EVENING MEAL 30 tablet 5  . risperiDONE (RISPERDAL) 0.5 MG tablet Take 1 mg by mouth at bedtime.     . Thiamine HCl (B-1) 500 MG TABS Take 5,000 mg by mouth daily.    . traZODone (DESYREL) 150 MG tablet Take 300 mg by mouth at bedtime.     Marland Kitchen oxyCODONE-acetaminophen (PERCOCET/ROXICET) 5-325 MG tablet Take 1-2 tablets by mouth every 4 (four) hours as needed for severe pain. (Patient not taking:  Reported on 12/01/2016) 12 tablet 0   No current facility-administered medications for this visit.     Allergies as of 12/01/2016  . (No Known Allergies)    Family History  Problem Relation Age of Onset  . Adopted: Yes  . Alzheimer's disease Other   . Parkinson's disease Other   . Mental illness Other   . Colon cancer Neg Hx     adopted at 98 months old, unsure about GI history of parents     Social History   Social History  . Marital status: Divorced    Spouse name: N/A  . Number of children: 1  . Years  of education: N/A   Occupational History  . disabled    Social History Main Topics  . Smoking status: Former Smoker    Packs/day: 0.75    Years: 25.00    Types: Cigars, Cigarettes    Quit date: 07/09/2014  . Smokeless tobacco: Current User    Types: Snuff     Comment: pt uses dip tobacco  . Alcohol use 0.0 oz/week     Comment: occ  . Drug use: No  . Sexual activity: Not Asked   Other Topics Concern  . None   Social History Narrative   He is originally from [place]. 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 Has always lived in [place]. Previously have traveled to [place], [place], [place], [place], [place]. Remote travel to [place]. 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 Lived in a house with mold around 2001-2002 as well as 2006. Has 2 dogs currently. No bird exposure. No hot tub exposure. Mainly walks for fun. He was incarcerated for 2 years previously. No known TB exposure. Had previous negative PPDs last around 2010. Never lived in a homeless shelter but has eaten at them before.     Review of Systems: As mentioned in HPI.   Physical Exam: BP 120/79   Pulse 76   Temp 98.3 F (36.8 C) (Oral)   Ht 5\' 11"  (1.803 m)   Wt 189 lb 3.2 oz (85.8 kg)   BMI 26.39 kg/m  General:   Alert and oriented. No distress noted. Pleasant and cooperative.  Head:  Normocephalic and atraumatic. Eyes:  Conjuctiva clear without scleral icterus. Mouth:  Oral mucosa pink and  moist. Good dentition. No lesions. Heart:  S1, S2 present without murmurs, rubs, or gallops. Regular rate and rhythm. Abdomen:  +BS, soft, very mild TTP suprapubic, no rebound or guarding. No LLQ discomfort. Non-distended. No HSM Extremities:  Without edema. Neurologic:  Alert and  oriented x4;  grossly normal neurologically. Skin:  Intact without significant lesions or rashes. Psych:  Alert and cooperative. Normal mood and affect.  Lab Results  Component Value Date   WBC 9.6 11/22/2016   HGB 14.9 11/22/2016   HCT 43.8 11/22/2016   MCV 91.4 11/22/2016   PLT 246 11/22/2016

## 2016-12-01 NOTE — Telephone Encounter (Signed)
Culture is pending. It could take a couple of days before the culture report is back and I informed the pt of that before he left the office.

## 2016-12-01 NOTE — Assessment & Plan Note (Signed)
46 year old male with recent CT evidence of diverticulitis of the sigmoid colon Nov 02, 2016, treated with Cipro and Flagyl. Repeat CT 11/22/16 virtually unchanged per radiology, showing persistent wall-thickening. He was given another round of Cipro and Flagyl to complete a total of 2 weeks as of 1/24, he is on day #8 of 14. He was also treated empirically for diverticulitis in October, requiring 2 rounds of antibiotics at that time (cipro/flagyl followed by augmentin empirically). He had CT evidence of diverticulitis in 2014. His symptoms have never truly gone away per his report, stating he does well for about 24 hours and then has recurrent suprapubic pain. He also notes urinary symptoms to include hesitancy, urgency, and frequent urination. No fever, and he does not appear acutely ill at time of visit today.   He ultimately needs a colonoscopy once things have settled to exclude any underlying malignancy. 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 His persistent symptoms are interesting, and he does not seem to fit the typical presentation of a diverticular flare (with suprapubic pain radiating to groin, rectal pain), although diverticulitis was documented on CT in Dec and Jan. I am going to check a UA with culture today and talk with Dr. Gala Romney further. I discussed with patient that a colonoscopy would have to be at a minimum of 4 weeks from now although. Further recommendations to follow.

## 2016-12-01 NOTE — Telephone Encounter (Signed)
Pt was seen this morning and has called several times this afternoon asking if AB had discussed with RMR about having him admitted for his stomach. I told him that AB has been seeing patients and is still seeing patients at the moment and she would call him to let him know after she talks with RMR. Pt agreed. [phone]

## 2016-12-01 NOTE — Patient Instructions (Signed)
I need to discuss your case with Dr. Gala Romney.  For now, continue the antibiotics you are on. I will be in touch with you shortly about the best option.

## 2016-12-02 LAB — URINE CULTURE: Organism ID, Bacteria: NO GROWTH

## 2016-12-02 NOTE — Progress Notes (Signed)
cc'ed to pcp °

## 2016-12-03 ENCOUNTER — Telehealth: Payer: Self-pay | Admitting: Gastroenterology

## 2016-12-03 ENCOUNTER — Other Ambulatory Visit: Payer: Self-pay | Admitting: Gastroenterology

## 2016-12-03 MED ORDER — SULFAMETHOXAZOLE-TRIMETHOPRIM 800-160 MG PO TABS: 1.0000 | ORAL_TABLET | Freq: Two times a day (BID) | ORAL | 0 refills | Status: DC

## 2016-12-03 MED ORDER — METRONIDAZOLE 500 MG PO TABS: 500.0000 mg | ORAL_TABLET | Freq: Three times a day (TID) | ORAL | 0 refills | Status: DC

## 2016-12-03 NOTE — Progress Notes (Signed)
Please arrange for a colonoscopy with Propofol with Dr. Gala Romney after March 2nd. Preferably that week. Needs to have a visit with me towards end of February to update H&P no charge.

## 2016-12-03 NOTE — Telephone Encounter (Signed)
Please arrange for a colonoscopy with Propofol with Dr. Gala Romney after March 2nd. Preferably that week. Needs to have a visit with me towards end of February to update H&P no charge.

## 2016-12-06 NOTE — Telephone Encounter (Signed)
Pt is coming in on  01/14/17 @ 9:00 to see AB. We are holding a spot for his TCS on 01/19/17 @ 7:30 am

## 2016-12-07 ENCOUNTER — Other Ambulatory Visit (HOSPITAL_COMMUNITY): Payer: Medicaid Other

## 2016-12-07 ENCOUNTER — Ambulatory Visit: Payer: Medicaid Other | Admitting: Gastroenterology

## 2016-12-13 ENCOUNTER — Ambulatory Visit (HOSPITAL_COMMUNITY): Admit: 2016-12-13 | Payer: Medicaid Other | Admitting: Internal Medicine

## 2016-12-13 ENCOUNTER — Encounter (HOSPITAL_COMMUNITY): Payer: Self-pay

## 2016-12-13 SURGERY — COLONOSCOPY WITH PROPOFOL
Anesthesia: Monitor Anesthesia Care

## 2016-12-15 ENCOUNTER — Ambulatory Visit: Payer: Medicaid Other | Admitting: Gastroenterology

## 2016-12-16 ENCOUNTER — Other Ambulatory Visit: Payer: Self-pay | Admitting: Gastroenterology

## 2016-12-28 ENCOUNTER — Ambulatory Visit: Payer: Medicaid Other | Admitting: Physician Assistant

## 2016-12-28 ENCOUNTER — Encounter: Payer: Self-pay | Admitting: Physician Assistant

## 2016-12-28 VITALS — BP 110/78 | HR 79 | Temp 98.1°F | Ht 71.0 in | Wt 186.0 lb

## 2016-12-28 DIAGNOSIS — F39 Unspecified mood [affective] disorder: Secondary | ICD-10-CM

## 2016-12-28 DIAGNOSIS — J984 Other disorders of lung: Secondary | ICD-10-CM

## 2016-12-28 DIAGNOSIS — K219 Gastro-esophageal reflux disease without esophagitis: Secondary | ICD-10-CM

## 2016-12-28 DIAGNOSIS — N529 Male erectile dysfunction, unspecified: Secondary | ICD-10-CM

## 2016-12-28 NOTE — Progress Notes (Signed)
BP 110/78 (BP Location: Left Arm, Patient Position: Sitting, Cuff Size: Normal)   Pulse 79   Temp 98.1 F (36.7 C) (Other (Comment))   Ht 5\' 11"  (1.803 m)   Wt 186 lb (84.4 kg)   SpO2 96%   BMI 25.94 kg/m    Subjective:    Patient ID: Shawn [name], male    DOB: 1971-02-25, 46 y.o.   MRN: [id number]  HPI: Shawn [name] is a 46 y.o. male presenting on 12/28/2016 for No chief complaint on file.   HPI   Pt still going to [facility] issues  Pt went to [facility] for his 2nd opinion.  He is going back in March.  Pt says a CT is planned.    Pt went to Rheumatology (at [facility]) in January and is returning there in March  Pt says GI had him on antibiotics for diverticulitis.  Per telephone note pt suppsed to f/u there on 01/14/17  Pt says his pharmacist told him medicaid doesn't pay for viagra  Pt says that [facility] started him on statin (pt had good lipid panel in august 2017) and he thinks the [facility] provider started the metformin even though he isn't diabetic.   Relevant past medical, surgical, family and social history reviewed and updated as indicated. Interim medical history since our last visit reviewed. Allergies and medications reviewed and updated.   Current Outpatient Prescriptions:  .  cyclobenzaprine (FLEXERIL) 10 MG tablet, Take 10 mg by mouth 3 (three) times daily as needed for muscle spasms., Disp: , Rfl:  .  dexlansoprazole (DEXILANT) 60 MG capsule, Take 1 capsule (60 mg total) by mouth daily., Disp: 30 capsule, Rfl: 5 .  diclofenac (VOLTAREN) 75 MG EC tablet, Take 75 mg by mouth 2 (two) times daily. , Disp: , Rfl:  .  DULoxetine (CYMBALTA) 60 MG capsule, Take 60 mg by mouth 2 (two) times daily. Reported on 05/03/2016, Disp: , Rfl:  .  Elastic Bandages & Supports (LUMBAR BACK BRACE/SUPPORT PAD) MISC, Wear as needed, Disp: 1 each, Rfl: 0 .  FLUoxetine (PROZAC) 40 MG capsule, Take 40 mg by mouth daily., Disp: , Rfl:  .  gabapentin (NEURONTIN) 300 MG  capsule, Take 300 mg by mouth 3 (three) times daily. , Disp: , Rfl:  .  lamoTRIgine (LAMICTAL) 150 MG tablet, Take 150 mg by mouth 2 (two) times daily. , Disp: , Rfl:  .  lovastatin (MEVACOR) 20 MG tablet, Take 20 mg by mouth at bedtime. , Disp: , Rfl:  .  metFORMIN (GLUCOPHAGE) 500 MG tablet, Take 500 mg by mouth every evening., Disp: , Rfl:  .  metoprolol tartrate (LOPRESSOR) 25 MG tablet, Take 25 mg by mouth 2 (two) times daily., Disp: , Rfl:  .  Multiple Vitamin (MULTIVITAMIN) tablet, Take 1 tablet by mouth daily., Disp: , Rfl:  .  OLANZapine (ZYPREXA) 10 MG tablet, Take 10 mg by mouth at bedtime. For mood swings and help sleep and 5-10mg  as needed for agitation/paranoia, Disp: , Rfl:  .  ondansetron (ZOFRAN) 4 MG tablet, TAKE ONE TABLET BY MOUTH EVERY 8 HOURS AS NEEDED FOR NAUSEA OR VOMITING, Disp: 30 tablet, Rfl: 1 .  oxyCODONE-acetaminophen (PERCOCET/ROXICET) 5-325 MG tablet, Take 1-2 tablets by mouth every 4 (four) hours as needed for severe pain., Disp: 12 tablet, Rfl: 0 .  Potassium 99 MG TABS, Take 1 tablet by mouth every morning., Disp: , Rfl:  .  ranitidine (ZANTAC) 150 MG tablet, TAKE 1 TABLET BY MOUTH  ONCE DAILY BEFORE EVENING MEAL, Disp: 30 tablet, Rfl: 5 .  risperiDONE (RISPERDAL) 0.5 MG tablet, Take 1 mg by mouth at bedtime. , Disp: , Rfl:  .  Thiamine HCl (B-1) 500 MG TABS, Take 5,000 mg by mouth daily., Disp: , Rfl:  .  traZODone (DESYREL) 150 MG tablet, Take 300 mg by mouth at bedtime. , Disp: , Rfl:    Review of Systems  Constitutional: Negative for appetite change, chills, diaphoresis, fatigue, fever and unexpected weight change.  HENT: Positive for congestion, hearing loss and sneezing. Negative for dental problem, drooling, ear pain, facial swelling, mouth sores, sore throat, trouble swallowing and voice change.   Eyes: Positive for itching. Negative for pain, discharge, redness and visual disturbance.  Respiratory: Positive for cough and shortness of breath. Negative for  choking and wheezing.   Cardiovascular: Negative for chest pain, palpitations and leg swelling.  Gastrointestinal: Negative for abdominal pain, blood in stool, constipation, diarrhea and vomiting.  Endocrine: Negative for cold intolerance, heat intolerance and polydipsia.  Genitourinary: Negative for decreased urine volume, dysuria and hematuria.  Musculoskeletal: Positive for arthralgias and back pain. Negative for gait problem.  Skin: Negative for rash.  Allergic/Immunologic: Negative for environmental allergies.  Neurological: Negative for seizures, syncope, light-headedness and headaches.  Hematological: Negative for adenopathy.  Psychiatric/Behavioral: Positive for dysphoric mood. Negative for agitation and suicidal ideas. The patient is not nervous/anxious.     Per HPI unless specifically indicated above     Objective:    BP 110/78 (BP Location: Left Arm, Patient Position: Sitting, Cuff Size: Normal)   Pulse 79   Temp 98.1 F (36.7 C) (Other (Comment))   Ht 5\' 11"  (1.803 m)   Wt 186 lb (84.4 kg)   SpO2 96%   BMI 25.94 kg/m   Wt Readings from Last 3 Encounters:  12/28/16 186 lb (84.4 kg)  12/01/16 189 lb 3.2 oz (85.8 kg)  11/22/16 196 lb (88.9 kg)    Physical Exam  Constitutional: He is oriented to person, place, and time. He appears well-developed and well-nourished.  HENT:  Head: Normocephalic and atraumatic.  Neck: Neck supple.  Cardiovascular: Normal rate and regular rhythm.   Pulmonary/Chest: Effort normal and breath sounds normal. He has no wheezes.  Abdominal: Soft. Bowel sounds are normal. There is no hepatosplenomegaly. There is no tenderness.  Musculoskeletal: He exhibits no edema.  Lymphadenopathy:    He has no cervical adenopathy.  Neurological: He is alert and oriented to person, place, and time.  Skin: Skin is warm and dry.  Psychiatric: He has a normal mood and affect. His behavior is normal.  Vitals reviewed.       Assessment & Plan:     Encounter Diagnoses  Name Primary?  . Erectile dysfunction, unspecified erectile dysfunction type Yes  . Mood disorder ([place])   . Lung disease   . Gastroesophageal reflux disease, esophagitis presence not specified     -encouraged pt to call medicaid benefits to ask about viagra or similar if he is still interested in medication to help his ED -pt to continue with [facility] issues -pt to continue with GI and pulmonology as scheduled. -f/u 3 months.  RTO sooner prn

## 2017-01-10 ENCOUNTER — Other Ambulatory Visit: Payer: Self-pay | Admitting: Physician Assistant

## 2017-01-10 MED ORDER — DEXLANSOPRAZOLE 60 MG PO CPDR: 60.0000 mg | DELAYED_RELEASE_CAPSULE | Freq: Every day | ORAL | 5 refills | Status: DC

## 2017-01-11 ENCOUNTER — Ambulatory Visit (INDEPENDENT_AMBULATORY_CARE_PROVIDER_SITE_OTHER): Payer: Medicaid Other | Admitting: Urology

## 2017-01-11 DIAGNOSIS — R972 Elevated prostate specific antigen [PSA]: Secondary | ICD-10-CM | POA: Diagnosis not present

## 2017-01-13 ENCOUNTER — Ambulatory Visit (INDEPENDENT_AMBULATORY_CARE_PROVIDER_SITE_OTHER): Payer: Self-pay | Admitting: Gastroenterology

## 2017-01-13 ENCOUNTER — Encounter: Payer: Self-pay | Admitting: Gastroenterology

## 2017-01-13 ENCOUNTER — Other Ambulatory Visit: Payer: Self-pay

## 2017-01-13 ENCOUNTER — Telehealth: Payer: Self-pay

## 2017-01-13 VITALS — BP 125/79 | HR 111 | Temp 98.2°F | Ht 71.0 in | Wt 181.8 lb

## 2017-01-13 DIAGNOSIS — Z86010 Personal history of colonic polyps: Secondary | ICD-10-CM

## 2017-01-13 DIAGNOSIS — K219 Gastro-esophageal reflux disease without esophagitis: Secondary | ICD-10-CM

## 2017-01-13 NOTE — Progress Notes (Signed)
Referring Provider: Jani Gravel, MD Primary Care Physician:  Soyla Dryer, PA-C Primary GI: Dr. Gala Romney   Chief Complaint  Patient presents with  . Diverticulitis    f/u, doing ok    HPI:   Shawn [name] is a 46 y.o. 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 Treated empirically for probable diverticulitis with Cipro and Flagyl in early Oct 2017 by PCP; additional antibiotic therapy of Augmentin provided when seen by [place] Oct 19th due to question of persistent diverticulitis. As of note, he has a history of focal sigmoid diverticulitis in Nov 2014.   CT 12/26 first CT documentation with mild active diverticulitis of sigmoid colon. CT 11/22/16 with persistent soft tissue stranding about the sigmoid colon with multiple colonic diverticula, suspicious for persistent or recurrent diverticulitis. I PERSONALLY REVIEWED CT with Dr. Jobe Igo, radiologist. He states the CT from December and January are fairly similar, showing wall thickening of sigmoid. No abscess, perforation, concerning findings. Originally treated with Cipro and Flagyl end of December, with another round of Cipro and Flagyl that was to complete end of Jan. There was a question of prostatitis due to clinical findings and UA, so he continued Flagyl for an additional week, stopped Cipro and started Bactrim DS one BID (as this is a treatment for diverticulitis as well and could cover any prostate concerns empirically).   Has history of chronic vomiting. Intermittent nausea. Vomiting picked up when he was around his dad who had a virus, but now back to baseline. GES normal in April 2017. Dexilant helps with GERD symptoms. Vomiting with exertion, sometimes for no reason. Feels it is somewhat improved now. No dysphagia. Abdominal pain resolved. No fever or chills. BMs are good.   Saw a urologist yesterday. Checking PSA. Last EGD in 2015 with mild erosive reflux esophagitis. Purposefully trying  to lose weight.    Past Medical History:  Diagnosis Date  . Anemia   . Arthritis   . Back pain   . Bipolar 1 disorder ([place])   . Bipolar disorder ([place])   . 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 COPD (chronic obstructive pulmonary disease) ([place])   . Diverticulitis   . Family history of adverse reaction to anesthesia    he was adopted  . GERD (gastroesophageal reflux disease)   . Heart disease    "irregular heart beat"  . HOH (hard of hearing)    biological parents are both deaf.   left ear is good.  . Hyperlipidemia   . Hypertension   . 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 Lung nodules   . Schizoaffective disorder, bipolar type ([employer] [facility])   . Vomiting     Past Surgical History:  Procedure Laterality Date  . BRAIN SURGERY  2005   after head injury, patient states had 2 large blood clots evacuated  . COLONOSCOPY WITH ESOPHAGOGASTRODUODENOSCOPY (EGD) N/A 12/05/2013   DJS:HFWY erosive relfux/HH/melanosis coli/colonic diverticulosis. tubulovillous adenoma removed. next tcs 11/2016  . FLEXIBLE BRONCHOSCOPY N/A 01/23/2016   Procedure: FLEXIBLE BRONCHOSCOPY;  Surgeon: Gaye Pollack, MD;  Location: [facility];  Service: Thoracic;  Laterality: N/A;  . LUNG BIOPSY N/A 01/23/2016   Procedure: LEFT LUNG BIOPSY;  Surgeon: Gaye Pollack, MD;  Location: [facility];  Service: Thoracic;  Laterality: N/A;  . VIDEO ASSISTED THORACOSCOPY Left 01/23/2016   Procedure: LEFT VIDEO ASSISTED THORACOSCOPY;  Surgeon: Gaye Pollack, MD;  Location: [facility];  Service: Thoracic;  Laterality: Left;    Current Outpatient Prescriptions  Medication Sig Dispense Refill  . cyclobenzaprine (FLEXERIL) 10 MG tablet Take 10 mg by mouth 3 (three) times daily as needed for muscle spasms.    Marland Kitchen dexlansoprazole (DEXILANT) 60 MG capsule Take 1 capsule (60 mg total) by mouth daily. 30 capsule 5  . diclofenac (VOLTAREN) 75 MG EC tablet Take 75 mg by mouth 2 (two) times daily.     . DULoxetine (CYMBALTA) 60 MG  capsule Take 60 mg by mouth 2 (two) times daily. Reported on 05/03/2016    . Elastic Bandages & Supports (LUMBAR BACK BRACE/SUPPORT PAD) MISC Wear as needed 1 each 0  . FLUoxetine (PROZAC) 40 MG capsule Take 40 mg by mouth daily.    Marland Kitchen gabapentin (NEURONTIN) 300 MG capsule Take 300 mg by mouth 3 (three) times daily.     Marland Kitchen lamoTRIgine (LAMICTAL) 150 MG tablet Take 150 mg by mouth 2 (two) times daily.     Marland Kitchen lovastatin (MEVACOR) 20 MG tablet Take 20 mg by mouth at bedtime.     . metFORMIN (GLUCOPHAGE) 500 MG tablet Take 500 mg by mouth every evening.    . metoprolol tartrate (LOPRESSOR) 25 MG tablet Take 25 mg by mouth 2 (two) times daily.    . Multiple Vitamin (MULTIVITAMIN) tablet Take 1 tablet by mouth daily.    Marland Kitchen OLANZapine (ZYPREXA) 10 MG tablet Take 10 mg by mouth at bedtime. For mood swings and help sleep and 5-10mg  as needed for agitation/paranoia    . ondansetron (ZOFRAN) 4 MG tablet TAKE ONE TABLET BY MOUTH EVERY 8 HOURS AS NEEDED FOR NAUSEA OR VOMITING 30 tablet 1  . oxyCODONE-acetaminophen (PERCOCET/ROXICET) 5-325 MG tablet Take 1-2 tablets by mouth every 4 (four) hours as needed for severe pain. 12 tablet 0  . Potassium 99 MG TABS Take 1 tablet by mouth every morning.    . ranitidine (ZANTAC) 150 MG tablet TAKE 1 TABLET BY MOUTH ONCE DAILY BEFORE EVENING MEAL 30 tablet 5  . risperiDONE (RISPERDAL) 0.5 MG tablet Take 1 mg by mouth at bedtime.     . Thiamine HCl (B-1) 500 MG TABS Take 500 mg by mouth daily.     . traZODone (DESYREL) 150 MG tablet Take 300 mg by mouth at bedtime.      No current facility-administered medications for this visit.     Allergies as of 01/13/2017  . (No Known Allergies)    Family History  Problem Relation Age of Onset  . Adopted: Yes  . Alzheimer's disease Other   . Parkinson's disease Other   . Mental illness Other   . Colon cancer Neg Hx     adopted at 42 months old, unsure about GI history of parents     Social History   Social History  .  Marital status: Divorced    Spouse name: N/A  . Number of children: 1  . Years of education: N/A   Occupational History  . disabled    Social History Main Topics  . Smoking status: Former Smoker    Packs/day: 0.75    Years: 25.00    Types: Cigars, Cigarettes    Quit date: 07/09/2014  . Smokeless tobacco: Current User    Types: Snuff     Comment: pt uses dip tobacco  . Alcohol use 0.0 oz/week     Comment: occ  . Drug use: No  . Sexual activity: Not Asked   Other  Topics Concern  . None   Social History Narrative   He is originally from [place]. 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 Has always lived in [place]. Previously have traveled to [place], [place], [place], [place], [place]. Remote travel to [place]. 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 Lived in a house with mold around 2001-2002 as well as 2006. Has 2 dogs currently. No bird exposure. No hot tub exposure. Mainly walks for fun. He was incarcerated for 2 years previously. No known TB exposure. Had previous negative PPDs last around 2010. Never lived in a homeless shelter but has eaten at them before.     Review of Systems: As mentioned in HPI   Physical Exam: BP 125/79   Pulse (!) 111   Temp 98.2 F (36.8 C) (Oral)   Ht 5\' 11"  (1.803 m)   Wt 181 lb 12.8 oz (82.5 kg)   BMI 25.36 kg/m  General:   Alert and oriented. No distress noted. Pleasant and cooperative.  Head:  Normocephalic and atraumatic. Eyes:  Conjuctiva clear without scleral icterus. Mouth:  Oral mucosa pink and moist. Good dentition. No lesions. Heart:  S1, S2 present without murmurs, rubs, or gallops. Regular rate and rhythm. Abdomen:  +BS, soft, non-tender and non-distended. No rebound or guarding. No HSM or masses noted. Msk:  Symmetrical without gross deformities. Normal posture. Extremities:  Without edema. Neurologic:  Alert and  oriented x4;  grossly normal neurologically. Psych:  Alert and cooperative. Normal mood and affect.

## 2017-01-13 NOTE — Telephone Encounter (Signed)
Pre-op appt changed to 01/14/17 at 3:15pm. Called and informed pt.

## 2017-01-13 NOTE — Patient Instructions (Signed)
Continue Dexilant once daily.    We are proceeding with a colonoscopy with Dr. Gala Romney in the near future!

## 2017-01-13 NOTE — Patient Instructions (Signed)
Shawn [name]  01/13/2017     @PREFPERIOPPHARMACY @   Your procedure is scheduled on  01/19/2017   Report to [facility] at  700  A.M.  Call this number if you have problems the morning of surgery:  [phone]   Remember:  Do not eat food or drink liquids after midnight.  Take these medicines the morning of surgery with A SIP OF WATER  Zyrtec, flexaril, dexilant, cymbalta, voltaren, prozac, neurontin, metoprolol, zyprexa, oxycodone, zofran, risperdal.   Do not wear jewelry, make-up or nail polish.  Do not wear lotions, powders, or perfumes, or deoderant.  Do not shave 48 hours prior to surgery.  Men may shave face and neck.  Do not bring valuables to the hospital.  [facility] is not responsible for any belongings or valuables.  Contacts, dentures or bridgework may not be worn into surgery.  Leave your suitcase in the car.  After surgery it may be brought to your room.  For patients admitted to the hospital, discharge time will be determined by your treatment team.  Patients discharged the day of surgery will not be allowed to drive home.   Name and phone number of your driver:   family Special instructions:  Follow the diet and prep instructions given to you by Dr Roseanne Kaufman office.  Please read over the following fact sheets that you were given. Anesthesia Post-op Instructions and Care and Recovery After Surgery       Colonoscopy, Adult A colonoscopy is an exam to look at the entire large intestine. During the exam, a lubricated, bendable tube is inserted into the anus and then passed into the rectum, colon, and other parts of the large intestine. A colonoscopy is often done as a part of normal colorectal screening or in response to certain symptoms, such as anemia, persistent diarrhea, abdominal pain, and blood in the stool. The exam can help screen for and diagnose medical problems, including:  Tumors.  Polyps.  Inflammation.  Areas of bleeding. Tell  a health care provider about:  Any allergies you have.  All medicines you are taking, including vitamins, herbs, eye drops, creams, and over-the-counter medicines.  Any problems you or family members have had with anesthetic medicines.  Any blood disorders you have.  Any surgeries you have had.  Any medical conditions you have.  Any problems you have had passing stool. What are the risks? Generally, this is a safe procedure. However, problems may occur, including:  Bleeding.  A tear in the intestine.  A reaction to medicines given during the exam.  Infection (rare). What happens before the procedure? Eating and drinking restrictions  Follow instructions from your health care provider about eating and drinking, which may include:  A few days before the procedure - follow a low-fiber diet. Avoid nuts, seeds, dried fruit, raw fruits, and vegetables.  1-3 days before the procedure - follow a clear liquid diet. Drink only clear liquids, such as clear broth or bouillon, black coffee or tea, clear juice, clear soft drinks or sports drinks, gelatin dessert, and popsicles. Avoid any liquids that contain red or purple dye.  On the day of the procedure - do not eat or drink anything during the 2 hours before the procedure, or within the time period that your health care provider recommends. Bowel prep  If you were prescribed an oral bowel prep to clean out your colon:  Take it as told by your health care  provider. Starting the day before your procedure, you will need to drink a large amount of medicated liquid. The liquid will cause you to have multiple loose stools until your stool is almost clear or light green.  If your skin or anus gets irritated from diarrhea, you may use these to relieve the irritation:  Medicated wipes, such as adult wet wipes with aloe and vitamin E.  A skin soothing-product like petroleum jelly.  If you vomit while drinking the bowel prep, take a break for  up to 60 minutes and then begin the bowel prep again. If vomiting continues and you cannot take the bowel prep without vomiting, call your health care provider. General instructions   Ask your health care provider about changing or stopping your regular medicines. This is especially important if you are taking diabetes medicines or blood thinners.  Plan to have someone take you home from the hospital or clinic. What happens during the procedure?  An IV tube may be inserted into one of your veins.  You will be given medicine to help you relax (sedative).  To reduce your risk of infection:  Your health care team will wash or sanitize their hands.  Your anal area will be washed with soap.  You will be asked to lie on your side with your knees bent.  Your health care provider will lubricate a long, thin, flexible tube. The tube will have a camera and a light on the end.  The tube will be inserted into your anus.  The tube will be gently eased through your rectum and colon.  Air will be delivered into your colon to keep it open. You may feel some pressure or cramping.  The camera will be used to take images during the procedure.  A small tissue sample may be removed from your body to be examined under a microscope (biopsy). If any potential problems are found, the tissue will be sent to a lab for testing.  If small polyps are found, your health care provider may remove them and have them checked for cancer cells.  The tube that was inserted into your anus will be slowly removed. The procedure may vary among health care providers and hospitals. What happens after the procedure?  Your blood pressure, heart rate, breathing rate, and blood oxygen level will be monitored until the medicines you were given have worn off.  Do not drive for 24 hours after the exam.  You may have a small amount of blood in your stool.  You may pass gas and have mild abdominal cramping or bloating due to  the air that was used to inflate your colon during the exam.  It is up to you to get the results of your procedure. Ask your health care provider, or the department performing the procedure, when your results will be ready. This information is not intended to replace advice given to you by your health care provider. Make sure you discuss any questions you have with your health care provider. Document Released: 10/22/2000 Document Revised: 08/25/2016 Document Reviewed: 01/06/2016 Elsevier Interactive Patient Education  2017 [employer]  Colonoscopy, Adult, Care After This sheet gives you information about how to care for yourself after your procedure. Your health care provider may also give you more specific instructions. If you have problems or questions, contact your health care provider. What can I expect after the procedure? After the procedure, it is common to have:  A small amount of blood in your stool  for 24 hours after the procedure.  Some gas.  Mild abdominal cramping or bloating. Follow these instructions at home: General instructions    For the first 24 hours after the procedure:  Do not drive or use machinery.  Do not sign important documents.  Do not drink alcohol.  Do your regular daily activities at a slower pace than normal.  Eat soft, easy-to-digest foods.  Rest often.  Take over-the-counter or prescription medicines only as told by your health care provider.  It is up to you to get the results of your procedure. Ask your health care provider, or the department performing the procedure, when your results will be ready. Relieving cramping and bloating   Try walking around when you have cramps or feel bloated.  Apply heat to your abdomen as told by your health care provider. Use a heat source that your health care provider recommends, such as a moist heat pack or a heating pad.  Place a towel between your skin and the heat source.  Leave the heat on for  20-30 minutes.  Remove the heat if your skin turns bright red. This is especially important if you are unable to feel pain, heat, or cold. You may have a greater risk of getting burned. Eating and drinking   Drink enough fluid to keep your urine clear or pale yellow.  Resume your normal diet as instructed by your health care provider. Avoid heavy or fried foods that are hard to digest.  Avoid drinking alcohol for as long as instructed by your health care provider. Contact a health care provider if:  You have blood in your stool 2-3 days after the procedure. Get help right away if:  You have more than a small spotting of blood in your stool.  You pass large blood clots in your stool.  Your abdomen is swollen.  You have nausea or vomiting.  You have a fever.  You have increasing abdominal pain that is not relieved with medicine. This information is not intended to replace advice given to you by your health care provider. Make sure you discuss any questions you have with your health care provider. Document Released: 06/08/2004 Document Revised: 07/19/2016 Document Reviewed: 01/06/2016 Elsevier Interactive Patient Education  2017 [place] Anesthesia is a term that refers to techniques, procedures, and medicines that help a person stay safe and comfortable during a medical procedure. Monitored anesthesia care, or sedation, is one type of anesthesia. Your anesthesia specialist may recommend sedation if you will be having a procedure that does not require you to be unconscious, such as:  Cataract surgery.  A dental procedure.  A biopsy.  A colonoscopy. During the procedure, you may receive a medicine to help you relax (sedative). There are three levels of sedation:  Mild sedation. At this level, you may feel awake and relaxed. You will be able to follow directions.  Moderate sedation. At this level, you will be sleepy. You may not remember the  procedure.  Deep sedation. At this level, you will be asleep. You will not remember the procedure. The more medicine you are given, the deeper your level of sedation will be. Depending on how you respond to the procedure, the anesthesia specialist may change your level of sedation or the type of anesthesia to fit your needs. An anesthesia specialist will monitor you closely during the procedure. Let your health care provider know about:  Any allergies you have.  All medicines you are taking,  including vitamins, herbs, eye drops, creams, and over-the-counter medicines.  Any use of steroids (by mouth or as a cream).  Any problems you or family members have had with sedatives and anesthetic medicines.  Any blood disorders you have.  Any surgeries you have had.  Any medical conditions you have, such as sleep apnea.  Whether you are pregnant or may be pregnant.  Any use of cigarettes, alcohol, or street drugs. What are the risks? Generally, this is a safe procedure. However, problems may occur, including:  Getting too much medicine (oversedation).  Nausea.  Allergic reaction to medicines.  Trouble breathing. If this happens, a breathing tube may be used to help with breathing. It will be removed when you are awake and breathing on your own.  Heart trouble.  Lung trouble. Before the procedure Staying hydrated  Follow instructions from your health care provider about hydration, which may include:  Up to 2 hours before the procedure - you may continue to drink clear liquids, such as water, clear fruit juice, black coffee, and plain tea. Eating and drinking restrictions  Follow instructions from your health care provider about eating and drinking, which may include:  8 hours before the procedure - stop eating heavy meals or foods such as meat, fried foods, or fatty foods.  6 hours before the procedure - stop eating light meals or foods, such as toast or cereal.  6 hours before  the procedure - stop drinking milk or drinks that contain milk.  2 hours before the procedure - stop drinking clear liquids. Medicines  Ask your health care provider about:  Changing or stopping your regular medicines. This is especially important if you are taking diabetes medicines or blood thinners.  Taking medicines such as aspirin and ibuprofen. These medicines can thin your blood. Do not take these medicines before your procedure if your health care provider instructs you not to. Tests and exams  You will have a physical exam.  You may have blood tests done to show:  How well your kidneys and liver are working.  How well your blood can clot.  General instructions  Plan to have someone take you home from the hospital or clinic.  If you will be going home right after the procedure, plan to have someone with you for 24 hours. What happens during the procedure?  Your blood pressure, heart rate, breathing, level of pain and overall condition will be monitored.  An IV tube will be inserted into one of your veins.  Your anesthesia specialist will give you medicines as needed to keep you comfortable during the procedure. This may mean changing the level of sedation.  The procedure will be performed. After the procedure  Your blood pressure, heart rate, breathing rate, and blood oxygen level will be monitored until the medicines you were given have worn off.  Do not drive for 24 hours if you received a sedative.  You may:  Feel sleepy, clumsy, or nauseous.  Feel forgetful about what happened after the procedure.  Have a sore throat if you had a breathing tube during the procedure.  Vomit. This information is not intended to replace advice given to you by your health care provider. Make sure you discuss any questions you have with your health care provider. Document Released: 07/21/2005 Document Revised: 04/02/2016 Document Reviewed: 02/15/2016 Elsevier Interactive  Patient Education  2017 [place], Care After These instructions provide you with information about caring for yourself after your procedure. Your  health care provider may also give you more specific instructions. Your treatment has been planned according to current medical practices, but problems sometimes occur. Call your health care provider if you have any problems or questions after your procedure. What can I expect after the procedure? After your procedure, it is common to:  Feel sleepy for several hours.  Feel clumsy and have poor balance for several hours.  Feel forgetful about what happened after the procedure.  Have poor judgment for several hours.  Feel nauseous or vomit.  Have a sore throat if you had a breathing tube during the procedure. Follow these instructions at home: For at least 24 hours after the procedure:    Do not:  Participate in activities in which you could fall or become injured.  Drive.  Use heavy machinery.  Drink alcohol.  Take sleeping pills or medicines that cause drowsiness.  Make important decisions or sign legal documents.  Take care of children on your own.  Rest. Eating and drinking   Follow the diet that is recommended by your health care provider.  If you vomit, drink water, juice, or soup when you can drink without vomiting.  Make sure you have little or no nausea before eating solid foods. General instructions   Have a responsible adult stay with you until you are awake and alert.  Take over-the-counter and prescription medicines only as told by your health care provider.  If you smoke, do not smoke without supervision.  Keep all follow-up visits as told by your health care provider. This is important. Contact a health care provider if:  You keep feeling nauseous or you keep vomiting.  You feel light-headed.  You develop a rash.  You have a fever. Get help right away if:  You have  trouble breathing. This information is not intended to replace advice given to you by your health care provider. Make sure you discuss any questions you have with your health care provider. Document Released: 02/15/2016 Document Revised: 06/16/2016 Document Reviewed: 02/15/2016 Elsevier Interactive Patient Education  2017 [employer].

## 2017-01-13 NOTE — Assessment & Plan Note (Signed)
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 As of note, he was treated for prolonged course of diverticulitis as outpatient as noted in HPI. However, clinically he is completely back to baseline and doing well, with last antibiotics ending in late January. Will pursue surveillance colonoscopy now.  Proceed with TCS with Dr. Gala Romney in near future: the risks, benefits, and alternatives have been discussed with the patient in detail. The patient states understanding and desires to proceed. PROPOFOL due to polypharmacy No metformin day of procedure. Take BP meds with sips of water day of procedure

## 2017-01-13 NOTE — Progress Notes (Signed)
cc'ed to pcp °

## 2017-01-13 NOTE — Assessment & Plan Note (Signed)
Occasional vomiting with exertion but Dexilant controlling reflux symptoms well. Taking Zantac prn at night. During diverticulitis flare, symptoms were worse; however, he is back to baseline. GES unremarkable. Purposeful weight loss, no dysphagia. Would continue dietary measures, Dexilant therapy, and monitor for now. EGD in 2015 with mild erosive reflux esophagitis. Hold on EGD unless unexplained weight loss, abdominal pain, dysphagia, any exacerbation of vomiting.

## 2017-01-14 ENCOUNTER — Encounter (HOSPITAL_COMMUNITY)
Admission: RE | Admit: 2017-01-14 | Discharge: 2017-01-14 | Disposition: A | Discharge status: Home / Self Care | Payer: Medicaid Other | Source: Ambulatory Visit | Attending: Internal Medicine | Admitting: Internal Medicine

## 2017-01-14 ENCOUNTER — Encounter (HOSPITAL_COMMUNITY): Payer: Self-pay

## 2017-01-14 DIAGNOSIS — Z01818 Encounter for other preprocedural examination: Secondary | ICD-10-CM | POA: Diagnosis not present

## 2017-01-14 DIAGNOSIS — Z86010 Personal history of colonic polyps: Secondary | ICD-10-CM | POA: Insufficient documentation

## 2017-01-14 HISTORY — DX: Cardiac murmur, unspecified: R01.1

## 2017-01-14 LAB — CBC WITH DIFFERENTIAL/PLATELET
BASOS ABS: 0.1 10*3/uL (ref 0.0–0.1)
BASOS PCT: 1 %
EOS ABS: 0.1 10*3/uL (ref 0.0–0.7)
EOS PCT: 2 %
HCT: 42.6 % (ref 39.0–52.0)
Hemoglobin: 14.9 g/dL (ref 13.0–17.0)
Lymphocytes Relative: 29 %
Lymphs Abs: 2.4 10*3/uL (ref 0.7–4.0)
MCH: 31.6 pg (ref 26.0–34.0)
MCHC: 35 g/dL (ref 30.0–36.0)
MCV: 90.4 fL (ref 78.0–100.0)
MONO ABS: 0.5 10*3/uL (ref 0.1–1.0)
Monocytes Relative: 6 %
Neutro Abs: 5.2 10*3/uL (ref 1.7–7.7)
Neutrophils Relative %: 62 %
PLATELETS: 244 10*3/uL (ref 150–400)
RBC: 4.71 MIL/uL (ref 4.22–5.81)
RDW: 13.1 % (ref 11.5–15.5)
WBC: 8.2 10*3/uL (ref 4.0–10.5)

## 2017-01-14 LAB — BASIC METABOLIC PANEL
ANION GAP: 8 (ref 5–15)
BUN: 11 mg/dL (ref 6–20)
CALCIUM: 9.6 mg/dL (ref 8.9–10.3)
CO2: 26 mmol/L (ref 22–32)
Chloride: 104 mmol/L (ref 101–111)
Creatinine, Ser: 0.92 mg/dL (ref 0.61–1.24)
GLUCOSE: 111 mg/dL — AB (ref 65–99)
Potassium: 3.9 mmol/L (ref 3.5–5.1)
Sodium: 138 mmol/L (ref 135–145)

## 2017-01-19 ENCOUNTER — Ambulatory Visit (HOSPITAL_COMMUNITY): Payer: Medicaid Other | Admitting: Anesthesiology

## 2017-01-19 ENCOUNTER — Ambulatory Visit (HOSPITAL_COMMUNITY)
Admission: RE | Admit: 2017-01-19 | Discharge: 2017-01-19 | Disposition: A | Discharge status: Home / Self Care | Payer: Medicaid Other | Source: Ambulatory Visit | Attending: Internal Medicine | Admitting: Internal Medicine

## 2017-01-19 ENCOUNTER — Encounter (HOSPITAL_COMMUNITY)
Admission: RE | Disposition: A | Discharge status: Home / Self Care | Payer: Self-pay | Source: Ambulatory Visit | Attending: Internal Medicine

## 2017-01-19 DIAGNOSIS — E785 Hyperlipidemia, unspecified: Secondary | ICD-10-CM | POA: Insufficient documentation

## 2017-01-19 DIAGNOSIS — Z86010 Personal history of colonic polyps: Secondary | ICD-10-CM | POA: Insufficient documentation

## 2017-01-19 DIAGNOSIS — K640 First degree hemorrhoids: Secondary | ICD-10-CM | POA: Insufficient documentation

## 2017-01-19 DIAGNOSIS — M1990 Unspecified osteoarthritis, unspecified site: Secondary | ICD-10-CM | POA: Diagnosis not present

## 2017-01-19 DIAGNOSIS — Z7984 Long term (current) use of oral hypoglycemic drugs: Secondary | ICD-10-CM | POA: Insufficient documentation

## 2017-01-19 DIAGNOSIS — D649 Anemia, unspecified: Secondary | ICD-10-CM | POA: Insufficient documentation

## 2017-01-19 DIAGNOSIS — D122 Benign neoplasm of ascending colon: Secondary | ICD-10-CM | POA: Diagnosis not present

## 2017-01-19 DIAGNOSIS — I739 Peripheral vascular disease, unspecified: Secondary | ICD-10-CM | POA: Insufficient documentation

## 2017-01-19 DIAGNOSIS — K5730 Diverticulosis of large intestine without perforation or abscess without bleeding: Secondary | ICD-10-CM

## 2017-01-19 DIAGNOSIS — Z1211 Encounter for screening for malignant neoplasm of colon: Secondary | ICD-10-CM | POA: Insufficient documentation

## 2017-01-19 DIAGNOSIS — Z87891 Personal history of nicotine dependence: Secondary | ICD-10-CM | POA: Diagnosis not present

## 2017-01-19 DIAGNOSIS — J449 Chronic obstructive pulmonary disease, unspecified: Secondary | ICD-10-CM | POA: Diagnosis not present

## 2017-01-19 DIAGNOSIS — I10 Essential (primary) hypertension: Secondary | ICD-10-CM | POA: Insufficient documentation

## 2017-01-19 DIAGNOSIS — F319 Bipolar disorder, unspecified: Secondary | ICD-10-CM | POA: Diagnosis not present

## 2017-01-19 DIAGNOSIS — K210 Gastro-esophageal reflux disease with esophagitis: Secondary | ICD-10-CM | POA: Diagnosis not present

## 2017-01-19 DIAGNOSIS — Z79899 Other long term (current) drug therapy: Secondary | ICD-10-CM | POA: Diagnosis not present

## 2017-01-19 HISTORY — PX: POLYPECTOMY: SHX5525

## 2017-01-19 HISTORY — PX: COLONOSCOPY WITH PROPOFOL: SHX5780

## 2017-01-19 SURGERY — COLONOSCOPY WITH PROPOFOL
Anesthesia: Monitor Anesthesia Care

## 2017-01-19 MED ORDER — FENTANYL CITRATE (PF) 100 MCG/2ML IJ SOLN
INTRAMUSCULAR | Status: AC
  Filled 2017-01-19: qty 2

## 2017-01-19 MED ORDER — MIDAZOLAM HCL 2 MG/2ML IJ SOLN
INTRAMUSCULAR | Status: AC
  Filled 2017-01-19: qty 2

## 2017-01-19 MED ORDER — MIDAZOLAM HCL 2 MG/2ML IJ SOLN
1.0000 mg | INTRAMUSCULAR | Status: AC
  Administered 2017-01-19: 2 mg via INTRAVENOUS

## 2017-01-19 MED ORDER — MIDAZOLAM HCL 5 MG/5ML IJ SOLN
INTRAMUSCULAR | Status: DC | PRN
  Administered 2017-01-19: 2 mg via INTRAVENOUS

## 2017-01-19 MED ORDER — LACTATED RINGERS IV SOLN
INTRAVENOUS | Status: DC
  Administered 2017-01-19: 07:00:00 via INTRAVENOUS

## 2017-01-19 MED ORDER — FENTANYL CITRATE (PF) 100 MCG/2ML IJ SOLN
25.0000 ug | INTRAMUSCULAR | Status: AC
  Administered 2017-01-19: 25 ug via INTRAVENOUS

## 2017-01-19 MED ORDER — PROPOFOL 500 MG/50ML IV EMUL
INTRAVENOUS | Status: DC | PRN
  Administered 2017-01-19: 09:00:00 via INTRAVENOUS
  Administered 2017-01-19: 125 ug/kg/min via INTRAVENOUS

## 2017-01-19 MED ORDER — STERILE WATER FOR IRRIGATION IR SOLN
Status: DC | PRN
  Administered 2017-01-19: 100 mL

## 2017-01-19 MED ORDER — PROPOFOL 10 MG/ML IV BOLUS
INTRAVENOUS | Status: AC
  Filled 2017-01-19: qty 40

## 2017-01-19 NOTE — Discharge Instructions (Addendum)
Colonoscopy Discharge Instructions  Read the instructions outlined below and refer to this sheet in the next few weeks. These discharge instructions provide you with general information on caring for yourself after you leave the hospital. Your doctor may also give you specific instructions. While your treatment has been planned according to the most current medical practices available, unavoidable complications occasionally occur. If you have any problems or questions after discharge, call Dr. Gala Romney at [phone]. ACTIVITY  You may resume your regular activity, but move at a slower pace for the next 24 hours.   Take frequent rest periods for the next 24 hours.   Walking will help get rid of the air and reduce the bloated feeling in your belly (abdomen).   No driving for 24 hours (because of the medicine (anesthesia) used during the test).    Do not sign any important legal documents or operate any machinery for 24 hours (because of the anesthesia used during the test).  NUTRITION  Drink plenty of fluids.   You may resume your normal diet as instructed by your doctor.   Begin with a light meal and progress to your normal diet. Heavy or fried foods are harder to digest and may make you feel sick to your stomach (nauseated).   Avoid alcoholic beverages for 24 hours or as instructed.  MEDICATIONS  You may resume your normal medications unless your doctor tells you otherwise.  WHAT YOU CAN EXPECT TODAY  Some feelings of bloating in the abdomen.   Passage of more gas than usual.   Spotting of blood in your stool or on the toilet paper.  IF YOU HAD POLYPS REMOVED DURING THE COLONOSCOPY:  No aspirin products for 7 days or as instructed.   No alcohol for 7 days or as instructed.   Eat a soft diet for the next 24 hours.  FINDING OUT THE RESULTS OF YOUR TEST Not all test results are available during your visit. If your test results are not back during the visit, make an appointment  with your caregiver to find out the results. Do not assume everything is normal if you have not heard from your caregiver or the medical facility. It is important for you to follow up on all of your test results.  SEEK IMMEDIATE MEDICAL ATTENTION IF:  You have more than a spotting of blood in your stool.   Your belly is swollen (abdominal distention).   You are nauseated or vomiting.   You have a temperature over 101.   You have abdominal pain or discomfort that is severe or gets worse throughout the day.    Diverticulosis Diverticulosis is a condition that develops when small pouches (diverticula) form in the wall of the large intestine (colon). The colon is where water is absorbed and stool is formed. The pouches form when the inside layer of the colon pushes through weak spots in the outer layers of the colon. You may have a few pouches or many of them. What are the causes? The cause of this condition is not known. What increases the risk? The following factors may make you more likely to develop this condition:  Being older than age 35. Your risk for this condition increases with age. Diverticulosis is rare among people younger than age 9. By age 47, many people have it.  Eating a low-fiber diet.  Having frequent constipation.  Being overweight.  Not getting enough exercise.  Smoking.  Taking over-the-counter pain medicines, like aspirin and ibuprofen.  Having  a family history of diverticulosis. What are the signs or symptoms? In most people, there are no symptoms of this condition. If you do have symptoms, they may include:  Bloating.  Cramps in the abdomen.  Constipation or diarrhea.  Pain in the lower left side of the abdomen. How is this diagnosed? This condition is most often diagnosed during an exam for other colon problems. Because diverticulosis usually has no symptoms, it often cannot be diagnosed independently. This condition may be diagnosed by:  Using  a flexible scope to examine the colon (colonoscopy).  Taking an X-ray of the colon after dye has been put into the colon (barium enema).  Doing a CT scan. How is this treated? You may not need treatment for this condition if you have never developed an infection related to diverticulosis. If you have had an infection before, treatment may include:  Eating a high-fiber diet. This may include eating more fruits, vegetables, and grains.  Taking a fiber supplement.  Taking a live bacteria supplement (probiotic).  Taking medicine to relax your colon.  Taking antibiotic medicines. Follow these instructions at home:  Drink 6-8 glasses of water or more each day to prevent constipation.  Try not to strain when you have a bowel movement.  If you have had an infection before:  Eat more fiber as directed by your health care provider or your diet and nutrition specialist (dietitian).  Take a fiber supplement or probiotic, if your health care provider approves.  Take over-the-counter and prescription medicines only as told by your health care provider.  If you were prescribed an antibiotic, take it as told by your health care provider. Do not stop taking the antibiotic even if you start to feel better.  Keep all follow-up visits as told by your health care provider. This is important. Contact a health care provider if:  You have pain in your abdomen.  You have bloating.  You have cramps.  You have not had a bowel movement in 3 days. Get help right away if:  Your pain gets worse.  Your bloating becomes very bad.  You have a fever or chills, and your symptoms suddenly get worse.  You vomit.  You have bowel movements that are bloody or black.  You have bleeding from your rectum. Summary  Diverticulosis is a condition that develops when small pouches (diverticula) form in the wall of the large intestine (colon).  You may have a few pouches or many of them.  This condition  is most often diagnosed during an exam for other colon problems.  If you have had an infection related to diverticulosis, treatment may include increasing the fiber in your diet, taking supplements, or taking medicines. This information is not intended to replace advice given to you by your health care provider. Make sure you discuss any questions you have with your health care provider. Document Released: 07/22/2004 Document Revised: 09/13/2016 Document Reviewed: 09/13/2016 Elsevier Interactive Patient Education  2017 [place].   Colon Polyps Polyps are tissue growths inside the body. Polyps can grow in many places, including the large intestine (colon). A polyp may be a round bump or a mushroom-shaped growth. You could have one polyp or several. Most colon polyps are noncancerous (benign). However, some colon polyps can become cancerous over time. What are the causes? The exact cause of colon polyps is not known. What increases the risk? This condition is more likely to develop in people who:  Have a family history of colon  cancer or colon polyps.  Are older than 57 or older than 45 if they are African American.  Have inflammatory bowel disease, such as ulcerative colitis or Crohn disease.  Are overweight.  Smoke cigarettes.  Do not get enough exercise.  Drink too much alcohol.  Eat a diet that is:  High in fat and red meat.  Low in fiber.  Had childhood cancer that was treated with abdominal radiation. What are the signs or symptoms? Most polyps do not cause symptoms. If you have symptoms, they may include:  Blood coming from your rectum when having a bowel movement.  Blood in your stool.The stool may look dark red or black.  A change in bowel habits, such as constipation or diarrhea. How is this diagnosed? This condition is diagnosed with a colonoscopy. This is a procedure that uses a lighted, flexible scope to look at the inside of your colon. How is this  treated? Treatment for this condition involves removing any polyps that are found. Those polyps will then be tested for cancer. If cancer is found, your health care provider will talk to you about options for colon cancer treatment. Follow these instructions at home: Diet   Eat plenty of fiber, such as fruits, vegetables, and whole grains.  Eat foods that are high in calcium and vitamin D, such as milk, cheese, yogurt, eggs, liver, fish, and broccoli.  Limit foods high in fat, red meats, and processed meats, such as hot dogs, sausage, bacon, and lunch meats.  Maintain a healthy weight, or lose weight if recommended by your health care provider. General instructions   Do not smoke cigarettes.  Do not drink alcohol excessively.  Keep all follow-up visits as told by your health care provider. This is important. This includes keeping regularly scheduled colonoscopies. Talk to your health care provider about when you need a colonoscopy.  Exercise every day or as told by your health care provider. Contact a health care provider if:  You have new or worsening bleeding during a bowel movement.  You have new or increased blood in your stool.  You have a change in bowel habits.  You unexpectedly lose weight. This information is not intended to replace advice given to you by your health care provider. Make sure you discuss any questions you have with your health care provider. Document Released: 07/21/2004 Document Revised: 04/01/2016 Document Reviewed: 09/15/2015 Elsevier Interactive Patient Education  2017 [employer]   Colon diverticulosis and polyp information provided  Further recommendations to follow pending review of pathology report  Office visit with [place] in 3 months

## 2017-01-19 NOTE — Anesthesia Postprocedure Evaluation (Signed)
Anesthesia Post Note  Patient: Shawn [name]  Procedure(s) Performed: Procedure(s) (LRB): COLONOSCOPY WITH PROPOFOL (N/A) POLYPECTOMY  Patient location during evaluation: PACU Anesthesia Type: MAC Level of consciousness: sedated Pain management: pain level controlled Vital Signs Assessment: post-procedure vital signs reviewed and stable Respiratory status: spontaneous breathing, nonlabored ventilation and respiratory function stable Cardiovascular status: blood pressure returned to baseline Postop Assessment: no signs of nausea or vomiting Anesthetic complications: no     Last Vitals:  Vitals:   01/19/17 0720 01/19/17 0725  BP: 122/88 117/82  Resp: 11 15  Temp: 36.7 C     Last Pain: There were no vitals filed for this visit.               Bertrand Vowels J

## 2017-01-19 NOTE — H&P (View-Only) (Signed)
Referring Provider: Jani Gravel, MD Primary Care Physician:  Soyla Dryer, PA-C Primary GI: Dr. Gala Romney   Chief Complaint  Patient presents with  . Diverticulitis    f/u, doing ok    HPI:   Shawn [name] is a 46 y.o. 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 Treated empirically for probable diverticulitis with Cipro and Flagyl in early Oct 2017 by PCP; additional antibiotic therapy of Augmentin provided when seen by [place] Oct 19th due to question of persistent diverticulitis. As of note, he has a history of focal sigmoid diverticulitis in Nov 2014.   CT 12/26 first CT documentation with mild active diverticulitis of sigmoid colon. CT 11/22/16 with persistent soft tissue stranding about the sigmoid colon with multiple colonic diverticula, suspicious for persistent or recurrent diverticulitis. I PERSONALLY REVIEWED CT with Dr. Jobe Igo, radiologist. He states the CT from December and January are fairly similar, showing wall thickening of sigmoid. No abscess, perforation, concerning findings. Originally treated with Cipro and Flagyl end of December, with another round of Cipro and Flagyl that was to complete end of Jan. There was a question of prostatitis due to clinical findings and UA, so he continued Flagyl for an additional week, stopped Cipro and started Bactrim DS one BID (as this is a treatment for diverticulitis as well and could cover any prostate concerns empirically).   Has history of chronic vomiting. Intermittent nausea. Vomiting picked up when he was around his dad who had a virus, but now back to baseline. GES normal in April 2017. Dexilant helps with GERD symptoms. Vomiting with exertion, sometimes for no reason. Feels it is somewhat improved now. No dysphagia. Abdominal pain resolved. No fever or chills. BMs are good.   Saw a urologist yesterday. Checking PSA. Last EGD in 2015 with mild erosive reflux esophagitis. Purposefully trying  to lose weight.    Past Medical History:  Diagnosis Date  . Anemia   . Arthritis   . Back pain   . Bipolar 1 disorder ([place])   . Bipolar disorder ([place])   . 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 COPD (chronic obstructive pulmonary disease) ([place])   . Diverticulitis   . Family history of adverse reaction to anesthesia    he was adopted  . GERD (gastroesophageal reflux disease)   . Heart disease    "irregular heart beat"  . HOH (hard of hearing)    biological parents are both deaf.   left ear is good.  . Hyperlipidemia   . Hypertension   . 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 Lung nodules   . Schizoaffective disorder, bipolar type ([place])   . Vomiting     Past Surgical History:  Procedure Laterality Date  . BRAIN SURGERY  2005   after head injury, patient states had 2 large blood clots evacuated  . COLONOSCOPY WITH ESOPHAGOGASTRODUODENOSCOPY (EGD) N/A 12/05/2013   DUK:GURK erosive relfux/HH/melanosis coli/colonic diverticulosis. tubulovillous adenoma removed. next tcs 11/2016  . FLEXIBLE BRONCHOSCOPY N/A 01/23/2016   Procedure: FLEXIBLE BRONCHOSCOPY;  Surgeon: Gaye Pollack, MD;  Location: [facility];  Service: Thoracic;  Laterality: N/A;  . LUNG BIOPSY N/A 01/23/2016   Procedure: LEFT LUNG BIOPSY;  Surgeon: Gaye Pollack, MD;  Location: [facility];  Service: Thoracic;  Laterality: N/A;  . VIDEO ASSISTED THORACOSCOPY Left 01/23/2016   Procedure: LEFT VIDEO ASSISTED THORACOSCOPY;  Surgeon: Gaye Pollack, MD;  Location: [facility];  Service: Thoracic;  Laterality: Left;    Current Outpatient Prescriptions  Medication Sig Dispense Refill  . cyclobenzaprine (FLEXERIL) 10 MG tablet Take 10 mg by mouth 3 (three) times daily as needed for muscle spasms.    Marland Kitchen dexlansoprazole (DEXILANT) 60 MG capsule Take 1 capsule (60 mg total) by mouth daily. 30 capsule 5  . diclofenac (VOLTAREN) 75 MG EC tablet Take 75 mg by mouth 2 (two) times daily.     . DULoxetine (CYMBALTA) 60 MG  capsule Take 60 mg by mouth 2 (two) times daily. Reported on 05/03/2016    . Elastic Bandages & Supports (LUMBAR BACK BRACE/SUPPORT PAD) MISC Wear as needed 1 each 0  . FLUoxetine (PROZAC) 40 MG capsule Take 40 mg by mouth daily.    Marland Kitchen gabapentin (NEURONTIN) 300 MG capsule Take 300 mg by mouth 3 (three) times daily.     Marland Kitchen lamoTRIgine (LAMICTAL) 150 MG tablet Take 150 mg by mouth 2 (two) times daily.     Marland Kitchen lovastatin (MEVACOR) 20 MG tablet Take 20 mg by mouth at bedtime.     . metFORMIN (GLUCOPHAGE) 500 MG tablet Take 500 mg by mouth every evening.    . metoprolol tartrate (LOPRESSOR) 25 MG tablet Take 25 mg by mouth 2 (two) times daily.    . Multiple Vitamin (MULTIVITAMIN) tablet Take 1 tablet by mouth daily.    Marland Kitchen OLANZapine (ZYPREXA) 10 MG tablet Take 10 mg by mouth at bedtime. For mood swings and help sleep and 5-10mg  as needed for agitation/paranoia    . ondansetron (ZOFRAN) 4 MG tablet TAKE ONE TABLET BY MOUTH EVERY 8 HOURS AS NEEDED FOR NAUSEA OR VOMITING 30 tablet 1  . oxyCODONE-acetaminophen (PERCOCET/ROXICET) 5-325 MG tablet Take 1-2 tablets by mouth every 4 (four) hours as needed for severe pain. 12 tablet 0  . Potassium 99 MG TABS Take 1 tablet by mouth every morning.    . ranitidine (ZANTAC) 150 MG tablet TAKE 1 TABLET BY MOUTH ONCE DAILY BEFORE EVENING MEAL 30 tablet 5  . risperiDONE (RISPERDAL) 0.5 MG tablet Take 1 mg by mouth at bedtime.     . Thiamine HCl (B-1) 500 MG TABS Take 500 mg by mouth daily.     . traZODone (DESYREL) 150 MG tablet Take 300 mg by mouth at bedtime.      No current facility-administered medications for this visit.     Allergies as of 01/13/2017  . (No Known Allergies)    Family History  Problem Relation Age of Onset  . Adopted: Yes  . Alzheimer's disease Other   . Parkinson's disease Other   . Mental illness Other   . Colon cancer Neg Hx     adopted at 53 months old, unsure about GI history of parents     Social History   Social History  .  Marital status: Divorced    Spouse name: N/A  . Number of children: 1  . Years of education: N/A   Occupational History  . disabled    Social History Main Topics  . Smoking status: Former Smoker    Packs/day: 0.75    Years: 25.00    Types: Cigars, Cigarettes    Quit date: 07/09/2014  . Smokeless tobacco: Current User    Types: Snuff     Comment: pt uses dip tobacco  . Alcohol use 0.0 oz/week     Comment: occ  . Drug use: No  . Sexual activity: Not Asked   Other  Topics Concern  . None   Social History Narrative   He is originally from [place]. 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 Has always lived in [place]. Previously have traveled to [place], [place], [place], [place], [place]. Remote travel to [place]. 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 Lived in a house with mold around 2001-2002 as well as 2006. Has 2 dogs currently. No bird exposure. No hot tub exposure. Mainly walks for fun. He was incarcerated for 2 years previously. No known TB exposure. Had previous negative PPDs last around 2010. Never lived in a homeless shelter but has eaten at them before.     Review of Systems: As mentioned in HPI   Physical Exam: BP 125/79   Pulse (!) 111   Temp 98.2 F (36.8 C) (Oral)   Ht 5\' 11"  (1.803 m)   Wt 181 lb 12.8 oz (82.5 kg)   BMI 25.36 kg/m  General:   Alert and oriented. No distress noted. Pleasant and cooperative.  Head:  Normocephalic and atraumatic. Eyes:  Conjuctiva clear without scleral icterus. Mouth:  Oral mucosa pink and moist. Good dentition. No lesions. Heart:  S1, S2 present without murmurs, rubs, or gallops. Regular rate and rhythm. Abdomen:  +BS, soft, non-tender and non-distended. No rebound or guarding. No HSM or masses noted. Msk:  Symmetrical without gross deformities. Normal posture. Extremities:  Without edema. Neurologic:  Alert and  oriented x4;  grossly normal neurologically. Psych:  Alert and cooperative. Normal mood and affect.

## 2017-01-19 NOTE — Transfer of Care (Signed)
Immediate Anesthesia Transfer of Care Note  Patient: Shawn [name]  Procedure(s) Performed: Procedure(s) with comments: COLONOSCOPY WITH PROPOFOL (N/A) - 8:30am POLYPECTOMY - ascending colon polyp  Patient Location: PACU  Anesthesia Type:MAC  Level of Consciousness: sedated  Airway & Oxygen Therapy: Patient Spontanous Breathing and Patient connected to face mask oxygen  Post-op Assessment: Report given to RN  Post vital signs: Reviewed and stable  Last Vitals:  Vitals:   01/19/17 0720 01/19/17 0725  BP: 122/88 117/82  Resp: 11 15  Temp: 36.7 C     Last Pain: There were no vitals filed for this visit.       Complications: No apparent anesthesia complications

## 2017-01-19 NOTE — Anesthesia Preprocedure Evaluation (Signed)
Anesthesia Evaluation  Patient identified by MRN, date of birth, ID band Patient awake    Reviewed: Allergy & Precautions, NPO status , Patient's Chart, lab work & pertinent test results, reviewed documented beta blocker date and time   Airway Mallampati: I  TM Distance: >3 FB Neck ROM: Full    Dental  (+) Teeth Intact   Pulmonary COPD,  COPD inhaler, former smoker,     + decreased breath sounds      Cardiovascular hypertension, Pt. on medications and Pt. on home beta blockers + Peripheral Vascular Disease  + dysrhythmias  Rhythm:Regular Rate:Normal     Neuro/Psych PSYCHIATRIC DISORDERS Bipolar Disorder Schizophrenia negative neurological ROS     GI/Hepatic Neg liver ROS, GERD  ,  Endo/Other  negative endocrine ROS  Renal/GU negative Renal ROS  negative genitourinary   Musculoskeletal  (+) Arthritis , Osteoarthritis,    Abdominal   Peds negative pediatric ROS (+)  Hematology   Anesthesia Other Findings   Reproductive/Obstetrics                            Anesthesia Physical Anesthesia Plan  ASA: III  Anesthesia Plan: MAC   Post-op Pain Management:    Induction: Intravenous  Airway Management Planned: Simple Face Mask  Additional Equipment:   Intra-op Plan:   Post-operative Plan:   Informed Consent: I have reviewed the patients History and Physical, chart, labs and discussed the procedure including the risks, benefits and alternatives for the proposed anesthesia with the patient or authorized representative who has indicated his/her understanding and acceptance.     Plan Discussed with:   Anesthesia Plan Comments:         Anesthesia Quick Evaluation

## 2017-01-19 NOTE — Progress Notes (Signed)
Pt would like to have endoscopy done today also, because of vomiting. Waiting to talk with Dr Gala Romney.

## 2017-01-19 NOTE — Op Note (Signed)
[facility] Patient Name: Shawn [name] Procedure Date: 01/19/2017 7:57 AM MRN: [id number] Date of Birth: Oct 22, 1971 Attending MD: Norvel Richards , MD CSN: [id number] Age: 46 Admit Type: Outpatient Procedure:                Colonoscopy Indications:              High risk colon cancer surveillance: Personal                            history of colonic polyps Providers:                Norvel Richards, MD, Rosina Lowenstein, RN, Aram Candela Referring MD:              Medicines:                Propofol per Anesthesia Complications:            No immediate complications. Estimated Blood Loss:     Estimated blood loss was minimal. Procedure:                Pre-Anesthesia Assessment:                           - Prior to the procedure, a History and Physical                            was performed, and patient medications and                            allergies were reviewed. The patient's tolerance of                            previous anesthesia was also reviewed. The risks                            and benefits of the procedure and the sedation                            options and risks were discussed with the patient.                            All questions were answered, and informed consent                            was obtained. Prior Anticoagulants: The patient has                            taken no previous anticoagulant or antiplatelet                            agents. ASA Grade Assessment: II - A patient with  mild systemic disease. After reviewing the risks                            and benefits, the patient was deemed in                            satisfactory condition to undergo the procedure.                           After obtaining informed consent, the colonoscope                            was passed under direct vision. Throughout the                            procedure, the patient's blood  pressure, pulse, and                            oxygen saturations were monitored continuously. The                            EC-[id number] ([id number]) scope was introduced through                            the and advanced to the the cecum, identified by                            appendiceal orifice and ileocecal valve. The entire                            colon was well visualized. The colonoscopy was                            performed without difficulty. The patient tolerated                            the procedure well. The quality of the bowel                            preparation was adequate. Anatomical landmarks were                            photographed. Scope In: 8:43:38 AM Scope Out: 9:02:00 AM Scope Withdrawal Time: 0 hours 12 minutes 36 seconds  Total Procedure Duration: 0 hours 18 minutes 22 seconds  Findings:      The perianal and digital rectal examinations were normal.      Scattered small and large-mouthed diverticula were found in the entire       colon.      A 8 mm polyp was found in the ascending colon. The polyp was sessile.       The polyp was removed with a cold snare. Resection and retrieval were       complete. Estimated blood loss was minimal.      Internal hemorrhoids were found during retroflexion. The hemorrhoids  were Grade I (internal hemorrhoids that do not prolapse).      The exam was otherwise without abnormality on direct and retroflexion       views. Impression:               - Diverticulosis in the entire examined colon.                           - One 8 mm polyp in the ascending colon, removed                            with a cold snare. Resected and retrieved.                           - Internal hemorrhoids.                           - The examination was otherwise normal on direct                            and retroflexion views. Patient concerned about                            ongoing intermittent nausea and vomiting. He                             inquired about an EGD. No indication today. History                            of diabetes polypharmacy including opioid therapy.                            May have an element of gastroparesis. Moderate Sedation:      Moderate (conscious) sedation was personally administered by an       anesthesia professional. The following parameters were monitored: oxygen       saturation, heart rate, blood pressure, respiratory rate, EKG, adequacy       of pulmonary ventilation, and response to care. Total physician       intraservice time was 23 minutes. Recommendation:           - Patient has a contact number available for                            emergencies. The signs and symptoms of potential                            delayed complications were discussed with the                            patient. Return to normal activities tomorrow.                            Written discharge instructions were provided to the  patient.                           - Resume previous diet.                           - Continue present medications.                           - Repeat colonoscopy date to be determined after                            pending pathology results are reviewed for                            surveillance based on pathology results.                           - Return to GI clinic in 3 months. Procedure Code(s):        --- Professional ---                           [phone], Colonoscopy, flexible; with removal of                            tumor(s), polyp(s), or other lesion(s) by snare                            technique Diagnosis Code(s):        --- Professional ---                           Z86.010, Personal history of colonic polyps                           K64.0, First degree hemorrhoids                           D12.2, Benign neoplasm of ascending colon                           K57.30, Diverticulosis of large intestine without                             perforation or abscess without bleeding CPT copyright 2016 [employer]. All rights reserved. The codes documented in this report are preliminary and upon coder review may  be revised to meet current compliance requirements. Cristopher Estimable. Treyana Sturgell, MD Norvel Richards, MD 01/19/2017 9:12:36 AM This report has been signed electronically. Number of Addenda: 0

## 2017-01-19 NOTE — Interval H&P Note (Signed)
History and Physical Interval Note:  01/19/2017 8:29 AM  Shawn [name]  has presented today for surgery, with the diagnosis of colon polyps  The various methods of treatment have been discussed with the patient and family. After consideration of risks, benefits and other options for treatment, the patient has consented to  Procedure(s) with comments: COLONOSCOPY WITH PROPOFOL (N/A) - 8:30am as a surgical intervention .  The patient's history has been reviewed, patient examined, no change in status, stable for surgery.  I have reviewed the patient's chart and labs.  Questions were answered to the patient's satisfaction.    No change; TCS per plan. The risks, benefits, limitations, alternatives and imponderables have been reviewed with the patient. Questions have been answered. All parties are agreeable.  Manus Rudd

## 2017-01-19 NOTE — Progress Notes (Signed)
Dr Gala Romney at bedside to talk with pt. Advised no endoscopy indicated at this time. Pt states dexilant effective and denies weight loss. Pt in agreement. Voiced understanding.

## 2017-01-21 ENCOUNTER — Encounter (HOSPITAL_COMMUNITY): Payer: Self-pay | Admitting: Internal Medicine

## 2017-01-21 ENCOUNTER — Encounter: Payer: Self-pay | Admitting: Internal Medicine

## 2017-02-06 ENCOUNTER — Emergency Department (HOSPITAL_COMMUNITY)
Admission: EM | Admit: 2017-02-06 | Discharge: 2017-02-06 | Disposition: A | Discharge status: Home / Self Care | Payer: Medicaid Other | Attending: Emergency Medicine | Admitting: Emergency Medicine

## 2017-02-06 ENCOUNTER — Encounter (HOSPITAL_COMMUNITY): Payer: Self-pay

## 2017-02-06 ENCOUNTER — Emergency Department (HOSPITAL_COMMUNITY): Payer: Medicaid Other

## 2017-02-06 DIAGNOSIS — J449 Chronic obstructive pulmonary disease, unspecified: Secondary | ICD-10-CM | POA: Insufficient documentation

## 2017-02-06 DIAGNOSIS — M5440 Lumbago with sciatica, unspecified side: Secondary | ICD-10-CM | POA: Insufficient documentation

## 2017-02-06 DIAGNOSIS — Y999 Unspecified external cause status: Secondary | ICD-10-CM | POA: Insufficient documentation

## 2017-02-06 DIAGNOSIS — X58XXXA Exposure to other specified factors, initial encounter: Secondary | ICD-10-CM | POA: Insufficient documentation

## 2017-02-06 DIAGNOSIS — T148XXA Other injury of unspecified body region, initial encounter: Secondary | ICD-10-CM

## 2017-02-06 DIAGNOSIS — M5441 Lumbago with sciatica, right side: Secondary | ICD-10-CM

## 2017-02-06 DIAGNOSIS — I10 Essential (primary) hypertension: Secondary | ICD-10-CM | POA: Insufficient documentation

## 2017-02-06 DIAGNOSIS — Y929 Unspecified place or not applicable: Secondary | ICD-10-CM | POA: Insufficient documentation

## 2017-02-06 DIAGNOSIS — Z79899 Other long term (current) drug therapy: Secondary | ICD-10-CM | POA: Diagnosis not present

## 2017-02-06 DIAGNOSIS — S39012A Strain of muscle, fascia and tendon of lower back, initial encounter: Secondary | ICD-10-CM | POA: Diagnosis not present

## 2017-02-06 DIAGNOSIS — F17290 Nicotine dependence, other tobacco product, uncomplicated: Secondary | ICD-10-CM | POA: Insufficient documentation

## 2017-02-06 DIAGNOSIS — Y939 Activity, unspecified: Secondary | ICD-10-CM | POA: Diagnosis not present

## 2017-02-06 DIAGNOSIS — M545 Low back pain: Secondary | ICD-10-CM | POA: Diagnosis present

## 2017-02-06 MED ORDER — IBUPROFEN 800 MG PO TABS: 800.0000 mg | ORAL_TABLET | Freq: Three times a day (TID) | ORAL | 0 refills | Status: DC

## 2017-02-06 MED ORDER — CYCLOBENZAPRINE HCL 10 MG PO TABS: 10.0000 mg | ORAL_TABLET | Freq: Three times a day (TID) | ORAL | 0 refills | Status: DC

## 2017-02-06 NOTE — ED Triage Notes (Signed)
Patient reports of chronic right lower back pain since 2005 but worse the past 5 days. Denies recent injury. States he has been taking medications but no relief.

## 2017-02-06 NOTE — ED Provider Notes (Signed)
Shawn [name] DEPT Provider Note   CSN: [id number] Arrival date & time: 02/06/17  1453  By signing my name below, I, Dolores Hoose, attest that this documentation has been prepared under the direction and in the presence of non-physician practitioner, Lily Kocher, PA-C. Electronically Signed: Dolores Hoose, Scribe. 02/06/2017. 3:12 PM.  History   Chief Complaint Chief Complaint  Patient presents with  . Back Pain   The history is provided by the patient. No language interpreter was used.   HPI Comments:  Shawn [name] is a 46 y.o. male who presents to the Emergency Department complaining of constant, worsening, mild back pain onset 4 days. Pt states that he has had chronic lower back pain for 13 years but his symptoms today are particularly severe. He describes his pain as beginning in his right lumbar region and radiating down his hip which is exacerbated with certain positions and movement. No associated symptoms indicated. Pt has tried his prescribed pain medication with no relief. Denies any loss of bowel or bladder control, gait problem, numbness or weakness.   Past Medical History:  Diagnosis Date  . Anemia   . Arthritis   . Back pain   . Bipolar 1 disorder ([place])   . Bipolar disorder ([place])   . 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 COPD (chronic obstructive pulmonary disease) ([place])   . Diverticulitis   . Family history of adverse reaction to anesthesia    he was adopted  . GERD (gastroesophageal reflux disease)   . Heart disease    "irregular heart beat"  . Heart murmur   . HOH (hard of hearing)    biological parents are both deaf.   left ear is good.  . Hyperlipidemia   . Hypertension   . 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 Lung nodules   . Schizoaffective disorder, bipolar type ([place])   . Vomiting     Patient Active Problem List   Diagnosis Date Noted  . Abnormal CT scan, colon 11/17/2016  . Hx of adenomatous colonic polyps  11/17/2016  . Hypotension 04/23/2016  . Fever 04/23/2016  . Cough 04/23/2016  . Tick bites 04/23/2016  . Hypokalemia 04/23/2016  . 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 Multiple lung nodules on CT 01/23/2016  . 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 Schizoaffective disorder ([place]) 12/22/2015  . Environmental allergies 12/22/2015  . Arrhythmia 12/22/2015  . Hyperlipidemia 12/22/2015  . Multiple lung nodules   . Chronic obstructive pulmonary disease ([place]) 11/17/2015  . Abdominal pain, periumbilical 82/99/3716  . Nausea with vomiting 07/03/2015  . Abnormal chest CT 07/03/2015  . Loose stools 10/30/2013  . Diverticulitis of colon (without mention of hemorrhage)(562.11) 10/01/2013  . GERD (gastroesophageal reflux disease) 10/01/2013    Past Surgical History:  Procedure Laterality Date  . BRAIN SURGERY  2005   after head injury, patient states had 2 large blood clots evacuated  . COLONOSCOPY WITH ESOPHAGOGASTRODUODENOSCOPY (EGD) N/A 12/05/2013   RCV:ELFY erosive relfux/HH/melanosis coli/colonic diverticulosis. tubulovillous adenoma removed. next tcs 11/2016  . COLONOSCOPY WITH PROPOFOL N/A 01/19/2017   Procedure: COLONOSCOPY WITH PROPOFOL;  Surgeon: Daneil Dolin, MD;  [facility];  Service: Endoscopy;  Laterality: N/A;  8:30am  . FLEXIBLE BRONCHOSCOPY N/A 01/23/2016   Procedure: FLEXIBLE BRONCHOSCOPY;  Surgeon: Gaye Pollack, MD;  Location: [facility] OR;  Service: Thoracic;  Laterality: N/A;  . LUNG BIOPSY N/A 01/23/2016   Procedure: LEFT LUNG BIOPSY;  Surgeon: Gaye Pollack, MD;  Location: [facility];  Service: Thoracic;  Laterality: N/A;  . POLYPECTOMY  01/19/2017   Procedure: POLYPECTOMY;  Surgeon: Daneil Dolin, MD;  Location: [facility];  Service: Endoscopy;;  ascending colon polyp  . VIDEO ASSISTED THORACOSCOPY Left 01/23/2016   Procedure: LEFT VIDEO ASSISTED THORACOSCOPY;  Surgeon: Gaye Pollack, MD;  Location: [facility] OR;  Service: Thoracic;  Laterality: Left;        Home Medications    Prior to Admission medications   Medication Sig Start Date End Date Taking? Authorizing Provider  cetirizine (ZYRTEC) 10 MG tablet Take 10 mg by mouth daily.    Historical Provider, MD  Cyanocobalamin (VITAMIN B-12) 5000 MCG TBDP Take 5,000 mcg by mouth daily.    Historical Provider, MD  cyclobenzaprine (FLEXERIL) 10 MG tablet Take 10 mg by mouth 3 (three) times daily as needed for muscle spasms.    Historical Provider, MD  dexlansoprazole (DEXILANT) 60 MG capsule Take 1 capsule (60 mg total) by mouth daily. 01/10/17   Soyla Dryer, PA-C  diclofenac (VOLTAREN) 75 MG EC tablet Take 75 mg by mouth 2 (two) times daily.     Historical Provider, MD  DULoxetine (CYMBALTA) 60 MG capsule Take 60 mg by mouth 2 (two) times daily. Reported on 05/03/2016    Historical Provider, MD  Elastic Bandages & Supports (LUMBAR BACK BRACE/SUPPORT PAD) MISC Wear as needed 07/01/16   Soyla Dryer, PA-C  FLUoxetine (PROZAC) 40 MG capsule Take 40 mg by mouth daily.    Historical Provider, MD  gabapentin (NEURONTIN) 300 MG capsule Take 300 mg by mouth 3 (three) times daily.     Historical Provider, MD  lamoTRIgine (LAMICTAL) 150 MG tablet Take 150 mg by mouth 2 (two) times daily.     Historical Provider, MD  lovastatin (MEVACOR) 20 MG tablet Take 20 mg by mouth at bedtime.     Historical Provider, MD  metoprolol tartrate (LOPRESSOR) 25 MG tablet Take 50 mg by mouth daily.     Historical Provider, MD  Multiple Vitamin (MULTIVITAMIN) tablet Take 1 tablet by mouth daily.    Historical Provider, MD  OLANZapine (ZYPREXA) 10 MG tablet Take 10 mg by mouth at bedtime. For mood swings and help sleep and 5-10mg  as needed for agitation/paranoia    Historical Provider, MD  ondansetron (ZOFRAN) 4 MG tablet TAKE ONE TABLET BY MOUTH EVERY 8 HOURS AS NEEDED FOR NAUSEA OR VOMITING 04/20/16   Carlis Stable, NP  oxyCODONE-acetaminophen (PERCOCET/ROXICET) 5-325 MG tablet Take 1-2 tablets by mouth every 4 (four)  hours as needed for severe pain. 11/22/16   Virgel Manifold, MD  Potassium 99 MG TABS Take 1 tablet by mouth every morning.    Historical Provider, MD  ranitidine (ZANTAC) 150 MG tablet TAKE 1 TABLET BY MOUTH ONCE DAILY BEFORE EVENING MEAL Patient taking differently: TAKE 1 TABLET BY MOUTH ONCE DAILY BEFORE BREAKFAST 11/05/16   Annitta Needs, NP  risperiDONE (RISPERDAL) 0.5 MG tablet Take 0.5 mg by mouth 2 (two) times daily.     Historical Provider, MD  trazodone (DESYREL) 300 MG tablet Take 300 mg by mouth at bedtime.    Historical Provider, MD    Family History Family History  Problem Relation Age of Onset  . Adopted: Yes  . Alzheimer's disease Other   . Parkinson's disease Other   . Mental illness Other   . Colon cancer Neg Hx     adopted at 52 months old, unsure about GI  history of parents     Social History Social History  Substance Use Topics  . Smoking status: Former Smoker    Packs/day: 0.75    Years: 25.00    Types: Cigars, Cigarettes    Quit date: 07/09/2014  . Smokeless tobacco: Current User    Types: Snuff     Comment: pt uses dip tobacco  . Alcohol use 0.0 oz/week     Comment: occ     Allergies   Patient has no known allergies.   Review of Systems Review of Systems  Constitutional: Negative for activity change.       All ROS Neg except as noted in HPI  HENT: Negative for nosebleeds.   Eyes: Negative for photophobia and discharge.  Respiratory: Negative for cough, shortness of breath and wheezing.   Cardiovascular: Negative for chest pain and palpitations.  Gastrointestinal: Negative for abdominal pain and blood in stool.  Genitourinary: Negative for dysuria, frequency and hematuria.       Negative for Bowel/Bladder Incontinence  Musculoskeletal: Positive for back pain. Negative for arthralgias, gait problem and neck pain.  Skin: Negative.   Neurological: Negative for dizziness, seizures, speech difficulty, weakness and numbness.  Psychiatric/Behavioral:  Negative for confusion and hallucinations.     Physical Exam Updated Vital Signs BP 126/78 (BP Location: Right Arm)   Pulse 77   Temp 97.7 F (36.5 C) (Oral)   Resp 16   Ht 5\' 11"  (1.803 m)   Wt 175 lb (79.4 kg)   SpO2 99%   BMI 24.41 kg/m   Physical Exam  Constitutional: He is oriented to person, place, and time. He appears well-developed and well-nourished. No distress.  HENT:  Head: Normocephalic and atraumatic.  Eyes: Conjunctivae are normal.  Cardiovascular: Normal rate, regular rhythm and normal heart sounds.   Pulmonary/Chest: Effort normal and breath sounds normal.  Abdominal: He exhibits no distension.  Musculoskeletal:  Pain of right lower lumbar area with ROM. Straight leg raise pain at 40 degrees.   Neurological: He is alert and oriented to person, place, and time.  Skin: Skin is warm and dry.  Psychiatric: He has a normal mood and affect.  Nursing note and vitals reviewed.    ED Treatments / Results  DIAGNOSTIC STUDIES:  Oxygen Saturation is 99% on RA, normal by my interpretation.    COORDINATION OF CARE:  4:05 PM Discussed treatment plan with pt at bedside which includes imaging and pt agreed to plan.  Labs (all labs ordered are listed, but only abnormal results are displayed) Labs Reviewed - No data to display  EKG  EKG Interpretation None       Radiology No results found.  Procedures Procedures (including critical care time)  Medications Ordered in ED Medications - No data to display   Initial Impression / Assessment and Plan / ED Course  I have reviewed the triage vital signs and the nursing notes.  Pertinent labs & imaging results that were available during my care of the patient were reviewed by me and considered in my medical decision making (see chart for details).     *I have reviewed nursing notes, vital signs, and all appropriate lab and imaging results for this patient.**  Final Clinical Impressions(s) / ED  Diagnoses MDM Vital signs within normal limits. No gross neurologic deficits appreciated. The x-ray of the lumbar spine is negative for acute problem. I've asked the patient to use a heating pad to the hip and back area. Prescription for Flexeril and ibuprofen  given to the patient. The patient is to follow-up with Dr. Aline Brochure for orthopedic evaluation concerning his pain as soon as possible.    Final diagnoses:  Muscle strain  Right-sided low back pain with sciatica, sciatica laterality unspecified, unspecified chronicity    New Prescriptions New Prescriptions   No medications on file  **I personally performed the services described in this documentation, which was scribed in my presence. The recorded information has been reviewed and is accurate.Lily Kocher, PA-C 02/06/17 [place], MD 02/07/17 1016

## 2017-02-06 NOTE — Discharge Instructions (Signed)
Your vital signs within normal limits. Pulse oximetry is 99% on room air. Within normal limits by my interpretation. The x-ray of your lumbar spine is negative for any fracture or dislocation, or loss of disc space. Please use Flexeril and ibuprofen 3 times daily for the pain and spasm. Heating pad to your back and hip maybe helpful. Please see Dr. Aline Brochure for additional evaluation and management if not improving.

## 2017-03-07 IMAGING — CR DG CHEST 1V PORT
1 series · 1 of 1 positions shown · non-contrast
Comparison: Chest radiograph from one day prior.

CLINICAL DATA: Status post lung surgery

EXAM:
PORTABLE CHEST 1 VIEW

[AP]
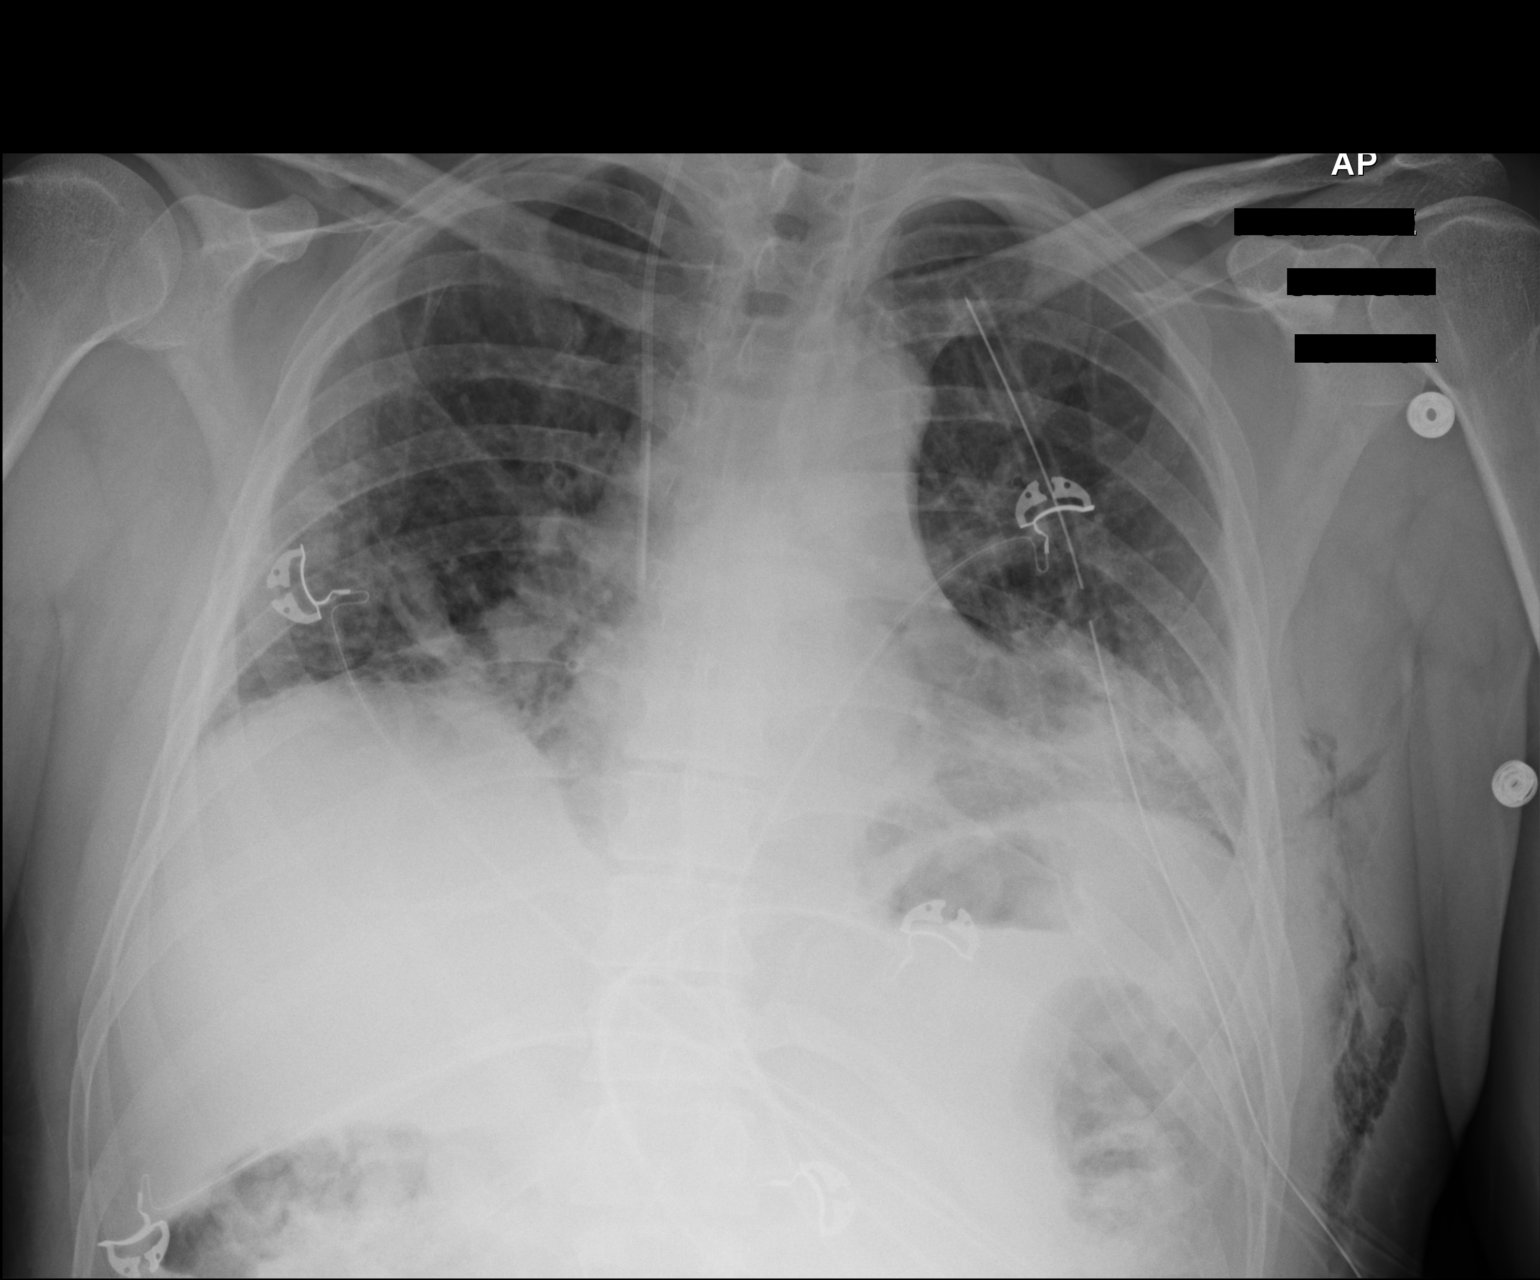

[1 of 1 positions shown; findings below may reference images not displayed]

FINDINGS: Right internal jugular central venous catheter terminates in the
lower third of the superior vena cava. Left chest tube is stable in
position. Stable subcutaneous emphysema in the lateral lower left
chest wall. Stable cardiomediastinal silhouette with normal heart
size. No pneumothorax. No pleural effusion. Moderate bibasilar
patchy lung opacities, favor atelectasis, unchanged. No pulmonary
edema.
IMPRESSION: 1. No pneumothorax.
2. Stable moderate bibasilar patchy lung opacities, favor
atelectasis.

## 2017-03-08 IMAGING — CR DG CHEST 1V
1 series · 1 of 1 positions shown · non-contrast
Comparison: 01/25/2016

CLINICAL DATA: Chest tube removal

EXAM:
CHEST 1 VIEW

[AP]
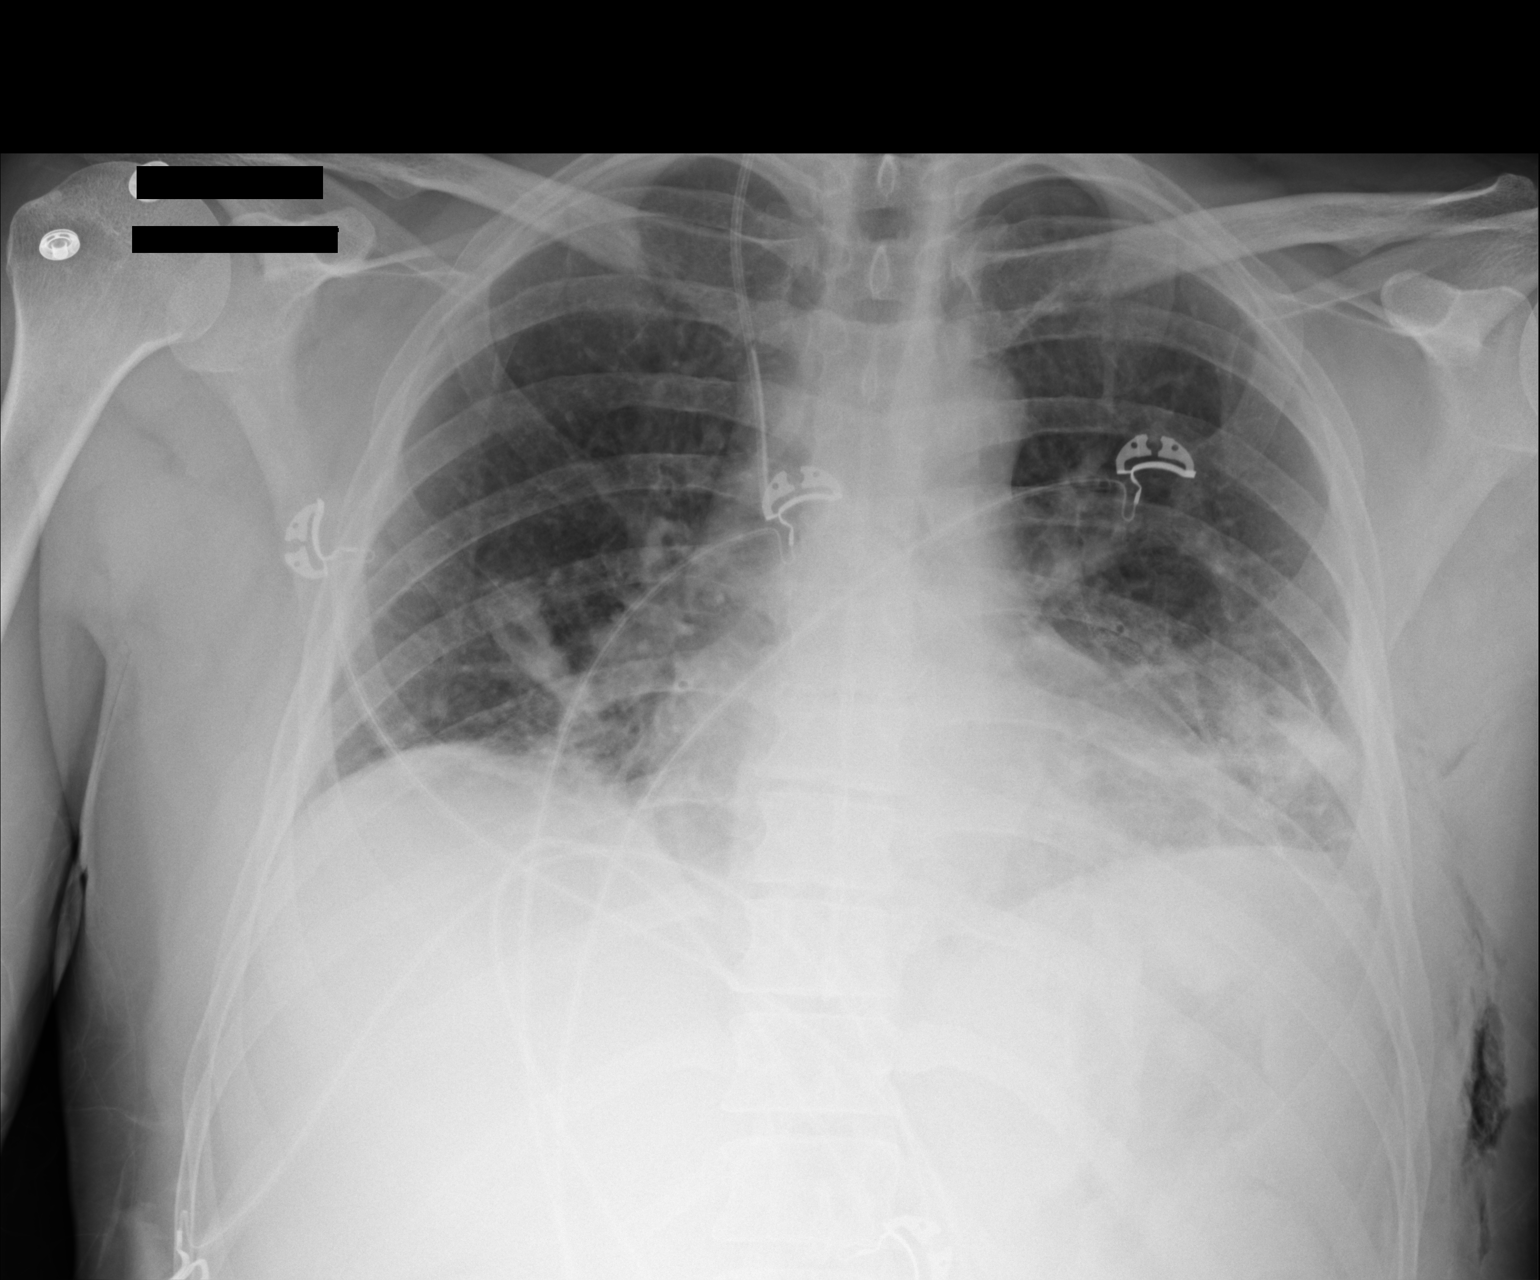

[1 of 1 positions shown; findings below may reference images not displayed]

FINDINGS: Left chest tube has been removed. Tiny less than 5% left apical
pneumothorax has developed. Bibasilar pulmonary opacities are
stable. Right jugular venous catheter is stable. Emphysema over the
lateral and lower left chest wall is stable.
IMPRESSION: Left chest tube removed.  Tiny ensuing left apical pneumothorax

Bibasilar pulmonary opacities are stable.

## 2017-03-08 IMAGING — CR DG CHEST 1V PORT
1 series · 1 of 1 positions shown · non-contrast
Comparison: Single view of the chest 01/25/2016 and 01/24/2016.

CLINICAL DATA: Status post left lung biopsy and VATS 01/23/2016.
Initial encounter.

EXAM:
PORTABLE CHEST 1 VIEW

[AP]
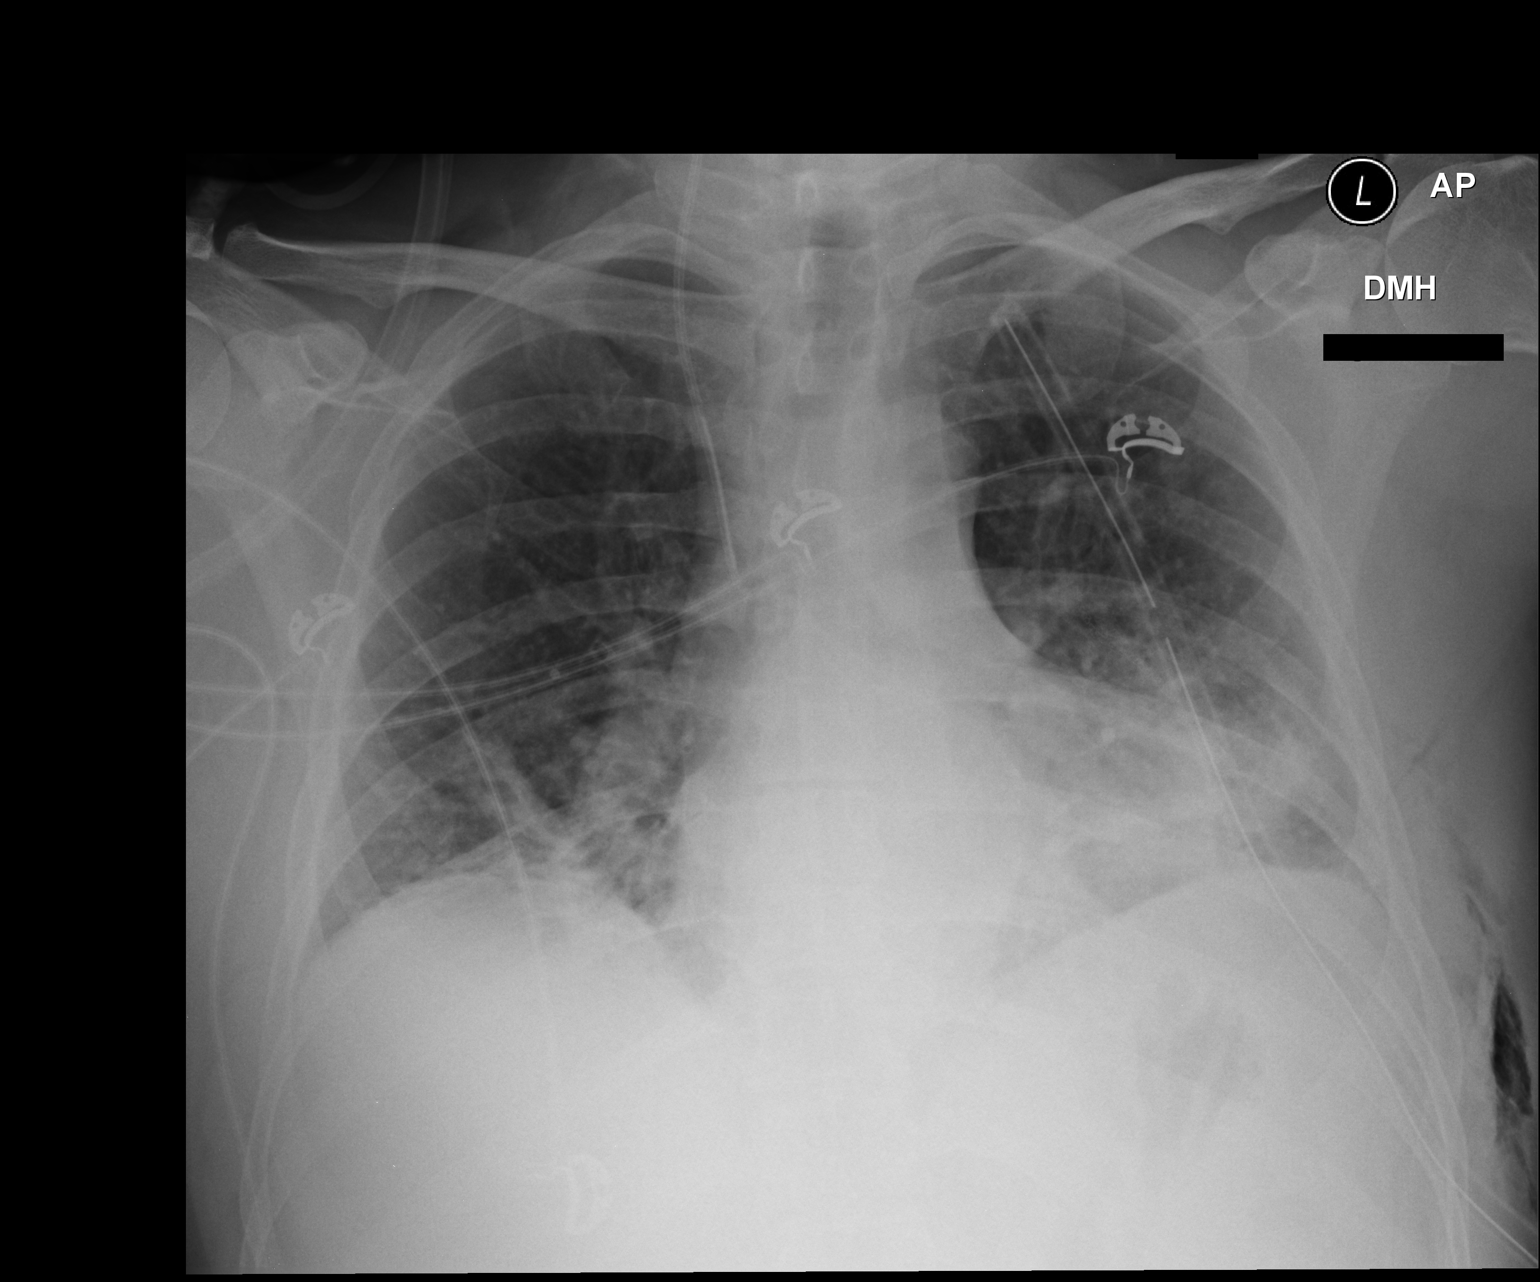

[1 of 1 positions shown; findings below may reference images not displayed]

FINDINGS: Right IJ catheter and left chest tube remain in place. No
pneumothorax is identified. Bibasilar airspace disease is worse on
the left and not markedly changed since yesterday's study. Heart
size is normal. No pleural effusion is identified.
IMPRESSION: Negative for pneumothorax with a left chest tube in place.

No marked change in bibasilar airspace disease, worse on the left.

## 2017-03-09 IMAGING — CR DG CHEST 2V
2 series · 2 of 2 positions shown · non-contrast
Comparison: Chest x-rays dated 01/26/2016, 01/24/2016 and
01/07/2016.

CLINICAL DATA: Recent left lung biopsy. History of left lung biopsy
and VATS 01/23/2016.

EXAM:
CHEST  2 VIEW

[chest pa]
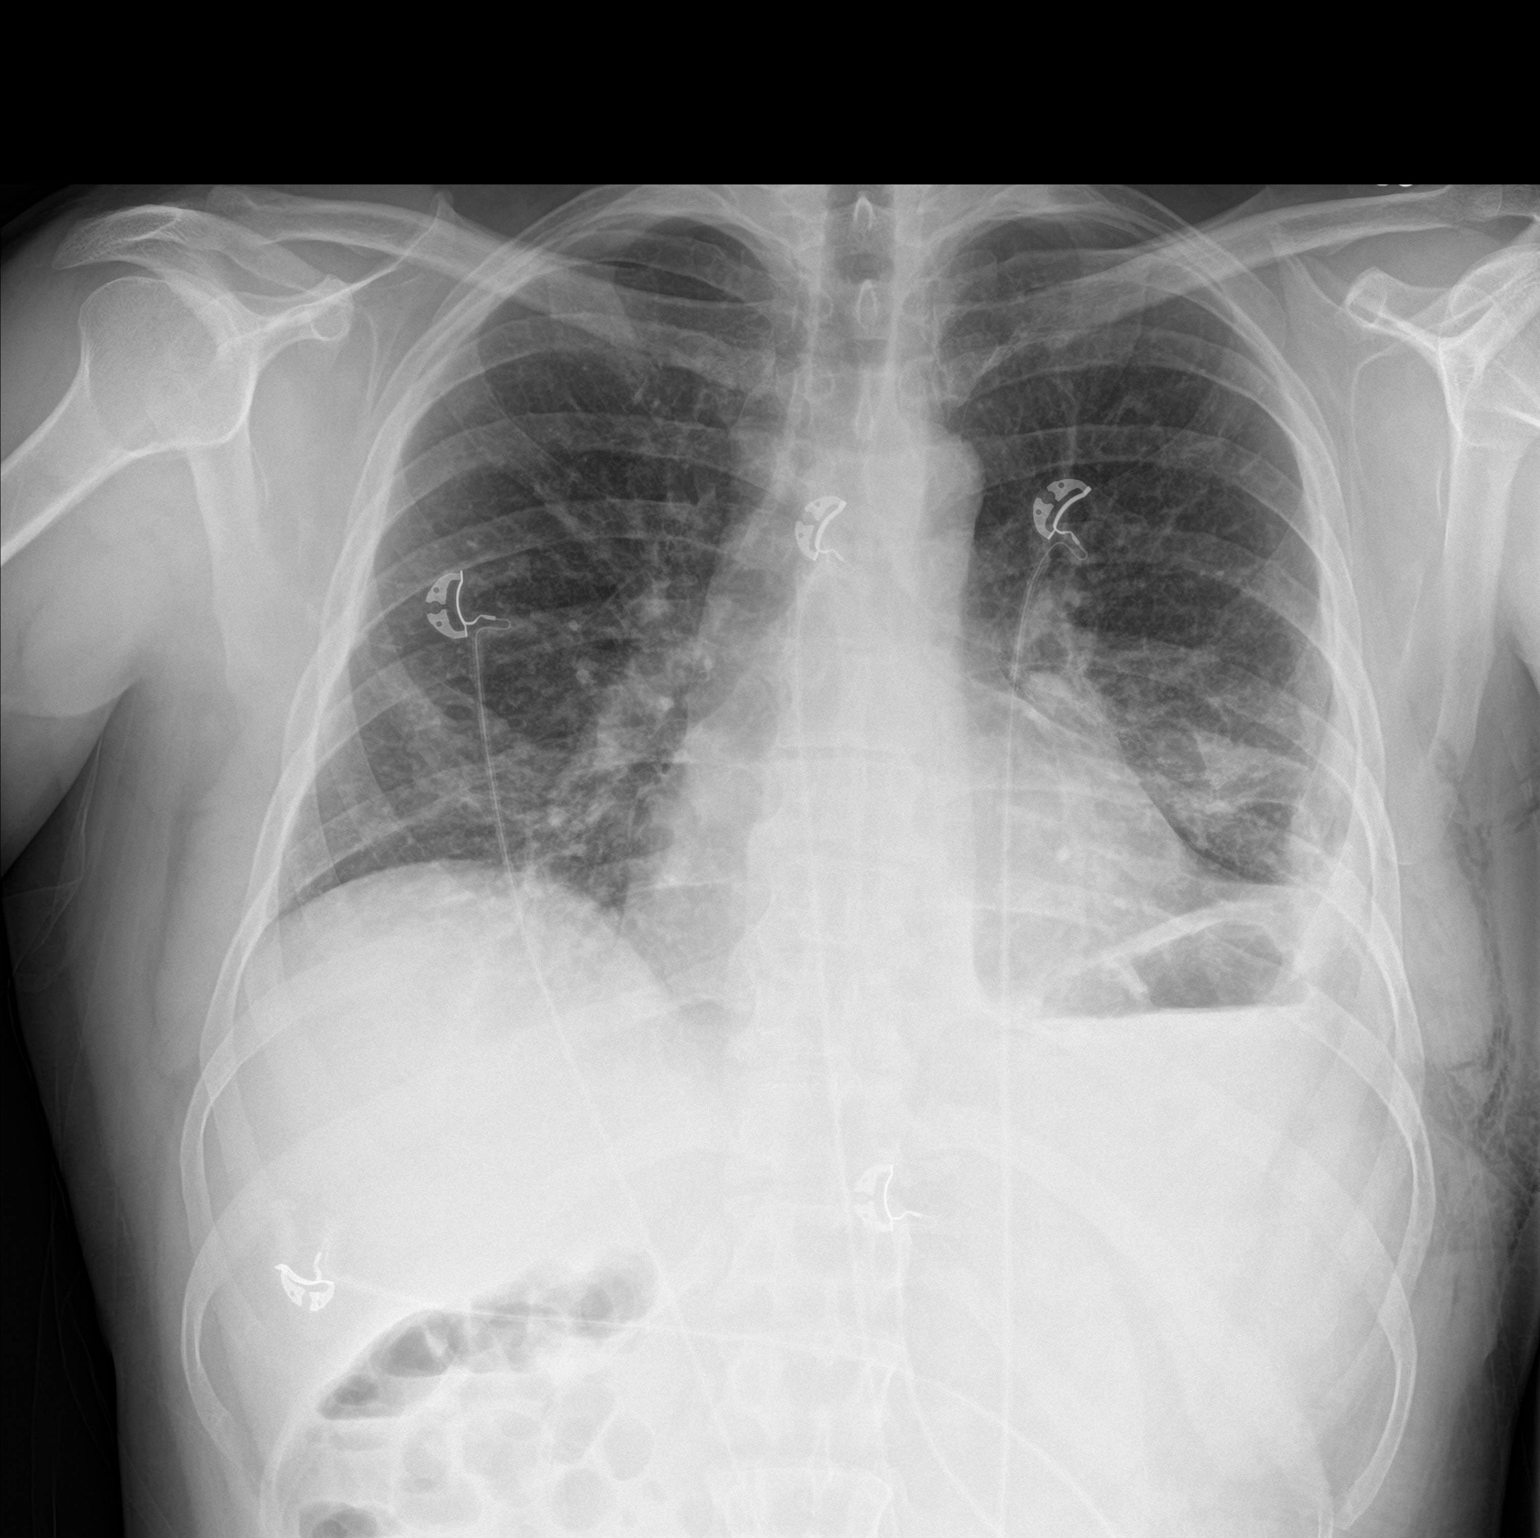

[chest lat]
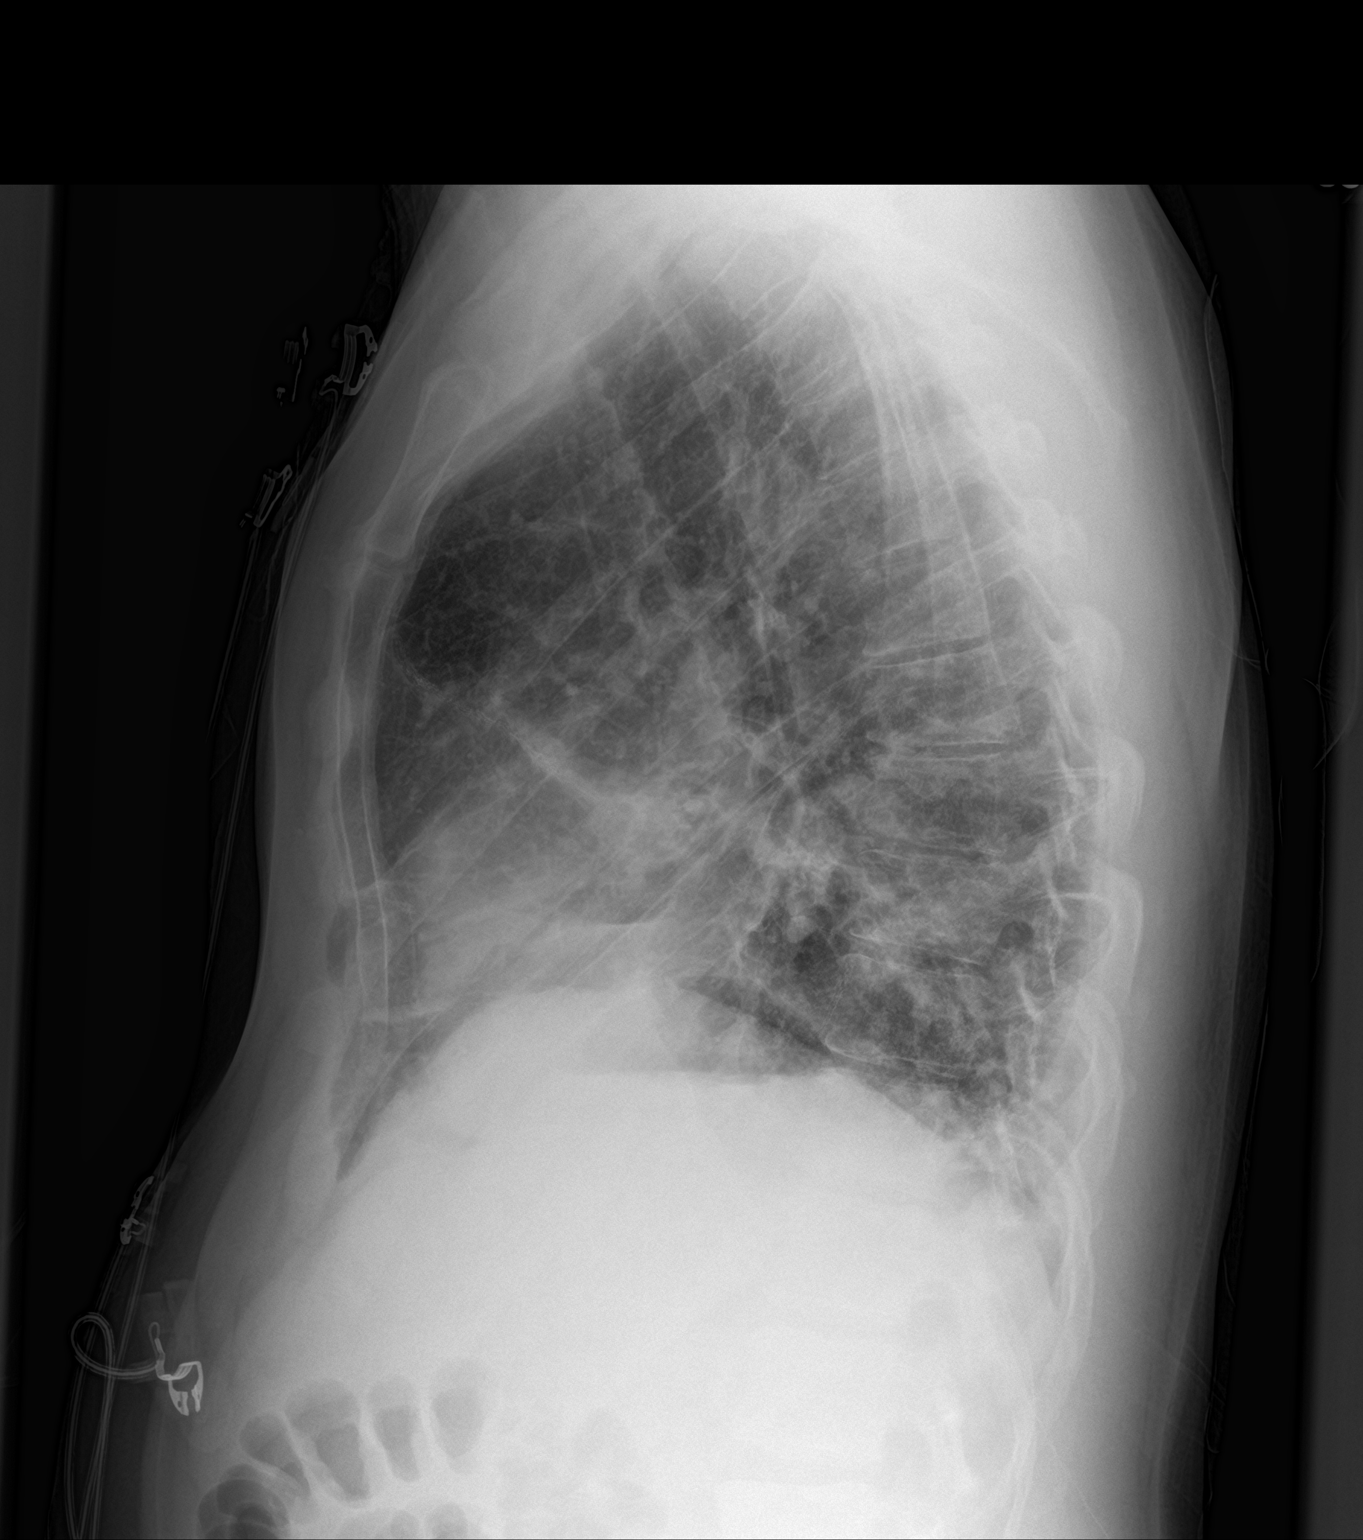

[2 of 2 positions shown; findings below may reference images not displayed]

FINDINGS: Left-sided chest tube has been removed. Right IJ central line has
also been removed in the interval.

There is improved aeration at the each lung base, most likely
resolving atelectasis or edema. No pneumothorax appreciated.
Persistent subcutaneous emphysema noted along the left lateral chest
wall. Cardiomediastinal silhouette appears stable in size and
configuration. Mild degenerative spurring noted within the thoracic
spine
IMPRESSION: 1. Status post left-sided chest tube removal. No residual
pneumothorax seen.
2. Persistent airspace opacities at each lung base, but decreased
compared to the previous study suggesting resolving atelectasis or
edema. No new lung abnormality.

## 2017-03-29 ENCOUNTER — Encounter (HOSPITAL_COMMUNITY): Payer: Medicaid Other | Admitting: Physical Therapy

## 2017-03-29 ENCOUNTER — Encounter (HOSPITAL_COMMUNITY): Payer: Self-pay | Admitting: Physical Therapy

## 2017-03-29 ENCOUNTER — Ambulatory Visit: Payer: Medicaid Other | Admitting: Physician Assistant

## 2017-03-29 ENCOUNTER — Encounter: Payer: Self-pay | Admitting: Physician Assistant

## 2017-03-29 ENCOUNTER — Ambulatory Visit (HOSPITAL_COMMUNITY): Payer: Medicaid Other | Attending: Internal Medicine | Admitting: Physical Therapy

## 2017-03-29 VITALS — BP 110/68 | HR 91 | Temp 98.2°F | Ht 71.0 in | Wt 174.5 lb

## 2017-03-29 DIAGNOSIS — R29898 Other symptoms and signs involving the musculoskeletal system: Secondary | ICD-10-CM | POA: Diagnosis present

## 2017-03-29 DIAGNOSIS — M5442 Lumbago with sciatica, left side: Secondary | ICD-10-CM | POA: Diagnosis present

## 2017-03-29 DIAGNOSIS — M6281 Muscle weakness (generalized): Secondary | ICD-10-CM | POA: Insufficient documentation

## 2017-03-29 DIAGNOSIS — G8929 Other chronic pain: Secondary | ICD-10-CM | POA: Diagnosis present

## 2017-03-29 DIAGNOSIS — K219 Gastro-esophageal reflux disease without esophagitis: Secondary | ICD-10-CM

## 2017-03-29 DIAGNOSIS — F39 Unspecified mood [affective] disorder: Secondary | ICD-10-CM

## 2017-03-29 DIAGNOSIS — M5441 Lumbago with sciatica, right side: Secondary | ICD-10-CM | POA: Insufficient documentation

## 2017-03-29 DIAGNOSIS — R293 Abnormal posture: Secondary | ICD-10-CM | POA: Insufficient documentation

## 2017-03-29 DIAGNOSIS — J984 Other disorders of lung: Secondary | ICD-10-CM

## 2017-03-29 DIAGNOSIS — I10 Essential (primary) hypertension: Secondary | ICD-10-CM

## 2017-03-29 DIAGNOSIS — R0600 Dyspnea, unspecified: Secondary | ICD-10-CM

## 2017-03-29 NOTE — Patient Instructions (Signed)
   SEATED HAMSTRING STRETCH  While seated, rest your heel on the floor with your knee straight and gently lean forward until a stretch is felt behind your knee/thigh.  Hold for 30 seconds.  Repeat 2 times each leg, twice a day.    PIRIFORMIS AND HIP STRETCH - SEATED (DO NOT HUNCH LIKE THE FELLOW IN THE PICTURE- SIT UP STRAIGHT)  While sitting in a chair, cross your affected leg on top of the other as shown.   Hold for 30 seconds and relax.  Repeat 2 times each leg, twice a day.    SINGLE KNEE TO CHEST STRETCH - [place]  While Lying on your back, hold your knee and gently pull it up towards your chest.  Hold for 5 seconds.  Repeat 3-5 times each side, twice a day.    Transverse Abdominus Activation  Lying on your back, pull your bellybutton into your spine.   Hold for 2 seconds.  Repeat 10 times, at least 5 times per day.

## 2017-03-29 NOTE — Therapy (Signed)
[facility] [address] [place], [place], [address] Phone: [phone]   Fax:  [phone]  Physical Therapy Evaluation  Patient Details  Name: Shawn [name] MRN: [id number] Date of Birth: 1971/10/01 Referring Provider: Artis Delay  Encounter Date: 03/29/2017      PT End of Session - 03/29/17 1621    Visit Number 1   Number of Visits 1   Date for PT Re-Evaluation 03/29/17   Authorization Type Medicaid    Authorization Time Period 03/29/17 to 03/29/17   PT Start Time 1435   PT Stop Time 1505   PT Time Calculation (min) 30 min   Activity Tolerance Patient tolerated treatment well   Behavior During Therapy [facility] for tasks assessed/performed      Past Medical History:  Diagnosis Date  . Anemia   . Arthritis   . Back pain   . Bipolar 1 disorder ([place])   . Bipolar disorder ([place])   . 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 COPD (chronic obstructive pulmonary disease) ([place])   . Diverticulitis   . Family history of adverse reaction to anesthesia    he was adopted  . GERD (gastroesophageal reflux disease)   . Heart disease    "irregular heart beat"  . Heart murmur   . HOH (hard of hearing)    biological parents are both deaf.   left ear is good.  . Hyperlipidemia   . Hypertension   . 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 Lung nodules   . Schizoaffective disorder, bipolar type ([place])   . Vomiting     Past Surgical History:  Procedure Laterality Date  . BRAIN SURGERY  2005   after head injury, patient states had 2 large blood clots evacuated  . COLONOSCOPY WITH ESOPHAGOGASTRODUODENOSCOPY (EGD) N/A 12/05/2013   FXT:KWIO erosive relfux/HH/melanosis coli/colonic diverticulosis. tubulovillous adenoma removed. next tcs 11/2016  . COLONOSCOPY WITH PROPOFOL N/A 01/19/2017   Procedure: COLONOSCOPY WITH PROPOFOL;  Surgeon: Daneil Dolin, MD;  [facility];  Service: Endoscopy;  Laterality: N/A;   8:30am  . FLEXIBLE BRONCHOSCOPY N/A 01/23/2016   Procedure: FLEXIBLE BRONCHOSCOPY;  Surgeon: Gaye Pollack, MD;  Location: [facility];  Service: Thoracic;  Laterality: N/A;  . LUNG BIOPSY N/A 01/23/2016   Procedure: LEFT LUNG BIOPSY;  Surgeon: Gaye Pollack, MD;  Location: [place];  Service: Thoracic;  Laterality: N/A;  . POLYPECTOMY  01/19/2017   Procedure: POLYPECTOMY;  Surgeon: Daneil Dolin, MD;  [facility];  Service: Endoscopy;;  ascending colon polyp  . VIDEO ASSISTED THORACOSCOPY Left 01/23/2016   Procedure: LEFT VIDEO ASSISTED THORACOSCOPY;  Surgeon: Gaye Pollack, MD;  Location: [facility];  Service: Thoracic;  Laterality: Left;    There were no vitals filed for this visit.       Subjective Assessment - 03/29/17 1437    Subjective Patient arrives stating that his neck, back, and hip have been hurting him since 2005 when he had a head injury; it is hard for him to stand very long in one spot, and sometimes even just sitting hurts. Walking is hard however he does have a lung disease ilmiting this. He is OK after walking several miles but after this it can take him awhile to get going again. No numbness or tingling. Unsure if he has had falls or clsoe calls. He reports his head injury came from hitting head first falling  down some steps.    Pertinent History COPD, lung lesions/cysts, schizoaffective disorder, IgG4-sclerosing disease, bipolar, HOH, hx head injury 2005   Patient Stated Goals get neck and back better    Currently in Pain? Yes   Pain Score 6    Pain Location Other (Comment)  neck and back    Pain Orientation Right;Left   Pain Descriptors / Indicators Shooting;Stabbing   Pain Type Chronic pain   Pain Radiating Towards neck radiates into head, back occasionally can radiate into legs    Pain Onset More than a month ago   Pain Frequency Constant   Aggravating Factors  certain types of movement    Pain Relieving Factors nothing really    Effect of Pain on Daily  Activities severe impact             [facility] PT Assessment - 03/29/17 0001      Assessment   Medical Diagnosis back and hip pain    Referring Provider Ankoor Manuella Ghazi   Onset Date/Surgical Date --  chronic    Next MD Visit Dr. Manuella Ghazi in September    Prior Therapy none      Balance Screen   Has the patient fallen in the past 6 months Yes   How many times? just a couple    Has the patient had a decrease in activity level because of a fear of falling?  No   Is the patient reluctant to leave their home because of a fear of falling?  No     Prior Function   Level of Independence Independent;Independent with basic ADLs;Independent with gait;Independent with transfers   Vocation Other (comment)  working on getting disability      AROM   Lumbar Flexion WFL    Lumbar Extension WFL    Lumbar - Right Side Bend WFL    Lumbar - Left Side Bend [facility]      Strength   Right Hip Flexion 5/5   Right Hip Extension 4/5   Right Hip ABduction 4+/5   Left Hip Flexion 5/5   Left Hip Extension 4/5   Left Hip ABduction 4+/5   Right Knee Extension 5/5   Left Knee Extension 5/5   Right Ankle Dorsiflexion 5/5   Left Ankle Dorsiflexion 5/5     Flexibility   Hamstrings mild limitation B    Piriformis moderate limitation L, severe limitation R                            PT Education - 03/29/17 1620    Education provided Yes   Education Details prognosis, examination findings, POC, HEP, Medicaid insurance limitations; strong recommendation for pro bono clinics for individualized PT care moving forward    Person(s) Educated Patient   Methods Explanation;Demonstration;Handout;Verbal cues   Comprehension Verbalized understanding;Returned demonstration;Verbal cues required;Need further instruction          PT Short Term Goals - 03/29/17 1624      PT SHORT TERM GOAL #1   Title Patient to be educated on appropriate exercises to assist in managing pain as well as local pro bono  clinics for futher care moving forward    Baseline 1 time visit- Medicaid limitations    Time 1   Period Days   Status Achieved           PT Long Term Goals - 03/29/17 1625      PT LONG TERM GOAL #1   Title  No long term goals appropriate- 1 time visit only due to insurance limitations                Plan - 03/29/17 1622    Clinical Impression Statement Patient arrives with chronic back and hip pain, which MD referral indicates is due to arthritis; patient reports that his neck is also painful however upon review MD does appear to have included this in PT order. Patient reports that his pain has been present ever since he sustained a head injury falling down some stairs in 2005. Examination reveals extremely poor posture with poor tolerance to postural corrections likely due to significant limitations in core strength, as well as reduced ability to perform functional tasks secondary to pain. Presentation appears consistent with MD indication of arthritis however patient is very reluctant to participate in exercise and activity in general due to discomfort, so examination and HEP was limited in extent today. Strongly recommended to patient that he attend pro bono clinic for ongoing individualized attention with skilled PT services moving forward. One time visit only due to insurance limitations.    Rehab Potential Poor   Clinical Impairments Affecting Rehab Potential (+) appears motivated to participate in PT; (-) hx head injury, reluctance to participate in exercise/activity due to pain, insurance limitations of only 1 visit    PT Frequency One time visit   PT Treatment/Interventions ADLs/Self Care Home Management   PT Next Visit Plan one time visit only    PT Home Exercise Plan Eval: HS stretch, piriformis stretch, TA activation, SKTC    Recommended Other Services pro bono PT clinics    Consulted and Agree with Plan of Care Patient      Patient will benefit from skilled  therapeutic intervention in order to improve the following deficits and impairments:  Improper body mechanics, Pain, Decreased coordination, Decreased mobility, Increased muscle spasms, Postural dysfunction, Decreased activity tolerance, Decreased strength, Difficulty walking, Impaired flexibility  Visit Diagnosis: Chronic midline low back pain with bilateral sciatica - Plan: PT plan of care cert/re-cert  Abnormal posture - Plan: PT plan of care cert/re-cert  Muscle weakness (generalized) - Plan: PT plan of care cert/re-cert  Other symptoms and signs involving the musculoskeletal system - Plan: PT plan of care cert/re-cert     Problem List Patient Active Problem List   Diagnosis Date Noted  . Abnormal CT scan, colon 11/17/2016  . Hx of adenomatous colonic polyps 11/17/2016  . Hypotension 04/23/2016  . Fever 04/23/2016  . Cough 04/23/2016  . Tick bites 04/23/2016  . Hypokalemia 04/23/2016  . 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 Multiple lung nodules on CT 01/23/2016  . 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 Schizoaffective disorder ([place]) 12/22/2015  . Environmental allergies 12/22/2015  . Arrhythmia 12/22/2015  . Hyperlipidemia 12/22/2015  . Multiple lung nodules   . Chronic obstructive pulmonary disease ([place]) 11/17/2015  . Abdominal pain, periumbilical 16/08/9603  . Nausea with vomiting 07/03/2015  . Abnormal chest CT 07/03/2015  . Loose stools 10/30/2013  . Diverticulitis of colon (without mention of hemorrhage)(562.11) 10/01/2013  . GERD (gastroesophageal reflux disease) 10/01/2013   Deniece Ree PT, DPT [facility] [address] [place], [place], [address] Phone: [phone]   Fax:  [phone]  Name: Shawn [name] MRN: [id number] Date of Birth: [birth year]

## 2017-03-29 NOTE — Progress Notes (Signed)
BP 110/68 (BP Location: Left Arm, Patient Position: Sitting, Cuff Size: Normal)   Pulse 91   Temp 98.2 F (36.8 C) (Other (Comment))   Ht 5\' 11"  (1.803 m)   Wt 174 lb 8 oz (79.2 kg)   SpO2 97%   BMI 24.34 kg/m    Subjective:    Patient ID: Shawn [name], male    DOB: [birth year], 46 y.o.   MRN: [id number]  HPI: Shawn [name] is a 46 y.o. male presenting on 03/29/2017 for Follow-up   HPI   Pt is still going to pulmonology.   Pt is still seeing Rheumatology at [facility] He is still going to [facility]  He is doing physical therapy for his back  His stomach is feeling well.    Relevant past medical, surgical, family and social history reviewed and updated as indicated. Interim medical history since our last visit reviewed. Allergies and medications reviewed and updated.   Current Outpatient Prescriptions:  .  azaTHIOprine (IMURAN) 50 MG tablet, Take 100 mg by mouth daily., Disp: , Rfl:  .  cetirizine (ZYRTEC) 10 MG tablet, Take 10 mg by mouth daily., Disp: , Rfl:  .  Cyanocobalamin (VITAMIN B-12) 5000 MCG TBDP, Take 5,000 mcg by mouth daily., Disp: , Rfl:  .  cyclobenzaprine (FLEXERIL) 10 MG tablet, Take 1 tablet (10 mg total) by mouth 3 (three) times daily., Disp: 20 tablet, Rfl: 0 .  dexlansoprazole (DEXILANT) 60 MG capsule, Take 1 capsule (60 mg total) by mouth daily., Disp: 30 capsule, Rfl: 5 .  diclofenac (VOLTAREN) 75 MG EC tablet, Take 75 mg by mouth 2 (two) times daily. , Disp: , Rfl:  .  DULoxetine (CYMBALTA) 60 MG capsule, Take 60 mg by mouth 2 (two) times daily. Reported on 05/03/2016, Disp: , Rfl:  .  Elastic Bandages & Supports (LUMBAR BACK BRACE/SUPPORT PAD) MISC, Wear as needed, Disp: 1 each, Rfl: 0 .  FLUoxetine (PROZAC) 40 MG capsule, Take 40 mg by mouth daily., Disp: , Rfl:  .  gabapentin (NEURONTIN) 300 MG capsule, Take 300 mg by mouth 3 (three) times daily. , Disp: , Rfl:  .  ibuprofen (ADVIL,MOTRIN) 800 MG tablet, Take 1 tablet (800 mg total) by  mouth 3 (three) times daily., Disp: 21 tablet, Rfl: 0 .  lamoTRIgine (LAMICTAL) 150 MG tablet, Take 150 mg by mouth 2 (two) times daily. , Disp: , Rfl:  .  lovastatin (MEVACOR) 20 MG tablet, Take 20 mg by mouth at bedtime. , Disp: , Rfl:  .  metoprolol tartrate (LOPRESSOR) 25 MG tablet, Take 50 mg by mouth daily. , Disp: , Rfl:  .  Multiple Vitamin (MULTIVITAMIN) tablet, Take 1 tablet by mouth daily., Disp: , Rfl:  .  OLANZapine (ZYPREXA) 10 MG tablet, Take 10 mg by mouth at bedtime. For mood swings and help sleep and 5-10mg  as needed for agitation/paranoia, Disp: , Rfl:  .  ondansetron (ZOFRAN) 4 MG tablet, TAKE ONE TABLET BY MOUTH EVERY 8 HOURS AS NEEDED FOR NAUSEA OR VOMITING, Disp: 30 tablet, Rfl: 1 .  oxyCODONE-acetaminophen (PERCOCET/ROXICET) 5-325 MG tablet, Take 1-2 tablets by mouth every 4 (four) hours as needed for severe pain., Disp: 12 tablet, Rfl: 0 .  Potassium 99 MG TABS, Take 1 tablet by mouth every morning., Disp: , Rfl:  .  predniSONE (STERAPRED UNI-PAK 21 TAB) 10 MG (21) TBPK tablet, Take 30 mg by mouth daily., Disp: , Rfl:  .  ranitidine (ZANTAC) 150 MG tablet, TAKE 1 TABLET BY MOUTH  ONCE DAILY BEFORE EVENING MEAL (Patient taking differently: TAKE 1 TABLET BY MOUTH ONCE DAILY BEFORE BREAKFAST), Disp: 30 tablet, Rfl: 5 .  risperiDONE (RISPERDAL) 0.5 MG tablet, Take 0.5 mg by mouth at bedtime. , Disp: , Rfl:  .  trazodone (DESYREL) 300 MG tablet, Take 300 mg by mouth at bedtime., Disp: , Rfl:    Review of Systems  Constitutional: Positive for fatigue. Negative for appetite change, chills, diaphoresis, fever and unexpected weight change.  HENT: Positive for hearing loss. Negative for congestion, dental problem, drooling, ear pain, facial swelling, mouth sores, sneezing, sore throat, trouble swallowing and voice change.   Eyes: Negative for pain, discharge, redness, itching and visual disturbance.  Respiratory: Positive for cough and shortness of breath. Negative for choking and  wheezing.   Cardiovascular: Negative for chest pain, palpitations and leg swelling.  Gastrointestinal: Negative for abdominal pain, blood in stool, constipation, diarrhea and vomiting.  Endocrine: Negative for cold intolerance, heat intolerance and polydipsia.  Genitourinary: Negative for decreased urine volume, dysuria and hematuria.  Musculoskeletal: Positive for arthralgias and back pain. Negative for gait problem.  Skin: Negative for rash.  Allergic/Immunologic: Negative for environmental allergies.  Neurological: Negative for seizures, syncope, light-headedness and headaches.  Hematological: Negative for adenopathy.  Psychiatric/Behavioral: Positive for agitation and dysphoric mood. Negative for suicidal ideas. The patient is nervous/anxious.     Per HPI unless specifically indicated above     Objective:    BP 110/68 (BP Location: Left Arm, Patient Position: Sitting, Cuff Size: Normal)   Pulse 91   Temp 98.2 F (36.8 C) (Other (Comment))   Ht 5\' 11"  (1.803 m)   Wt 174 lb 8 oz (79.2 kg)   SpO2 97%   BMI 24.34 kg/m   Wt Readings from Last 3 Encounters:  03/29/17 174 lb 8 oz (79.2 kg)  02/06/17 175 lb (79.4 kg)  01/14/17 186 lb 9.6 oz (84.6 kg)    Physical Exam  Constitutional: He is oriented to person, place, and time. He appears well-developed and well-nourished.  HENT:  Head: Normocephalic and atraumatic.  Neck: Neck supple.  Cardiovascular: Normal rate and regular rhythm.   Pulmonary/Chest: Effort normal and breath sounds normal. He has no wheezes.  Abdominal: Soft. Bowel sounds are normal. There is no hepatosplenomegaly. There is no tenderness.  Musculoskeletal: He exhibits no edema.  Lymphadenopathy:    He has no cervical adenopathy.  Neurological: He is alert and oriented to person, place, and time.  Skin: Skin is warm and dry.  Psychiatric: He has a normal mood and affect. His behavior is normal.  Vitals reviewed.   Results for orders placed or performed  during the hospital encounter of 01/14/17  CBC with Differential/Platelet  Result Value Ref Range   WBC 8.2 4.0 - 10.5 K/uL   RBC 4.71 4.22 - 5.81 MIL/uL   Hemoglobin 14.9 13.0 - 17.0 g/dL   HCT 42.6 39.0 - 52.0 %   MCV 90.4 78.0 - 100.0 fL   MCH 31.6 26.0 - 34.0 pg   MCHC 35.0 30.0 - 36.0 g/dL   RDW 13.1 11.5 - 15.5 %   Platelets 244 150 - 400 K/uL   Neutrophils Relative % 62 %   Neutro Abs 5.2 1.7 - 7.7 K/uL   Lymphocytes Relative 29 %   Lymphs Abs 2.4 0.7 - 4.0 K/uL   Monocytes Relative 6 %   Monocytes Absolute 0.5 0.1 - 1.0 K/uL   Eosinophils Relative 2 %   Eosinophils Absolute 0.1 0.0 -  0.7 K/uL   Basophils Relative 1 %   Basophils Absolute 0.1 0.0 - 0.1 K/uL  Basic metabolic panel  Result Value Ref Range   Sodium 138 135 - 145 mmol/L   Potassium 3.9 3.5 - 5.1 mmol/L   Chloride 104 101 - 111 mmol/L   CO2 26 22 - 32 mmol/L   Glucose, Bld 111 (H) 65 - 99 mg/dL   BUN 11 6 - 20 mg/dL   Creatinine, Ser 0.92 0.61 - 1.24 mg/dL   Calcium 9.6 8.9 - 10.3 mg/dL   GFR calc non Af Amer >60 >60 mL/min   GFR calc Af Amer >60 >60 mL/min   Anion gap 8 5 - 15      Assessment & Plan:   Encounter Diagnoses  Name Primary?  . Essential hypertension, benign Yes  . Lung disease   . Mood disorder ([place])   . Dyspnea, unspecified type   . Gastroesophageal reflux disease, esophagitis presence not specified     -Pt is to continue with his multiple specialists -no changes to medications today -no tests needed at this time -pt to Follow up in 4 months.  RTO sooner prn

## 2017-04-12 ENCOUNTER — Ambulatory Visit (INDEPENDENT_AMBULATORY_CARE_PROVIDER_SITE_OTHER): Payer: Medicaid Other | Admitting: Urology

## 2017-04-12 DIAGNOSIS — R972 Elevated prostate specific antigen [PSA]: Secondary | ICD-10-CM

## 2017-04-21 ENCOUNTER — Ambulatory Visit (INDEPENDENT_AMBULATORY_CARE_PROVIDER_SITE_OTHER): Payer: Medicaid Other | Admitting: Nurse Practitioner

## 2017-04-21 ENCOUNTER — Encounter: Payer: Self-pay | Admitting: Nurse Practitioner

## 2017-04-21 VITALS — BP 123/74 | HR 73 | Temp 97.6°F | Ht 70.0 in | Wt 178.6 lb

## 2017-04-21 DIAGNOSIS — R1033 Periumbilical pain: Secondary | ICD-10-CM | POA: Diagnosis not present

## 2017-04-21 NOTE — Patient Instructions (Signed)
1. I'm glad you're doing better. 2. Add fiber to your diet, you can try Benefiber or Metamucil. 3. Call if you have worsening abdominal pain. 4. Continue your other medications. 5. Otherwise, return for follow-up in 3 months.

## 2017-04-21 NOTE — Assessment & Plan Note (Signed)
Overall he seems to be doing well. Occasional/intermittent abdominal pain which typically self resolves after a couple minutes. Has a bowel movement 1-3 times a day with a sensation of incomplete emptying and lower abdominal discomfort resolves with a bowel movement. Some fatigue with Imuran. GERD is currently well managed on PPI and Zantac. He is not on any kind of over-the-counter management for possible constipation. I will have him start Metamucil or Benefiber for increased fiber, and call for any worsening of his abdominal pain. Continue current medications. At this point I doubt this is a recurrence of diverticulitis. If he does have worsening symptoms we can plan for CT imaging to document any further diverticulitis. Return for follow-up in 3 months.

## 2017-04-21 NOTE — Progress Notes (Signed)
Referring Provider: Soyla Dryer, PA-C Primary Care Physician:  Soyla Dryer, PA-C Primary GI:  Dr. Gala Romney  Chief Complaint  Patient presents with  . Diverticulitis    HPI:   Shawn [name] is a 46 y.o. male who presents For follow-up on diverticulitis. The patient was last seen in our office 01/13/2017 for history of adenomatous colon polyps and GERD. 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 He has a history of focal sigmoid diverticulitis in November 2014.   CT 12/26 first CT documentation with mild active diverticulitis of sigmoid colon. CT 11/22/16 with persistent soft tissue stranding about the sigmoid colon with multiple colonic diverticula, suspicious for persistent or recurrent diverticulitis. The CT was reviewed by AB and radiology which found CT from December and January are fairly similar, showing wall thickening of sigmoid. No abscess, perforation, concerning findings. Originally treated with Cipro and Flagyl end of December, with another round of Cipro and Flagyl that was to complete end of Jan.  Gastric emptying study noted to be normal in April 2017, Dexilant helping with GERD symptoms. Abdominal pain was resolved at his last visit and having good bowel movements. He was referred for colonoscopy.  Colonoscopy was completed on 01/19/2017 which found diverticulosis in the entire examined colon, a single 8 mm polyp in the ascending colon, internal hemorrhoids, otherwise normal. Surgical pathology found the polyp to be sessile serrated polyp/adenoma without dysplasia. Recommended 5 year repeat colonoscopy.  Today he states he feels alright. He feels his diverticulitis "is acting up a little bit" with noted  intermittent mid-abdominal discomfort, 3 days of pain which tylically lasts a couple minutes and self-resolves and may or may not return later in the day. Has a bowel movement 1-3 times a day, sensation of incomplete emptying. Abdominal discomfort tends to improve with bowel movement. Denies fever, chills, N/V, hematochezia, melena, unintentional weight loss. Has some fatigue with Imuran but he manages. GERD well managed on PPI and Zantac; worse with greasy foods. Vomiting much less. Denies chest pain, worsening dyspnea, lightheadedness, syncope, near syncope. Denies any other upper or lower GI symptoms.  Past Medical History:  Diagnosis Date  . Anemia   . Arthritis   . Back pain   . Bipolar 1 disorder ([place])   . Bipolar disorder ([place])   . 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 COPD (chronic obstructive pulmonary disease) ([place])   . Diverticulitis   . Family history of adverse reaction to anesthesia    he was adopted  . GERD (gastroesophageal reflux disease)   . Heart disease    "irregular heart beat"  . Heart murmur   . HOH (hard of hearing)    biological parents are both deaf.   left ear is good.  . Hyperlipidemia   . Hypertension   . 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 Lung nodules   . Schizoaffective disorder, bipolar type ([place])   . Vomiting     Past Surgical History:  Procedure Laterality Date  . BRAIN SURGERY  2005   after head injury, patient states had 2 large blood clots evacuated  . COLONOSCOPY WITH ESOPHAGOGASTRODUODENOSCOPY (EGD) N/A 12/05/2013   NGE:XBMW erosive relfux/HH/melanosis  coli/colonic diverticulosis. tubulovillous adenoma removed. next tcs 11/2016  . COLONOSCOPY WITH PROPOFOL N/A 01/19/2017   Procedure: COLONOSCOPY WITH PROPOFOL;  Surgeon: Daneil Dolin, MD;  [facility];  Service: Endoscopy;  Laterality: N/A;  8:30am  . FLEXIBLE BRONCHOSCOPY N/A 01/23/2016   Procedure: FLEXIBLE BRONCHOSCOPY;  Surgeon: Gaye Pollack,  MD;  Location: [facility];  Service: Thoracic;  Laterality: N/A;  . LUNG BIOPSY N/A 01/23/2016   Procedure: LEFT LUNG BIOPSY;  Surgeon: Gaye Pollack, MD;  Location: [facility];  Service: Thoracic;  Laterality: N/A;  . POLYPECTOMY  01/19/2017   Procedure: POLYPECTOMY;  Surgeon: Daneil Dolin, MD;  [facility]: [facility];  Service: Endoscopy;;  ascending colon polyp  . VIDEO ASSISTED THORACOSCOPY Left 01/23/2016   Procedure: LEFT VIDEO ASSISTED THORACOSCOPY;  Surgeon: Gaye Pollack, MD;  Location: [facility];  Service: Thoracic;  Laterality: Left;    Current Outpatient Prescriptions  Medication Sig Dispense Refill  . azaTHIOprine (IMURAN) 50 MG tablet Take 100 mg by mouth daily.    . cetirizine (ZYRTEC) 10 MG tablet Take 10 mg by mouth daily.    . Cyanocobalamin (VITAMIN B-12) 5000 MCG TBDP Take 5,000 mcg by mouth daily.    . cyclobenzaprine (FLEXERIL) 10 MG tablet Take 1 tablet (10 mg total) by mouth 3 (three) times daily. 20 tablet 0  . dexlansoprazole (DEXILANT) 60 MG capsule Take 1 capsule (60 mg total) by mouth daily. 30 capsule 5  . diclofenac (VOLTAREN) 75 MG EC tablet Take 75 mg by mouth 2 (two) times daily.     . DULoxetine (CYMBALTA) 60 MG capsule Take 60 mg by mouth 2 (two) times daily. Reported on 05/03/2016    . Elastic Bandages & Supports (LUMBAR BACK BRACE/SUPPORT PAD) MISC Wear as needed 1 each 0  . FLUoxetine (PROZAC) 40 MG capsule Take 40 mg by mouth daily.    Marland Kitchen gabapentin (NEURONTIN) 300 MG capsule Take 300 mg by mouth 3 (three) times daily.     Marland Kitchen ibuprofen (ADVIL,MOTRIN) 800 MG tablet Take 1 tablet (800 mg total) by mouth 3 (three) times daily. 21 tablet 0  . lamoTRIgine (LAMICTAL) 150 MG tablet Take 150 mg by mouth 2 (two) times daily.     Marland Kitchen lovastatin (MEVACOR) 20 MG tablet Take 20 mg by mouth at bedtime.     . metoprolol tartrate (LOPRESSOR) 25 MG tablet Take 50 mg by mouth daily.     . Multiple Vitamin (MULTIVITAMIN) tablet Take 1 tablet by mouth daily.    Marland Kitchen OLANZapine (ZYPREXA) 10  MG tablet Take 10 mg by mouth at bedtime. For mood swings and help sleep and 5-10mg  as needed for agitation/paranoia    . ondansetron (ZOFRAN) 4 MG tablet TAKE ONE TABLET BY MOUTH EVERY 8 HOURS AS NEEDED FOR NAUSEA OR VOMITING 30 tablet 1  . Potassium 99 MG TABS Take 1 tablet by mouth every morning.    . predniSONE (STERAPRED UNI-PAK 21 TAB) 10 MG (21) TBPK tablet Take 30 mg by mouth daily.    . ranitidine (ZANTAC) 150 MG tablet TAKE 1 TABLET BY MOUTH ONCE DAILY BEFORE EVENING MEAL (Patient taking differently: TAKE 1 TABLET BY MOUTH ONCE DAILY BEFORE BREAKFAST) 30 tablet 5  . risperiDONE (RISPERDAL) 0.5 MG tablet Take 0.5 mg by mouth at bedtime.     . trazodone (DESYREL) 300 MG tablet Take 300 mg by mouth at bedtime.     No current facility-administered medications for this visit.     Allergies as  of 04/21/2017  . (No Known Allergies)    Family History  Problem Relation Age of Onset  . Adopted: Yes  . Alzheimer's disease Other   . Parkinson's disease Other   . Mental illness Other   . Colon cancer Neg Hx        adopted at 50 months old, unsure about GI history of parents     Social History   Social History  . Marital status: Divorced    Spouse name: N/A  . Number of children: 1  . Years of education: N/A   Occupational History  . disabled    Social History Main Topics  . Smoking status: Former Smoker    Packs/day: 0.75    Years: 25.00    Types: Cigars, Cigarettes    Quit date: 07/09/2014  . Smokeless tobacco: Current User    Types: Snuff     Comment: pt uses dip tobacco  . Alcohol use 0.0 oz/week     Comment: occ  . Drug use: No  . Sexual activity: Not Currently    Birth control/ protection: None   Other Topics Concern  . None   Social History Narrative   He is originally from [place]. 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 Has always lived in [place]. Previously have traveled to [place], [place], [place], [place], [place]. Remote travel to [place]. Previously has worked doing Architect.  No known asbestos exposure. Lived in a house with mold around 2001-2002 as well as 2006. Has 2 dogs currently. No bird exposure. No hot tub exposure. Mainly walks for fun. He was incarcerated for 2 years previously. No known TB exposure. Had previous negative PPDs last around 2010. Never lived in a homeless shelter but has eaten at them before.     Review of Systems: General: Negative for anorexia, weight loss, fever, chills. Eyes: Negative for vision changes.  ENT: Negative for hoarseness, difficulty swallowing. CV: Negative for chest pain, angina, palpitations, peripheral edema.  Respiratory: Negative for dyspnea at rest, cough, sputum, wheezing.  GI: See history of present illness. Endo: Negative for unusual weight change.  Heme: Negative for bruising or bleeding.   Physical Exam: BP 123/74   Pulse 73   Temp 97.6 F (36.4 C) (Oral)   Ht 5\' 10"  (1.778 m)   Wt 178 lb 9.6 oz (81 kg)   BMI 25.63 kg/m  General:   Alert and oriented. Pleasant and cooperative. Well-nourished and well-developed.  Eyes:  Without icterus, sclera clear and conjunctiva pink.  Ears:  Normal auditory acuity. Cardiovascular:  S1, S2 present without murmurs appreciated. Extremities without clubbing or edema. Respiratory:  Clear to auscultation bilaterally. No wheezes, rales, or rhonchi. No distress.  Gastrointestinal:  +BS, soft, non-tender and non-distended. No HSM noted. No guarding or rebound. No masses appreciated.  Rectal:  Deferred  Musculoskalatal:  Symmetrical without gross deformities. Neurologic:  Alert and oriented x4;  grossly normal neurologically. Psych:  Alert and cooperative. Normal mood and affect. Heme/Lymph/Immune: No excessive bruising noted.    04/21/2017 10:26 AM   Disclaimer: This note was dictated with voice recognition software. Similar sounding words can inadvertently be transcribed and may not be corrected upon review.

## 2017-04-21 NOTE — Progress Notes (Signed)
cc'ed to pcp °

## 2017-06-22 ENCOUNTER — Ambulatory Visit (HOSPITAL_COMMUNITY)
Admission: RE | Admit: 2017-06-22 | Discharge: 2017-06-22 | Disposition: A | Discharge status: Home / Self Care | Payer: Medicaid Other | Source: Ambulatory Visit | Attending: Family Medicine | Admitting: Family Medicine

## 2017-06-22 ENCOUNTER — Other Ambulatory Visit (HOSPITAL_COMMUNITY): Payer: Self-pay | Admitting: Family Medicine

## 2017-06-22 DIAGNOSIS — M546 Pain in thoracic spine: Secondary | ICD-10-CM | POA: Diagnosis present

## 2017-06-22 DIAGNOSIS — M47814 Spondylosis without myelopathy or radiculopathy, thoracic region: Secondary | ICD-10-CM | POA: Diagnosis not present

## 2017-07-04 ENCOUNTER — Encounter: Payer: Self-pay | Admitting: Neurology

## 2017-07-04 ENCOUNTER — Ambulatory Visit (INDEPENDENT_AMBULATORY_CARE_PROVIDER_SITE_OTHER): Payer: Medicaid Other | Admitting: Neurology

## 2017-07-04 VITALS — BP 117/78 | HR 79 | Ht 71.0 in | Wt 186.0 lb

## 2017-07-04 DIAGNOSIS — R0681 Apnea, not elsewhere classified: Secondary | ICD-10-CM | POA: Diagnosis not present

## 2017-07-04 DIAGNOSIS — F1190 Opioid use, unspecified, uncomplicated: Secondary | ICD-10-CM

## 2017-07-04 DIAGNOSIS — G4719 Other hypersomnia: Secondary | ICD-10-CM

## 2017-07-04 DIAGNOSIS — R0683 Snoring: Secondary | ICD-10-CM | POA: Diagnosis not present

## 2017-07-04 NOTE — Patient Instructions (Signed)
Based on your symptoms and your exam I believe you are at risk for obstructive sleep apnea or OSA, and I think we should proceed with a sleep study to determine whether you do or do not have OSA and how severe it is. If you have more than mild OSA, I want you to consider treatment with CPAP. Please remember, the risks and ramifications of moderate to severe obstructive sleep apnea or OSA are: Cardiovascular disease, including congestive heart failure, stroke, difficult to control hypertension, arrhythmias, and even type 2 diabetes has been linked to untreated OSA. Sleep apnea causes disruption of sleep and sleep deprivation in most cases, which, in turn, can cause recurrent headaches, problems with memory, mood, concentration, focus, and vigilance. Most people with untreated sleep apnea report excessive daytime sleepiness, which can affect their ability to drive. Please do not drive if you feel sleepy.   I will likely see you back after your sleep study to go over the test results and where to go from there. We will call you after your sleep study to advise about the results (most likely, you will hear from Beverlee Nims, my nurse) and to set up an appointment at the time, as necessary.    Our sleep lab administrative assistant, Arrie Aran will meet with you or call you to schedule your sleep study. If you don't hear back from her by about 2 weeks from now, please feel free to call her at [phone]. This is her direct line and please leave a message with your phone number to call back if you get the voicemail box. She will call back as soon as possible.

## 2017-07-04 NOTE — Progress Notes (Signed)
Subjective:    Patient ID: Shawn [name] is a 46 y.o. male.  HPI     History:   Dear Dr. Maudie Mercury,   I saw your patient, Shawn [name], upon your kind request in my neurologic clinic today for initial consultation of his sleep disorder, in particular difficulty maintaining sleep and also history of breathing pauses while asleep. The patient is unaccompanied today. 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 I reviewed your office note from 04/18/2017, which you kindly included. Of note, he is on multiple sedating medications including trazodone 450 mg a day, Lamictal 150 bid, Cymbalta generic 60 mg twice a day, Zyprexa generic 10 mg daily, oxycodone 5 mg strength, Risperdal, cyclobenzaprine 10 mg 3 times a day, gabapentin 600 mg 3 times a day. He is also on generic Prozac 40 mg daily, metoprolol, diclofenac, ondansetron, azathioprine 100 mg daily and Dexilant. His Epworth sleepiness score is 8 out of 24, fatigue score is 62 out of 63. He is single, lives with his parents, has 1 grown child. He does not smoke cigarettes, Quit in 2015, but uses tobacco, drinks alcohol about twice a week, no day-to-day caffeine, typically decaf coffee in the mornings. His BT is around 10-11 PM. He wakes up around 6:30 or 7. No night to night nocturia. He reports a family history of obstructive sleep apnea and his mother. It does not sound like she is on CPAP. It takes him about 40 minutes to fall asleep. He does not watch TV in his bedroom and does not have a pet in his bedroom. His son is in [place] with his ex-wife.  He has a history of neck and LBP and R hip pain. Has been going to [facility] in [place]. He takes oxycodone as needed. Once every month or so on average, tries to avoid taking it on a regular basis for fear of getting addicted to it. He has some intermittent tingling in the legs and it helps to move his legs but does not have any more telltale symptoms versus leg syndrome. He has some neck pain that triggers headaches.   His Past Medical History Is Significant For: Past Medical History:  Diagnosis Date  . Anemia   . Arthritis   . Back pain   . Bipolar 1 disorder ([place])   . Bipolar disorder ([place])   . 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 COPD (chronic obstructive pulmonary disease) ([place])   . Diverticulitis   . Family history of adverse reaction to anesthesia    he was adopted  . GERD (gastroesophageal reflux disease)   . Heart disease    "irregular heart beat"  . Heart murmur   . HOH (hard of hearing)    biological parents are both deaf.   left ear is good.  . Hyperlipidemia   . Hypertension   . 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 Lung nodules   . Schizoaffective disorder, bipolar type ([place])   . Vomiting     His Past Surgical History Is Significant For: Past Surgical History:  Procedure Laterality Date  . BRAIN SURGERY  2005   after head injury, patient states had 2  large blood clots evacuated  . COLONOSCOPY WITH ESOPHAGOGASTRODUODENOSCOPY (EGD) N/A 12/05/2013   JJK:KXFG erosive relfux/HH/melanosis coli/colonic diverticulosis. tubulovillous adenoma removed. next tcs 11/2016  . COLONOSCOPY WITH PROPOFOL N/A 01/19/2017   Procedure: COLONOSCOPY WITH PROPOFOL;  Surgeon: Daneil Dolin, MD;  [facility];  Service: Endoscopy;  Laterality: N/A;  8:30am  . FLEXIBLE BRONCHOSCOPY N/A 01/23/2016   Procedure: FLEXIBLE BRONCHOSCOPY;  Surgeon: Gaye Pollack, MD;  Location: [facility];  Service: Thoracic;  Laterality: N/A;  . LUNG BIOPSY N/A 01/23/2016   Procedure: LEFT LUNG BIOPSY;  Surgeon: Gaye Pollack, MD;  Location:  [facility];  Service: Thoracic;  Laterality: N/A;  . POLYPECTOMY  01/19/2017   Procedure: POLYPECTOMY;  Surgeon: Daneil Dolin, MD;  [facility];  Service: Endoscopy;;  ascending colon polyp  . VIDEO ASSISTED THORACOSCOPY Left 01/23/2016   Procedure: LEFT VIDEO ASSISTED THORACOSCOPY;  Surgeon: Gaye Pollack, MD;  Location: [facility];  Service: Thoracic;  Laterality: Left;    His Family History Is Significant For: Family History  Problem Relation Age of Onset  . Adopted: Yes  . Alzheimer's disease Other   . Parkinson's disease Other   . Mental illness Other   . Colon cancer Neg Hx        adopted at 53 months old, unsure about GI history of parents     His Social History Is Significant For: Social History   Social History  . Marital status: Divorced    Spouse name: N/A  . Number of children: 1  . Years of education: N/A   Occupational History  . disabled    Social History Main Topics  . Smoking status: Former Smoker    Packs/day: 0.75    Years: 25.00    Types: Cigars, Cigarettes    Quit date: 07/09/2014  . Smokeless tobacco: Current User    Types: Snuff     Comment: pt uses dip tobacco  . Alcohol use 0.0 oz/week     Comment: occ  . Drug use: No  . Sexual activity: Not Currently    Birth control/ protection: None   Other Topics Concern  . None   Social History Narrative   He is originally from [place]. 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 Has always lived in [place]. Previously have traveled to [place], [place], [place], [place], [place]. Remote travel to [place]. 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 Lived in a house with mold around 2001-2002 as well as 2006. Has 2 dogs currently. No bird exposure. No hot tub exposure. Mainly walks for fun. He was incarcerated for 2 years previously. No known TB exposure. Had previous negative PPDs last around 2010. Never lived in a homeless shelter but has eaten at them before.     His Allergies Are:  No Known Allergies:    His Current Medications Are:  Outpatient Encounter Prescriptions as of 07/04/2017  Medication Sig  . azaTHIOprine (IMURAN) 50 MG tablet Take 100 mg by mouth daily.  . cetirizine (ZYRTEC) 10 MG tablet Take 10 mg by mouth daily.  . Cyanocobalamin (VITAMIN B-12) 5000 MCG TBDP Take 5,000 mcg by mouth daily.  . cyclobenzaprine (FLEXERIL) 10 MG tablet Take 1 tablet (10 mg total) by mouth 3 (three) times daily.  Marland Kitchen dexlansoprazole (DEXILANT) 60 MG capsule Take 1 capsule (60 mg total) by mouth daily.  . diclofenac (VOLTAREN) 75 MG EC tablet Take 75 mg by mouth 2 (two) times daily.   Marland Kitchen  DULoxetine (CYMBALTA) 60 MG capsule Take 60 mg by mouth 2 (two) times daily. Reported on 05/03/2016  . Elastic Bandages & Supports (LUMBAR BACK BRACE/SUPPORT PAD) MISC Wear as needed  . FLUoxetine (PROZAC) 40 MG capsule Take 40 mg by mouth daily.  Marland Kitchen gabapentin (NEURONTIN) 300 MG capsule Take 300 mg by mouth 3 (three) times daily.   Marland Kitchen ibuprofen (ADVIL,MOTRIN) 800 MG tablet Take 1 tablet (800 mg total) by mouth 3 (three) times daily.  Marland Kitchen lamoTRIgine (LAMICTAL) 150 MG tablet Take 150 mg by mouth 2 (two) times daily.   Marland Kitchen lovastatin (MEVACOR) 20 MG tablet Take 20 mg by mouth at bedtime.   . metoprolol tartrate (LOPRESSOR) 25 MG tablet Take 50 mg by mouth daily.   . Multiple Vitamin (MULTIVITAMIN) tablet Take 1 tablet by mouth daily.  Marland Kitchen OLANZapine (ZYPREXA) 10 MG tablet Take 10 mg by mouth at bedtime. For mood swings and help sleep and 5-10mg  as needed for agitation/paranoia  . ondansetron (ZOFRAN) 4 MG tablet TAKE ONE TABLET BY MOUTH EVERY 8 HOURS AS NEEDED FOR NAUSEA OR VOMITING  . Potassium 99 MG TABS Take 1 tablet by mouth every morning.  . predniSONE (STERAPRED UNI-PAK 21 TAB) 10 MG (21) TBPK tablet Take 30 mg by mouth daily.  . ranitidine (ZANTAC) 150 MG tablet TAKE 1 TABLET BY MOUTH ONCE DAILY BEFORE EVENING MEAL (Patient taking differently: TAKE 1 TABLET BY MOUTH ONCE DAILY BEFORE BREAKFAST)  . risperiDONE (RISPERDAL)  0.5 MG tablet Take 0.5 mg by mouth at bedtime.   . trazodone (DESYREL) 300 MG tablet Take 450 mg by mouth at bedtime.    No facility-administered encounter medications on file as of 07/04/2017.   :  Review of Systems:  Out of a complete 14 point review of systems, all are reviewed and negative with the exception of these symptoms as listed below: Review of Systems  Neurological:       Pt presents today to discuss his sleep. Pt has never had a sleep study but does endorse snoring. Pt wakes up frequently during the night.  Epworth Sleepiness Scale 0= would never doze 1= slight chance of dozing 2= moderate chance of dozing 3= high chance of dozing  Sitting and reading: 2 Watching TV: 3 Sitting inactive in a public place (ex. Theater or meeting): 1 As a passenger in a car for an hour without a break: 0 Lying down to rest in the afternoon: 2 Sitting and talking to someone: 0 Sitting quietly after lunch (no alcohol): 0 In a car, while stopped in traffic: 0 Total: 8     Objective:  Neurological Exam  Physical Exam Physical Examination:   Vitals:   07/04/17 1335  BP: 117/78  Pulse: 79    General Examination: The patient is a very pleasant 46 y.o. male in no acute distress. He appears well-developed and well-nourished and well groomed.   HEENT: Normocephalic, atraumatic, pupils are equal, round and reactive to light and accommodation. Funduscopic exam is normal with sharp disc margins noted. Extraocular tracking is good without limitation to gaze excursion or nystagmus noted. Normal smooth pursuit is noted. Hearing is grossly intact. Face is symmetric with normal facial animation and normal facial sensation. Speech is clear with no dysarthria noted. There is no hypophonia. There is no lip, neck/head, jaw or voice tremor. Neck is supple with full range of passive and active motion. There are no carotid bruits on auscultation. Oropharynx exam reveals: moderate mouth dryness, marginal  dental hygiene and moderate airway  crowding, due to redundant soft palate, larger appearing uvula and tonsil of 2+ on the R and 1+ on the L. Mallampati is class II. Tongue protrudes centrally and palate elevates symmetrically. Neck size is 15 1/8 inches.   Chest: Clear to auscultation without wheezing, rhonchi or crackles noted.  Heart: S1+S2+0, regular and normal without murmurs, rubs or gallops noted.   Abdomen: Soft, non-tender and non-distended with normal bowel sounds appreciated on auscultation.  Extremities: There is no pitting edema in the distal lower extremities bilaterally. Pedal pulses are intact.  Skin: Warm and dry without trophic changes noted. There are no obvious varicose veins in the distal legs. Wearing shorts.  Musculoskeletal: exam reveals no obvious joint deformities, tenderness or joint swelling or erythema.   Neurologically:  Mental status: The patient is awake, alert and oriented in all 4 spheres. His immediate and remote memory, attention, language skills and fund of knowledge are appropriate. There is no evidence of aphasia, agnosia, apraxia or anomia. Speech is clear with normal prosody and enunciation. Thought process is linear. Mood is normal and affect is normal.  Cranial nerves II - XII are as described above under HEENT exam. In addition: shoulder shrug is normal with equal shoulder height noted. Motor exam: Normal bulk, strength and tone is noted. There is no drift, tremor or rebound. Romberg is negative. Reflexes are 2+ throughout. Fine motor skills and coordination: intact with normal finger taps, normal hand movements, normal rapid alternating patting, normal foot taps and normal foot agility.  Cerebellar testing: No dysmetria or intention tremor on finger to nose testing. Heel to shin is unremarkable bilaterally. There is no truncal or gait ataxia.  Sensory exam: intact to light touch in the upper and lower extremities.  Gait, station and balance: He stands  easily. No veering to one side is noted. No leaning to one side is noted. Posture is age-appropriate and stance is narrow based. Gait shows normal stride length and normal pace. No problems turning are noted. Tandem walk is not possible for him, he does not feel comfortable.   Assessment and Plan:  In summary, Shawn [name] is a 46 y.o. 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 I had a long chat with the patient about my findings and the diagnosis of OSA, its prognosis and treatment options. We talked about medical treatments, surgical interventions and non-pharmacological approaches. I explained in particular the risks and ramifications of untreated moderate to severe OSA, especially with respect to developing cardiovascular disease down the Road, including congestive heart failure, difficult to treat hypertension, cardiac arrhythmias, or stroke. Even type 2 diabetes has, in part, been linked to untreated OSA. Symptoms of untreated OSA include daytime sleepiness, memory problems, mood irritability and mood disorder such as depression and anxiety, lack of energy, as well as recurrent headaches, especially morning headaches. We talked about nicotine cessation and trying to maintain a healthy lifestyle in general, as well as the importance of weight control. I encouraged the patient to eat healthy, exercise daily and keep well hydrated, to keep a scheduled bedtime and wake time routine, to not skip any meals and eat healthy snacks in between meals. I advised the patient not to drive when feeling sleepy. I recommended the following at this time: sleep study with potential  positive airway pressure titration. (We will  score hypopneas at 4%).   I explained the sleep test procedure to the patient and also outlined possible surgical and non-surgical treatment options of OSA, including the use of a custom-made dental device (which would require a referral to a specialist dentist or oral surgeon), upper airway surgical options, such as pillar implants, radiofrequency surgery, tongue base surgery, and UPPP (which would involve a referral to an ENT surgeon). Rarely, jaw surgery such as mandibular advancement may be considered.  I also explained the CPAP treatment option to the patient, who indicated that he would be willing to try CPAP if the need arises. I explained the importance of being compliant with PAP treatment, not only for insurance purposes but primarily to improve His symptoms, and for the patient's long term health benefit, including to reduce His cardiovascular risks. I answered all his questions today and the patient was in agreement. I would like to see him back after the sleep study is completed and encouraged him to call with any interim questions, concerns, problems or updates.   Thank you very much for allowing me to participate in the care of this nice patient. If I can be of any further assistance to you please do not hesitate to call me at [phone].  Sincerely,   Star Age, MD, PhD

## 2017-07-19 ENCOUNTER — Encounter (HOSPITAL_COMMUNITY): Payer: Self-pay

## 2017-07-19 ENCOUNTER — Emergency Department (HOSPITAL_COMMUNITY)
Admission: EM | Admit: 2017-07-19 | Discharge: 2017-07-19 | Disposition: A | Discharge status: Home / Self Care | Payer: Medicaid Other | Attending: Emergency Medicine | Admitting: Emergency Medicine

## 2017-07-19 DIAGNOSIS — Z79899 Other long term (current) drug therapy: Secondary | ICD-10-CM | POA: Insufficient documentation

## 2017-07-19 DIAGNOSIS — Z87891 Personal history of nicotine dependence: Secondary | ICD-10-CM | POA: Diagnosis not present

## 2017-07-19 DIAGNOSIS — J449 Chronic obstructive pulmonary disease, unspecified: Secondary | ICD-10-CM | POA: Diagnosis not present

## 2017-07-19 DIAGNOSIS — I10 Essential (primary) hypertension: Secondary | ICD-10-CM | POA: Insufficient documentation

## 2017-07-19 DIAGNOSIS — K625 Hemorrhage of anus and rectum: Secondary | ICD-10-CM | POA: Diagnosis not present

## 2017-07-19 LAB — TYPE AND SCREEN
ABO/RH(D): O POS
Antibody Screen: NEGATIVE

## 2017-07-19 LAB — CBC
HCT: 38.3 % — ABNORMAL LOW (ref 39.0–52.0)
Hemoglobin: 13.1 g/dL (ref 13.0–17.0)
MCH: 33.8 pg (ref 26.0–34.0)
MCHC: 34.2 g/dL (ref 30.0–36.0)
MCV: 98.7 fL (ref 78.0–100.0)
PLATELETS: 237 10*3/uL (ref 150–400)
RBC: 3.88 MIL/uL — AB (ref 4.22–5.81)
RDW: 14.2 % (ref 11.5–15.5)
WBC: 5.9 10*3/uL (ref 4.0–10.5)

## 2017-07-19 LAB — COMPREHENSIVE METABOLIC PANEL
ALK PHOS: 51 U/L (ref 38–126)
ALT: 18 U/L (ref 17–63)
AST: 18 U/L (ref 15–41)
Albumin: 4.1 g/dL (ref 3.5–5.0)
Anion gap: 8 (ref 5–15)
BUN: 14 mg/dL (ref 6–20)
CALCIUM: 8.9 mg/dL (ref 8.9–10.3)
CHLORIDE: 104 mmol/L (ref 101–111)
CO2: 25 mmol/L (ref 22–32)
CREATININE: 0.91 mg/dL (ref 0.61–1.24)
Glucose, Bld: 104 mg/dL — ABNORMAL HIGH (ref 65–99)
Potassium: 4.2 mmol/L (ref 3.5–5.1)
SODIUM: 137 mmol/L (ref 135–145)
Total Bilirubin: 0.3 mg/dL (ref 0.3–1.2)
Total Protein: 7.7 g/dL (ref 6.5–8.1)

## 2017-07-19 MED ORDER — SODIUM CHLORIDE 0.9 % IV BOLUS (SEPSIS)
1000.0000 mL | Freq: Once | INTRAVENOUS | Status: AC
  Administered 2017-07-19: 1000 mL via INTRAVENOUS

## 2017-07-19 MED ORDER — HYDROCORTISONE ACETATE 25 MG RE SUPP: 25.0000 mg | Freq: Two times a day (BID) | RECTAL | 0 refills | Status: DC

## 2017-07-19 NOTE — ED Notes (Signed)
POC Occult Blood set up in room

## 2017-07-19 NOTE — ED Provider Notes (Signed)
[facility] DEPT Provider Note   CSN: [id number] Arrival date & time: 07/19/17  1207     History   Chief Complaint Chief Complaint  Patient presents with  . Rectal Bleeding    HPI Shawn [name] is a 46 y.o. male.  Pt presents to the ED today with a 3 day hx of rectal bleeding.  Pt denies any anal trauma or anal sex.  The pt said he does not have any pain associated with it.  He is not on any blood thinners.  He feels a little "dizzy headed."  Pt is followed by Dr. Gala Romney for diverticulitis and adenomatous colon polyps.  His last colonoscopy was in March of this year.  Results:  Colonoscopy was completed on 01/19/2017 which found diverticulosis in the entire examined colon, a single 8 mm polyp in the ascending colon, internal hemorrhoids, otherwise normal. Surgical pathology found the polyp to be sessile serrated polyp/adenoma without dysplasia. Recommended 5 year repeat colonoscopy.        Past Medical History:  Diagnosis Date  . Anemia   . Arthritis   . Back pain   . Bipolar 1 disorder ([place])   . Bipolar disorder ([place])   . 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 COPD (chronic obstructive pulmonary disease) ([place])   . Diverticulitis   . Family history of adverse reaction to anesthesia    he was adopted  . GERD (gastroesophageal reflux disease)   . Heart disease    "irregular heart beat"  . Heart murmur   . HOH (hard of hearing)    biological parents are both deaf.   left ear is good.  . Hyperlipidemia   . Hypertension   . 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 Lung nodules   . Schizoaffective disorder, bipolar type ([place])   . Vomiting     Patient Active Problem List   Diagnosis Date Noted  . Abnormal CT scan, colon 11/17/2016  . Hx of adenomatous colonic polyps 11/17/2016  . Hypotension 04/23/2016  . Fever 04/23/2016  . Cough 04/23/2016  . Tick bites 04/23/2016  . Hypokalemia 04/23/2016  . 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 Multiple lung nodules on CT 01/23/2016  . 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 Schizoaffective disorder ([place]) 12/22/2015  . Environmental allergies 12/22/2015  . Arrhythmia 12/22/2015  . Hyperlipidemia 12/22/2015  . Multiple lung nodules   . Chronic obstructive pulmonary disease ([place]) 11/17/2015  . Abdominal pain, periumbilical 84/16/6063  . Nausea with vomiting 07/03/2015  . Abnormal chest CT 07/03/2015  . Loose stools 10/30/2013  . Diverticulitis of colon (without mention of hemorrhage)(562.11) 10/01/2013  . GERD (gastroesophageal reflux disease) 10/01/2013    Past Surgical History:  Procedure Laterality Date  . BRAIN SURGERY  2005   after head injury, patient states had 2 large blood clots evacuated  . COLONOSCOPY WITH ESOPHAGOGASTRODUODENOSCOPY (EGD) N/A 12/05/2013   [facility]:WFUX erosive relfux/HH/melanosis coli/colonic diverticulosis. tubulovillous adenoma removed. next tcs 11/2016  . COLONOSCOPY WITH PROPOFOL N/A 01/19/2017   Procedure: COLONOSCOPY WITH PROPOFOL;  Surgeon: Daneil Dolin, MD;  [facility];  Service: Endoscopy;  Laterality: N/A;  8:30am  . FLEXIBLE BRONCHOSCOPY N/A 01/23/2016   Procedure: FLEXIBLE BRONCHOSCOPY;  Surgeon: Gaye Pollack, MD;  Location: [facility];  Service: Thoracic;  Laterality: N/A;  . LUNG BIOPSY N/A 01/23/2016   Procedure: LEFT LUNG BIOPSY;  Surgeon: Gaye Pollack, MD;  Location: [facility];  Service: Thoracic;  Laterality: N/A;  . POLYPECTOMY  01/19/2017   Procedure: POLYPECTOMY;  Surgeon: Daneil Dolin, MD;  [facility];  Service: Endoscopy;;  ascending colon polyp  . VIDEO ASSISTED THORACOSCOPY Left 01/23/2016   Procedure: LEFT VIDEO ASSISTED THORACOSCOPY;  Surgeon: Gaye Pollack, MD;  Location: [facility];  Service: Thoracic;  Laterality: Left;       Home Medications    Prior to Admission medications   Medication Sig Start Date End Date Taking? Authorizing Provider  azaTHIOprine (IMURAN) 50 MG tablet Take 100 mg  by mouth daily.   Yes [provider]  cetirizine (ZYRTEC) 10 MG tablet Take 10 mg by mouth daily.   Yes [provider]  Cyanocobalamin (VITAMIN B-12) 5000 MCG TBDP Take 5,000 mcg by mouth daily.   Yes [provider]  cyclobenzaprine (FLEXERIL) 10 MG tablet Take 1 tablet (10 mg total) by mouth 3 (three) times daily. 02/06/17  Yes Lily Kocher, PA-C  dexlansoprazole (DEXILANT) 60 MG capsule Take 1 capsule (60 mg total) by mouth daily. 01/10/17  Yes Soyla Dryer, PA-C  diclofenac (VOLTAREN) 75 MG EC tablet Take 75 mg by mouth 2 (two) times daily.    Yes [provider]  DULoxetine (CYMBALTA) 60 MG capsule Take 60 mg by mouth 2 (two) times daily. Reported on 05/03/2016   Yes [provider]  Elastic Bandages & Supports (LUMBAR BACK BRACE/SUPPORT PAD) MISC Wear as needed 07/01/16  Yes Soyla Dryer, PA-C  FLUoxetine (PROZAC) 40 MG capsule Take 40 mg by mouth daily.   Yes [provider]  gabapentin (NEURONTIN) 300 MG capsule Take 300 mg by mouth 3 (three) times daily.    Yes [provider]  ibuprofen (ADVIL,MOTRIN) 800 MG tablet Take 1 tablet (800 mg total) by mouth 3 (three) times daily. 02/06/17  Yes Lily Kocher, PA-C  lamoTRIgine (LAMICTAL) 150 MG tablet Take 150 mg by mouth 2 (two) times daily.    Yes [provider]  lovastatin (MEVACOR) 20 MG tablet Take 20 mg by mouth at bedtime.    Yes [provider]  metoprolol tartrate (LOPRESSOR) 25 MG tablet Take 50 mg by mouth daily.    Yes [provider]  Multiple Vitamin (MULTIVITAMIN) tablet Take 1 tablet by mouth daily.   Yes [provider]  OLANZapine (ZYPREXA) 10 MG tablet Take 10 mg by mouth at bedtime. For mood swings and help sleep and 5-10mg  as needed for agitation/paranoia   Yes [provider]  ondansetron (ZOFRAN) 4 MG tablet TAKE ONE TABLET BY MOUTH EVERY 8 HOURS AS NEEDED FOR NAUSEA OR VOMITING 04/20/16  Yes Carlis Stable, NP    oxyCODONE-acetaminophen (PERCOCET/ROXICET) 5-325 MG tablet Take by mouth every 6 (six) hours as needed for severe pain.   Yes [provider]  Potassium 99 MG TABS Take 1 tablet by mouth every morning.   Yes [provider]  ranitidine (ZANTAC) 150 MG tablet TAKE 1 TABLET BY MOUTH ONCE DAILY BEFORE EVENING MEAL Patient taking differently: TAKE 1 TABLET BY MOUTH ONCE DAILY BEFORE BREAKFAST 11/05/16  Yes Annitta Needs, NP  risperiDONE (RISPERDAL) 0.5 MG tablet Take 0.5 mg by mouth at bedtime.    Yes [provider]  trazodone (DESYREL) 300 MG tablet Take 450 mg by mouth at bedtime.    Yes [provider]  hydrocortisone (ANUSOL-HC) 25 MG suppository Place 1 suppository (25 mg total) rectally 2 (two) times daily. 07/19/17   Isla Pence, MD  Family History Family History  Problem Relation Age of Onset  . Adopted: Yes  . Alzheimer's disease Other   . Parkinson's disease Other   . Mental illness Other   . Colon cancer Neg Hx        adopted at 90 months old, unsure about GI history of parents     Social History Social History  Substance Use Topics  . Smoking status: Former Smoker    Packs/day: 0.75    Years: 25.00    Types: Cigars, Cigarettes    Quit date: 07/09/2014  . Smokeless tobacco: Current User    Types: Snuff     Comment: pt uses dip tobacco  . Alcohol use 0.0 oz/week     Comment: occ     Allergies   Patient has no known allergies.   Review of Systems Review of Systems  Gastrointestinal: Positive for blood in stool.  All other systems reviewed and are negative.    Physical Exam Updated Vital Signs BP 121/89 (BP Location: Right Arm)   Pulse 99   Temp 98.8 F (37.1 C) (Oral)   Resp 17   Ht 5\' 11"  (1.803 m)   Wt 84.4 kg (186 lb)   SpO2 99%   BMI 25.94 kg/m   Physical Exam  Constitutional: He appears well-developed and well-nourished.  HENT:  Head: Normocephalic and atraumatic.  Right Ear: External ear normal.   Left Ear: External ear normal.  Nose: Nose normal.  Mouth/Throat: Oropharynx is clear and moist.  Eyes: Pupils are equal, round, and reactive to light. Conjunctivae and EOM are normal.  Neck: Normal range of motion. Neck supple.  Cardiovascular: Normal rate, regular rhythm, normal heart sounds and intact distal pulses.   Pulmonary/Chest: Effort normal and breath sounds normal.  Abdominal: Soft. Bowel sounds are normal.  Genitourinary: Rectal exam shows guaiac positive stool.  Musculoskeletal: Normal range of motion.  Neurological: He is alert.  Skin: Skin is warm.  Psychiatric: He has a normal mood and affect. His behavior is normal. Judgment and thought content normal.  Nursing note and vitals reviewed.    ED Treatments / Results  Labs (all labs ordered are listed, but only abnormal results are displayed) Labs Reviewed  COMPREHENSIVE METABOLIC PANEL - Abnormal; Notable for the following:       Result Value   Glucose, Bld 104 (*)    All other components within normal limits  CBC - Abnormal; Notable for the following:    RBC 3.88 (*)    HCT 38.3 (*)    All other components within normal limits  POC OCCULT BLOOD, ED  TYPE AND SCREEN    EKG  EKG Interpretation None       Radiology No results found.  Procedures Procedures (including critical care time)  Medications Ordered in ED Medications  sodium chloride 0.9 % bolus 1,000 mL (1,000 mLs Intravenous New Bag/Given 07/19/17 1340)     Initial Impression / Assessment and Plan / ED Course  I have reviewed the triage vital signs and the nursing notes.  Pertinent labs & imaging results that were available during my care of the patient were reviewed by me and considered in my medical decision making (see chart for details).    Pt's hemoglobin is good and sx have been going on for 3 days.  I suspect the bleeding is from the internal hemorrhoids he is known to have.  The pt is given a rx for anusol.  He has an appt with  Dr.  Rourk on Friday the 14th.  He knows to return if sx worsen.  Final Clinical Impressions(s) / ED Diagnoses   Final diagnoses:  Rectal bleeding    New Prescriptions New Prescriptions   HYDROCORTISONE (ANUSOL-HC) 25 MG SUPPOSITORY    Place 1 suppository (25 mg total) rectally 2 (two) times daily.     Isla Pence, MD 07/19/17 1504

## 2017-07-19 NOTE — ED Triage Notes (Signed)
Reports of rectal bleeding x3 days with abdominal pain. States blood is bright and dark red.

## 2017-07-20 ENCOUNTER — Ambulatory Visit (INDEPENDENT_AMBULATORY_CARE_PROVIDER_SITE_OTHER): Payer: Medicaid Other | Admitting: Neurology

## 2017-07-20 DIAGNOSIS — F1190 Opioid use, unspecified, uncomplicated: Secondary | ICD-10-CM

## 2017-07-20 DIAGNOSIS — G4719 Other hypersomnia: Secondary | ICD-10-CM

## 2017-07-20 DIAGNOSIS — G4761 Periodic limb movement disorder: Secondary | ICD-10-CM | POA: Diagnosis not present

## 2017-07-20 DIAGNOSIS — G4720 Circadian rhythm sleep disorder, unspecified type: Secondary | ICD-10-CM

## 2017-07-20 DIAGNOSIS — R0683 Snoring: Secondary | ICD-10-CM

## 2017-07-20 DIAGNOSIS — R0681 Apnea, not elsewhere classified: Secondary | ICD-10-CM

## 2017-07-21 ENCOUNTER — Ambulatory Visit (INDEPENDENT_AMBULATORY_CARE_PROVIDER_SITE_OTHER): Payer: Medicaid Other | Admitting: Gastroenterology

## 2017-07-21 ENCOUNTER — Telehealth: Payer: Self-pay

## 2017-07-21 ENCOUNTER — Encounter: Payer: Self-pay | Admitting: Gastroenterology

## 2017-07-21 VITALS — BP 120/78 | HR 81 | Temp 96.5°F | Ht 71.0 in | Wt 184.2 lb

## 2017-07-21 DIAGNOSIS — K219 Gastro-esophageal reflux disease without esophagitis: Secondary | ICD-10-CM | POA: Diagnosis not present

## 2017-07-21 DIAGNOSIS — K625 Hemorrhage of anus and rectum: Secondary | ICD-10-CM

## 2017-07-21 NOTE — Telephone Encounter (Signed)
-----   Message from Star Age, MD sent at 07/21/2017  7:54 AM EDT ----- Patient referred by Dr. Maudie Mercury, seen by me on 07/04/17, diagnostic PSG on 07/20/17.   Please call and notify the patient that the recent sleep study did not show any significant obstructive sleep apnea, but unfortunately he did not sleep very much (TST of 121 min) and no REM sleep was achieved. Snoring was noted, ranging from mild to louder. For disturbing snoring, an oral appliance (through a qualified dentist) can be considered.  Please remind patient to try to maintain good sleep hygiene, which means: Keep a regular sleep and wake schedule and make enough time for sleep (7 1/2 to 8 1/2 hours for the average adult), try not to exercise or have a meal within 2 hours of your bedtime, try to keep your bedroom conducive for sleep, that is, cool and dark, without light distractors such as an illuminated alarm clock, and refrain from watching TV right before sleep or in the middle of the night and do not keep the TV or radio on during the night. If a nightlight is used, have it away from the visual field. Also, try not to use or play on electronic devices at bedtime, such as your cell phone, tablet PC or laptop. If you like to read at bedtime on an electronic device, try to dim the background light as much as possible. Do not eat in the middle of the night. Keep pets away from the bedroom environment. For stress relief, try meditation, deep breathing exercises (there are many books and CDs available), a white noise machine or fan can help to diffuse other noise distractors, such as traffic noise. Do not drink alcohol before bedtime, as it can disturb sleep and cause middle of the night awakenings. Never mix alcohol and sedating medications! Avoid narcotic pain medication close to bedtime, as opioids/narcotics can suppress breathing drive and breathing effort.   Please inform patient that he should FU with PCP.  Once you have spoken to patient, you  can close this encounter.   Thanks,  Star Age, MD, PhD [facility])

## 2017-07-21 NOTE — Assessment & Plan Note (Signed)
Likely benign anorectal bleeding from internal hemorrhoids. Denies significant volume blood per rectum. Colonoscopy in March with diverticulosis, internal hemorrhoids, polyp as outlined. Patient refuses to continue hemorrhoid treatment with topical regimen. States he feels like the bleeding is subsiding and he doesn't feel like he needs treatment. Encouraged him to call me if he has ongoing bleeding. We did briefly discuss hemorrhoid banding options but since this is his first episode of flare is not interested at this time. Return to the office as needed.

## 2017-07-21 NOTE — Progress Notes (Signed)
Patient referred by Dr. Maudie Mercury, seen by me on 07/04/17, diagnostic PSG on 07/20/17.   Please call and notify the patient that the recent sleep study did not show any significant obstructive sleep apnea, but unfortunately he did not sleep very much (TST of 121 min) and no REM sleep was achieved. Snoring was noted, ranging from mild to louder. For disturbing snoring, an oral appliance (through a qualified dentist) can be considered.  Please remind patient to try to maintain good sleep hygiene, which means: Keep a regular sleep and wake schedule and make enough time for sleep (7 1/2 to 8 1/2 hours for the average adult), try not to exercise or have a meal within 2 hours of your bedtime, try to keep your bedroom conducive for sleep, that is, cool and dark, without light distractors such as an illuminated alarm clock, and refrain from watching TV right before sleep or in the middle of the night and do not keep the TV or radio on during the night. If a nightlight is used, have it away from the visual field. Also, try not to use or play on electronic devices at bedtime, such as your cell phone, tablet PC or laptop. If you like to read at bedtime on an electronic device, try to dim the background light as much as possible. Do not eat in the middle of the night. Keep pets away from the bedroom environment. For stress relief, try meditation, deep breathing exercises (there are many books and CDs available), a white noise machine or fan can help to diffuse other noise distractors, such as traffic noise. Do not drink alcohol before bedtime, as it can disturb sleep and cause middle of the night awakenings. Never mix alcohol and sedating medications! Avoid narcotic pain medication close to bedtime, as opioids/narcotics can suppress breathing drive and breathing effort.   Please inform patient that he should FU with PCP.  Once you have spoken to patient, you can close this encounter.   Thanks,  Star Age, MD, PhD [facility])

## 2017-07-21 NOTE — Patient Instructions (Signed)
1. Call if you have persistent rectal bleeding.  2. See you back in one year or sooner if needed.

## 2017-07-21 NOTE — Assessment & Plan Note (Signed)
Doing well at this time. Reinforced anti-reflex measures. We'll see him back in a year.

## 2017-07-21 NOTE — Telephone Encounter (Signed)
I called pt. I advised pt that Dr. Rexene Alberts reviewed pt's sleep study and found that pt did not show any significant osa but that he did not sleep very much during the study, only about 121 mins, and no REM sleep was achieved. Snoring was noted which ranged from mild to louder. Dr. Rexene Alberts recommends that pt consider an oral appliance, made by a qualified dentist, to treat snoring. I reviewed sleep hygiene recommendations with the pt, including trying to keep a regular sleep wake schedule, avoiding electronics in the bedroom, keeping the bedroom cool, dark, and quiet, and avoiding eating or exercising within 2 hours of bedtime as well as eating in the middle of the night. I advised pt to keep pets away from the bedtime. I discussed with pt the importance of stress relief and to try meditation, deep breathing exercises, and/or a white noise machine or fan to diffuse other noise distracters. I advised pt to not drink alcohol before bedtime and to never mix alcohol and sedating medications. Pt was advised to avoid narcotic pain medication close to bedtime. I advised pt that a copy of these sleep study results will be sent to Dr. Maudie Mercury. Pt verbalized understanding of results. Pt had no questions at this time but was encouraged to call back if questions arise.

## 2017-07-21 NOTE — Procedures (Signed)
PATIENT'S NAME:  Shawn [name], Shawn [name] DOB:      Sep 09, 1971      MR#:    [id number]     DATE OF RECORDING: 07/20/2017 REFERRING M.D.:  Soyla Dryer, PA-C Study Performed:   Baseline Polysomnogram HISTORY: 46 year old man with a history of hypertension, mood disorder, hypertension, hyperlipidemia, reflux disease, COPD and chronic lung disease with IgG4 related sclerosing disease, arthritis, anemia, chronic pain with prn chronic narcotic pain, who reports snoring and witnessed apneas, over the past year. The patient endorsed the Epworth Sleepiness Scale at 8 points. The patient's weight 186 pounds with a height of 71 (inches), resulting in a BMI of 25.9 kg/m2. The patient's neck circumference measured 15 inches.  CURRENT MEDICATIONS: Imuran, Zyrtec, Flexeril, Dexilant, Voltaren, Cymbalta, Prozac, Neurontin, Motrin, Lamictal, Mevacor, Lopressor, Zyprexa, Zofran, Zantac, Prednisone, Risperdal, Desyrel   PROCEDURE:  This is a multichannel digital polysomnogram utilizing the Somnostar 11.2 system.  Electrodes and sensors were applied and monitored per AASM Specifications.   EEG, EOG, Chin and Limb EMG, were sampled at 200 Hz.  ECG, Snore and Nasal Pressure, Thermal Airflow, Respiratory Effort, CPAP Flow and Pressure, Oximetry was sampled at 50 Hz. Digital video and audio were recorded.      BASELINE STUDY  Lights Out was at 22:40 and Lights On at 05:03.  Total recording time (TRT) was 383.5 minutes, with a total sleep time (TST) of  121 minutes.   The patient's sleep latency was 103 minutes, which is significantly delayed. REM sleep was absent. The sleep efficiency was 31.6 %, which is severely reduced.     SLEEP ARCHITECTURE: WASO (Wake after sleep onset) was 166 minutes with a very long period of wakefulness.  There were 20.5 minutes in Stage N1, 58.5 minutes Stage N2, 42 minutes Stage N3 and 0 minutes in Stage REM.  The percentage of Stage N1 was 16.9%, which is markedly increased, Stage N2 was 48.3%,  Stage N3 was 34.7%, which is markedly increased, and Stage R (REM sleep) was absent. The arousals were noted as: 20 were spontaneous, 3 were associated with PLMs, 0 were associated with respiratory events.    Audio and video analysis did not show any abnormal or unusual movements, behaviors, phonations or vocalizations. The patient took 1 bathroom break. Snoring was noted, ranging from mild to loud. The EKG was in keeping with normal sinus rhythm (NSR).  RESPIRATORY ANALYSIS:  There were a total of 0 respiratory events:  0 obstructive apneas, 0 central apneas and 0 mixed apneas with a total of 0 apneas and an apnea index (AI) of 0 /hour. There were 0 hypopneas with a hypopnea index of 0 /hour. The patient also had 0 respiratory event related arousals (RERAs).      The total APNEA/HYPOPNEA INDEX (AHI) was 0/hour and the total RESPIRATORY DISTURBANCE INDEX was 0 /hour.  0 events occurred in REM sleep and 0 events in NREM. The REM AHI was 0 /hour, versus a non-REM AHI of 0. The patient spent 0 minutes of total sleep time in the supine position and 121 minutes in non-supine.. The supine AHI was 0.0 versus a non-supine AHI of 0.0.  OXYGEN SATURATION & C02:  The Wake baseline 02 saturation was 96%, with the lowest being 89%. Time spent below 89% saturation equaled 0 minutes.  PERIODIC LIMB MOVEMENTS: The patient had a total of 85 Periodic Limb Movements.  The Periodic Limb Movement (PLM) index was 42.1 and the PLM Arousal index was 1.5/hour.  Post-study, the patient indicated that  sleep was the same as usual.   IMPRESSION:  1. Periodic Limb Movement Disorder (PLMD) 2. Primary Snoring 3. Dysfunctions associated with sleep stages or arousal from sleep  RECOMMENDATIONS:  1. This study was limited due to low sleep efficiency, total sleep time of only 2 hours and absence of REM sleep. As it stands, this study does not demonstrate any significant obstructive or central sleep disordered breathing, no  significant desaturations. Snoring was noted, ranging from mild to louder. For disturbing snoring, an oral appliance (through a qualified dentist) can be considered.  2. Moderate PLMs (periodic limb movements of sleep) were noted the study without significant arousals; clinical correlation is recommended. Medication effect from the patient's antidepressant should be considered.  3. This study shows sleep fragmentation and abnormal sleep stage percentages; these are nonspecific findings and per se do not signify an intrinsic sleep disorder or a cause for the patient's sleep-related symptoms. Causes include (but are not limited to) the first night effect of the sleep study, circadian rhythm disturbances, medication effect or an underlying mood disorder or medical problem.  4. The patient should be cautioned not to drive, work at heights, or operate dangerous or heavy equipment when tired or sleepy. Review and reiteration of good sleep hygiene measures should be pursued with any patient. 5. The patient will be advised to follow-up with is primary care provider, who will be notified of the test results.  I certify that I have reviewed the entire raw data recording prior to the issuance of this report in accordance with the Standards of Accreditation of the American Academy of Sleep Medicine (AASM)    Star Age, MD, PhD Diplomat, American Board of Psychiatry and Neurology (Neurology and Sleep Medicine)

## 2017-07-21 NOTE — Progress Notes (Signed)
Primary Care Physician: Jani Gravel, MD  Primary Gastroenterologist:  Garfield Cornea, MD   Chief Complaint  Patient presents with  . Rectal Bleeding    x5 days, went to ER 07/19/17    HPI: Shawn [name] is a 46 y.o. male hereFor follow-up. Last seen in June. He has a history of diverticulitis. Last colonoscopy 01/19/2017 with diverticulosis, entire examined colon, single 8 mm polyp in the ascending colon which was a sessile serrated polyp/adenoma without dysplasia, internal hemorrhoids. Recommended 5 year follow-up colonoscopy.  States he hasn't had any problems with abdominal pain in the long time. Bowel function is normal. Recently had some rectal bleeding with BMs. Was seen in the ED on September 11. Rectal exam was heme positive stool. Hemoglobin normal. Prescribe Anusol suppositories but he's used only wants stating he cannot seem to get the wrapper open. Refuses to spend more money on medication. Denies rectal pain or rectal trauma.  Heartburn well-controlled on Dexilant.    Current Outpatient Prescriptions  Medication Sig Dispense Refill  . azaTHIOprine (IMURAN) 50 MG tablet Take 100 mg by mouth daily.    . cetirizine (ZYRTEC) 10 MG tablet Take 10 mg by mouth daily.    . Cyanocobalamin (VITAMIN B-12) 5000 MCG TBDP Take 5,000 mcg by mouth daily.    . cyclobenzaprine (FLEXERIL) 10 MG tablet Take 1 tablet (10 mg total) by mouth 3 (three) times daily. 20 tablet 0  . dexlansoprazole (DEXILANT) 60 MG capsule Take 1 capsule (60 mg total) by mouth daily. 30 capsule 5  . diclofenac (VOLTAREN) 75 MG EC tablet Take 75 mg by mouth 2 (two) times daily.     . DULoxetine (CYMBALTA) 60 MG capsule Take 60 mg by mouth 2 (two) times daily. Reported on 05/03/2016    . Elastic Bandages & Supports (LUMBAR BACK BRACE/SUPPORT PAD) MISC Wear as needed 1 each 0  . FLUoxetine (PROZAC) 40 MG capsule Take 40 mg by mouth daily.    Marland Kitchen gabapentin (NEURONTIN) 300 MG capsule Take 300 mg by mouth 3 (three)  times daily.     Marland Kitchen ibuprofen (ADVIL,MOTRIN) 800 MG tablet Take 1 tablet (800 mg total) by mouth 3 (three) times daily. 21 tablet 0  . lamoTRIgine (LAMICTAL) 150 MG tablet Take 150 mg by mouth 2 (two) times daily.     Marland Kitchen lovastatin (MEVACOR) 20 MG tablet Take 20 mg by mouth at bedtime.     . metoprolol tartrate (LOPRESSOR) 25 MG tablet Take 50 mg by mouth daily.     . Multiple Vitamin (MULTIVITAMIN) tablet Take 1 tablet by mouth daily.    Marland Kitchen OLANZapine (ZYPREXA) 10 MG tablet Take 10 mg by mouth at bedtime. For mood swings and help sleep and 5-10mg  as needed for agitation/paranoia    . ondansetron (ZOFRAN) 4 MG tablet TAKE ONE TABLET BY MOUTH EVERY 8 HOURS AS NEEDED FOR NAUSEA OR VOMITING 30 tablet 1  . oxyCODONE-acetaminophen (PERCOCET/ROXICET) 5-325 MG tablet Take by mouth every 6 (six) hours as needed for severe pain.    Marland Kitchen Potassium 99 MG TABS Take 1 tablet by mouth every morning.    . ranitidine (ZANTAC) 150 MG tablet TAKE 1 TABLET BY MOUTH ONCE DAILY BEFORE EVENING MEAL (Patient taking differently: TAKE 1 TABLET BY MOUTH ONCE DAILY BEFORE BREAKFAST) 30 tablet 5  . risperiDONE (RISPERDAL) 0.5 MG tablet Take 0.5 mg by mouth at bedtime.     . trazodone (DESYREL) 300 MG tablet Take 450 mg by mouth at bedtime.  No current facility-administered medications for this visit.     Allergies as of 07/21/2017  . (No Known Allergies)    ROS:  General: Negative for anorexia, weight loss, fever, chills, fatigue, weakness. ENT: Negative for hoarseness, difficulty swallowing , nasal congestion. CV: Negative for chest pain, angina, palpitations, dyspnea on exertion, peripheral edema.  Respiratory: Negative for dyspnea at rest, dyspnea on exertion, cough, sputum, wheezing.  GI: See history of present illness. GU:  Negative for dysuria, hematuria, urinary incontinence, urinary frequency, nocturnal urination.  Endo: Negative for unusual weight change.    Physical Examination:   BP 120/78   Pulse 81    Temp (!) 96.5 F (35.8 C) (Oral)   Ht 5\' 11"  (1.803 m)   Wt 184 lb 3.2 oz (83.6 kg)   BMI 25.69 kg/m   General: Well-nourished, well-developed in no acute distress.  Eyes: No icterus. Mouth: Oropharyngeal mucosa moist and pink , no lesions erythema or exudate. Lungs: Clear to auscultation bilaterally.  Heart: Regular rate and rhythm, no murmurs rubs or gallops.  Abdomen: Bowel sounds are normal, nontender, nondistended, no hepatosplenomegaly or masses, no abdominal bruits or hernia , no rebound or guarding.   Extremities: No lower extremity edema. No clubbing or deformities. Neuro: Alert and oriented x 4   Skin: Warm and dry, no jaundice.   Psych: Alert and cooperative, normal mood and affect.  Labs:  Lab Results  Component Value Date   CREATININE 0.91 07/19/2017   BUN 14 07/19/2017   NA 137 07/19/2017   K 4.2 07/19/2017   CL 104 07/19/2017   CO2 25 07/19/2017   Lab Results  Component Value Date   WBC 5.9 07/19/2017   HGB 13.1 07/19/2017   HCT 38.3 (L) 07/19/2017   MCV 98.7 07/19/2017   PLT 237 07/19/2017   Lab Results  Component Value Date   ALT 18 07/19/2017   AST 18 07/19/2017   ALKPHOS 51 07/19/2017   BILITOT 0.3 07/19/2017    Imaging Studies: Dg Thoracic Spine W/swimmers  Result Date: 06/22/2017 CLINICAL DATA:  Chronic back pain/hx head injury 2005-pt states he fell head first EXAM: THORACIC SPINE - 3 VIEWS COMPARISON:  Thoracic CT, 08/06/2016. FINDINGS: No fracture, bone lesion or spondylolisthesis. Minor endplate spurring noted along the mid and lower thoracic spine. No other degenerative change. Soft tissues are unremarkable. IMPRESSION: 1. No fracture, spondylolisthesis or acute finding. 2. Minor mid lower thoracic spine disc degenerative change stable from the prior CT. Electronically Signed   By: Lajean Manes M.D.   On: 06/22/2017 13:05

## 2017-07-22 ENCOUNTER — Ambulatory Visit: Payer: Medicaid Other | Admitting: Nurse Practitioner

## 2017-07-25 NOTE — Progress Notes (Signed)
cc'ed to pcp °

## 2017-08-01 ENCOUNTER — Ambulatory Visit: Payer: Medicaid Other | Admitting: Physician Assistant

## 2017-08-09 ENCOUNTER — Ambulatory Visit (INDEPENDENT_AMBULATORY_CARE_PROVIDER_SITE_OTHER): Payer: Medicaid Other | Admitting: Urology

## 2017-08-09 DIAGNOSIS — R972 Elevated prostate specific antigen [PSA]: Secondary | ICD-10-CM | POA: Diagnosis not present

## 2017-09-15 ENCOUNTER — Other Ambulatory Visit (HOSPITAL_COMMUNITY): Payer: Self-pay | Admitting: Internal Medicine

## 2017-09-15 ENCOUNTER — Other Ambulatory Visit (HOSPITAL_COMMUNITY): Payer: Self-pay | Admitting: Nurse Practitioner

## 2017-09-15 ENCOUNTER — Ambulatory Visit (HOSPITAL_COMMUNITY)
Admission: RE | Admit: 2017-09-15 | Discharge: 2017-09-15 | Disposition: A | Discharge status: Home / Self Care | Payer: Medicaid Other | Source: Ambulatory Visit | Attending: Internal Medicine | Admitting: Internal Medicine

## 2017-09-15 DIAGNOSIS — R52 Pain, unspecified: Secondary | ICD-10-CM

## 2017-09-15 DIAGNOSIS — M545 Low back pain: Secondary | ICD-10-CM | POA: Insufficient documentation

## 2018-01-18 ENCOUNTER — Emergency Department (HOSPITAL_COMMUNITY): Payer: Medicaid Other

## 2018-01-18 ENCOUNTER — Other Ambulatory Visit: Payer: Self-pay

## 2018-01-18 ENCOUNTER — Encounter (HOSPITAL_COMMUNITY): Payer: Self-pay | Admitting: *Deleted

## 2018-01-18 ENCOUNTER — Emergency Department (HOSPITAL_COMMUNITY)
Admission: EM | Admit: 2018-01-18 | Discharge: 2018-01-18 | Disposition: A | Discharge status: Home / Self Care | Payer: Medicaid Other | Attending: Emergency Medicine | Admitting: Emergency Medicine

## 2018-01-18 DIAGNOSIS — Z79899 Other long term (current) drug therapy: Secondary | ICD-10-CM | POA: Diagnosis not present

## 2018-01-18 DIAGNOSIS — I509 Heart failure, unspecified: Secondary | ICD-10-CM | POA: Insufficient documentation

## 2018-01-18 DIAGNOSIS — Z87891 Personal history of nicotine dependence: Secondary | ICD-10-CM | POA: Diagnosis not present

## 2018-01-18 DIAGNOSIS — I110 Hypertensive heart disease with heart failure: Secondary | ICD-10-CM | POA: Diagnosis not present

## 2018-01-18 DIAGNOSIS — J449 Chronic obstructive pulmonary disease, unspecified: Secondary | ICD-10-CM | POA: Insufficient documentation

## 2018-01-18 DIAGNOSIS — R0602 Shortness of breath: Secondary | ICD-10-CM | POA: Insufficient documentation

## 2018-01-18 LAB — BASIC METABOLIC PANEL
Anion gap: 9 (ref 5–15)
BUN: 12 mg/dL (ref 6–20)
CHLORIDE: 104 mmol/L (ref 101–111)
CO2: 27 mmol/L (ref 22–32)
CREATININE: 0.82 mg/dL (ref 0.61–1.24)
Calcium: 9.6 mg/dL (ref 8.9–10.3)
GFR calc non Af Amer: >60 mL/min (ref >60)
Glucose, Bld: 96 mg/dL (ref 65–99)
POTASSIUM: 4 mmol/L (ref 3.5–5.1)
Sodium: 140 mmol/L (ref 135–145)

## 2018-01-18 LAB — CBC
HEMATOCRIT: 38 % — AB (ref 39.0–52.0)
HEMOGLOBIN: 12.8 g/dL — AB (ref 13.0–17.0)
MCH: 34.4 pg — ABNORMAL HIGH (ref 26.0–34.0)
MCHC: 33.7 g/dL (ref 30.0–36.0)
MCV: 102.2 fL — ABNORMAL HIGH (ref 78.0–100.0)
Platelets: 296 10*3/uL (ref 150–400)
RBC: 3.72 MIL/uL — AB (ref 4.22–5.81)
RDW: 15.6 % — ABNORMAL HIGH (ref 11.5–15.5)
WBC: 5.7 10*3/uL (ref 4.0–10.5)

## 2018-01-18 MED ORDER — IOPAMIDOL (ISOVUE-370) INJECTION 76%
100.0000 mL | Freq: Once | INTRAVENOUS | Status: AC | PRN
  Administered 2018-01-18: 100 mL via INTRAVENOUS

## 2018-01-18 NOTE — Discharge Instructions (Signed)
Contact [facility] for follow-up.  Perhaps they can see you before your appointment time on March 28.  Today's workup without any acute findings.  Report of the labs and the CT scan printed for you.  Return for any new or worse symptoms.

## 2018-01-18 NOTE — ED Notes (Signed)
Pt returned from ct

## 2018-01-18 NOTE — ED Provider Notes (Signed)
[facility] EMERGENCY DEPARTMENT Provider Note   CSN: [id number] Arrival date & time: 01/18/18  6761     History   Chief Complaint Chief Complaint  Patient presents with  . Shortness of Breath    HPI Shawn [name] is a 47 y.o. male.  Patient presents with shortness of breath.  Ongoing for 1-2 months or at least is been worse for 1-2 months.  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  Currently treated with Imuran.  He has follow-up with [facility] scheduled for March 28.  Last seen by them  and September.   patient denies chest pain.  Patient denies cough or flulike illness.  We will      Past Medical History:  Diagnosis Date  . Anemia   . Arthritis   . Back pain   . Bipolar 1 disorder ([place])   . Bipolar disorder ([place])   . 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 COPD (chronic obstructive pulmonary disease) ([place])   . Diverticulitis   . Family history of adverse reaction to anesthesia    he was adopted  . GERD (gastroesophageal reflux disease)   . Heart disease    "irregular heart beat"  . Heart murmur   . HOH (hard of hearing)    biological parents are both deaf.   left ear is good.  . Hyperlipidemia   . Hypertension   . 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 Lung nodules   . Schizoaffective disorder, bipolar type ([place])   . Vomiting     Patient Active Problem List   Diagnosis Date Noted  . Rectal bleeding 07/21/2017  . Abnormal CT scan, colon 11/17/2016  . Hx of adenomatous colonic polyps 11/17/2016  . Hypotension 04/23/2016  . Fever 04/23/2016  . Cough 04/23/2016  . Tick bites 04/23/2016  . Hypokalemia 04/23/2016  . 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 Multiple lung nodules on CT 01/23/2016  . 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 Schizoaffective disorder ([place]) 12/22/2015  . Environmental allergies 12/22/2015  . Arrhythmia 12/22/2015  . Hyperlipidemia 12/22/2015  . Multiple lung  nodules   . Chronic obstructive pulmonary disease ([place]) 11/17/2015  . Abdominal pain, periumbilical 95/07/3266  . Nausea with vomiting 07/03/2015  . Abnormal chest CT 07/03/2015  . Loose stools 10/30/2013  . Diverticulitis of colon (without mention of hemorrhage)(562.11) 10/01/2013  . GERD (gastroesophageal reflux disease) 10/01/2013    Past Surgical History:  Procedure Laterality Date  . BRAIN SURGERY  2005   after head injury, patient states had 2 large blood clots evacuated  . COLONOSCOPY WITH ESOPHAGOGASTRODUODENOSCOPY (EGD) N/A 12/05/2013   TIW:[facility] erosive relfux/HH/melanosis coli/colonic diverticulosis. tubulovillous adenoma removed. next tcs 11/2016  . COLONOSCOPY WITH PROPOFOL N/A 01/19/2017   Procedure: COLONOSCOPY WITH PROPOFOL;  Surgeon: Daneil Dolin, MD;  [facility]: [facility];  Service: Endoscopy;  Laterality: N/A;  8:30am  . FLEXIBLE BRONCHOSCOPY N/A 01/23/2016   Procedure: FLEXIBLE BRONCHOSCOPY;  Surgeon: Gaye Pollack, MD;  Location: [place];  Service: Thoracic;  Laterality: N/A;  . LUNG BIOPSY N/A 01/23/2016   Procedure: LEFT LUNG BIOPSY;  Surgeon: Gaye Pollack, MD;  Location: [place];  Service: Thoracic;  Laterality: N/A;  . POLYPECTOMY  01/19/2017   Procedure: POLYPECTOMY;  Surgeon: Daneil Dolin, MD;  Location: [facility];  Service: Endoscopy;;  ascending colon polyp  . VIDEO ASSISTED THORACOSCOPY Left 01/23/2016   Procedure: LEFT  VIDEO ASSISTED THORACOSCOPY;  Surgeon: Gaye Pollack, MD;  Location: [facility] OR;  Service: Thoracic;  Laterality: Left;       Home Medications    Prior to Admission medications   Medication Sig Start Date End Date Taking? Authorizing Provider  azaTHIOprine (IMURAN) 50 MG tablet Take 100 mg by mouth daily.   Yes [provider]  Cariprazine HCl (VRAYLAR) 4.5 MG CAPS Take 1 capsule by mouth at bedtime.   Yes [provider]  cetirizine (ZYRTEC) 10 MG tablet Take 10 mg by mouth daily.   Yes [provider]    Cyanocobalamin (VITAMIN B-12) 5000 MCG TBDP Take 5,000 mcg by mouth daily.   Yes [provider]  cyclobenzaprine (FLEXERIL) 10 MG tablet Take 1 tablet (10 mg total) by mouth 3 (three) times daily. 02/06/17  Yes Lily Kocher, PA-C  dexlansoprazole (DEXILANT) 60 MG capsule Take 1 capsule (60 mg total) by mouth daily. 01/10/17  Yes Soyla Dryer, PA-C  diclofenac (VOLTAREN) 75 MG EC tablet Take 75 mg by mouth 2 (two) times daily.    Yes [provider]  DULoxetine (CYMBALTA) 60 MG capsule Take 60 mg by mouth 2 (two) times daily. Reported on 05/03/2016   Yes [provider]  FLUoxetine (PROZAC) 40 MG capsule Take 40 mg by mouth daily.   Yes [provider]  gabapentin (NEURONTIN) 300 MG capsule Take 300 mg by mouth 3 (three) times daily.    Yes [provider]  ibuprofen (ADVIL,MOTRIN) 800 MG tablet Take 1 tablet (800 mg total) by mouth 3 (three) times daily. 02/06/17  Yes Lily Kocher, PA-C  lamoTRIgine (LAMICTAL) 150 MG tablet Take 150 mg by mouth 2 (two) times daily.    Yes [provider]  lovastatin (MEVACOR) 20 MG tablet Take 20 mg by mouth at bedtime.    Yes [provider]  metoprolol tartrate (LOPRESSOR) 25 MG tablet Take 50 mg by mouth daily.    Yes [provider]  Multiple Vitamin (MULTIVITAMIN) tablet Take 1 tablet by mouth daily.   Yes [provider]  oxyCODONE-acetaminophen (PERCOCET/ROXICET) 5-325 MG tablet Take by mouth every 6 (six) hours as needed for severe pain.   Yes [provider]  Potassium 99 MG TABS Take 1 tablet by mouth every morning.   Yes [provider]  pravastatin (PRAVACHOL) 40 MG tablet Take 1 tablet by mouth daily. 11/09/17  Yes [provider]  risperiDONE (RISPERDAL) 1 MG tablet Take 1 mg by mouth at bedtime.    Yes [provider]  trazodone (DESYREL) 300 MG tablet Take 450 mg by mouth at bedtime.    Yes [provider]  ondansetron  (ZOFRAN) 4 MG tablet TAKE ONE TABLET BY MOUTH EVERY 8 HOURS AS NEEDED FOR NAUSEA OR VOMITING Patient not taking: Reported on 01/18/2018 04/20/16   Carlis Stable, NP  ranitidine (ZANTAC) 150 MG tablet TAKE 1 TABLET BY MOUTH ONCE DAILY BEFORE EVENING MEAL Patient not taking: Reported on 01/18/2018 11/05/16   Annitta Needs, NP    Family History Family History  Adopted: Yes  Problem Relation Age of Onset  . Alzheimer's disease Other   . Parkinson's disease Other   . Mental illness Other   . Colon cancer Neg Hx        adopted at 25 months old, unsure about GI history of parents     Social History Social History   Tobacco Use  . Smoking status: Former Smoker    Packs/day:  0.75    Years: 25.00    Pack years: 18.75    Types: Cigars, Cigarettes    Last attempt to quit: 07/09/2014    Years since quitting: 3.5  . Smokeless tobacco: Former Systems developer    Types: Snuff  . Tobacco comment: pt uses dip tobacco  Substance Use Topics  . Alcohol use: Yes    Alcohol/week: 0.0 oz    Comment: occ  . Drug use: Not on file     Allergies   Patient has no known allergies.   Review of Systems Review of Systems  Constitutional: Negative for fever.  HENT: Negative for congestion.   Eyes: Negative for redness.  Respiratory: Positive for shortness of breath. Negative for wheezing.   Cardiovascular: Negative for chest pain.  Gastrointestinal: Negative for abdominal pain.  Genitourinary: Negative for dysuria.  Musculoskeletal: Negative for back pain and myalgias.  Skin: Negative for rash.  Allergic/Immunologic: Positive for immunocompromised state.  Neurological: Negative for headaches.  Hematological: Does not bruise/bleed easily.     Physical Exam Updated Vital Signs BP 112/78   Pulse (!) 58   Temp 98.6 F (37 C) (Oral)   Resp 17   Ht 1.778 m (5\' 10" )   Wt 79.4 kg (175 lb)   SpO2 96%   BMI 25.11 kg/m   Physical Exam  Constitutional: He is oriented to person, place, and time. He appears  well-developed and well-nourished. No distress.  HENT:  Head: Normocephalic and atraumatic.  Mouth/Throat: Oropharynx is clear and moist.  Eyes: EOM are normal. Pupils are equal, round, and reactive to light.  Neck: Neck supple.  Cardiovascular: Normal rate and regular rhythm.  Pulmonary/Chest: Effort normal and breath sounds normal. No respiratory distress. He has no wheezes. He has no rales.  Abdominal: Soft. Bowel sounds are normal. There is no tenderness.  Musculoskeletal: Normal range of motion. He exhibits no edema.  Neurological: He is alert and oriented to person, place, and time. No cranial nerve deficit or sensory deficit. He exhibits normal muscle tone. Coordination normal.  Skin: Skin is warm.  Nursing note and vitals reviewed.    ED Treatments / Results  Labs (all labs ordered are listed, but only abnormal results are displayed) Labs Reviewed  CBC - Abnormal; Notable for the following components:      Result Value   RBC 3.72 (*)    Hemoglobin 12.8 (*)    HCT 38.0 (*)    MCV 102.2 (*)    MCH 34.4 (*)    RDW 15.6 (*)    All other components within normal limits  BASIC METABOLIC PANEL    EKG  EKG Interpretation None       Radiology Dg Chest 2 View  Result Date: 01/18/2018 CLINICAL DATA:  Shortness of breath. EXAM: CHEST - 2 VIEW COMPARISON:  CT chest and chest radiograph 08/06/2016. FINDINGS: Trachea is midline. Heart size normal. Lungs are hyperinflated with postoperative scarring in the left perihilar region and left costophrenic angle. Lungs are otherwise clear. No pleural fluid. IMPRESSION: No acute findings. Electronically Signed   By: Lorin Picket M.D.   On: 01/18/2018 10:17   Ct Angio Chest Pe W/cm &/or Wo Cm  Result Date: 01/18/2018 CLINICAL DATA:  Dyspnea for years worsening over the past few days. EXAM: CT ANGIOGRAPHY CHEST WITH CONTRAST TECHNIQUE: Multidetector CT imaging of the chest was performed using the standard protocol during bolus  administration of intravenous contrast. Multiplanar CT image reconstructions and MIPs were obtained to evaluate the vascular  anatomy. CONTRAST:  133mL ISOVUE-370 IOPAMIDOL (ISOVUE-370) INJECTION 76% COMPARISON:  02/20/2016 chest CT, CXR 01/18/2018 FINDINGS: Cardiovascular: The study is of quality for the evaluation of pulmonary embolism. There are no filling defects in the central, lobar, segmental or subsegmental pulmonary artery branches to suggest acute pulmonary embolism. Great vessels are normal in course and caliber. Normal heart size. No significant pericardial fluid/thickening. Mediastinum/Nodes: No discrete thyroid nodules. Unremarkable esophagus. No mediastinal adenopathy. Mild bilateral hilar lymphadenopathy hip to 12 mm short axis. Lungs/Pleura: Mild peribronchial thickening noted bilaterally. Small acinar opacities are seen in the right middle and lower lobes with faint tree-in-bud opacities in the right lower lobe, series 6/86 as well as left upper lobe, series 6/54. 5.6 mm in average ovoid nodular opacity in the left lower lobe, series 6/84. A few small 4 mm or less subpleural nodular opacities are seen in the both upper lobes. Scarring and/or atelectasis in the left upper lobe and lingula. A few small pulmonary cysts are noted in the right lower lobe. Upper abdomen: Small hiatal hernia.  Otherwise unremarkable. Musculoskeletal:  No aggressive appearing focal osseous lesions. Review of the MIP images confirms the above findings. IMPRESSION: 1. Negative for pulmonary embolism. 2. Mild peribronchial thickening tiny tree-in-bud opacities compatible with bronchitic and bronchiolitic change. 3. Scattered tiny nodular opacities within both lungs as previously noted. 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 Electronically Signed   By: Ashley Royalty M.D.   On: 01/18/2018 16:34    Procedures Procedures (including critical care time)  Medications Ordered in ED Medications    iopamidol (ISOVUE-370) 76 % injection 100 mL (100 mLs Intravenous Contrast Given 01/18/18 1611)     Initial Impression / Assessment and Plan / ED Course  I have reviewed the triage vital signs and the nursing notes.  Pertinent labs & imaging results that were available during my care of the patient were reviewed by me and considered in my medical decision making (see chart for details).     Patient initial workup here was to ambulate him with pulse ox.  Sats remained very normal.  Patient still is concerned about his feeling of shortness of breath which is increased with exertion.  CT angios chest was done basic labs were done without any acute abnormalities.  Patient now feels comfortable with follow-up with [facility].  Patient nontoxic no acute distress.  Patient without any chest pain at all.  Again worsening shortness of breath is been present for 1-2 months.  Final Clinical Impressions(s) / ED Diagnoses   Final diagnoses:  SOB (shortness of breath)    ED Discharge Orders    None       Fredia Sorrow, MD 01/18/18 [phone]

## 2018-01-18 NOTE — ED Notes (Signed)
Pt ambulated around nurses station with pulse oximetry. O2 sat remained at 96% during ambulation. Pt stated that he felt okay walking around but states he becomes short of breath upon exercise and at night time. No acute distress noted during ambulation.

## 2018-01-18 NOTE — ED Triage Notes (Signed)
Short of breath with a history of lung disease

## 2018-05-12 ENCOUNTER — Telehealth: Payer: Self-pay

## 2018-05-12 NOTE — Telephone Encounter (Signed)
I've added him to a cancellation list and mailed him an appt card

## 2018-05-12 NOTE — Telephone Encounter (Signed)
Pt called with c/o lower stomach pain x 1 week, bowel movements at least 3-4 times which ends in loose stool, some nausea that comes and goes. Pt takes ibuprofen and Gabapentin for his neck pain and has taken Zofran for nausea. Pt feels Zofran isn't helping the nausea. Pt would like a appointment as soon as he can get it. Pt was notified that he will be added to the cancellation list.   Manuela Schwartz, please add pt to cancellation list.

## 2018-05-15 ENCOUNTER — Encounter (HOSPITAL_COMMUNITY): Payer: Self-pay | Admitting: Emergency Medicine

## 2018-05-15 ENCOUNTER — Emergency Department (HOSPITAL_COMMUNITY)
Admission: EM | Admit: 2018-05-15 | Discharge: 2018-05-15 | Disposition: A | Discharge status: Home / Self Care | Payer: Medicaid Other | Attending: Emergency Medicine | Admitting: Emergency Medicine

## 2018-05-15 ENCOUNTER — Emergency Department (HOSPITAL_COMMUNITY): Payer: Medicaid Other

## 2018-05-15 DIAGNOSIS — R0600 Dyspnea, unspecified: Secondary | ICD-10-CM | POA: Diagnosis not present

## 2018-05-15 DIAGNOSIS — M545 Low back pain, unspecified: Secondary | ICD-10-CM

## 2018-05-15 DIAGNOSIS — J449 Chronic obstructive pulmonary disease, unspecified: Secondary | ICD-10-CM | POA: Insufficient documentation

## 2018-05-15 DIAGNOSIS — I10 Essential (primary) hypertension: Secondary | ICD-10-CM | POA: Insufficient documentation

## 2018-05-15 DIAGNOSIS — G8929 Other chronic pain: Secondary | ICD-10-CM

## 2018-05-15 DIAGNOSIS — Z79899 Other long term (current) drug therapy: Secondary | ICD-10-CM | POA: Diagnosis not present

## 2018-05-15 DIAGNOSIS — E785 Hyperlipidemia, unspecified: Secondary | ICD-10-CM | POA: Diagnosis not present

## 2018-05-15 DIAGNOSIS — Z87891 Personal history of nicotine dependence: Secondary | ICD-10-CM | POA: Insufficient documentation

## 2018-05-15 MED ORDER — KETOROLAC TROMETHAMINE 30 MG/ML IJ SOLN
30.0000 mg | Freq: Once | INTRAMUSCULAR | Status: AC
  Administered 2018-05-15: 30 mg via INTRAMUSCULAR
  Filled 2018-05-15: qty 1

## 2018-05-15 MED ORDER — CELECOXIB 200 MG PO CAPS: 200.0000 mg | ORAL_CAPSULE | Freq: Two times a day (BID) | ORAL | 0 refills | Status: DC

## 2018-05-15 NOTE — ED Triage Notes (Signed)
Pt initially presented stating he was short of breath.  However, when inquired about this, pt states he has had this problem for years and it is already being investigated by his pcp.  Has been having back pain for 2 weeks with no injury.  Also was initially very concerned about recent lab values he received on his MyChart account.  States he "did research" regarding what these meant and then began listing various rare disorders he was concerned about.  Reassured pt the lab values he was discussing are still considered normal although not in the green range.

## 2018-05-15 NOTE — ED Provider Notes (Signed)
[facility] EMERGENCY DEPARTMENT Provider Note   CSN: [id number] Arrival date & time: 05/15/18  5885     History   Chief Complaint Chief Complaint  Patient presents with  . Back Pain    HPI Shawn [name] is a 47 y.o. male.  Pt presents to the ED today with multiple chronic complaints.  He feels worsening sob.  He has igG4 mediated lung disease and is followed at the [facility].  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  Pt has had PFTs done before and after inhaled bronchodilators and they don't do anything to help.   The pt also c/o cyclic am vomiting.  He takes zofran which does not seem to help.  He sees [facility] for this problem.  Pt does not feel nauseous now.  He also c/o back and neck pain which has been chronic, but worse than normal.  The pt had some labs drawn at his last appt with his pulmonologist on 6/26.  He has a few questions about his labs.  No f/c.  No abdominal pain.  No cp.     Past Medical History:  Diagnosis Date  . Anemia   . Arthritis   . Back pain   . Bipolar 1 disorder ([place])   . Bipolar disorder ([place])   . 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 COPD (chronic obstructive pulmonary disease) ([place])   . Diverticulitis   . Family history of adverse reaction to anesthesia    he was adopted  . GERD (gastroesophageal reflux disease)   . Heart disease    "irregular heart beat"  . Heart murmur   . HOH (hard of hearing)    biological parents are both deaf.   left ear is good.  . Hyperlipidemia   . Hypertension   . 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 Lung nodules   . Schizoaffective disorder, bipolar type ([place])   . Vomiting     Patient Active Problem List   Diagnosis Date Noted  . Rectal bleeding 07/21/2017  . Abnormal CT scan, colon 11/17/2016  . Hx of adenomatous colonic polyps 11/17/2016  . Hypotension 04/23/2016  . Fever 04/23/2016  .  Cough 04/23/2016  . Tick bites 04/23/2016  . Hypokalemia 04/23/2016  . 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 Multiple lung nodules on CT 01/23/2016  . 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 Schizoaffective disorder ([place]) 12/22/2015  . Environmental allergies 12/22/2015  . Arrhythmia 12/22/2015  . Hyperlipidemia 12/22/2015  . Multiple lung nodules   . Chronic obstructive pulmonary disease ([place]) 11/17/2015  . Abdominal pain, periumbilical 02/77/4128  . Nausea with vomiting 07/03/2015  . Abnormal chest CT 07/03/2015  . Loose stools 10/30/2013  . Diverticulitis of colon (without mention of hemorrhage)(562.11) 10/01/2013  . GERD (gastroesophageal reflux disease) 10/01/2013    Past Surgical History:  Procedure Laterality Date  . BRAIN SURGERY  2005   after head injury, patient states had 2 large blood clots evacuated  . COLONOSCOPY WITH ESOPHAGOGASTRODUODENOSCOPY (EGD) N/A 12/05/2013   NOM:VEHM erosive relfux/HH/melanosis coli/colonic diverticulosis. tubulovillous adenoma removed. next tcs 11/2016  . COLONOSCOPY WITH PROPOFOL N/A 01/19/2017   Procedure: COLONOSCOPY WITH PROPOFOL;  Surgeon: Daneil Dolin, MD;  [facility];  Service: Endoscopy;  Laterality: N/A;  8:30am  . FLEXIBLE BRONCHOSCOPY N/A 01/23/2016   Procedure: FLEXIBLE BRONCHOSCOPY;  Surgeon: Gaye Pollack,  MD;  Location: [place];  Service: Thoracic;  Laterality: N/A;  . LUNG BIOPSY N/A 01/23/2016   Procedure: LEFT LUNG BIOPSY;  Surgeon: Gaye Pollack, MD;  Location: [place];  Service: Thoracic;  Laterality: N/A;  . POLYPECTOMY  01/19/2017   Procedure: POLYPECTOMY;  Surgeon: Daneil Dolin, MD;  Location[facility];  Service: Endoscopy;;  ascending colon polyp  . VIDEO ASSISTED THORACOSCOPY Left 01/23/2016   Procedure: LEFT VIDEO ASSISTED THORACOSCOPY;  Surgeon: Gaye Pollack, MD;  Location: [facility] OR;  Service: Thoracic;  Laterality: Left;        Home Medications    Prior to Admission medications     Medication Sig Start Date End Date Taking? Authorizing Provider  azaTHIOprine (IMURAN) 50 MG tablet Take 100 mg by mouth daily.    [provider]  Cariprazine HCl (VRAYLAR) 4.5 MG CAPS Take 1 capsule by mouth at bedtime.    [provider]  celecoxib (CELEBREX) 200 MG capsule Take 1 capsule (200 mg total) by mouth 2 (two) times daily. 05/15/18   Isla Pence, MD  cetirizine (ZYRTEC) 10 MG tablet Take 10 mg by mouth daily.    [provider]  Cyanocobalamin (VITAMIN B-12) 5000 MCG TBDP Take 5,000 mcg by mouth daily.    [provider]  cyclobenzaprine (FLEXERIL) 10 MG tablet Take 1 tablet (10 mg total) by mouth 3 (three) times daily. 02/06/17   Lily Kocher, PA-C  dexlansoprazole (DEXILANT) 60 MG capsule Take 1 capsule (60 mg total) by mouth daily. 01/10/17   Soyla Dryer, PA-C  DULoxetine (CYMBALTA) 60 MG capsule Take 60 mg by mouth 2 (two) times daily. Reported on 05/03/2016    [provider]  FLUoxetine (PROZAC) 40 MG capsule Take 40 mg by mouth daily.    [provider]  gabapentin (NEURONTIN) 300 MG capsule Take 300 mg by mouth 3 (three) times daily.     [provider]  lamoTRIgine (LAMICTAL) 150 MG tablet Take 150 mg by mouth 2 (two) times daily.     [provider]  lovastatin (MEVACOR) 20 MG tablet Take 20 mg by mouth at bedtime.     [provider]  metoprolol tartrate (LOPRESSOR) 25 MG tablet Take 50 mg by mouth daily.     [provider]  Multiple Vitamin (MULTIVITAMIN) tablet Take 1 tablet by mouth daily.    [provider]  ondansetron (ZOFRAN) 4 MG tablet TAKE ONE TABLET BY MOUTH EVERY 8 HOURS AS NEEDED FOR NAUSEA OR VOMITING Patient not taking: Reported on 01/18/2018 04/20/16   Carlis Stable, NP  oxyCODONE-acetaminophen (PERCOCET/ROXICET) 5-325 MG tablet Take by mouth every 6 (six) hours as needed for severe pain.    [provider]  Potassium 99 MG TABS Take 1 tablet by  mouth every morning.    [provider]  pravastatin (PRAVACHOL) 40 MG tablet Take 1 tablet by mouth daily. 11/09/17   [provider]  ranitidine (ZANTAC) 150 MG tablet TAKE 1 TABLET BY MOUTH ONCE DAILY BEFORE EVENING MEAL Patient not taking: Reported on 01/18/2018 11/05/16   Annitta Needs, NP  risperiDONE (RISPERDAL) 1 MG tablet Take 1 mg by mouth at bedtime.     [provider]  trazodone (DESYREL) 300 MG tablet Take 450 mg by mouth at bedtime.     [provider]    Family History Family History  Adopted: Yes  Problem Relation Age of Onset  . Alzheimer's disease Other   . Parkinson's  disease Other   . Mental illness Other   . Colon cancer Neg Hx        adopted at 55 months old, unsure about GI history of parents     Social History Social History   Tobacco Use  . Smoking status: Former Smoker    Packs/day: 0.75    Years: 25.00    Pack years: 18.75    Types: Cigars, Cigarettes    Last attempt to quit: 07/09/2014    Years since quitting: 3.8  . Smokeless tobacco: Former Systems developer    Types: Snuff  . Tobacco comment: pt uses dip tobacco  Substance Use Topics  . Alcohol use: Yes    Alcohol/week: 0.0 oz    Comment: occ  . Drug use: Not on file     Allergies   Patient has no known allergies.   Review of Systems Review of Systems  Respiratory: Positive for shortness of breath.   Gastrointestinal: Positive for nausea.  Musculoskeletal: Positive for back pain and neck pain.  All other systems reviewed and are negative.    Physical Exam Updated Vital Signs BP 105/79   Pulse 66   Temp 98 F (36.7 C) (Oral)   Resp 16   Ht 5\' 6"  (1.676 m)   Wt 79.4 kg (175 lb)   SpO2 95%   BMI 28.25 kg/m   Physical Exam  Constitutional: He is oriented to person, place, and time. He appears well-developed and well-nourished.  HENT:  Head: Normocephalic and atraumatic.  Right Ear: External ear normal.  Left Ear: External ear normal.  Nose: Nose  normal.  Mouth/Throat: Oropharynx is clear and moist.  Eyes: Pupils are equal, round, and reactive to light. Conjunctivae and EOM are normal.  Neck: Normal range of motion. Neck supple. Muscular tenderness present.  Cardiovascular: Normal rate, regular rhythm, normal heart sounds and intact distal pulses.  Pulmonary/Chest: Effort normal and breath sounds normal.  Abdominal: Soft. Bowel sounds are normal.  Musculoskeletal:       Lumbar back: He exhibits tenderness.  Neurological: He is alert and oriented to person, place, and time.  Skin: Skin is warm. Capillary refill takes less than 2 seconds.  Psychiatric: He has a normal mood and affect. His behavior is normal. Judgment and thought content normal.  Nursing note and vitals reviewed.    ED Treatments / Results  Labs (all labs ordered are listed, but only abnormal results are displayed) Labs Reviewed - No data to display  EKG None  Radiology Dg Chest 2 View  Result Date: 05/15/2018 CLINICAL DATA:  Chronic shortness of breath.  IgG 4 deficiency EXAM: CHEST - 2 VIEW COMPARISON:  Chest radiograph and chest CT January 18, 2018 FINDINGS: Scarring noted in the left base with postoperative change. Elsewhere there is somewhat generalized interstitial thickening, stable. There is no consolidation or edema. Heart size and pulmonary vascularity are normal. No adenopathy. No bone lesions. IMPRESSION: Chronic interstitial thickening with scarring left base. No new opacity. No adenopathy. Heart size normal. Electronically Signed   By: Lowella Grip III M.D.   On: 05/15/2018 10:45   Dg Lumbar Spine Complete  Result Date: 05/15/2018 CLINICAL DATA:  Chronic lumbago with lower extremity numbness EXAM: LUMBAR SPINE - COMPLETE 4+ VIEW COMPARISON:  September 15, 2017 FINDINGS: Frontal, lateral, spot lumbosacral lateral, and bilateral oblique views were obtained. There are 5 non-rib-bearing lumbar type vertebral bodies. No fracture or spondylolisthesis.  Moderate disc space narrowing at L3-4 is stable. There is slightly milder disc  space narrowing at L2-3, stable. No new disc space narrowing evident. There is facet osteoarthritic change at L5-S1 bilaterally. IMPRESSION: Areas of relatively mild osteoarthritic change, stable. No fracture or spondylolisthesis. Electronically Signed   By: Lowella Grip III M.D.   On: 05/15/2018 10:46    Procedures Procedures (including critical care time)  Medications Ordered in ED Medications  ketorolac (TORADOL) 30 MG/ML injection 30 mg (30 mg Intramuscular Given 05/15/18 1008)     Initial Impression / Assessment and Plan / ED Course  I have reviewed the triage vital signs and the nursing notes.  Pertinent labs & imaging results that were available during my care of the patient were reviewed by me and considered in my medical decision making (see chart for details).    Pt's labs from 6/26 reviewed with pt.  His MCV is slightly high.  I told him to not worry about it and to discuss results with his pulmonologist.  Pt is instructed to call his pulmonologist to let her know that he is more sob.  No new pna on cxr.    Pt's back is feeling better after toradol.  I told pt to stop ibuprofen and voltaren and start celebrex.  Return if worse.  Final Clinical Impressions(s) / ED Diagnoses   Final diagnoses:  Chronic bilateral low back pain without sciatica  Dyspnea, unspecified type    ED Discharge Orders        Ordered    celecoxib (CELEBREX) 200 MG capsule  2 times daily     05/15/18 1105       Isla Pence, MD 05/15/18 1110

## 2018-05-15 NOTE — Telephone Encounter (Signed)
May use urgent ov spot.  Go to ED, urgent care, or pcp in interim if worsening symptoms.

## 2018-05-16 NOTE — Telephone Encounter (Signed)
Noted. Pt scheduled for 05/24/18 with [facility]. Pt will go to the ED if symptoms change or worsen.

## 2018-05-24 ENCOUNTER — Ambulatory Visit (INDEPENDENT_AMBULATORY_CARE_PROVIDER_SITE_OTHER): Payer: Medicaid Other | Admitting: Gastroenterology

## 2018-05-24 ENCOUNTER — Encounter: Payer: Self-pay | Admitting: Gastroenterology

## 2018-05-24 VITALS — BP 119/77 | HR 65 | Temp 97.9°F | Ht 70.0 in | Wt 183.2 lb

## 2018-05-24 DIAGNOSIS — R197 Diarrhea, unspecified: Secondary | ICD-10-CM | POA: Diagnosis not present

## 2018-05-24 DIAGNOSIS — R1030 Lower abdominal pain, unspecified: Secondary | ICD-10-CM | POA: Insufficient documentation

## 2018-05-24 DIAGNOSIS — D539 Nutritional anemia, unspecified: Secondary | ICD-10-CM | POA: Diagnosis not present

## 2018-05-24 MED ORDER — METRONIDAZOLE 500 MG PO TABS: 500.0000 mg | ORAL_TABLET | Freq: Three times a day (TID) | ORAL | 0 refills | Status: DC

## 2018-05-24 MED ORDER — CIPROFLOXACIN HCL 500 MG PO TABS: 500.0000 mg | ORAL_TABLET | Freq: Two times a day (BID) | ORAL | 0 refills | Status: DC

## 2018-05-24 NOTE — Progress Notes (Signed)
cc'ed to pcp °

## 2018-05-24 NOTE — Patient Instructions (Signed)
1. Please have your labs done at [place]. 2. Cipro and Flagyl for 10 days. Do not drink alcohol with Flagyl, it will cause vomiting.  3. Keep upcoming office visit in 08/2018.

## 2018-05-24 NOTE — Progress Notes (Signed)
Primary Care Physician: Celene Squibb, MD  Primary Gastroenterologist:  Garfield Cornea, MD   Chief Complaint  Patient presents with  . Abdominal Pain    lower mid abd  . Diarrhea    HPI: Shawn [name] is a 47 y.o. male here for urgent office visit for abdominal pain and diarrhea.  He has a history of diverticulitis.  He was last seen in September 2018.Last colonoscopy 01/19/2017 with diverticulosis, entire examined colon, single 8 mm polyp in the ascending colon which was a sessile serrated polyp/adenoma without dysplasia, internal hemorrhoids. Recommended 5 year follow-up colonoscopy.  For the past month he has had issues with his bowel habits.  He used to have 2 solid bowel movements per day.  Now he is having 3-4 [place] 5 to [place] 7 stools.  He worries about having incontinence because he does a lot of walking.  He has had associated lower abdominal pain.  No fever.  Antibiotic use for tick bite about 3 months ago.  No nausea or vomiting.  Heartburn is well controlled.  No melena or rectal bleeding.  No nocturnal diarrhea.  Only medication changes was the addition ofcariprazine at night.   4 months ago while in the ED his hemoglobin was slightly low at 12.8, hematocrit 38, MCV 102.2.  Recently labs at [facility], MCV was up to 104. Alpha gal 39.1H on 05/08/18. CMET ok.  Of note alpha gal was also elevated in June 2019 at 29.1 and then January 2018 at 29.3.  Over the past 4 weeks his stools have only minimally improved.  He wonders if it is related to alpha gal.  He is noted no problems with rash or hives, no swelling.  He consumes Facilities manager or saugage 3 times per week. Hamburger 3 times per week. Ham sandwiches frequently.  Patient states he discussed this with Dr. [place] Crane he did not provide any recommendations as far as food avoidance and no obvious reactions.    Current Outpatient Medications  Medication Sig Dispense Refill  . azaTHIOprine (IMURAN) 50 MG tablet Take 100 mg by mouth daily.     . Cariprazine HCl (VRAYLAR) 4.5 MG CAPS Take 1 capsule by mouth at bedtime.    . celecoxib (CELEBREX) 200 MG capsule Take 1 capsule (200 mg total) by mouth 2 (two) times daily. 60 capsule 0  . cetirizine (ZYRTEC) 10 MG tablet Take 10 mg by mouth daily.    . citalopram (CELEXA) 20 MG tablet Take 1 tablet by mouth daily.    . Cyanocobalamin (VITAMIN B-12) 5000 MCG TBDP Take 5,000 mcg by mouth daily.    . cyclobenzaprine (FLEXERIL) 10 MG tablet Take 1 tablet (10 mg total) by mouth 3 (three) times daily. 20 tablet 0  . dexlansoprazole (DEXILANT) 60 MG capsule Take 1 capsule (60 mg total) by mouth daily. 30 capsule 5  . diclofenac (VOLTAREN) 75 MG EC tablet Take 75 mg by mouth as needed.    . DULoxetine (CYMBALTA) 60 MG capsule Take 60 mg by mouth 2 (two) times daily. Reported on 05/03/2016    . FLUoxetine (PROZAC) 40 MG capsule Take 40 mg by mouth daily.    Marland Kitchen gabapentin (NEURONTIN) 300 MG capsule Take 300 mg by mouth 3 (three) times daily.     . hydrOXYzine (VISTARIL) 50 MG capsule Take 1 capsule by mouth at bedtime.    Marland Kitchen ibuprofen (ADVIL,MOTRIN) 800 MG tablet Take 800 mg by mouth every 8 (eight) hours as needed.    Marland Kitchen  lamoTRIgine (LAMICTAL) 150 MG tablet Take 150 mg by mouth 2 (two) times daily.     Marland Kitchen lovastatin (MEVACOR) 20 MG tablet Take 20 mg by mouth at bedtime.     . metFORMIN (GLUCOPHAGE) 500 MG tablet Take 500 mg by mouth daily.    . Multiple Vitamin (MULTIVITAMIN) tablet Take 1 tablet by mouth daily.    Marland Kitchen OLANZapine (ZYPREXA) 10 MG tablet Take 5-10 mg by mouth as needed.    . ondansetron (ZOFRAN) 4 MG tablet TAKE ONE TABLET BY MOUTH EVERY 8 HOURS AS NEEDED FOR NAUSEA OR VOMITING 30 tablet 1  . oxyCODONE-acetaminophen (PERCOCET/ROXICET) 5-325 MG tablet Take by mouth every 6 (six) hours as needed for severe pain.    Marland Kitchen Potassium 99 MG TABS Take 1 tablet by mouth every morning.    . pravastatin (PRAVACHOL) 40 MG tablet Take 1 tablet by mouth daily.  5  . risperiDONE (RISPERDAL) 1 MG tablet  Take 1 mg by mouth at bedtime.     . trazodone (DESYREL) 300 MG tablet Take 450 mg by mouth at bedtime.      No current facility-administered medications for this visit.     Allergies as of 05/24/2018  . (No Known Allergies)    ROS:  General: Negative for anorexia, weight loss, fever, chills, fatigue, weakness. ENT: Negative for hoarseness, difficulty swallowing , nasal congestion. CV: Negative for chest pain, angina, palpitations, dyspnea on exertion, peripheral edema.  Respiratory: Negative for dyspnea at rest, dyspnea on exertion, cough, sputum, wheezing.  GI: See history of present illness. GU:  Negative for dysuria, hematuria, urinary incontinence, urinary frequency, nocturnal urination.  Endo: Negative for unusual weight change.    Physical Examination:   BP 119/77   Pulse 65   Temp 97.9 F (36.6 C) (Oral)   Ht 5\' 10"  (1.778 m)   Wt 183 lb 3.2 oz (83.1 kg)   BMI 26.29 kg/m   General: Well-nourished, well-developed in no acute distress.  Eyes: No icterus. Mouth: Oropharyngeal mucosa moist and pink , no lesions erythema or exudate. Lungs: Clear to auscultation bilaterally.  Heart: Regular rate and rhythm, no murmurs rubs or gallops.  Abdomen: Bowel sounds are normal, minimal low abd tenderness, nondistended, no hepatosplenomegaly or masses, no abdominal bruits or hernia , no rebound or guarding.   Extremities: No lower extremity edema. No clubbing or deformities. Neuro: Alert and oriented x 4   Skin: Warm and dry, no jaundice.   Psych: Alert and cooperative, normal mood and affect.  Labs:  Lab Results  Component Value Date   CREATININE 0.82 01/18/2018   BUN 12 01/18/2018   NA 140 01/18/2018   K 4.0 01/18/2018   CL 104 01/18/2018   CO2 27 01/18/2018   Lab Results  Component Value Date   ALT 18 07/19/2017   AST 18 07/19/2017   ALKPHOS 51 07/19/2017   BILITOT 0.3 07/19/2017   Lab Results  Component Value Date   WBC 5.7 01/18/2018   HGB 12.8 (L)  01/18/2018   HCT 38.0 (L) 01/18/2018   MCV 102.2 (H) 01/18/2018   PLT 296 01/18/2018   No results found for: IRON, TIBC, FERRITIN No results found for: VITAMINB12  Imaging Studies: Dg Chest 2 View  Result Date: 05/15/2018 CLINICAL DATA:  Chronic shortness of breath.  IgG 4 deficiency EXAM: CHEST - 2 VIEW COMPARISON:  Chest radiograph and chest CT January 18, 2018 FINDINGS: Scarring noted in the left base with postoperative change. Elsewhere there is somewhat generalized interstitial  thickening, stable. There is no consolidation or edema. Heart size and pulmonary vascularity are normal. No adenopathy. No bone lesions. IMPRESSION: Chronic interstitial thickening with scarring left base. No new opacity. No adenopathy. Heart size normal. Electronically Signed   By: Lowella Grip III M.D.   On: 05/15/2018 10:45   Dg Lumbar Spine Complete  Result Date: 05/15/2018 CLINICAL DATA:  Chronic lumbago with lower extremity numbness EXAM: LUMBAR SPINE - COMPLETE 4+ VIEW COMPARISON:  September 15, 2017 FINDINGS: Frontal, lateral, spot lumbosacral lateral, and bilateral oblique views were obtained. There are 5 non-rib-bearing lumbar type vertebral bodies. No fracture or spondylolisthesis. Moderate disc space narrowing at L3-4 is stable. There is slightly milder disc space narrowing at L2-3, stable. No new disc space narrowing evident. There is facet osteoarthritic change at L5-S1 bilaterally. IMPRESSION: Areas of relatively mild osteoarthritic change, stable. No fracture or spondylolisthesis. Electronically Signed   By: Lowella Grip III M.D.   On: 05/15/2018 10:46

## 2018-05-24 NOTE — Assessment & Plan Note (Signed)
Change in bowel habits with increased frequency of stool, loose stool over the past 4 weeks.  Additional medication as outlined above.  Mild lower abdominal discomfort as well.  Colonoscopy up-to-date.  Antibiotics a few months ago.  Etiology unclear, possibly side effect of medications versus infectious etiology smoldering diverticulitis.  Discussed options which did include stool studies, versus empirical treatment with Cipro and Flagyl.  Patient prefers trying antibiotics first.  Cannot rule out possibility of symptoms related to alpha gal and reactions to consumption of beef, pork. However, appears he has had elevated antibodies for over one year and no other notable allergic type reaction. Encouraged him to watch for reactions carefully. Cannot rule out future anaphylactic type reaction. Patient not interested in food avoidance given lack of "symptoms". I did discuss Epipen for back up and he could discuss with PCP for their recommendations.

## 2018-05-24 NOTE — Assessment & Plan Note (Signed)
Patient admits to consuming 40 ounces of beer at one time, several times per week.  Occasionally 80 ounces.  Does not drink daily.  Likely etiology of macrocytosis.  We need to check for B12 and folate deficiency.  Encouraged decreased alcohol consumption.

## 2018-05-26 LAB — B12 AND FOLATE PANEL
FOLATE: 18.2 ng/mL (ref >3.0)
Vitamin B-12: 1012 pg/mL (ref 232–1245)

## 2018-06-05 ENCOUNTER — Other Ambulatory Visit: Payer: Self-pay

## 2018-06-05 ENCOUNTER — Telehealth: Payer: Self-pay

## 2018-06-05 DIAGNOSIS — R82998 Other abnormal findings in urine: Secondary | ICD-10-CM

## 2018-06-05 NOTE — Telephone Encounter (Signed)
Pt notified. Orders placed and released for [facility] per pts request.

## 2018-06-05 NOTE — Telephone Encounter (Signed)
Pt called to give update to Neil Crouch, PA.  He saw Magda Paganini in the office on 05/24/2018. He completed the Flagyl and Cipro yesterday.  He is not having as much diarrhea, still has 3-4 loose stools daily, but not watery. He is now urinating more brown urine and wondered if it came from the meds. No pain with urinating, but more frequent and had to get up twice last night to urinate. He has had some hot flashes and today vomiting a little.  Magda Paganini, please advise! ( Forwarding to [place] also, RMR pt).

## 2018-06-05 NOTE — Telephone Encounter (Signed)
Noted  

## 2018-06-05 NOTE — Telephone Encounter (Addendum)
He needs labs today. Brown urine could be sign of elevated bilirubin related to something going on with the liver.   -->Please have him get the following labs: CBC with diff CMET Lipase -->I would advise avoiding etoh.  -->I would also ask he avoid pork/beef to see if BMs improve as it may be related to allergies (+alpha gal which can cause allergies to beef/pork after tick bite). We discussed during his OV.  -->let him know his b12/folate were normal on recent labs we did.

## 2018-06-06 LAB — CBC WITH DIFFERENTIAL/PLATELET
BASOS ABS: 0 10*3/uL (ref 0.0–0.2)
BASOS: 0 %
EOS (ABSOLUTE): 0 10*3/uL (ref 0.0–0.4)
Eos: 0 %
Hematocrit: 43.9 % (ref 37.5–51.0)
Hemoglobin: 15.2 g/dL (ref 13.0–17.7)
IMMATURE GRANULOCYTES: 0 %
Immature Grans (Abs): 0 10*3/uL (ref 0.0–0.1)
LYMPHS: 18 %
Lymphocytes Absolute: 1.3 10*3/uL (ref 0.7–3.1)
MCH: 34 pg — ABNORMAL HIGH (ref 26.6–33.0)
MCHC: 34.6 g/dL (ref 31.5–35.7)
MCV: 98 fL — AB (ref 79–97)
MONOS ABS: 0.3 10*3/uL (ref 0.1–0.9)
Monocytes: 4 %
NEUTROS PCT: 78 %
Neutrophils Absolute: 5.7 10*3/uL (ref 1.4–7.0)
PLATELETS: 274 10*3/uL (ref 150–450)
RBC: 4.47 x10E6/uL (ref 4.14–5.80)
RDW: 13 % (ref 12.3–15.4)
WBC: 7.3 10*3/uL (ref 3.4–10.8)

## 2018-06-06 LAB — COMPREHENSIVE METABOLIC PANEL
A/G RATIO: 1.4 (ref 1.2–2.2)
ALK PHOS: 53 IU/L (ref 39–117)
ALT: 64 IU/L — AB (ref 0–44)
AST: 51 IU/L — AB (ref 0–40)
Albumin: 4.6 g/dL (ref 3.5–5.5)
BUN/Creatinine Ratio: 9 (ref 9–20)
BUN: 10 mg/dL (ref 6–24)
Bilirubin Total: 0.5 mg/dL (ref 0.0–1.2)
CHLORIDE: 104 mmol/L (ref 96–106)
CO2: 21 mmol/L (ref 20–29)
Calcium: 9.6 mg/dL (ref 8.7–10.2)
Creatinine, Ser: 1.13 mg/dL (ref 0.76–1.27)
GFR calc Af Amer: 89 mL/min/{1.73_m2} (ref >59)
GFR calc non Af Amer: 77 mL/min/{1.73_m2} (ref >59)
GLOBULIN, TOTAL: 3.2 g/dL (ref 1.5–4.5)
Glucose: 171 mg/dL — ABNORMAL HIGH (ref 65–99)
Potassium: 4.3 mmol/L (ref 3.5–5.2)
SODIUM: 143 mmol/L (ref 134–144)
Total Protein: 7.8 g/dL (ref 6.0–8.5)

## 2018-06-06 LAB — LIPASE: LIPASE: 33 U/L (ref 13–78)

## 2018-06-08 ENCOUNTER — Encounter: Payer: Self-pay | Admitting: Internal Medicine

## 2018-06-27 ENCOUNTER — Other Ambulatory Visit: Payer: Self-pay

## 2018-06-27 DIAGNOSIS — R7309 Other abnormal glucose: Secondary | ICD-10-CM

## 2018-06-28 ENCOUNTER — Other Ambulatory Visit: Payer: Self-pay

## 2018-06-28 DIAGNOSIS — R7309 Other abnormal glucose: Secondary | ICD-10-CM

## 2018-07-06 LAB — HEPATIC FUNCTION PANEL
ALBUMIN: 4.6 g/dL (ref 3.5–5.5)
ALK PHOS: 65 IU/L (ref 39–117)
ALT: 28 IU/L (ref 0–44)
AST: 24 IU/L (ref 0–40)
Bilirubin Total: 0.4 mg/dL (ref 0.0–1.2)
Bilirubin, Direct: 0.11 mg/dL (ref 0.00–0.40)
Total Protein: 8 g/dL (ref 6.0–8.5)

## 2018-07-06 LAB — HEPATITIS C ANTIBODY: Hep C Virus Ab: <0.1 s/co ratio (ref 0.0–0.9)

## 2018-07-06 LAB — HEPATITIS B SURFACE ANTIGEN: HEP B S AG: NEGATIVE

## 2018-08-15 ENCOUNTER — Ambulatory Visit: Payer: Medicaid Other | Admitting: Nurse Practitioner

## 2018-08-16 ENCOUNTER — Ambulatory Visit (INDEPENDENT_AMBULATORY_CARE_PROVIDER_SITE_OTHER): Payer: Medicaid Other | Admitting: Nurse Practitioner

## 2018-08-16 ENCOUNTER — Encounter: Payer: Self-pay | Admitting: Nurse Practitioner

## 2018-08-16 ENCOUNTER — Encounter

## 2018-08-16 VITALS — BP 120/80 | HR 72 | Temp 98.0°F | Ht 70.0 in | Wt 179.4 lb

## 2018-08-16 DIAGNOSIS — R945 Abnormal results of liver function studies: Secondary | ICD-10-CM

## 2018-08-16 DIAGNOSIS — R7989 Other specified abnormal findings of blood chemistry: Secondary | ICD-10-CM

## 2018-08-16 DIAGNOSIS — K219 Gastro-esophageal reflux disease without esophagitis: Secondary | ICD-10-CM | POA: Diagnosis not present

## 2018-08-16 DIAGNOSIS — R1033 Periumbilical pain: Secondary | ICD-10-CM | POA: Diagnosis not present

## 2018-08-16 MED ORDER — DEXLANSOPRAZOLE 60 MG PO CPDR: 60.0000 mg | DELAYED_RELEASE_CAPSULE | Freq: Every day | ORAL | 3 refills | Status: DC

## 2018-08-16 NOTE — Patient Instructions (Signed)
1. Continue current medications. 2. I sent a refill of Dexilant to your pharmacy. 3. Return for follow-up in 1 year. 4. Call us if you have any worsening or recurring symptoms. 5. Call us if you have any questions or concerns.  At [facility] we value your feedback. You may receive a survey about your visit today. Please share your experience as we strive to create trusting relationships with our patients to provide genuine, compassionate, quality care.  We appreciate your understanding and patience as we review any laboratory studies, imaging, and other diagnostic tests that are ordered as we care for you. Our office policy is 5 business days for review of these results, and any emergent or urgent results are addressed in a timely manner for your best interest. If you do not hear from our office in 1 week, please contact us.   We also encourage the use of MyChart, which contains your medical information for your review as well. If you are not enrolled in this feature, an access code is on this after visit summary for your convenience. Thank you for allowing [place] to be involved in your care.  It was great to meet you today!  I hope you have a great Fall!!

## 2018-08-16 NOTE — Assessment & Plan Note (Signed)
The patient had one episode of elevated LFTs which were mildly elevated in the 50s.  Presumed due to recent alcohol intake.  Repeat HFP found normal LFTs.  No need to recheck today.  Continue current medications and follow-up in 1 year.

## 2018-08-16 NOTE — Progress Notes (Signed)
cc'd to pcp 

## 2018-08-16 NOTE — Assessment & Plan Note (Signed)
GERD symptoms currently doing well on PPI.  He is requesting a refill of Dexilant which she will send to his pharmacy.  Return for follow-up in 1 year.  Call if any worsening or recurrent symptoms.

## 2018-08-16 NOTE — Assessment & Plan Note (Signed)
Abdominal pain has resolved after antibiotics.  Query smoldering or mild, uncomplicated diverticulitis.  Recommend he continue to monitor for any recurrent abdominal pain and let us know if it happens.  Otherwise follow-up in 1 year.  Call if any worsening symptoms.

## 2018-08-16 NOTE — Progress Notes (Signed)
Referring Provider: Celene Squibb, MD Primary Care Physician:  Celene Squibb, MD Primary GI:  Dr. Gala Romney   Chief Complaint  Patient presents with  . Anemia    HPI:   Shawn [name] is a 47 y.o. male who presents for follow-up on abdominal pain.  Patient was last seen in our office 05/24/2018 for diarrhea, lower abdominal pain, macrocytic anemia.  Noted history of diverticulitis.  Previous colonoscopy 01/19/2017 with diverticulosis in the entire examined colon, single 8 mm polyp in the ascending colon which was a sessile serrated polyp/adenoma without dysplasia, internal hemorrhoids.  Recommended 5-year repeat exam (12/28/2021).  At his last visit he noted issues with bowel habits with 2 solid bowel movements per day previously and now having 3-4 [place] 5-7 stools.  Concerns about incontinence.  Stools are associated with lower abdominal pain.  Antibiotic use about 3 months prior to to take bite.  Heartburn well controlled.  No other red flag/warning signs or symptoms.  Reviewed labs from [facility] which found hemoglobin of 12.8, MCV 102.2 which increased to 104.  Alpha gal elevated on multiple occasions at 39.1, 29.1, 29.3.  He wonders if his stool changes were due to alpha gal.  No swelling, rash, hives.  Consumes bacon or sausage 3 times a week and hamburger 3 times a week.  Ham sandwiches frequently.  No recommendations provided are primary care.  Recommended follow-up labs, Cipro and Flagyl for 10 days, follow-up in October 2019.  Labs completed 05/25/2018 found normal B12 and folate, normal lipase, CBC with normal hemoglobin and MCV 98.  CMP found mild transaminitis with AST/ALT at 51/64.  Recommended add on hepatitis viral serologies, HFP 4 weeks.  Labs found normalization of transaminases.  Hepatitis B and C-.  Recommended limiting or avoiding alcohol use.  Recommended at least no more than 24 ounces in a 24-hour period.  Today he states he's doing well overall. Abdominal pain resolved. He saw  PCP a month ago and was given Cipro/Flagyl which helped his abdominal pain. Stopped eating nuts/seeds and no further issues since. Currently pain free. Denies N/V, fever, chills, unintentional weight loss, hematochezia, melena. 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 Denies chest pain, dizziness, lightheadedness, syncope, near syncope. Denies any other upper or lower GI symptoms.  He wonders if he's developing a hernia due to caregiver for his ungle (250 lbs and pulls on him to lift and turn him). Discussed ergonomics and safety lifting.  Past Medical History:  Diagnosis Date  . Anemia   . Arthritis   . Back pain   . Bipolar 1 disorder ([place])   . Bipolar disorder ([place])   . 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 COPD (chronic obstructive pulmonary disease) ([employer])   . Diverticulitis   . Family history of adverse reaction to anesthesia    he was adopted  . GERD (gastroesophageal reflux disease)   . Heart disease    "irregular heart beat"  . Heart murmur   . HOH (hard of hearing)    biological parents are both deaf.   left ear is good.  . Hyperlipidemia   . Hypertension   . 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 Lung nodules   . Schizoaffective disorder, bipolar type ([place])   . Vomiting     Past Surgical History:  Procedure Laterality Date  . BRAIN SURGERY  2005   after head injury, patient states  had 2 large blood clots evacuated  . COLONOSCOPY WITH ESOPHAGOGASTRODUODENOSCOPY (EGD) N/A 12/05/2013   JSE:GBTD erosive relfux/HH/melanosis coli/colonic diverticulosis. tubulovillous adenoma removed. next tcs 11/2016  . COLONOSCOPY WITH PROPOFOL N/A 01/19/2017   Dr. Gala Romney: Diverticulosis, single 8 mm sessile serrated adenoma without dysplasia removed from the ascending colon, internal.  Next colonoscopy recommended in March 2023.  Marland Kitchen FLEXIBLE BRONCHOSCOPY N/A 01/23/2016   Procedure: FLEXIBLE BRONCHOSCOPY;  Surgeon: Gaye Pollack, MD;  Location: [facility];  Service: Thoracic;  Laterality: N/A;  . LUNG BIOPSY N/A 01/23/2016   Procedure: LEFT LUNG BIOPSY;  Surgeon: Gaye Pollack, MD;  Location: [facility];  Service: Thoracic;  Laterality: N/A;  . POLYPECTOMY  01/19/2017   Procedure: POLYPECTOMY;  Surgeon: Daneil Dolin, MD;  [facility];  Service: Endoscopy;;  ascending colon polyp  . VIDEO ASSISTED THORACOSCOPY Left 01/23/2016   Procedure: LEFT VIDEO ASSISTED THORACOSCOPY;  Surgeon: Gaye Pollack, MD;  Location: [facility] OR;  Service: Thoracic;  Laterality: Left;    Current Outpatient Medications  Medication Sig Dispense Refill  . azaTHIOprine (IMURAN) 50 MG tablet Take 100 mg by mouth daily.    . celecoxib (CELEBREX) 200 MG capsule Take 1 capsule (200 mg total) by mouth 2 (two) times daily. 60 capsule 0  . cetirizine (ZYRTEC) 10 MG tablet Take 10 mg by mouth daily.    . citalopram (CELEXA) 20 MG tablet Take 1 tablet by mouth daily.    . Cyanocobalamin (VITAMIN B-12) 5000 MCG TBDP Take 5,000 mcg by mouth daily.    . cyclobenzaprine (FLEXERIL) 10 MG tablet Take 1 tablet (10 mg total) by mouth 3 (three) times daily. 20 tablet 0  . dexlansoprazole (DEXILANT) 60 MG capsule Take 1 capsule (60 mg total) by mouth daily. 30 capsule 5  . diclofenac (VOLTAREN) 75 MG EC tablet Take 75 mg by mouth as needed.    . DULoxetine (CYMBALTA) 60 MG capsule Take 60 mg by mouth 2 (two) times daily. Reported on 05/03/2016    . FLUoxetine (PROZAC) 40 MG capsule Take 40 mg by mouth daily.    Marland Kitchen gabapentin (NEURONTIN) 300 MG capsule Take 300 mg by mouth 3 (three) times daily.     . hydrOXYzine (VISTARIL) 50 MG capsule Take 1 capsule by mouth at bedtime.    Marland Kitchen ibuprofen (ADVIL,MOTRIN) 800 MG tablet Take 800 mg by mouth every 8 (eight) hours as needed.    . lamoTRIgine (LAMICTAL) 150 MG tablet Take 150 mg by mouth 2 (two) times daily.     Marland Kitchen lovastatin (MEVACOR) 20 MG tablet Take 20 mg by mouth at bedtime.     . metFORMIN (GLUCOPHAGE) 500 MG tablet Take 500 mg by mouth  daily.    . Multiple Vitamin (MULTIVITAMIN) tablet Take 1 tablet by mouth daily.    . ondansetron (ZOFRAN) 4 MG tablet TAKE ONE TABLET BY MOUTH EVERY 8 HOURS AS NEEDED FOR NAUSEA OR VOMITING 30 tablet 1  . oxyCODONE-acetaminophen (PERCOCET/ROXICET) 5-325 MG tablet Take by mouth every 6 (six) hours as needed for severe pain.    Marland Kitchen Potassium 99 MG TABS Take 1 tablet by mouth every morning.    . pravastatin (PRAVACHOL) 40 MG tablet Take 1 tablet by mouth daily.  5  . trazodone (DESYREL) 300 MG tablet Take 450 mg by mouth at bedtime.      No current facility-administered medications for this visit.     Allergies as of 08/16/2018  . (No Known Allergies)    Family  History  Adopted: Yes  Problem Relation Age of Onset  . Alzheimer's disease Other   . Parkinson's disease Other   . Mental illness Other   . Colon cancer Neg Hx        adopted at 52 months old, unsure about GI history of parents     Social History   Socioeconomic History  . Marital status: Divorced    Spouse name: Not on file  . Number of children: 1  . Years of education: Not on file  . Highest education level: Not on file  Occupational History  . Occupation: disabled  Social Needs  . Financial resource strain: Not on file  . Food insecurity:    Worry: Not on file    Inability: Not on file  . Transportation needs:    Medical: Not on file    Non-medical: Not on file  Tobacco Use  . Smoking status: Former Smoker    Packs/day: 0.75    Years: 25.00    Pack years: 18.75    Types: Cigars, Cigarettes    Last attempt to quit: 07/09/2014    Years since quitting: 4.1  . Smokeless tobacco: Current User    Types: Snuff  . Tobacco comment: pt uses dip tobacco  Substance and Sexual Activity  . Alcohol use: Yes    Alcohol/week: 0.0 standard drinks    Comment: occ  . Drug use: Never  . Sexual activity: Not Currently    Birth control/protection: None  Lifestyle  . Physical activity:    Days per week: Not on file     Minutes per session: Not on file  . Stress: Not on file  Relationships  . Social connections:    Talks on phone: Not on file    Gets together: Not on file    Attends religious service: Not on file    Active member of club or organization: Not on file    Attends meetings of clubs or organizations: Not on file    Relationship status: Not on file  Other Topics Concern  . Not on file  Social History Narrative   He is originally from [place] [facility]. 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 Has always lived in [place]. Previously have traveled to [place], [place], [place], [place], [place]. Remote travel to [place]. 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 Lived in a house with mold around 2001-2002 as well as 2006. Has 2 dogs currently. No bird exposure. No hot tub exposure. Mainly walks for fun. He was incarcerated for 2 years previously. No known TB exposure. Had previous negative PPDs last around 2010. Never lived in a homeless shelter but has eaten at them before.     Review of Systems: General: Negative for anorexia, weight loss, fever, chills, fatigue, weakness. ENT: Negative for hoarseness, difficulty swallowing. CV: Negative for chest pain, angina, palpitations, peripheral edema.  Respiratory: Negative for dyspnea at rest, cough, sputum, wheezing.  GI: See history of present illness. Endo: Negative for unusual weight change.  Heme: Negative for bruising or bleeding.   Physical Exam: BP 120/80   Pulse 72   Temp 98 F (36.7 C) (Oral)   Ht 5\' 10"  (1.778 m)   Wt 179 lb 6.4 oz (81.4 kg)   BMI 25.74 kg/m  General:   Alert and oriented. Pleasant and cooperative. Well-nourished and well-developed.  Eyes:  Without icterus, sclera clear and conjunctiva pink.  Ears:  Normal auditory acuity. Cardiovascular:  S1, S2 present without murmurs  appreciated. Extremities without clubbing or edema. Respiratory:  Clear to auscultation bilaterally. No wheezes, rales, or rhonchi. No distress.    Gastrointestinal:  +BS, soft, non-tender and non-distended. No HSM noted. No guarding or rebound. No masses appreciated.  Rectal:  Deferred  Musculoskalatal:  Symmetrical without gross deformities. Neurologic:  Alert and oriented x4;  grossly normal neurologically. Psych:  Alert and cooperative. Normal mood and affect. Heme/Lymph/Immune: No excessive bruising noted.    08/16/2018 12:10 PM   Disclaimer: This note was dictated with voice recognition software. Similar sounding words can inadvertently be transcribed and may not be corrected upon review.

## 2018-10-17 ENCOUNTER — Telehealth: Payer: Self-pay | Admitting: Internal Medicine

## 2018-10-17 ENCOUNTER — Telehealth: Payer: Self-pay

## 2018-10-17 NOTE — Telephone Encounter (Signed)
Pt is aware that I've completed a PA and waiting on an approval or denial.

## 2018-10-17 NOTE — Telephone Encounter (Signed)
PA for Dexilant was submitted over the phone by [place] [employer]. Waiting on an approval or denial.

## 2018-10-17 NOTE — Telephone Encounter (Signed)
Pt said that his [facility] was going to fax up a PA for his [place]. He was saying with his insurance he would need a PA to continue getting refills. Please call him at [phone]

## 2018-11-23 ENCOUNTER — Other Ambulatory Visit: Payer: Self-pay

## 2018-11-23 ENCOUNTER — Encounter (HOSPITAL_COMMUNITY): Payer: Self-pay | Admitting: Emergency Medicine

## 2018-11-23 ENCOUNTER — Telehealth: Payer: Self-pay

## 2018-11-23 ENCOUNTER — Emergency Department (HOSPITAL_COMMUNITY): Payer: Medicaid Other

## 2018-11-23 ENCOUNTER — Emergency Department (HOSPITAL_COMMUNITY)
Admission: EM | Admit: 2018-11-23 | Discharge: 2018-11-23 | Disposition: A | Discharge status: Home / Self Care | Payer: Medicaid Other | Attending: Emergency Medicine | Admitting: Emergency Medicine

## 2018-11-23 DIAGNOSIS — R1110 Vomiting, unspecified: Secondary | ICD-10-CM | POA: Insufficient documentation

## 2018-11-23 DIAGNOSIS — I10 Essential (primary) hypertension: Secondary | ICD-10-CM | POA: Insufficient documentation

## 2018-11-23 DIAGNOSIS — K2100 Gastro-esophageal reflux disease with esophagitis, without bleeding: Secondary | ICD-10-CM

## 2018-11-23 DIAGNOSIS — Z79899 Other long term (current) drug therapy: Secondary | ICD-10-CM | POA: Insufficient documentation

## 2018-11-23 DIAGNOSIS — F17228 Nicotine dependence, chewing tobacco, with other nicotine-induced disorders: Secondary | ICD-10-CM | POA: Diagnosis not present

## 2018-11-23 DIAGNOSIS — R140 Abdominal distension (gaseous): Secondary | ICD-10-CM | POA: Insufficient documentation

## 2018-11-23 DIAGNOSIS — Z7902 Long term (current) use of antithrombotics/antiplatelets: Secondary | ICD-10-CM | POA: Diagnosis not present

## 2018-11-23 DIAGNOSIS — J449 Chronic obstructive pulmonary disease, unspecified: Secondary | ICD-10-CM | POA: Diagnosis not present

## 2018-11-23 DIAGNOSIS — R109 Unspecified abdominal pain: Secondary | ICD-10-CM | POA: Insufficient documentation

## 2018-11-23 DIAGNOSIS — Z7984 Long term (current) use of oral hypoglycemic drugs: Secondary | ICD-10-CM | POA: Insufficient documentation

## 2018-11-23 HISTORY — DX: Ulcer of esophagus without bleeding: K22.10

## 2018-11-23 HISTORY — DX: Diaphragmatic hernia without obstruction or gangrene: K44.9

## 2018-11-23 LAB — CBC WITH DIFFERENTIAL/PLATELET
ABS IMMATURE GRANULOCYTES: 0.02 10*3/uL (ref 0.00–0.07)
BASOS PCT: 1 %
Basophils Absolute: 0.1 10*3/uL (ref 0.0–0.1)
Eosinophils Absolute: 0.1 10*3/uL (ref 0.0–0.5)
Eosinophils Relative: 1 %
HCT: 43.1 % (ref 39.0–52.0)
HEMOGLOBIN: 14.2 g/dL (ref 13.0–17.0)
IMMATURE GRANULOCYTES: 0 %
LYMPHS PCT: 17 %
Lymphs Abs: 1.4 10*3/uL (ref 0.7–4.0)
MCH: 31.5 pg (ref 26.0–34.0)
MCHC: 32.9 g/dL (ref 30.0–36.0)
MCV: 95.6 fL (ref 80.0–100.0)
MONO ABS: 0.5 10*3/uL (ref 0.1–1.0)
MONOS PCT: 6 %
NEUTROS ABS: 6.4 10*3/uL (ref 1.7–7.7)
NEUTROS PCT: 75 %
PLATELETS: 218 10*3/uL (ref 150–400)
RBC: 4.51 MIL/uL (ref 4.22–5.81)
RDW: 12.5 % (ref 11.5–15.5)
WBC: 8.4 10*3/uL (ref 4.0–10.5)
nRBC: 0 % (ref 0.0–0.2)

## 2018-11-23 LAB — COMPREHENSIVE METABOLIC PANEL
ALBUMIN: 4.2 g/dL (ref 3.5–5.0)
ALT: 19 U/L (ref 0–44)
ANION GAP: 8 (ref 5–15)
AST: 23 U/L (ref 15–41)
Alkaline Phosphatase: 40 U/L (ref 38–126)
BILIRUBIN TOTAL: 0.7 mg/dL (ref 0.3–1.2)
BUN: 17 mg/dL (ref 6–20)
CHLORIDE: 107 mmol/L (ref 98–111)
CO2: 25 mmol/L (ref 22–32)
Calcium: 9.9 mg/dL (ref 8.9–10.3)
Creatinine, Ser: 1.01 mg/dL (ref 0.61–1.24)
GFR calc Af Amer: >60 mL/min (ref >60)
GFR calc non Af Amer: >60 mL/min (ref >60)
GLUCOSE: 98 mg/dL (ref 70–99)
POTASSIUM: 4.4 mmol/L (ref 3.5–5.1)
SODIUM: 140 mmol/L (ref 135–145)
TOTAL PROTEIN: 7.8 g/dL (ref 6.5–8.1)

## 2018-11-23 LAB — LIPASE, BLOOD: Lipase: 40 U/L (ref 11–51)

## 2018-11-23 MED ORDER — FAMOTIDINE 20 MG PO TABS: 20.0000 mg | ORAL_TABLET | Freq: Two times a day (BID) | ORAL | 0 refills | Status: DC

## 2018-11-23 MED ORDER — MAGNESIUM HYDROXIDE 400 MG/5ML PO SUSP
30.0000 mL | Freq: Once | ORAL | Status: AC
  Administered 2018-11-23: 30 mL via ORAL
  Filled 2018-11-23: qty 30

## 2018-11-23 MED ORDER — FAMOTIDINE 20 MG PO TABS
40.0000 mg | ORAL_TABLET | Freq: Once | ORAL | Status: AC
  Administered 2018-11-23: 40 mg via ORAL
  Filled 2018-11-23: qty 2

## 2018-11-23 MED ORDER — GI COCKTAIL ~~LOC~~: 30.0000 mL | Freq: Two times a day (BID) | ORAL | 1 refills | Status: DC

## 2018-11-23 NOTE — ED Notes (Signed)
Pt drinking coffee while in triage.

## 2018-11-23 NOTE — Discharge Instructions (Addendum)
Your x-ray is negative for any evidence of obstruction.  Your lab work does not show evidence of any systemic infection, or internal bleeding.  Your liver function, kidney function, and pancreas function seem to be within normal limits.  Your examination favors worsening of your esophagitis.  Please continue your Dexilant.  Please add a GI cocktail 2 times daily.  Use 20 mg of Pepcid with each meal and at bedtime.  Please call Dr. Gala Romney today or tomorrow and set up an appointment as soon as possible.

## 2018-11-23 NOTE — Telephone Encounter (Signed)
He has an appointment already scheduled in a couple weeks. Can we give him GERD dietary guidelines (avoid acidic, caustic, spicy foods; caffeine, coffee, etc)?

## 2018-11-23 NOTE — Telephone Encounter (Signed)
Pt walked in office around 4:00 PM stating he spoke with someone this morning and was scheduled an appointment. Pt was seen at the ED and asked to let our office know that he was treated for acid reflux. Pt had burning in his chest with vomiting. Pt asked that EG could review ED notes.

## 2018-11-23 NOTE — Telephone Encounter (Signed)
Spoke to pt and mailing pt [employer].

## 2018-11-23 NOTE — ED Triage Notes (Signed)
Pt c/o acid reflux x 2-3 days, pt c/o throat pain as well

## 2018-11-23 NOTE — ED Provider Notes (Signed)
[facility] EMERGENCY DEPARTMENT Provider Note   CSN: [id number] Arrival date & time: 11/23/18  1300     History   Chief Complaint Chief Complaint  Patient presents with  . Gastroesophageal Reflux    HPI Shawn [name] is a 48 y.o. male.  Patient is a 48 year old male who presents to the emergency department with a complaint of reflux disease.  The patient states that he has been treated for reflux problems over the last 10 to 15 years off and on.  He has been treated with different medications.  He is currently on Dexilant, but he says it really did not seem to be helping a lot.  Patient states that approximately 20 to 30 minutes after eating he is vomiting.  And he has a sensation in his esophagus area that he may have swallowed a balloon.  The patient states he is felt bloated a lot recently.  No high fever reported.  No blood in the vomitus.  No blood in the stool.  The patient is also tried Tums and drinking milk, but he states these are not helping either.  Patient has been seen in the past by Dr. Gala Romney for his GI issues.  He presents now for assistance because of his pain and discomfort.  The history is provided by the patient.  Gastroesophageal Reflux  Associated symptoms include abdominal pain. Pertinent negatives include no chest pain and no shortness of breath.    Past Medical History:  Diagnosis Date  . Anemia   . Arthritis   . Back pain   . Bipolar 1 disorder ([place])   . Bipolar disorder ([place])   . 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 COPD (chronic obstructive pulmonary disease) ([place])   . Diverticulitis   . Erosive esophagitis   . Family history of adverse reaction to anesthesia    he was adopted  . GERD (gastroesophageal reflux disease)   . Heart disease    "irregular heart beat"  . Heart murmur   . Hiatal hernia   . HOH (hard of hearing)    biological parents are both deaf.   left ear is good.  . Hyperlipidemia   .  Hypertension   . 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 Lung nodules   . Schizoaffective disorder, bipolar type ([place])   . Vomiting     Patient Active Problem List   Diagnosis Date Noted  . Elevated LFTs 08/16/2018  . Diarrhea 05/24/2018  . Lower abdominal pain 05/24/2018  . Macrocytic anemia 05/24/2018  . Rectal bleeding 07/21/2017  . Abnormal CT scan, colon 11/17/2016  . Hx of adenomatous colonic polyps 11/17/2016  . Hypotension 04/23/2016  . Fever 04/23/2016  . Cough 04/23/2016  . Tick bites 04/23/2016  . Hypokalemia 04/23/2016  . 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 Multiple lung nodules on CT 01/23/2016  . 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 Schizoaffective disorder ([place]) 12/22/2015  . Environmental allergies 12/22/2015  . Arrhythmia 12/22/2015  . Hyperlipidemia 12/22/2015  . Multiple lung nodules   . Chronic obstructive pulmonary disease ([place]) 11/17/2015  . Abdominal pain, periumbilical 36/64/4034  . Nausea with vomiting 07/03/2015  . Abnormal chest CT 07/03/2015  . Loose stools 10/30/2013  . Diverticulitis of colon (without mention of hemorrhage)(562.11) 10/01/2013  . GERD (gastroesophageal reflux disease) 10/01/2013    Past Surgical History:  Procedure Laterality Date  . BRAIN SURGERY  2005   after head injury,  patient states had 2 large blood clots evacuated  . COLONOSCOPY WITH ESOPHAGOGASTRODUODENOSCOPY (EGD) N/A 12/05/2013   GGY:IRSW erosive relfux/HH/melanosis coli/colonic diverticulosis. tubulovillous adenoma removed. next tcs 11/2016  . COLONOSCOPY WITH PROPOFOL N/A 01/19/2017   Dr. Gala Romney: Diverticulosis, single 8 mm sessile serrated adenoma without dysplasia removed from the ascending colon, internal.  Next colonoscopy recommended in March 2023.  Marland Kitchen FLEXIBLE BRONCHOSCOPY N/A 01/23/2016   Procedure: FLEXIBLE BRONCHOSCOPY;  Surgeon: Gaye Pollack, MD;  Location: [place];  Service: Thoracic;  Laterality: N/A;  . LUNG BIOPSY N/A 01/23/2016   Procedure: LEFT  LUNG BIOPSY;  Surgeon: Gaye Pollack, MD;  Location: [place];  Service: Thoracic;  Laterality: N/A;  . POLYPECTOMY  01/19/2017   Procedure: POLYPECTOMY;  Surgeon: Daneil Dolin, MD;  [facility]: [facility];  Service: Endoscopy;;  ascending colon polyp  . VIDEO ASSISTED THORACOSCOPY Left 01/23/2016   Procedure: LEFT VIDEO ASSISTED THORACOSCOPY;  Surgeon: Gaye Pollack, MD;  Location: [facility] OR;  Service: Thoracic;  Laterality: Left;        Home Medications    Prior to Admission medications   Medication Sig Start Date End Date Taking? Authorizing Provider  azaTHIOprine (IMURAN) 50 MG tablet Take 100 mg by mouth daily.    [provider]  celecoxib (CELEBREX) 200 MG capsule Take 1 capsule (200 mg total) by mouth 2 (two) times daily. 05/15/18   Isla Pence, MD  cetirizine (ZYRTEC) 10 MG tablet Take 10 mg by mouth daily.    [provider]  citalopram (CELEXA) 20 MG tablet Take 1 tablet by mouth daily. 05/01/18   [provider]  Cyanocobalamin (VITAMIN B-12) 5000 MCG TBDP Take 5,000 mcg by mouth daily.    [provider]  cyclobenzaprine (FLEXERIL) 10 MG tablet Take 1 tablet (10 mg total) by mouth 3 (three) times daily. 02/06/17   Lily Kocher, PA-C  dexlansoprazole (DEXILANT) 60 MG capsule Take 1 capsule (60 mg total) by mouth daily. 08/16/18   Carlis Stable, NP  diclofenac (VOLTAREN) 75 MG EC tablet Take 75 mg by mouth as needed.    [provider]  DULoxetine (CYMBALTA) 60 MG capsule Take 60 mg by mouth 2 (two) times daily. Reported on 05/03/2016    [provider]  FLUoxetine (PROZAC) 40 MG capsule Take 40 mg by mouth daily.    [provider]  gabapentin (NEURONTIN) 300 MG capsule Take 300 mg by mouth 3 (three) times daily.     [provider]  hydrOXYzine (VISTARIL) 50 MG capsule Take 1 capsule by mouth at bedtime. 04/20/18   [provider]  ibuprofen (ADVIL,MOTRIN) 800 MG tablet Take 800 mg by mouth every 8  (eight) hours as needed.    [provider]  lamoTRIgine (LAMICTAL) 150 MG tablet Take 150 mg by mouth 2 (two) times daily.     [provider]  lovastatin (MEVACOR) 20 MG tablet Take 20 mg by mouth at bedtime.     [provider]  metFORMIN (GLUCOPHAGE) 500 MG tablet Take 500 mg by mouth daily.    [provider]  Multiple Vitamin (MULTIVITAMIN) tablet Take 1 tablet by mouth daily.    [provider]  ondansetron (ZOFRAN) 4 MG tablet TAKE ONE TABLET BY MOUTH EVERY 8 HOURS AS NEEDED FOR NAUSEA OR VOMITING 04/20/16   Carlis Stable, NP  oxyCODONE-acetaminophen (PERCOCET/ROXICET) 5-325 MG tablet Take by mouth every 6 (six) hours as needed for severe pain.    [provider]  Potassium 99 MG TABS Take 1 tablet by mouth every morning.    [provider]  pravastatin (PRAVACHOL) 40 MG tablet Take 1 tablet by mouth daily. 11/09/17   [provider]  trazodone (DESYREL) 300 MG tablet Take 450 mg by mouth at bedtime.     [provider]    Family History Family History  Adopted: Yes  Problem Relation Age of Onset  . Alzheimer's disease Other   . Parkinson's disease Other   . Mental illness Other   . Colon cancer Neg Hx        adopted at 12 months old, unsure about GI history of parents     Social History Social History   Tobacco Use  . Smoking status: Former Smoker    Packs/day: 0.75    Years: 25.00    Pack years: 18.75    Types: Cigars, Cigarettes    Last attempt to quit: 07/09/2014    Years since quitting: 4.3  . Smokeless tobacco: Current User    Types: Snuff  . Tobacco comment: pt uses dip tobacco  Substance Use Topics  . Alcohol use: Yes    Alcohol/week: 0.0 standard drinks    Comment: occ  . Drug use: Never     Allergies   Patient has no known allergies.   Review of Systems Review of Systems  Constitutional: Negative for activity change, chills and fever.       All ROS Neg except as noted in  HPI  HENT: Negative for nosebleeds.   Eyes: Negative for photophobia and discharge.  Respiratory: Negative for cough, shortness of breath and wheezing.   Cardiovascular: Negative for chest pain and palpitations.  Gastrointestinal: Positive for abdominal pain, nausea and vomiting. Negative for blood in stool, constipation and diarrhea.  Genitourinary: Negative for dysuria, frequency and hematuria.  Musculoskeletal: Negative for arthralgias, back pain and neck pain.  Skin: Negative.   Neurological: Negative for dizziness, seizures and speech difficulty.  Psychiatric/Behavioral: Negative for confusion and hallucinations.     Physical Exam Updated Vital Signs BP 115/80 (BP Location: Right Arm)   Pulse (!) 113   Temp (!) 97.3 F (36.3 C) (Temporal)   Resp 18   Ht 5\' 10"  (1.778 m)   Wt 86.2 kg   SpO2 97%   BMI 27.26 kg/m   Physical Exam Vitals signs and nursing note reviewed.  Constitutional:      Appearance: He is well-developed. He is not toxic-appearing.  HENT:     Head: Normocephalic.     Right Ear: Tympanic membrane and external ear normal.     Left Ear: Tympanic membrane and external ear normal.  Eyes:     General: Lids are normal.     Pupils: Pupils are equal, round, and reactive to light.  Neck:     Musculoskeletal: Normal range of motion and neck supple.     Vascular: No carotid bruit.  Cardiovascular:     Rate and Rhythm: Normal rate and regular rhythm.     Pulses: Normal pulses.     Heart sounds: Normal heart sounds.  Pulmonary:     Effort: No respiratory distress.     Breath sounds: Normal breath sounds.  Abdominal:     General: Bowel sounds are normal.     Palpations: Abdomen is soft.     Tenderness: There is no abdominal tenderness. There is no guarding.     Comments: There is mild epigastric area pain.  No other pain noted of the  abdomen.  No guarding appreciated.  Musculoskeletal: Normal range of motion.  Lymphadenopathy:     Head:     Right side of  head: No submandibular adenopathy.     Left side of head: No submandibular adenopathy.     Cervical: No cervical adenopathy.  Skin:    General: Skin is warm and dry.  Neurological:     Mental Status: He is alert and oriented to person, place, and time.     Cranial Nerves: No cranial nerve deficit.     Sensory: No sensory deficit.  Psychiatric:        Speech: Speech normal.      ED Treatments / Results  Labs (all labs ordered are listed, but only abnormal results are displayed) Labs Reviewed  COMPREHENSIVE METABOLIC PANEL  CBC WITH DIFFERENTIAL/PLATELET  LIPASE, BLOOD    EKG None  Radiology No results found.  Procedures Procedures (including critical care time)  Medications Ordered in ED Medications  magnesium hydroxide (MILK OF MAGNESIA) suspension 30 mL (has no administration in time range)  famotidine (PEPCID) tablet 40 mg (has no administration in time range)     Initial Impression / Assessment and Plan / ED Course  I have reviewed the triage vital signs and the nursing notes.  Pertinent labs & imaging results that were available during my care of the patient were reviewed by me and considered in my medical decision making (see chart for details).       Final Clinical Impressions(s) / ED Diagnoses MDM  Heart rate is elevated at 113.  Remainder the vital signs are within normal limits.  Pulse oximetry is 97% on room air.  Within normal limits by my interpretation.  Case discussed with Dr. Thurnell Garbe.  Review of records shows that the patient had an endoscopy about 5 years ago at which time he was noted to have erosive esophagitis.   Patient was drinking coffee in the triage area.  No vomiting noted since the patient has been here in the emergency department.  I have discussed the plan for blood work, a three-way abdomen, Pepcid and milk of magnesia for assistance with his discomfort with the patient.  Patient ambulatory to the bathroom without any problem  whatsoever.  Three-way abdomen is negative for obstruction or other problems.  Comprehensive metabolic panel, complete blood count, and lipase are all within normal limits.  I have discussed these findings with the patient in terms of which he understands.  I have asked the patient to see Dr. Gala Romney in the office as soon as possible for follow-up of the esophagitis.  Prescription for GI cocktail given to the patient.  Have asked the patient to use 20 mg of Pepcid with breakfast, lunch, and at bedtime.  Patient is in agreement with this plan.   Final diagnoses:  Gastroesophageal reflux disease with esophagitis    ED Discharge Orders         Ordered    Alum & Mag Hydroxide-Simeth (GI COCKTAIL) SUSP suspension  2 times daily     11/23/18 Morral, [place], PA-C 11/23/18 Verdi, Eva, DO 11/25/18 [phone]

## 2018-11-24 ENCOUNTER — Telehealth: Payer: Self-pay | Admitting: Internal Medicine

## 2018-11-24 DIAGNOSIS — K219 Gastro-esophageal reflux disease without esophagitis: Secondary | ICD-10-CM

## 2018-11-24 NOTE — Telephone Encounter (Signed)
Pepcid is covered under pts insurance. The GI cocktail isn't covered and the pt didn't know what he could take in place of it. Pt advised to take Pepcid as directed until he hears back from our office.

## 2018-11-24 NOTE — Telephone Encounter (Signed)
Pt has OV coming up on 2/4 due to heartburn/reflux. He called this morning to say that the ER gave him a GI cocktail that worked, but [employer] fill it because his insurance doesn't cover it. Is there something we can recommend to hold him until his OV? [phone]

## 2018-12-01 NOTE — Telephone Encounter (Signed)
I will send it viscous lidocaine to see if that helps. Let's check in with him Monday or Tuesday and see if it's helped. If not can try Carafate.  It looks like he was on Dexilant and did ok but needed a PA (which was filed). Have we heard back on that? We can give him samples of Dexilant x 2-3 boxes to help him in the interim,

## 2018-12-05 NOTE — Telephone Encounter (Signed)
EG pt said he got his Dexilant rx. Pt would like to try the Lidocaine or Carafate. He isn't sure if Medicaid will cover it but he is will to try it. Pt also has an appointment on 12/12/18.

## 2018-12-06 MED ORDER — LIDOCAINE VISCOUS HCL 2 % MT SOLN: 15.0000 mL | OROMUCOSAL | 1 refills | Status: DC | PRN

## 2018-12-06 NOTE — Addendum Note (Signed)
Addended by: Gordy Levan, Zhyon Antenucci A on: 12/06/2018 09:14 AM   Modules accepted: Orders

## 2018-12-06 NOTE — Telephone Encounter (Signed)
Left a detailed message. Pt is aware that RX was sent to pharmacy.

## 2018-12-06 NOTE — Telephone Encounter (Signed)
Sent, please notify the patient.

## 2018-12-12 ENCOUNTER — Other Ambulatory Visit: Payer: Self-pay

## 2018-12-12 ENCOUNTER — Encounter: Payer: Self-pay | Admitting: Gastroenterology

## 2018-12-12 ENCOUNTER — Ambulatory Visit (INDEPENDENT_AMBULATORY_CARE_PROVIDER_SITE_OTHER): Payer: Medicaid Other | Admitting: Gastroenterology

## 2018-12-12 ENCOUNTER — Telehealth: Payer: Self-pay

## 2018-12-12 VITALS — BP 130/81 | HR 84 | Temp 97.9°F | Ht 70.0 in | Wt 190.8 lb

## 2018-12-12 DIAGNOSIS — K2100 Gastro-esophageal reflux disease with esophagitis, without bleeding: Secondary | ICD-10-CM

## 2018-12-12 DIAGNOSIS — R1013 Epigastric pain: Secondary | ICD-10-CM

## 2018-12-12 DIAGNOSIS — R112 Nausea with vomiting, unspecified: Secondary | ICD-10-CM | POA: Diagnosis not present

## 2018-12-12 DIAGNOSIS — R1319 Other dysphagia: Secondary | ICD-10-CM | POA: Insufficient documentation

## 2018-12-12 DIAGNOSIS — R1310 Dysphagia, unspecified: Secondary | ICD-10-CM | POA: Diagnosis not present

## 2018-12-12 MED ORDER — SUCRALFATE 1 GM/10ML PO SUSP: 1.0000 g | Freq: Three times a day (TID) | ORAL | 0 refills | Status: DC

## 2018-12-12 NOTE — Telephone Encounter (Signed)
Called and informed pt of pre-op appt 01/29/19 at 11:00am. Letter mailed.

## 2018-12-12 NOTE — Patient Instructions (Signed)
1. Continue Dexilant and Pepcid as before. 2. Add Carafate 4 times daily for 10 days. 3. Upper endoscopy as scheduled.  Please see separate instructions.

## 2018-12-12 NOTE — Assessment & Plan Note (Addendum)
Pleasant 48 year old gentleman presenting with several week history of upper GI symptoms including refractory heartburn, frequent vomiting, epigastric discomfort,? dysphagia.  He is on Dexilant once daily, Pepcid 1-2 times daily.  Recently went to the ED for evaluation.  Work-up as outlined above. He was unable to get oral lidocaine solution filled, not covered by insurance.  Vomited twice this morning.  Symptoms not necessarily related to meals.  Can wake up vomiting.  Heartburn is the majority of the day.No weight loss.  Feels like food is sitting in the lower chest/epigastric region but not clear whether it is going all the way down.  Symptoms may be secondary to refractory GERD, gastritis, peptic ulcer disease.  He does take NSAIDs daily.  Add Carafate 4 times daily for 10 days.  Continue Dexilant daily, Pepcid twice daily.  Plan for upper endoscopy with deep sedation in the near future.  It is not clear whether he is having true esophageal dysphagia. If EGD does not explain his symptoms of "food sitting in my lower chest", consider esophageal dilation.  I have discussed the risks, alternatives, benefits with regards to but not limited to the risk of reaction to medication, bleeding, infection, perforation and the patient is agreeable to proceed. Written consent to be obtained.

## 2018-12-12 NOTE — Progress Notes (Signed)
Primary Care Physician: Celene Squibb, MD  Primary Gastroenterologist:  Garfield Cornea, MD   Chief Complaint  Patient presents with  . Gastroesophageal Reflux  . Emesis    not daily but had 2 episodes today    HPI: Shawn [name] is a 48 y.o. male here for further evaluation of refractory GERD, vomiting.  He was last seen in our office back in October 2019.  He is actually doing pretty well at that time.  Chronically on Dexilant for reflux.  Several weeks ago he started having refractory symptoms of heartburn, frequent vomiting, epigastric pain.  When he swallows food it seems to go down but it feels like it sitting in the lower chest/epigastric region.  Feels like he swallowed a balloon.  No hematemesis.  Bowel function regular.  No blood in the stool or melena.  No weight loss.  Weight is actually up about 10 pounds in the past 4 months.  Patient has a history of elevated alpha gal as previously outlined in office notes.  Has refused avoiding appropriate foods, consumes bacon, sausage, hamburger, ham sandwiches.  Advised him previously to follow-up with PCP for recommendations regarding possible EpiPen as he refuses to avoid foods.  Also with history of significant daily alcohol consumption although he reports cutting back at last office visit.  Last EGD in 2015 where he had mild erosive reflux esophagitis, hiatal hernia.  He is on Celebrex twice daily, Voltaren daily as needed, Advil 800 mg every 8 hours as needed   Current Outpatient Medications  Medication Sig Dispense Refill  . azaTHIOprine (IMURAN) 50 MG tablet Take 100 mg by mouth daily.    . Cariprazine HCl (VRAYLAR) 4.5 MG CAPS Take by mouth at bedtime.    . celecoxib (CELEBREX) 200 MG capsule Take 1 capsule (200 mg total) by mouth 2 (two) times daily. 60 capsule 0  . cetirizine (ZYRTEC) 10 MG tablet Take 10 mg by mouth daily.    . citalopram (CELEXA) 20 MG tablet Take 1 tablet by mouth daily.    . Cyanocobalamin  (VITAMIN B-12) 5000 MCG TBDP Take 5,000 mcg by mouth daily.    . cyclobenzaprine (FLEXERIL) 10 MG tablet Take 1 tablet (10 mg total) by mouth 3 (three) times daily. 20 tablet 0  . dexlansoprazole (DEXILANT) 60 MG capsule Take 1 capsule (60 mg total) by mouth daily. 90 capsule 3  . diclofenac (VOLTAREN) 75 MG EC tablet Take 75 mg by mouth as needed.    . DULoxetine (CYMBALTA) 60 MG capsule Take 60 mg by mouth 2 (two) times daily. Reported on 05/03/2016    . famotidine (PEPCID) 20 MG tablet Take 1 tablet (20 mg total) by mouth 2 (two) times daily. 30 tablet 0  . FLUoxetine (PROZAC) 40 MG capsule Take 40 mg by mouth daily.    Marland Kitchen gabapentin (NEURONTIN) 300 MG capsule Take 300 mg by mouth 3 (three) times daily.     . hydrOXYzine (VISTARIL) 50 MG capsule Take 1 capsule by mouth at bedtime.    Marland Kitchen ibuprofen (ADVIL,MOTRIN) 800 MG tablet Take 800 mg by mouth every 8 (eight) hours as needed.    . lamoTRIgine (LAMICTAL) 150 MG tablet Take 150 mg by mouth 2 (two) times daily.     Marland Kitchen lovastatin (MEVACOR) 20 MG tablet Take 20 mg by mouth at bedtime.     . metFORMIN (GLUCOPHAGE) 500 MG tablet Take 500 mg by mouth daily.    . Multiple Vitamin (MULTIVITAMIN) tablet Take  1 tablet by mouth daily.    . ondansetron (ZOFRAN) 4 MG tablet TAKE ONE TABLET BY MOUTH EVERY 8 HOURS AS NEEDED FOR NAUSEA OR VOMITING 30 tablet 1  . oxyCODONE-acetaminophen (PERCOCET/ROXICET) 5-325 MG tablet Take by mouth every 6 (six) hours as needed for severe pain.    Marland Kitchen Potassium 99 MG TABS Take 1 tablet by mouth every morning.    . pravastatin (PRAVACHOL) 40 MG tablet Take 1 tablet by mouth daily.  5  . risperiDONE (RISPERDAL) 1 MG tablet Take 1 mg by mouth at bedtime.    . trazodone (DESYREL) 300 MG tablet Take 450 mg by mouth at bedtime.     .       .       No current facility-administered medications for this visit.     Allergies as of 12/12/2018  . (No Known Allergies)   Past Medical History:  Diagnosis Date  . Anemia   . Arthritis    . Back pain   . Bipolar 1 disorder ([place])   . Bipolar disorder ([place])   . 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 COPD (chronic obstructive pulmonary disease) ([place])   . Diverticulitis   . Erosive esophagitis   . Family history of adverse reaction to anesthesia    he was adopted  . GERD (gastroesophageal reflux disease)   . Heart disease    "irregular heart beat"  . Heart murmur   . Hiatal hernia   . HOH (hard of hearing)    biological parents are both deaf.   left ear is good.  . Hyperlipidemia   . Hypertension   . 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 Lung nodules   . Schizoaffective disorder, bipolar type ([place])   . Vomiting    Past Surgical History:  Procedure Laterality Date  . BRAIN SURGERY  2005   after head injury, patient states had 2 large blood clots evacuated  . COLONOSCOPY WITH ESOPHAGOGASTRODUODENOSCOPY (EGD) N/A 12/05/2013   ZOX:WRUE erosive relfux/HH/melanosis coli/colonic diverticulosis. tubulovillous adenoma removed. next tcs 11/2016  . COLONOSCOPY WITH PROPOFOL N/A 01/19/2017   Dr. Gala Romney: Diverticulosis, single 8 mm sessile serrated adenoma without dysplasia removed from the ascending colon, internal.  Next colonoscopy recommended in March 2023.  Marland Kitchen FLEXIBLE BRONCHOSCOPY N/A 01/23/2016   Procedure: FLEXIBLE BRONCHOSCOPY;  Surgeon: Gaye Pollack, MD;  Location: [facility];  Service: Thoracic;  Laterality: N/A;  . LUNG BIOPSY N/A 01/23/2016   Procedure: LEFT LUNG BIOPSY;  Surgeon: Gaye Pollack, MD;  Location: [facility];  Service: Thoracic;  Laterality: N/A;  . POLYPECTOMY  01/19/2017   Procedure: POLYPECTOMY;  Surgeon: Daneil Dolin, MD;  [facility];  Service: Endoscopy;;  ascending colon polyp  . VIDEO ASSISTED THORACOSCOPY Left 01/23/2016   Procedure: LEFT VIDEO ASSISTED THORACOSCOPY;  Surgeon: Gaye Pollack, MD;  [facility];  Service: Thoracic;  Laterality: Left;   Family History  Adopted: Yes  Problem Relation Age of Onset   . Alzheimer's disease Other   . Parkinson's disease Other   . Mental illness Other   . Colon cancer Neg Hx        adopted at 106 months old, unsure about GI history of parents    Social History   Tobacco Use  . Smoking status: Former Smoker    Packs/day: 0.75    Years: 25.00    Pack years: 18.75    Types:  Cigars, Cigarettes    Last attempt to quit: 07/09/2014    Years since quitting: 4.4  . Smokeless tobacco: Current User    Types: Snuff  . Tobacco comment: pt uses dip tobacco  Substance Use Topics  . Alcohol use: Yes    Alcohol/week: 0.0 standard drinks    Comment: occ  . Drug use: Never     ROS:  General: Negative for anorexia, weight loss, fever, chills, fatigue, weakness. ENT: Negative for hoarseness, difficulty swallowing , nasal congestion.see hpi CV: Negative for chest pain, angina, palpitations, dyspnea on exertion, peripheral edema.  Respiratory: Negative for dyspnea at rest, dyspnea on exertion, cough, sputum, wheezing.  GI: See history of present illness. GU:  Negative for dysuria, hematuria, urinary incontinence, urinary frequency, nocturnal urination.  Endo: Negative for unusual weight change.    Physical Examination:   BP 130/81   Pulse 84   Temp 97.9 F (36.6 C) (Oral)   Ht 5\' 10"  (1.778 m)   Wt 190 lb 12.8 oz (86.5 kg)   BMI 27.38 kg/m   General: Well-nourished, well-developed in no acute distress.  Eyes: No icterus. Mouth: Oropharyngeal mucosa moist and pink , no lesions erythema or exudate. Lungs: Clear to auscultation bilaterally.  Heart: Regular rate and rhythm, no murmurs rubs or gallops.  Abdomen: Bowel sounds are normal, nontender, nondistended, no hepatosplenomegaly or masses, no abdominal bruits or hernia , no rebound or guarding.   Extremities: No lower extremity edema. No clubbing or deformities. Neuro: Alert and oriented x 4   Skin: Warm and dry, no jaundice.   Psych: Alert and cooperative, normal mood and affect.  Labs:  This  SmartLink has not been configured with any valid records.    Lab Results  Component Value Date   CREATININE 1.01 11/23/2018   BUN 17 11/23/2018   NA 140 11/23/2018   K 4.4 11/23/2018   CL 107 11/23/2018   CO2 25 11/23/2018   Lab Results  Component Value Date   ALT 19 11/23/2018   AST 23 11/23/2018   ALKPHOS 40 11/23/2018   BILITOT 0.7 11/23/2018   Lab Results  Component Value Date   WBC 8.4 11/23/2018   HGB 14.2 11/23/2018   HCT 43.1 11/23/2018   MCV 95.6 11/23/2018   PLT 218 11/23/2018    Imaging Studies: Dg Acute Abd 2+v Abd (supine,erect,decub) + 1v Chest  Result Date: 11/23/2018 CLINICAL DATA:  48 year old male with bloating, frequent vomiting. EXAM: DG ABDOMEN ACUTE W/ 1V CHEST COMPARISON:  Lumbar radiographs 05/15/2018, and earlier. CT Abdomen and Pelvis CT 11/22/2016. FINDINGS: Upright PA view of the chest. Lung volumes and mediastinal contours remain normal. Both lungs appear clear. No pneumothorax or pneumoperitoneum. Visualized tracheal air column is within normal limits. Upright and supine views of the abdomen. Non obstructed bowel gas pattern. Normal abdominal and pelvic visceral contours. Incidental pelvic phleboliths. Negative visible osseous structures. IMPRESSION: 1. Normal bowel gas pattern, no free air. 2. Negative chest. Electronically Signed   By: Genevie Ann M.D.   On: 11/23/2018 15:09

## 2018-12-12 NOTE — Progress Notes (Signed)
CC'D TO PCP °

## 2018-12-25 ENCOUNTER — Emergency Department (HOSPITAL_COMMUNITY)
Admission: EM | Admit: 2018-12-25 | Discharge: 2018-12-25 | Disposition: A | Discharge status: Home / Self Care | Payer: Medicaid Other | Attending: Emergency Medicine | Admitting: Emergency Medicine

## 2018-12-25 ENCOUNTER — Encounter (HOSPITAL_COMMUNITY): Payer: Self-pay

## 2018-12-25 ENCOUNTER — Other Ambulatory Visit: Payer: Self-pay

## 2018-12-25 DIAGNOSIS — F17290 Nicotine dependence, other tobacco product, uncomplicated: Secondary | ICD-10-CM | POA: Diagnosis not present

## 2018-12-25 DIAGNOSIS — M545 Low back pain, unspecified: Secondary | ICD-10-CM

## 2018-12-25 DIAGNOSIS — J449 Chronic obstructive pulmonary disease, unspecified: Secondary | ICD-10-CM | POA: Insufficient documentation

## 2018-12-25 DIAGNOSIS — Z79899 Other long term (current) drug therapy: Secondary | ICD-10-CM | POA: Diagnosis not present

## 2018-12-25 DIAGNOSIS — I10 Essential (primary) hypertension: Secondary | ICD-10-CM | POA: Insufficient documentation

## 2018-12-25 MED ORDER — ORPHENADRINE CITRATE ER 100 MG PO TB12: 100.0000 mg | ORAL_TABLET | Freq: Two times a day (BID) | ORAL | 0 refills | Status: AC

## 2018-12-25 MED ORDER — KETOROLAC TROMETHAMINE 30 MG/ML IJ SOLN
30.0000 mg | Freq: Once | INTRAMUSCULAR | Status: AC
  Administered 2018-12-25: 30 mg via INTRAMUSCULAR
  Filled 2018-12-25: qty 1

## 2018-12-25 NOTE — ED Provider Notes (Signed)
[facility] EMERGENCY DEPARTMENT Provider Note   CSN: [id number] Arrival date & time: 12/25/18  1214     History   Chief Complaint Chief Complaint  Patient presents with  . Back Pain    HPI Shawn [name] is a 48 y.o. male.  48 year old male presents with complaint of acute on chronic right lower back pain.  Patient reports history of arthritis, states that about a week and a half ago he was picking up golf balls on the golf [place] when he slipped on some wet leaves and had a minor fall.  Patient states that for the past several days now he has had pain in his right lower back described as a pinched nerve.  Pain does not radiate, pain is worse with movement and palpation as well as transitioning from sitting to standing.  Patient has a chronic lung condition and has been advised to walk, he reports walking daily without difficulty, states he walked 5 miles today to get to the emergency room.  Patient denies abdominal pain, groin numbness, loss of bowel or bladder control.  Patient is taking Percocet, Celebrex, diclofenac, ibuprofen, Flexeril as part of his regular medication regimen without relief of his pain. No other complaints or concerns.      Past Medical History:  Diagnosis Date  . Anemia   . Arthritis   . Back pain   . Bipolar 1 disorder ([place])   . Bipolar disorder ([place])   . 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 COPD (chronic obstructive pulmonary disease) ([place])   . Diverticulitis   . Erosive esophagitis   . Family history of adverse reaction to anesthesia    he was adopted  . GERD (gastroesophageal reflux disease)   . Heart disease    "irregular heart beat"  . Heart murmur   . Hiatal hernia   . HOH (hard of hearing)    biological parents are both deaf.   left ear is good.  . Hyperlipidemia   . Hypertension   . 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)   . Lung nodules   . Schizoaffective disorder, bipolar type ([place])   . Vomiting      Patient Active Problem List   Diagnosis Date Noted  . Abdominal pain, epigastric 12/12/2018  . Esophageal dysphagia 12/12/2018  . Elevated LFTs 08/16/2018  . Diarrhea 05/24/2018  . Lower abdominal pain 05/24/2018  . Macrocytic anemia 05/24/2018  . Rectal bleeding 07/21/2017  . Abnormal CT scan, colon 11/17/2016  . Hx of adenomatous colonic polyps 11/17/2016  . Hypotension 04/23/2016  . Fever 04/23/2016  . Cough 04/23/2016  . Tick bites 04/23/2016  . Hypokalemia 04/23/2016  . 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 Multiple lung nodules on CT 01/23/2016  . 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 Schizoaffective disorder ([place]) 12/22/2015  . Environmental allergies 12/22/2015  . Arrhythmia 12/22/2015  . Hyperlipidemia 12/22/2015  . Multiple lung nodules   . Chronic obstructive pulmonary disease ([place]) 11/17/2015  . Abdominal pain, periumbilical 25/36/6440  . Nausea with vomiting 07/03/2015  . Abnormal chest CT 07/03/2015  . Loose stools 10/30/2013  . Diverticulitis of colon (without mention of hemorrhage)(562.11) 10/01/2013  . GERD (gastroesophageal reflux disease) 10/01/2013    Past Surgical History:  Procedure Laterality Date  . BRAIN SURGERY  2005   after head injury, patient states had 2 large blood clots evacuated  . COLONOSCOPY WITH ESOPHAGOGASTRODUODENOSCOPY (EGD) N/A 12/05/2013   HKV:[id number] erosive relfux/HH/melanosis  coli/colonic diverticulosis. tubulovillous adenoma removed. next tcs 11/2016  . COLONOSCOPY WITH PROPOFOL N/A 01/19/2017   Dr. Gala Romney: Diverticulosis, single 8 mm sessile serrated adenoma without dysplasia removed from the ascending colon, internal.  Next colonoscopy recommended in March 2023.  Marland Kitchen FLEXIBLE BRONCHOSCOPY N/A 01/23/2016   Procedure: FLEXIBLE BRONCHOSCOPY;  Surgeon: Gaye Pollack, MD;  Location: [place];  Service: Thoracic;  Laterality: N/A;  . LUNG BIOPSY N/A 01/23/2016   Procedure: LEFT LUNG BIOPSY;  Surgeon: Gaye Pollack, MD;   Location: [place];  Service: Thoracic;  Laterality: N/A;  . POLYPECTOMY  01/19/2017   Procedure: POLYPECTOMY;  Surgeon: Daneil Dolin, MD;  [facility];  Service: Endoscopy;;  ascending colon polyp  . VIDEO ASSISTED THORACOSCOPY Left 01/23/2016   Procedure: LEFT VIDEO ASSISTED THORACOSCOPY;  Surgeon: Gaye Pollack, MD;  [facility];  Service: Thoracic;  Laterality: Left;        Home Medications    Prior to Admission medications   Medication Sig Start Date End Date Taking? Authorizing Provider  azaTHIOprine (IMURAN) 50 MG tablet Take 100 mg by mouth daily.    [provider]  Cariprazine HCl (VRAYLAR) 4.5 MG CAPS Take by mouth at bedtime.    [provider]  celecoxib (CELEBREX) 200 MG capsule Take 1 capsule (200 mg total) by mouth 2 (two) times daily. 05/15/18   Isla Pence, MD  cetirizine (ZYRTEC) 10 MG tablet Take 10 mg by mouth daily.    [provider]  citalopram (CELEXA) 20 MG tablet Take 1 tablet by mouth daily. 05/01/18   [provider]  Cyanocobalamin (VITAMIN B-12) 5000 MCG TBDP Take 5,000 mcg by mouth daily.    [provider]  dexlansoprazole (DEXILANT) 60 MG capsule Take 1 capsule (60 mg total) by mouth daily. 08/16/18   Carlis Stable, NP  diclofenac (VOLTAREN) 75 MG EC tablet Take 75 mg by mouth as needed.    [provider]  DULoxetine (CYMBALTA) 60 MG capsule Take 60 mg by mouth 2 (two) times daily. Reported on 05/03/2016    [provider]  famotidine (PEPCID) 20 MG tablet Take 1 tablet (20 mg total) by mouth 2 (two) times daily. 11/23/18   Lily Kocher, PA-C  FLUoxetine (PROZAC) 40 MG capsule Take 40 mg by mouth daily.    [provider]  gabapentin (NEURONTIN) 300 MG capsule Take 300 mg by mouth 3 (three) times daily.     [provider]  hydrOXYzine (VISTARIL) 50 MG capsule Take 1 capsule by mouth at bedtime. 04/20/18   [provider]  ibuprofen (ADVIL,MOTRIN) 800 MG  tablet Take 800 mg by mouth every 8 (eight) hours as needed.    [provider]  lamoTRIgine (LAMICTAL) 150 MG tablet Take 150 mg by mouth 2 (two) times daily.     [provider]  lovastatin (MEVACOR) 20 MG tablet Take 20 mg by mouth at bedtime.     [provider]  metFORMIN (GLUCOPHAGE) 500 MG tablet Take 500 mg by mouth daily.    [provider]  Multiple Vitamin (MULTIVITAMIN) tablet Take 1 tablet by mouth daily.    [provider]  ondansetron (ZOFRAN) 4 MG tablet TAKE ONE TABLET BY MOUTH EVERY 8 HOURS AS NEEDED FOR NAUSEA OR VOMITING 04/20/16   Carlis Stable, NP  orphenadrine (NORFLEX) 100 MG tablet Take 1 tablet (100 mg total) by mouth 2 (two) times daily for 10 days. 12/25/18 01/04/19  Tacy Learn,  PA-C  oxyCODONE-acetaminophen (PERCOCET/ROXICET) 5-325 MG tablet Take by mouth every 6 (six) hours as needed for severe pain.    [provider]  Potassium 99 MG TABS Take 1 tablet by mouth every morning.    [provider]  pravastatin (PRAVACHOL) 40 MG tablet Take 1 tablet by mouth daily. 11/09/17   [provider]  risperiDONE (RISPERDAL) 1 MG tablet Take 1 mg by mouth at bedtime.    [provider]  sucralfate (CARAFATE) 1 GM/10ML suspension Take 10 mLs (1 g total) by mouth 4 (four) times daily -  with meals and at bedtime. May substitute with tablets if liquid not covered. 12/12/18   Mahala Menghini, PA-C  trazodone (DESYREL) 300 MG tablet Take 450 mg by mouth at bedtime.     [provider]    Family History Family History  Adopted: Yes  Problem Relation Age of Onset  . Alzheimer's disease Other   . Parkinson's disease Other   . Mental illness Other   . Colon cancer Neg Hx        adopted at 54 months old, unsure about GI history of parents     Social History Social History   Tobacco Use  . Smoking status: Former Smoker    Packs/day: 0.75    Years: 25.00    Pack years: 18.75    Types:  Cigars, Cigarettes    Last attempt to quit: 07/09/2014    Years since quitting: 4.4  . Smokeless tobacco: Current User    Types: Snuff  . Tobacco comment: pt uses dip tobacco  Substance Use Topics  . Alcohol use: Yes    Alcohol/week: 0.0 standard drinks    Comment: occ  . Drug use: Never     Allergies   Patient has no known allergies.   Review of Systems Review of Systems  Constitutional: Negative for fever.  Gastrointestinal: Negative for abdominal pain.  Genitourinary: Negative for difficulty urinating.  Musculoskeletal: Positive for back pain. Negative for gait problem.  Skin: Negative for color change, rash and wound.  Allergic/Immunologic: Positive for immunocompromised state.  Neurological: Negative for weakness and numbness.  Hematological: Does not bruise/bleed easily.  Psychiatric/Behavioral: Negative for confusion.  All other systems reviewed and are negative.    Physical Exam Updated Vital Signs BP (!) 131/92 (BP Location: Right Arm)   Pulse 99   Temp 97.6 F (36.4 C) (Oral)   Resp 18   Ht 5\' 10"  (1.778 m)   Wt 87.5 kg   SpO2 98%   BMI 27.69 kg/m   Physical Exam Vitals signs and nursing note reviewed.  Constitutional:      General: He is not in acute distress.    Appearance: Normal appearance. He is well-developed. He is not diaphoretic.  HENT:     Head: Normocephalic and atraumatic.  Cardiovascular:     Rate and Rhythm: Normal rate and regular rhythm.     Pulses: Normal pulses.     Heart sounds: Normal heart sounds.  Pulmonary:     Effort: Pulmonary effort is normal.     Breath sounds: Normal breath sounds.  Abdominal:     Tenderness: There is no abdominal tenderness.  Musculoskeletal:        General: Tenderness present. No deformity.     Cervical back: Normal.     Thoracic back: Normal.     Lumbar back: He exhibits tenderness. He exhibits no bony tenderness.       Back:  Comments: Tenderness right lumbar paraspinous  Skin:     General: Skin is warm and dry.     Findings: No erythema or rash.  Neurological:     Mental Status: He is alert and oriented to person, place, and time.  Psychiatric:        Behavior: Behavior normal.      ED Treatments / Results  Labs (all labs ordered are listed, but only abnormal results are displayed) Labs Reviewed - No data to display  EKG None  Radiology No results found.  Procedures Procedures (including critical care time)  Medications Ordered in ED Medications  ketorolac (TORADOL) 30 MG/ML injection 30 mg (30 mg Intramuscular Given 12/25/18 1351)     Initial Impression / Assessment and Plan / ED Course  I have reviewed the triage vital signs and the nursing notes.  Pertinent labs & imaging results that were available during my care of the patient were reviewed by me and considered in my medical decision making (see chart for details).  Clinical Course as of Dec 25 1402  Mon Dec 26, 8619  425 48 year old male presents with complaint of right lower back pain, acute on chronic.  Patient is taking all of his regular pain medications without improvement in his pain.  Patient reports pain with movement and palpation, pain with transitioning from sitting to standing.  Exam patient has right paraspinous tenderness without midline bony tenderness.  Reflexes are symmetric, leg strength equal.  Reviewed patient's current medication regimen with him including Celebrex, Voltaren, ibuprofen, Flexeril, Percocet.  Patient has GI history, recommend he review these medications with his PCP and gastroenterologist, patient states that his doctors are well aware of the medications he is taking, states he takes the ibuprofen only as needed. Patient was given 1 dose of Toradol in the ER for his pain, advised to stop the Flexeril and try Norflex and to contact his PCP for follow up.    [LM]    Clinical Course User Index [LM] Tacy Learn, PA-C   Final Clinical Impressions(s) / ED  Diagnoses   Final diagnoses:  Acute right-sided low back pain without sciatica    ED Discharge Orders         Ordered    orphenadrine (NORFLEX) 100 MG tablet  2 times daily     12/25/18 1337           Tacy Learn, PA-C 12/25/18 1404    Nat Christen, MD 12/26/18 [phone]

## 2018-12-25 NOTE — Discharge Instructions (Signed)
Recommend discussing your pain regiment with your primary care provider. Discontinue your Flexeril, take Norflex as prescribed as needed.

## 2018-12-25 NOTE — ED Triage Notes (Signed)
Pt reports he has arthritis in back, now has pain right lower back.  Reports he walked over 7 miles yesterday

## 2019-01-29 ENCOUNTER — Other Ambulatory Visit: Payer: Self-pay

## 2019-01-29 ENCOUNTER — Encounter (HOSPITAL_COMMUNITY): Payer: Self-pay

## 2019-01-29 ENCOUNTER — Encounter (HOSPITAL_COMMUNITY)
Admission: RE | Admit: 2019-01-29 | Discharge: 2019-01-29 | Disposition: A | Discharge status: Home / Self Care | Payer: Medicaid Other | Source: Ambulatory Visit | Attending: Internal Medicine | Admitting: Internal Medicine

## 2019-01-30 ENCOUNTER — Telehealth: Payer: Self-pay

## 2019-01-30 NOTE — Telephone Encounter (Signed)
Endo scheduler called office to see if pt can arrive earlier for EGD/-/+DIL w/Propofol w/RMR 02/05/19.  Called pt, procedure moved up to 02/05/19 at 9:45am. He will arrive at 8:15am. Advised him to be NPO after midnight. Endo scheduler informed.

## 2019-02-05 ENCOUNTER — Encounter (HOSPITAL_COMMUNITY)
Admission: RE | Disposition: A | Discharge status: Home / Self Care | Payer: Self-pay | Source: Home / Self Care | Attending: Internal Medicine

## 2019-02-05 ENCOUNTER — Encounter (HOSPITAL_COMMUNITY): Payer: Self-pay

## 2019-02-05 ENCOUNTER — Ambulatory Visit (HOSPITAL_COMMUNITY)
Admission: RE | Admit: 2019-02-05 | Discharge: 2019-02-05 | Disposition: A | Discharge status: Home / Self Care | Payer: Medicaid Other | Attending: Internal Medicine | Admitting: Internal Medicine

## 2019-02-05 ENCOUNTER — Ambulatory Visit (HOSPITAL_COMMUNITY): Payer: Medicaid Other | Admitting: Anesthesiology

## 2019-02-05 ENCOUNTER — Other Ambulatory Visit: Payer: Self-pay

## 2019-02-05 DIAGNOSIS — I10 Essential (primary) hypertension: Secondary | ICD-10-CM | POA: Insufficient documentation

## 2019-02-05 DIAGNOSIS — Z791 Long term (current) use of non-steroidal anti-inflammatories (NSAID): Secondary | ICD-10-CM | POA: Insufficient documentation

## 2019-02-05 DIAGNOSIS — K209 Esophagitis, unspecified: Secondary | ICD-10-CM | POA: Diagnosis not present

## 2019-02-05 DIAGNOSIS — F17290 Nicotine dependence, other tobacco product, uncomplicated: Secondary | ICD-10-CM | POA: Diagnosis not present

## 2019-02-05 DIAGNOSIS — R1013 Epigastric pain: Secondary | ICD-10-CM

## 2019-02-05 DIAGNOSIS — K449 Diaphragmatic hernia without obstruction or gangrene: Secondary | ICD-10-CM | POA: Diagnosis not present

## 2019-02-05 DIAGNOSIS — E785 Hyperlipidemia, unspecified: Secondary | ICD-10-CM | POA: Insufficient documentation

## 2019-02-05 DIAGNOSIS — M1990 Unspecified osteoarthritis, unspecified site: Secondary | ICD-10-CM | POA: Diagnosis not present

## 2019-02-05 DIAGNOSIS — Z7984 Long term (current) use of oral hypoglycemic drugs: Secondary | ICD-10-CM | POA: Diagnosis not present

## 2019-02-05 DIAGNOSIS — J449 Chronic obstructive pulmonary disease, unspecified: Secondary | ICD-10-CM | POA: Diagnosis not present

## 2019-02-05 DIAGNOSIS — Z79899 Other long term (current) drug therapy: Secondary | ICD-10-CM | POA: Diagnosis not present

## 2019-02-05 DIAGNOSIS — K2100 Gastro-esophageal reflux disease with esophagitis, without bleeding: Secondary | ICD-10-CM

## 2019-02-05 DIAGNOSIS — R1310 Dysphagia, unspecified: Secondary | ICD-10-CM

## 2019-02-05 DIAGNOSIS — R1319 Other dysphagia: Secondary | ICD-10-CM

## 2019-02-05 DIAGNOSIS — F250 Schizoaffective disorder, bipolar type: Secondary | ICD-10-CM | POA: Diagnosis not present

## 2019-02-05 DIAGNOSIS — R1314 Dysphagia, pharyngoesophageal phase: Secondary | ICD-10-CM | POA: Diagnosis not present

## 2019-02-05 HISTORY — PX: ESOPHAGOGASTRODUODENOSCOPY (EGD) WITH PROPOFOL: SHX5813

## 2019-02-05 HISTORY — PX: MALONEY DILATION: SHX5535

## 2019-02-05 LAB — GLUCOSE, CAPILLARY: Glucose-Capillary: 109 mg/dL — ABNORMAL HIGH (ref 70–99)

## 2019-02-05 SURGERY — ESOPHAGOGASTRODUODENOSCOPY (EGD) WITH PROPOFOL
Anesthesia: Monitor Anesthesia Care

## 2019-02-05 MED ORDER — LACTATED RINGERS IV SOLN: INTRAVENOUS | Status: DC

## 2019-02-05 MED ORDER — PROPOFOL 500 MG/50ML IV EMUL
INTRAVENOUS | Status: DC | PRN
  Administered 2019-02-05: 150 ug/kg/min via INTRAVENOUS

## 2019-02-05 MED ORDER — PROMETHAZINE HCL 25 MG/ML IJ SOLN: 6.2500 mg | INTRAMUSCULAR | Status: DC | PRN

## 2019-02-05 MED ORDER — HYDROMORPHONE HCL 1 MG/ML IJ SOLN: 0.2500 mg | INTRAMUSCULAR | Status: DC | PRN

## 2019-02-05 MED ORDER — CHLORHEXIDINE GLUCONATE CLOTH 2 % EX PADS: 6.0000 | MEDICATED_PAD | Freq: Once | CUTANEOUS | Status: DC

## 2019-02-05 MED ORDER — LIDOCAINE 2% (20 MG/ML) 5 ML SYRINGE
INTRAMUSCULAR | Status: AC
  Filled 2019-02-05: qty 5

## 2019-02-05 MED ORDER — GLYCOPYRROLATE 0.2 MG/ML IJ SOLN
INTRAMUSCULAR | Status: DC | PRN
  Administered 2019-02-05: 0.2 mg via INTRAVENOUS

## 2019-02-05 MED ORDER — KETAMINE HCL 50 MG/5ML IJ SOSY
PREFILLED_SYRINGE | INTRAMUSCULAR | Status: AC
  Filled 2019-02-05: qty 5

## 2019-02-05 MED ORDER — PROPOFOL 10 MG/ML IV BOLUS
INTRAVENOUS | Status: AC
  Filled 2019-02-05: qty 40

## 2019-02-05 MED ORDER — LACTATED RINGERS IV SOLN
INTRAVENOUS | Status: DC
  Administered 2019-02-05: 10:00:00 via INTRAVENOUS

## 2019-02-05 MED ORDER — MEPERIDINE HCL 100 MG/ML IJ SOLN: 6.2500 mg | INTRAMUSCULAR | Status: DC | PRN

## 2019-02-05 MED ORDER — HYDROCODONE-ACETAMINOPHEN 7.5-325 MG PO TABS: 1.0000 | ORAL_TABLET | Freq: Once | ORAL | Status: DC | PRN

## 2019-02-05 MED ORDER — LIDOCAINE HCL (CARDIAC) PF 100 MG/5ML IV SOSY
PREFILLED_SYRINGE | INTRAVENOUS | Status: DC | PRN
  Administered 2019-02-05: 40 mg via INTRAVENOUS

## 2019-02-05 MED ORDER — STERILE WATER FOR IRRIGATION IR SOLN
Status: DC | PRN
  Administered 2019-02-05: 1.5 mL

## 2019-02-05 MED ORDER — GLYCOPYRROLATE PF 0.2 MG/ML IJ SOSY
PREFILLED_SYRINGE | INTRAMUSCULAR | Status: AC
  Filled 2019-02-05: qty 1

## 2019-02-05 MED ORDER — KETAMINE HCL 10 MG/ML IJ SOLN
INTRAMUSCULAR | Status: DC | PRN
  Administered 2019-02-05: 10 mg via INTRAVENOUS
  Administered 2019-02-05: 5 mg via INTRAVENOUS

## 2019-02-05 NOTE — H&P (Signed)
@LOGO @   Primary Care Physician:  Celene Squibb, MD Primary Gastroenterologist:  Dr. Gala Romney  Pre-Procedure History & Physical: HPI:  Shawn [name] is a 48 y.o. male here for longstanding, refractory GERD and intermittent esophageal dysphagia.  States Dexilant 60 mg daily does very well.  Not able to get the Carafate filled because of expenses.  Past Medical History:  Diagnosis Date  . Anemia   . Arthritis   . Back pain   . Bipolar 1 disorder ([place])   . Bipolar disorder ([place])   . 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 COPD (chronic obstructive pulmonary disease) ([place])   . Diverticulitis   . Erosive esophagitis   . Family history of adverse reaction to anesthesia    he was adopted  . GERD (gastroesophageal reflux disease)   . Heart disease    "irregular heart beat"  . Heart murmur   . Hiatal hernia   . HOH (hard of hearing)    biological parents are both deaf.   left ear is good.  . Hyperlipidemia   . Hypertension   . 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 Lung nodules   . Schizoaffective disorder, bipolar type ([place])   . Vomiting     Past Surgical History:  Procedure Laterality Date  . BRAIN SURGERY  2005   after head injury, patient states had 2 large blood clots evacuated  . COLONOSCOPY WITH ESOPHAGOGASTRODUODENOSCOPY (EGD) N/A 12/05/2013   CHE:NIDP erosive relfux/HH/melanosis coli/colonic diverticulosis. tubulovillous adenoma removed. next tcs 11/2016  . COLONOSCOPY WITH PROPOFOL N/A 01/19/2017   Dr. Gala Romney: Diverticulosis, single 8 mm sessile serrated adenoma without dysplasia removed from the ascending colon, internal.  Next colonoscopy recommended in March 2023.  Marland Kitchen FLEXIBLE BRONCHOSCOPY N/A 01/23/2016   Procedure: FLEXIBLE BRONCHOSCOPY;  Surgeon: Gaye Pollack, MD;  Location: [facility];  Service: Thoracic;  Laterality: N/A;  . LUNG BIOPSY N/A 01/23/2016   Procedure: LEFT LUNG BIOPSY;  Surgeon: Gaye Pollack, MD;  Location: [facility];  Service:  Thoracic;  Laterality: N/A;  . POLYPECTOMY  01/19/2017   Procedure: POLYPECTOMY;  Surgeon: Daneil Dolin, MD;  [facility];  Service: Endoscopy;;  ascending colon polyp  . VIDEO ASSISTED THORACOSCOPY Left 01/23/2016   Procedure: LEFT VIDEO ASSISTED THORACOSCOPY;  Surgeon: Gaye Pollack, MD;  Location: [facility];  Service: Thoracic;  Laterality: Left;    Prior to Admission medications   Medication Sig Start Date End Date Taking? Authorizing Provider  Cyanocobalamin (VITAMIN B-12) 5000 MCG TBDP Take 5,000 mcg by mouth daily.   Yes [provider]  dexlansoprazole (DEXILANT) 60 MG capsule Take 1 capsule (60 mg total) by mouth daily. 08/16/18  Yes Carlis Stable, NP  famotidine (PEPCID) 20 MG tablet Take 1 tablet (20 mg total) by mouth 2 (two) times daily. 11/23/18  Yes Lily Kocher, PA-C  ibuprofen (ADVIL,MOTRIN) 800 MG tablet Take 800 mg by mouth every 8 (eight) hours as needed.   Yes [provider]  Multiple Vitamin (MULTIVITAMIN) tablet Take 1 tablet by mouth daily.   Yes [provider]  oxyCODONE-acetaminophen (PERCOCET/ROXICET) 5-325 MG tablet Take by mouth every 6 (six) hours as needed for severe pain.   Yes [provider]  Potassium 99 MG TABS Take 1 tablet by mouth every morning.   Yes [provider]  trazodone (DESYREL) 300 MG tablet Take 450 mg by mouth at bedtime.    Yes  [provider]  azaTHIOprine (IMURAN) 50 MG tablet Take 100 mg by mouth daily.    [provider]  Cariprazine HCl (VRAYLAR) 4.5 MG CAPS Take by mouth at bedtime.    [provider]  celecoxib (CELEBREX) 200 MG capsule Take 1 capsule (200 mg total) by mouth 2 (two) times daily. 05/15/18   Isla Pence, MD  cetirizine (ZYRTEC) 10 MG tablet Take 10 mg by mouth daily.    [provider]  citalopram (CELEXA) 20 MG tablet Take 1 tablet by mouth daily. 05/01/18   [provider]  diclofenac (VOLTAREN) 75 MG EC tablet Take 75  mg by mouth as needed.    [provider]  DULoxetine (CYMBALTA) 60 MG capsule Take 60 mg by mouth 2 (two) times daily. Reported on 05/03/2016    [provider]  FLUoxetine (PROZAC) 40 MG capsule Take 40 mg by mouth daily.    [provider]  gabapentin (NEURONTIN) 300 MG capsule Take 300 mg by mouth 3 (three) times daily.     [provider]  hydrOXYzine (VISTARIL) 50 MG capsule Take 1 capsule by mouth at bedtime. 04/20/18   [provider]  lamoTRIgine (LAMICTAL) 150 MG tablet Take 150 mg by mouth 2 (two) times daily.     [provider]  lovastatin (MEVACOR) 20 MG tablet Take 20 mg by mouth at bedtime.     [provider]  metFORMIN (GLUCOPHAGE) 500 MG tablet Take 500 mg by mouth daily.    [provider]  ondansetron (ZOFRAN) 4 MG tablet TAKE ONE TABLET BY MOUTH EVERY 8 HOURS AS NEEDED FOR NAUSEA OR VOMITING 04/20/16   Carlis Stable, NP  pravastatin (PRAVACHOL) 40 MG tablet Take 1 tablet by mouth daily. 11/09/17   [provider]  risperiDONE (RISPERDAL) 1 MG tablet Take 1 mg by mouth at bedtime.    [provider]  sucralfate (CARAFATE) 1 GM/10ML suspension Take 10 mLs (1 g total) by mouth 4 (four) times daily -  with meals and at bedtime. May substitute with tablets if liquid not covered. 12/12/18   Mahala Menghini, PA-C    Allergies as of 12/12/2018  . (No Known Allergies)    Family History  Adopted: Yes  Problem Relation Age of Onset  . Alzheimer's disease Other   . Parkinson's disease Other   . Mental illness Other   . Colon cancer Neg Hx        adopted at 26 months old, unsure about GI history of parents     Social History   Socioeconomic History  . Marital status: Divorced    Spouse name: Not on file  . Number of children: 1  . Years of education: Not on file  . Highest education level: Not on file  Occupational History  . Occupation: disabled  Social Needs  . Financial resource  strain: Not on file  . Food insecurity:    Worry: Not on file    Inability: Not on file  . Transportation needs:    Medical: Not on file    Non-medical: Not on file  Tobacco Use  . Smoking status: Former Smoker    Packs/day: 0.75    Years: 25.00    Pack years: 18.75    Types: Cigars, Cigarettes    Last attempt to quit: 07/09/2014    Years since quitting: 4.5  . Smokeless tobacco: Current User    Types: Snuff  . Tobacco comment: pt uses dip tobacco  Substance and Sexual Activity  . Alcohol use: Yes    Alcohol/week: 0.0 standard drinks    Comment: occ  . Drug use: Never  . Sexual activity: Not Currently    Birth control/protection: None  Lifestyle  . Physical activity:    Days per week: Not on file    Minutes per session: Not on file  . Stress: Not on file  Relationships  . Social connections:    Talks on phone: Not on file    Gets together: Not on file    Attends religious service: Not on file    Active member of club or organization: Not on file    Attends meetings of clubs or organizations: Not on file    Relationship status: Not on file  . Intimate partner violence:    Fear of current or ex partner: Not on file    Emotionally abused: Not on file    Physically abused: Not on file    Forced sexual activity: Not on file  Other Topics Concern  . Not on file  Social History Narrative   He is originally from [place]. 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 Has always lived in [place]. Previously have traveled to [place], [place], [place], [place], [place]. Remote travel to [place]. 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 Lived in a house with mold around 2001-2002 as well as 2006. Has 2 dogs currently. No bird exposure. No hot tub exposure. Mainly walks for fun. He was incarcerated for 2 years previously. No known TB exposure. Had previous negative PPDs last around 2010. Never lived in a homeless shelter but has eaten at them before.     Review of Systems: See HPI,  otherwise negative ROS  Physical Exam: BP 118/80   Pulse (!) 50   Temp (!) 97.5 F (36.4 C) (Oral)   Resp 16   Ht 5\' 10"  (1.778 m)   Wt 87.1 kg   SpO2 97%   BMI 27.55 kg/m  General:   Alert,  Well-developed, well-nourished, pleasant and cooperative in NAD Neck:  Supple; no masses or thyromegaly. No significant cervical adenopathy. Lungs:  Clear throughout to auscultation.   No wheezes, crackles, or rhonchi. No acute distress. Heart:  Regular rate and rhythm; no murmurs, clicks, rubs,  or gallops. Abdomen: Non-distended, normal bowel sounds.  Soft and nontender without appreciable mass or hepatosplenomegaly.  Pulses:  Normal pulses noted. Extremities:  Without clubbing or edema.  Impression/Plan: Pleasant gentleman with longstanding, refractory GERD now with esophageal dysphagia.  His report, taking Dexilant 60 mg daily is controlling his reflux symptoms nicely.  Still with dysphagia. EGD with ED as feasible/appropriate today per plan.  The risks, benefits, limitations, alternatives and imponderables have been reviewed with the patient. Potential for esophageal dilation, biopsy, etc. have also been reviewed.  Questions have been answered. All parties agreeable.    Notice: This dictation was prepared with Dragon dictation along with smaller phrase technology. Any transcriptional errors that result from this process are unintentional and may not be corrected upon review.

## 2019-02-05 NOTE — Transfer of Care (Signed)
Immediate Anesthesia Transfer of Care Note  Patient: Shawn [name]  Procedure(s) Performed: ESOPHAGOGASTRODUODENOSCOPY (EGD) WITH PROPOFOL (N/A ) MALONEY DILATION (N/A )  Patient Location: PACU  Anesthesia Type:MAC  Level of Consciousness: awake, alert , oriented and patient cooperative  Airway & Oxygen Therapy: Patient Spontanous Breathing  Post-op Assessment: Report given to RN and Post -op Vital signs reviewed and stable  Post vital signs: Reviewed and stable  Last Vitals:  Vitals Value Taken Time  BP 97/61 02/05/2019 11:00 AM  Temp    Pulse 52 02/05/2019 11:00 AM  Resp 14 02/05/2019 11:00 AM  SpO2 94 % 02/05/2019 11:00 AM  Vitals shown include unvalidated device data.  Last Pain:  Vitals:   02/05/19 1037  TempSrc:   PainSc: 0-No pain      Patients Stated Pain Goal: 4 (31/59/45 [id number])  Complications: No apparent anesthesia complications

## 2019-02-05 NOTE — Discharge Instructions (Signed)
Hiatal Hernia  A hiatal hernia occurs when part of the stomach slides above the muscle that separates the abdomen from the chest (diaphragm). A person can be born with a hiatal hernia (congenital), or it may develop over time. In almost all cases of hiatal hernia, only the top part of the stomach pushes through the diaphragm. Many people have a hiatal hernia with no symptoms. The larger the hernia, the more likely it is that you will have symptoms. In some cases, a hiatal hernia allows stomach acid to flow back into the tube that carries food from your mouth to your stomach (esophagus). This may cause heartburn symptoms. Severe heartburn symptoms may mean that you have developed a condition called gastroesophageal reflux disease (GERD). What are the causes? This condition is caused by a weakness in the opening (hiatus) where the esophagus passes through the diaphragm to attach to the upper part of the stomach. A person may be born with a weakness in the hiatus, or a weakness can develop over time. What increases the risk? This condition is more likely to develop in:  Older people. Age is a major risk factor for a hiatal hernia, especially if you are over the age of 32.  Pregnant women.  People who are overweight.  People who have frequent constipation. What are the signs or symptoms? Symptoms of this condition usually develop in the form of GERD symptoms. Symptoms include:  Heartburn.  Belching.  Indigestion.  Trouble swallowing.  Coughing or wheezing.  Sore throat.  Hoarseness.  Chest pain.  Nausea and vomiting. How is this diagnosed? This condition may be diagnosed during testing for GERD. Tests that may be done include:  X-rays of your stomach or chest.  An upper gastrointestinal (GI) series. This is an X-ray exam of your GI tract that is taken after you swallow a chalky liquid that shows up clearly on the X-ray.  Endoscopy. This is a procedure to look into your stomach  using a thin, flexible tube that has a tiny camera and light on the end of it. How is this treated? This condition may be treated by:  Dietary and lifestyle changes to help reduce GERD symptoms.  Medicines. These may include: ? Over-the-counter antacids. ? Medicines that make your stomach empty more quickly. ? Medicines that block the production of stomach acid (H2 blockers). ? Stronger medicines to reduce stomach acid (proton pump inhibitors).  Surgery to repair the hernia, if other treatments are not helping. If you have no symptoms, you may not need treatment. Follow these instructions at home: Lifestyle and activity  Do not use any products that contain nicotine or tobacco, such as cigarettes and e-cigarettes. If you need help quitting, ask your health care provider.  Try to achieve and maintain a healthy body weight.  Avoid putting pressure on your abdomen. Anything that puts pressure on your abdomen increases the amount of acid that may be pushed up into your esophagus. ? Avoid bending over, especially after eating. ? Raise the head of your bed by putting blocks under the legs. This keeps your head and esophagus higher than your stomach. ? Do not wear tight clothing around your chest or stomach. ? Try not to strain when having a bowel movement, when urinating, or when lifting heavy objects. Eating and drinking  Avoid foods that can worsen GERD symptoms. These may include: ? Fatty foods, like fried foods. ? Citrus fruits, like oranges or lemon. ? Other foods and drinks that contain acid, like  orange juice or tomatoes. ? Spicy food. ? Chocolate.  Eat frequent small meals instead of three large meals a day. This helps prevent your stomach from getting too full. ? Eat slowly. ? Do not lie down right after eating. ? Do not eat 1-2 hours before bed.  Do not drink beverages with caffeine. These include cola, coffee, cocoa, and tea.  Do not drink alcohol. General  instructions  Take over-the-counter and prescription medicines only as told by your health care provider.  Keep all follow-up visits as told by your health care provider. This is important. Contact a health care provider if:  Your symptoms are not controlled with medicines or lifestyle changes.  You are having trouble swallowing.  You have coughing or wheezing that will not go away. Get help right away if:  Your pain is getting worse.  Your pain spreads to your arms, neck, jaw, teeth, or back.  You have shortness of breath.  You sweat for no reason.  You feel sick to your stomach (nauseous) or you vomit.  You vomit blood.  You have bright red blood in your stools.  You have black, tarry stools. This information is not intended to replace advice given to you by your health care provider. Make sure you discuss any questions you have with your health care provider. Document Released: 01/15/2004 Document Revised: 05/30/2017 Document Reviewed: 05/30/2017 Elsevier Interactive Patient Education  2019 [place]. Gastroesophageal Reflux Disease, Adult Gastroesophageal reflux (GER) happens when acid from the stomach flows up into the tube that connects the mouth and the stomach (esophagus). Normally, food travels down the esophagus and stays in the stomach to be digested. However, when a person has GER, food and stomach acid sometimes move back up into the esophagus. If this becomes a more serious problem, the person may be diagnosed with a disease called gastroesophageal reflux disease (GERD). GERD occurs when the reflux:  Happens often.  Causes frequent or severe symptoms.  Causes problems such as damage to the esophagus. When stomach acid comes in contact with the esophagus, the acid may cause soreness (inflammation) in the esophagus. Over time, GERD may create small holes (ulcers) in the lining of the esophagus. What are the causes? This condition is caused by a problem with  the muscle between the esophagus and the stomach (lower esophageal sphincter, or LES). Normally, the LES muscle closes after food passes through the esophagus to the stomach. When the LES is weakened or abnormal, it does not close properly, and that allows food and stomach acid to go back up into the esophagus. The LES can be weakened by certain dietary substances, medicines, and medical conditions, including:  Tobacco use.  Pregnancy.  Having a hiatal hernia.  Alcohol use.  Certain foods and beverages, such as coffee, chocolate, onions, and peppermint. What increases the risk? You are more likely to develop this condition if you:  Have an increased body weight.  Have a connective tissue disorder.  Use NSAID medicines. What are the signs or symptoms? Symptoms of this condition include:  Heartburn.  Difficult or painful swallowing.  The feeling of having a lump in the throat.  Abitter taste in the mouth.  Bad breath.  Having a large amount of saliva.  Having an upset or bloated stomach.  Belching.  Chest pain. Different conditions can cause chest pain. Make sure you see your health care provider if you experience chest pain.  Shortness of breath or wheezing.  Ongoing (chronic) cough or a night-time  cough.  Wearing away of tooth enamel.  Weight loss. How is this diagnosed? Your health care provider will take a medical history and perform a physical exam. To determine if you have mild or severe GERD, your health care provider may also monitor how you respond to treatment. You may also have tests, including:  A test to examine your stomach and esophagus with a small camera (endoscopy).  A test thatmeasures the acidity level in your esophagus.  A test thatmeasures how much pressure is on your esophagus.  A barium swallow or modified barium swallow test to show the shape, size, and functioning of your esophagus. How is this treated? The goal of treatment is to  help relieve your symptoms and to prevent complications. Treatment for this condition may vary depending on how severe your symptoms are. Your health care provider may recommend:  Changes to your diet.  Medicine.  Surgery. Follow these instructions at home: Eating and drinking   Follow a diet as recommended by your health care provider. This may involve avoiding foods and drinks such as: ? Coffee and tea (with or without caffeine). ? Drinks that containalcohol. ? Energy drinks and sports drinks. ? Carbonated drinks or sodas. ? Chocolate and cocoa. ? Peppermint and mint flavorings. ? Garlic and onions. ? Horseradish. ? Spicy and acidic foods, including peppers, chili powder, curry powder, vinegar, hot sauces, and barbecue sauce. ? Citrus fruit juices and citrus fruits, such as oranges, lemons, and limes. ? Tomato-based foods, such as red sauce, chili, salsa, and pizza with red sauce. ? Fried and fatty foods, such as donuts, french fries, potato chips, and high-fat dressings. ? High-fat meats, such as hot dogs and fatty cuts of red and white meats, such as rib eye steak, sausage, ham, and bacon. ? High-fat dairy items, such as whole milk, butter, and cream cheese.  Eat small, frequent meals instead of large meals.  Avoid drinking large amounts of liquid with your meals.  Avoid eating meals during the 2-3 hours before bedtime.  Avoid lying down right after you eat.  Do not exercise right after you eat. Lifestyle   Do not use any products that contain nicotine or tobacco, such as cigarettes, e-cigarettes, and chewing tobacco. If you need help quitting, ask your health care provider.  Try to reduce your stress by using methods such as yoga or meditation. If you need help reducing stress, ask your health care provider.  If you are overweight, reduce your weight to an amount that is healthy for you. Ask your health care provider for guidance about a safe weight loss  goal. General instructions  Pay attention to any changes in your symptoms.  Take over-the-counter and prescription medicines only as told by your health care provider. Do not take aspirin, ibuprofen, or other NSAIDs unless your health care provider told you to do so.  Wear loose-fitting clothing. Do not wear anything tight around your waist that causes pressure on your abdomen.  Raise (elevate) the head of your bed about 6 inches (15 cm).  Avoid bending over if this makes your symptoms worse.  Keep all follow-up visits as told by your health care provider. This is important. Contact a health care provider if:  You have: ? New symptoms. ? Unexplained weight loss. ? Difficulty swallowing or it hurts to swallow. ? Wheezing or a persistent cough. ? A hoarse voice.  Your symptoms do not improve with treatment. Get help right away if you:  Have pain in your  arms, neck, jaw, teeth, or back.  Feel sweaty, dizzy, or light-headed.  Have chest pain or shortness of breath.  Vomit and your vomit looks like blood or coffee grounds.  Faint.  Have stool that is bloody or black.  Cannot swallow, drink, or eat. Summary  Gastroesophageal reflux happens when acid from the stomach flows up into the esophagus. GERD is a disease in which the reflux happens often, causes frequent or severe symptoms, or causes problems such as damage to the esophagus.  Treatment for this condition may vary depending on how severe your symptoms are. Your health care provider may recommend diet and lifestyle changes, medicine, or surgery.  Contact a health care provider if you have new or worsening symptoms.  Take over-the-counter and prescription medicines only as told by your health care provider. Do not take aspirin, ibuprofen, or other NSAIDs unless your health care provider told you to do so.  Keep all follow-up visits as told by your health care provider. This is important. This information is not  intended to replace advice given to you by your health care provider. Make sure you discuss any questions you have with your health care provider. Document Released: 08/04/2005 Document Revised: 05/03/2018 Document Reviewed: 05/03/2018 Elsevier Interactive Patient Education  2019 [place]. EGD Discharge instructions Please read the instructions outlined below and refer to this sheet in the next few weeks. These discharge instructions provide you with general information on caring for yourself after you leave the hospital. Your doctor may also give you specific instructions. While your treatment has been planned according to the most current medical practices available, unavoidable complications occasionally occur. If you have any problems or questions after discharge, please call your doctor. ACTIVITY  You may resume your regular activity but move at a slower pace for the next 24 hours.   Take frequent rest periods for the next 24 hours.   Walking will help expel (get rid of) the air and reduce the bloated feeling in your abdomen.   No driving for 24 hours (because of the anesthesia (medicine) used during the test).   You may shower.   Do not sign any important legal documents or operate any machinery for 24 hours (because of the anesthesia used during the test).  NUTRITION  Drink plenty of fluids.   You may resume your normal diet.   Begin with a light meal and progress to your normal diet.   Avoid alcoholic beverages for 24 hours or as instructed by your caregiver.  MEDICATIONS  You may resume your normal medications unless your caregiver tells you otherwise.  WHAT YOU CAN EXPECT TODAY  You may experience abdominal discomfort such as a feeling of fullness or gas pains.  FOLLOW-UP  Your doctor will discuss the results of your test with you.  SEEK IMMEDIATE MEDICAL ATTENTION IF ANY OF THE FOLLOWING OCCUR:  Excessive nausea (feeling sick to your stomach) and/or vomiting.    Severe abdominal pain and distention (swelling).   Trouble swallowing.   Temperature over 101 F (37.8 C).   Rectal bleeding or vomiting of blood.   GERD and hiatal hernia information provided  Continue Dexilant 60 mg daily  Stop Pepcid; try Pepcid Complete 2 tablets in the morning in the evening as needed for breakthrough symptoms of reflux  Office visit with [place] in 3 months

## 2019-02-05 NOTE — Op Note (Signed)
[facility] Patient Name: Shawn [name] Procedure Date: 02/05/2019 10:20 AM MRN: [id number] Date of Birth: December 02, 1970 Attending MD: Norvel Richards , MD CSN: [id number] Age: 48 Admit Type: Outpatient Procedure:                Upper GI endoscopy Indications:              Dysphagia; GERD Providers:                Norvel Richards, MD, Charlsie Quest. Theda Sers RN, RN,                            Raphael Gibney, Aram Candela Referring MD:             Edwinna Areola. Hall MD Medicines:                Propofol per Anesthesia Complications:            No immediate complications. Estimated Blood Loss:     Estimated blood loss: none. Procedure:                Pre-Anesthesia Assessment:                           - Prior to the procedure, a History and Physical                            was performed, and patient medications and                            allergies were reviewed. The patient's tolerance of                            previous anesthesia was also reviewed. The risks                            and benefits of the procedure and the sedation                            options and risks were discussed with the patient.                            All questions were answered, and informed consent                            was obtained. Prior Anticoagulants: The patient has                            taken no previous anticoagulant or antiplatelet                            agents. ASA Grade Assessment: II - A patient with                            mild systemic disease. After reviewing the risks  and benefits, the patient was deemed in                            satisfactory condition to undergo the procedure.                           After obtaining informed consent, the endoscope was                            passed under direct vision. Throughout the                            procedure, the patient's blood pressure, pulse, and    oxygen saturations were monitored continuously. The                            [id number]-H190 ([id number]) scope was introduced through the                            and advanced to the second part of duodenum. The                            upper GI endoscopy was accomplished without                            difficulty. The patient tolerated the procedure                            well. Scope In: 10:41:37 AM Scope Out: 10:46:45 AM Total Procedure Duration: 0 hours 5 minutes 8 seconds  Findings:      Esophagitis was found. Single distal 5 mm erosion at the GE junction. No       Barrett's epithelium seen. Tubular esophagus appeared patent throughout       its course.      A small hiatal hernia was present.      The exam was otherwise without abnormality.      The duodenal bulb and second portion of the duodenum were normal. The       scope was withdrawn. Dilation was performed with a Maloney dilator with       mild resistance at 56 Fr. The dilation site was examined following       endoscope reinsertion and showed no change. Estimated blood loss: none. Impression:               -Nonerosive reflux esophagitis. Dilated.                           - Small hiatal hernia.                           - The examination was otherwise normal.                           - Normal duodenal bulb and second portion of the  duodenum.                           - No specimens collected. Moderate Sedation:      Moderate (conscious) sedation was personally administered by an       anesthesia professional. The following parameters were monitored: oxygen       saturation, heart rate, blood pressure, respiratory rate, EKG, adequacy       of pulmonary ventilation, and response to care. Recommendation:           - Patient has a contact number available for                            emergencies. The signs and symptoms of potential                            delayed complications were  discussed with the                            patient. Return to normal activities tomorrow.                            Written discharge instructions were provided to the                            patient.                           - Resume previous diet.                           - Advance diet as tolerated.                           - Continue present medications. Continue Dexilant                            60 mg daily. Stop Pepcid; use Pepcid Complete 2                            tablets twice a day as needed for breakthrough                            symptoms. Office visit with [place] in 3 months I have                            discussed my findings and recommendations with the                            patient's mother, Dub Mikes, at [phone] Procedure Code(s):        --- Professional ---                           [phone], Esophagogastroduodenoscopy, flexible,  transoral; diagnostic, including collection of                            specimen(s) by brushing or washing, when performed                            (separate procedure)                           43450, Dilation of esophagus, by unguided sound or                            bougie, single or multiple passes Diagnosis Code(s):        --- Professional ---                           K20.9, Esophagitis, unspecified                           K44.9, Diaphragmatic hernia without obstruction or                            gangrene                           R13.10, Dysphagia, unspecified CPT copyright 2019 American Medical Association. All rights reserved. The codes documented in this report are preliminary and upon coder review may  be revised to meet current compliance requirements. Cristopher Estimable. Rourk, MD Norvel Richards, MD 02/05/2019 11:04:26 AM This report has been signed electronically. Number of Addenda: 0

## 2019-02-05 NOTE — Anesthesia Postprocedure Evaluation (Signed)
Anesthesia Post Note  Patient: Shawn [name]  Procedure(s) Performed: ESOPHAGOGASTRODUODENOSCOPY (EGD) WITH PROPOFOL (N/A ) MALONEY DILATION (N/A )  Patient location during evaluation: PACU Anesthesia Type: MAC Level of consciousness: awake and alert and oriented Pain management: pain level controlled Vital Signs Assessment: post-procedure vital signs reviewed and stable Respiratory status: spontaneous breathing Cardiovascular status: stable Postop Assessment: no apparent nausea or vomiting Anesthetic complications: no     Last Vitals:  Vitals:   02/05/19 0937  BP: 118/80  Pulse: (!) 50  Resp: 16  Temp: (!) 36.4 C  SpO2: 97%    Last Pain:  Vitals:   02/05/19 1037  TempSrc:   PainSc: 0-No pain                 Marilin Kofman A

## 2019-02-05 NOTE — Anesthesia Preprocedure Evaluation (Signed)
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     Dental Advisory Given  Plan Discussed with: Anesthesiologist  Anesthesia Plan Comments:         Anesthesia Quick Evaluation

## 2019-02-07 ENCOUNTER — Encounter (HOSPITAL_COMMUNITY): Payer: Self-pay | Admitting: Internal Medicine

## 2019-03-05 ENCOUNTER — Telehealth: Payer: Self-pay

## 2019-03-05 ENCOUNTER — Telehealth: Payer: Self-pay | Admitting: Internal Medicine

## 2019-03-05 MED ORDER — CIMETIDINE 800 MG PO TABS: 400.0000 mg | ORAL_TABLET | Freq: Two times a day (BID) | ORAL | 5 refills | Status: DC

## 2019-03-05 NOTE — Telephone Encounter (Signed)
Pt called with c/o vomiting. Pt had an EGA on 02/05/19. Pt vomits 3-4 times weekly since his EGD. Pt doesn't feel like the EGD is the cause of his vomiting. He states that the color can be an egg yolk color and pt suffers with reflux. Pt takes Dexilant 60 mg daily and wasn't able to purchase the Pepcid since insurance will not cover it. Pt also says it's too expensive for him to buy OCT. Pt drunk apple cider vinegar mixed with water last night. When pt vomited last night, he felt the apple cider vinegar came back up with a red spot in it. Pt is concerned that it was a spot of blood in the vomit. Pt has an RX at home that EG gave him called Lidocaine 2% suspension that he doesn't use.

## 2019-03-05 NOTE — Telephone Encounter (Addendum)
Patient had EGD with mild erosive reflux esophagitis 02/05/2019.   1. Would monitor for further blood in emesis or black stools. Likely due to irritation from vomiting.  2. Continue Dexilant 60mg  daily. 3. Take cemitidine 800mg , take 1/2 tablet twice daily. RX sent to [facility].  4. Stop Pepcid. 5. IS HE HAVING ANY ABDOMINAL PAIN AFTER MEALS? 6. 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 7. Make virtual visit for 2 weeks.

## 2019-03-05 NOTE — Telephone Encounter (Signed)
See other phone note for documentation.  

## 2019-03-05 NOTE — Telephone Encounter (Signed)
Pt called asking to speak with the nurse or the doctor. I told him that I would have to take a message. He said it was an emergency. I asked again what was it regarding to. He said, "my health". I transferred call to AM VM.

## 2019-03-05 NOTE — Addendum Note (Signed)
Addended by: Mahala Menghini on: 03/05/2019 11:43 AM   Modules accepted: Orders

## 2019-03-05 NOTE — Telephone Encounter (Signed)
Pt notified of LSL recommendations, mediation was sent to pts pharmacy and ov was made. Telephone ov made for 2 weeks. Pt was offered Facetime and Zoom visit.

## 2019-03-19 ENCOUNTER — Encounter: Payer: Self-pay | Admitting: Gastroenterology

## 2019-03-19 ENCOUNTER — Ambulatory Visit (INDEPENDENT_AMBULATORY_CARE_PROVIDER_SITE_OTHER): Payer: Medicaid Other | Admitting: Gastroenterology

## 2019-03-19 ENCOUNTER — Other Ambulatory Visit: Payer: Self-pay

## 2019-03-19 DIAGNOSIS — K2100 Gastro-esophageal reflux disease with esophagitis, without bleeding: Secondary | ICD-10-CM

## 2019-03-19 DIAGNOSIS — R112 Nausea with vomiting, unspecified: Secondary | ICD-10-CM | POA: Diagnosis not present

## 2019-03-19 NOTE — Progress Notes (Signed)
Primary Care Physician:  Celene Squibb, MD Primary GI: Dr. Gala Romney   Patient Location: Home  Provider Location: [facility] office  Reason for Visit: Nausea and vomiting  Persons present on the virtual encounter, with roles: Patient, myself (provider), Zara Council, LPN (updated meds and allergies)  Total time (minutes) spent on medical discussion: 20 minutes  Due to COVID-19, visit was conducted using [email or web address] method.  Visit was requested by patient.  Virtual Visit via [email or web address]  I connected with Shawn [name] on 03/19/19 at  9:30 AM EDT by [email or web address] and verified that I am speaking with the correct person using two identifiers.   I discussed the limitations, risks, security and privacy concerns of performing an evaluation and management service by telephone/video and the availability of in person appointments. I also discussed with the patient that there may be a patient responsible charge related to this service. The patient expressed understanding and agreed to proceed.   HPI:   Shawn [name] is a 48 y.o. male who presents for virtual visit regarding nausea and vomiting.  Patient was seen in the office in February for refractory GERD, vomiting.  Chronically on Dexilant.  When I saw him he started having frequent heartburn, vomiting, epigastric pain, dysphagia.  Patient has a history of elevated alpha gal previously, but refusing to avoid appropriate foods.  Also on Celebrex twice daily, Voltaren daily as needed, Advil 800 mg every 8 hours as needed at that time.  EGD in March showed mild erosive reflux esophagitis, small hiatal hernia.  Esophagus dilated due to history of dysphagia.  Patient called in on April 27 with complaints of vomiting 3-4 times weekly.  Mild hematemesis, "a spot of blood in the vomit".  He was not able to purchase the Pepcid over-the-counter suggested after EGD.  Will prescribe cimetidine 800 mg to take half a tablet twice daily along with his Dexilant 60 mg daily.  Patient states he was never able to get the cimetidine, [facility] pharmacy told him that Medicaid will not cover it since it is over-the-counter.  We were not notified by pharmacy or patient regarding this.    Since we last saw him he the following medications are no longer on his drug list.  Vraylar, Cymbalta, Celexa Prozac, Lamictal, Risperdal, oxycodone.  Patient states he actually stopped taking this over a year ago as his psychiatrist will no longer provide him with the medication.  Heartburn doing okay. Vomiting every day.  Almost always happens in the morning before eating. No nocturnal symptoms.  Rarely has vomiting after meals.  Right lower abdominal pain sometimes. Lasting for few minutes and eases off with BM. Has been having some diarrhea. Usually more soft stools at baseline. Yesterday, first time regular, and subsequent stools were softer and looser. 4-5 times yesterday.  Diarrhea happens off and on but usually short-lived.  No melena, brbpr.   Was on metformin in the past for "weight gain".  Denies history of diabetes.  Walks about 18 to 20 miles a day reportedly.  States that [facility] Portela has been giving him 30-day supply of medication instead of 90.  Back in March he had 10 refills left and when he refilled in April it only said 5 refills.    Current Outpatient Medications  Medication Sig Dispense Refill  . azaTHIOprine (IMURAN) 50 MG tablet Take 100 mg by mouth daily.    . celecoxib (CELEBREX) 200 MG capsule Take 1 capsule (200 mg total) by mouth 2 (two) times daily.  60 capsule 0  . Cyanocobalamin (VITAMIN B-12) 5000 MCG TBDP Take 5,000 mcg by mouth daily.    . cyclobenzaprine (FLEXERIL) 10 MG tablet Take 1 tablet by mouth 3 (three) times daily.    Marland Kitchen dexlansoprazole (DEXILANT) 60 MG capsule Take 1 capsule (60 mg total) by mouth daily. 90 capsule 3  . diclofenac (VOLTAREN) 75 MG EC tablet Take 75 mg by mouth as needed.    . gabapentin (NEURONTIN) 300 MG capsule Take 300 mg by mouth 3  (three) times daily.     Marland Kitchen ibuprofen (ADVIL,MOTRIN) 800 MG tablet Take 800 mg by mouth every 8 (eight) hours as needed.    . Multiple Vitamin (MULTIVITAMIN) tablet Take 1 tablet by mouth daily.    . Potassium 99 MG TABS Take 1 tablet by mouth every morning.    . trazodone (DESYREL) 300 MG tablet Take 450 mg by mouth at bedtime.     . hydrOXYzine (VISTARIL) 50 MG capsule Take 1 capsule by mouth at bedtime.     No current facility-administered medications for this visit.     ROS:  General: Negative for anorexia, weight loss, fever, chills, fatigue, weakness. Eyes: Negative for vision changes.  ENT: Negative for hoarseness, difficulty swallowing , nasal congestion. CV: Negative for chest pain, angina, palpitations, dyspnea on exertion, peripheral edema.  Respiratory: Negative for dyspnea at rest, dyspnea on exertion, cough, sputum, wheezing.  GI: See history of present illness. GU:  Negative for dysuria, hematuria, urinary incontinence, urinary frequency, nocturnal urination.  MS: Negative for joint pain, low back pain.  Derm: Negative for rash or itching.  Neuro: Negative for weakness, abnormal sensation, seizure, frequent headaches, memory loss, confusion.  Psych: Negative for anxiety, depression, suicidal ideation, hallucinations.  Endo: Negative for unusual weight change.  Heme: Negative for bruising or bleeding. Allergy: Negative for rash or hives.   Observations/Objective: Pleasant well-nourished well-developed male in no acute distress, otherwise exam unavailable.   Lab Results  Component Value Date   CREATININE 1.01 11/23/2018   BUN 17 11/23/2018   NA 140 11/23/2018   K 4.4 11/23/2018   CL 107 11/23/2018   CO2 25 11/23/2018   Lab Results  Component Value Date   LIPASE 40 11/23/2018   Lab Results  Component Value Date   WBC 8.4 11/23/2018   HGB 14.2 11/23/2018   HCT 43.1 11/23/2018   MCV 95.6 11/23/2018   PLT 218 11/23/2018    Assessment and Plan: Pleasant  48 year old gentleman presenting for frequent early morning nausea and vomiting.  Typical heartburn well controlled.  Routinely does not have any issues with meals.  Suspect vomiting due to breakthrough reflux symptoms.  He has been unable to obtain over-the-counter H2 blockers due to financial reasons.  Cimetidine reportedly was not paid for by Medicaid because it was also over-the-counter.  We will touch base with pharmacy to determine why patient has not been receiving 90-day supplies of Dexilant as well as to find out what alternatives we have for H2 blockers that would be paid for by his Medicaid.  If none available, we will switch his Dexilant to before his evening meal to see if this helps with morning vomiting.   Follow Up Instructions:    I discussed the assessment and treatment plan with the patient. The patient was provided an opportunity to ask questions and all were answered. The patient agreed with the plan and demonstrated an understanding of the instructions. AVS mailed to patient's home address.   The patient was  advised to call back or seek an in-person evaluation if the symptoms worsen or if the condition fails to improve as anticipated.  I provided 20 minutes of virtual face-to-face time during this encounter.   Neil Crouch, PA-C

## 2019-03-19 NOTE — Patient Instructions (Signed)
1. We will contact [place] regarding concerns for your Dexilant prescription (the fact that you are not getting 90-day supply as prescribed) as well as to determine if there are any other options for reflux that will be paid for by Medicaid to add to the [place] to help with your vomiting. 2. We will plan to see you back in the office in 3 months but call sooner if you are having any problems.

## 2019-03-19 NOTE — Progress Notes (Signed)
CC'D TO PCP °

## 2019-03-20 ENCOUNTER — Telehealth: Payer: Self-pay

## 2019-03-20 NOTE — Telephone Encounter (Signed)
LSL, I spoke with pt's pharmacy and Dexilant 60 mg is dispensed 30 pills due to Medicaid not covering anything besides 30 pills a month. Preferred PPI are Esomeprazole, Nexium, Pantoprazole and Protonix suspension.

## 2019-03-20 NOTE — Telephone Encounter (Signed)
Noted. I am looking to see if we can keep him on Dexilant and add an H2 blocker. Remind me the website for [facility] formulary and I will look to see additional options.

## 2019-03-21 MED ORDER — FAMOTIDINE 40 MG PO TABS: 40.0000 mg | ORAL_TABLET | Freq: Every day | ORAL | 5 refills | Status: DC

## 2019-03-21 NOTE — Telephone Encounter (Signed)
Noted. [email or web address]

## 2019-03-21 NOTE — Addendum Note (Signed)
Addended by: Mahala Menghini on: 03/21/2019 12:21 PM   Modules accepted: Orders

## 2019-03-21 NOTE — Telephone Encounter (Signed)
Noted. Pt notified that medication was sent to pts pharmacy.

## 2019-03-21 NOTE — Telephone Encounter (Addendum)
I have sent in famotidine 40mg  at bedtime #30 with five refills to [facility]. Per Medicaid this is on formulary.   Please let patient know if he has trouble getting it then to let us know.   Also let patient know that medicaid does not allow 90 day supplies.

## 2019-05-15 ENCOUNTER — Ambulatory Visit: Payer: Medicaid Other | Admitting: Gastroenterology

## 2019-06-13 ENCOUNTER — Encounter: Payer: Self-pay | Admitting: Internal Medicine

## 2019-08-08 ENCOUNTER — Encounter: Payer: Self-pay | Admitting: Internal Medicine

## 2019-08-24 ENCOUNTER — Ambulatory Visit: Payer: Medicaid Other | Admitting: Internal Medicine

## 2019-08-24 ENCOUNTER — Other Ambulatory Visit: Payer: Self-pay

## 2019-08-24 ENCOUNTER — Encounter: Payer: Self-pay | Admitting: Internal Medicine

## 2019-08-24 VITALS — BP 116/73 | HR 83 | Temp 97.1°F | Ht 70.0 in | Wt 169.0 lb

## 2019-08-24 DIAGNOSIS — Z860100 Personal history of colon polyps, unspecified: Secondary | ICD-10-CM

## 2019-08-24 DIAGNOSIS — K2100 Gastro-esophageal reflux disease with esophagitis, without bleeding: Secondary | ICD-10-CM

## 2019-08-24 NOTE — Patient Instructions (Signed)
Continue Dexilant 60 mg daily (disp 90 with 3 refills)  GERD information  May use Pepsid Complete as needed  Colonoscopy 2023  OV here in 1 year
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
## 2019-08-29 ENCOUNTER — Other Ambulatory Visit: Payer: Self-pay

## 2019-08-29 DIAGNOSIS — R1033 Periumbilical pain: Secondary | ICD-10-CM

## 2019-08-29 DIAGNOSIS — K219 Gastro-esophageal reflux disease without esophagitis: Secondary | ICD-10-CM

## 2019-08-29 DIAGNOSIS — R7989 Other specified abnormal findings of blood chemistry: Secondary | ICD-10-CM

## 2019-08-29 MED ORDER — DEXILANT 60 MG PO CPDR: 60.0000 mg | DELAYED_RELEASE_CAPSULE | Freq: Every day | ORAL | 3 refills | Status: DC

## 2019-12-25 ENCOUNTER — Telehealth: Payer: Self-pay | Admitting: Internal Medicine

## 2019-12-25 ENCOUNTER — Telehealth: Payer: Self-pay

## 2019-12-25 NOTE — Telephone Encounter (Signed)
[phone] PATIENT CALLED STATING HIS PHARMACY DENIED HIS [place].  HE STATED HE IS ALMOST OUT AND NEEDS IT

## 2019-12-26 ENCOUNTER — Telehealth: Payer: Self-pay | Admitting: Internal Medicine

## 2019-12-26 NOTE — Telephone Encounter (Signed)
Pt called to see if we had called in a prescription for his Dexilant. He is complaining about having bad reflux and needs some relief. Please advise. [phone]

## 2019-12-26 NOTE — Telephone Encounter (Signed)
PA has been completed through [facility] via phone [phone]. Pt notified of approval of [place] for 1 year. Approval letter will be scanned in pts chart.

## 2020-01-07 NOTE — Telephone Encounter (Signed)
ERROR

## 2020-09-08 ENCOUNTER — Encounter: Payer: Self-pay | Admitting: Internal Medicine

## 2022-01-11 DIAGNOSIS — R972 Elevated prostate specific antigen [PSA]: Secondary | ICD-10-CM | POA: Insufficient documentation

## 2022-01-11 DIAGNOSIS — I10 Essential (primary) hypertension: Secondary | ICD-10-CM | POA: Insufficient documentation

## 2022-01-14 ENCOUNTER — Encounter: Payer: Self-pay | Admitting: *Deleted

## 2022-01-19 DIAGNOSIS — F4310 Post-traumatic stress disorder, unspecified: Secondary | ICD-10-CM | POA: Insufficient documentation

## 2022-01-19 DIAGNOSIS — M545 Low back pain, unspecified: Secondary | ICD-10-CM | POA: Insufficient documentation

## 2022-03-30 DIAGNOSIS — M25472 Effusion, left ankle: Secondary | ICD-10-CM | POA: Insufficient documentation

## 2022-07-21 DIAGNOSIS — G5600 Carpal tunnel syndrome, unspecified upper limb: Secondary | ICD-10-CM | POA: Insufficient documentation

## 2022-09-01 HISTORY — PX: KNEE SURGERY: SHX244

## 2022-09-03 DIAGNOSIS — S22059A Unspecified fracture of T5-T6 vertebra, initial encounter for closed fracture: Secondary | ICD-10-CM | POA: Insufficient documentation

## 2022-09-03 DIAGNOSIS — S72321A Displaced transverse fracture of shaft of right femur, initial encounter for closed fracture: Secondary | ICD-10-CM | POA: Insufficient documentation

## 2022-11-15 ENCOUNTER — Encounter: Payer: Self-pay | Admitting: Orthopedic Surgery

## 2022-11-15 ENCOUNTER — Other Ambulatory Visit: Payer: Self-pay | Admitting: Orthopedic Surgery

## 2022-11-15 ENCOUNTER — Ambulatory Visit (INDEPENDENT_AMBULATORY_CARE_PROVIDER_SITE_OTHER): Payer: Medicaid Other

## 2022-11-15 ENCOUNTER — Ambulatory Visit (INDEPENDENT_AMBULATORY_CARE_PROVIDER_SITE_OTHER): Payer: Medicaid Other | Admitting: Orthopedic Surgery

## 2022-11-15 VITALS — BP 119/86 | HR 85 | Ht 70.0 in

## 2022-11-15 DIAGNOSIS — S72401A Unspecified fracture of lower end of right femur, initial encounter for closed fracture: Secondary | ICD-10-CM

## 2022-11-15 DIAGNOSIS — M25561 Pain in right knee: Secondary | ICD-10-CM

## 2022-11-15 DIAGNOSIS — Z7409 Other reduced mobility: Secondary | ICD-10-CM | POA: Insufficient documentation

## 2022-11-15 DIAGNOSIS — G8929 Other chronic pain: Secondary | ICD-10-CM
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
## 2022-11-15 NOTE — Patient Instructions (Signed)
[facility] you can call them to schedule the number is [phone].

## 2022-12-07 ENCOUNTER — Ambulatory Visit (HOSPITAL_COMMUNITY): Payer: Medicaid Other | Attending: Family Medicine | Admitting: Physical Therapy

## 2022-12-07 DIAGNOSIS — R2689 Other abnormalities of gait and mobility: Secondary | ICD-10-CM

## 2022-12-07 DIAGNOSIS — M25561 Pain in right knee: Secondary | ICD-10-CM | POA: Insufficient documentation
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
## 2022-12-09 ENCOUNTER — Ambulatory Visit (HOSPITAL_COMMUNITY): Payer: Medicaid Other | Attending: Family Medicine | Admitting: Physical Therapy

## 2022-12-09 ENCOUNTER — Encounter (HOSPITAL_COMMUNITY): Payer: Self-pay | Admitting: Physical Therapy

## 2022-12-09 DIAGNOSIS — R2689 Other abnormalities of gait and mobility: Secondary | ICD-10-CM | POA: Diagnosis present

## 2022-12-09 DIAGNOSIS — M25561 Pain in right knee: Secondary | ICD-10-CM | POA: Insufficient documentation
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
## 2022-12-14 ENCOUNTER — Ambulatory Visit (HOSPITAL_COMMUNITY): Payer: Medicaid Other | Admitting: Physical Therapy

## 2022-12-14 ENCOUNTER — Encounter (HOSPITAL_COMMUNITY): Payer: Self-pay

## 2022-12-14 ENCOUNTER — Telehealth (HOSPITAL_COMMUNITY): Payer: Self-pay | Admitting: Physical Therapy

## 2022-12-14 NOTE — Telephone Encounter (Signed)
Pt did not show for appt.  Called and left VM regarding missed appt and reminder for next one scheduled 2/8 at 3:15.  Teena Irani, PTA/CLT [place] Ph: [phone]

## 2022-12-16 ENCOUNTER — Ambulatory Visit (HOSPITAL_COMMUNITY): Payer: Medicaid Other | Admitting: Physical Therapy

## 2022-12-16 DIAGNOSIS — R2689 Other abnormalities of gait and mobility: Secondary | ICD-10-CM

## 2022-12-16 DIAGNOSIS — M25561 Pain in right knee: Secondary | ICD-10-CM
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
## 2022-12-22 ENCOUNTER — Ambulatory Visit (HOSPITAL_COMMUNITY): Payer: Medicaid Other | Admitting: Physical Therapy

## 2022-12-22 DIAGNOSIS — R2689 Other abnormalities of gait and mobility: Secondary | ICD-10-CM

## 2022-12-22 DIAGNOSIS — M25561 Pain in right knee: Secondary | ICD-10-CM
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
## 2022-12-28 ENCOUNTER — Ambulatory Visit (HOSPITAL_COMMUNITY): Payer: Medicaid Other | Admitting: Physical Therapy

## 2022-12-28 DIAGNOSIS — R2689 Other abnormalities of gait and mobility: Secondary | ICD-10-CM | POA: Diagnosis not present

## 2022-12-28 DIAGNOSIS — M25561 Pain in right knee: Secondary | ICD-10-CM
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
## 2022-12-30 ENCOUNTER — Ambulatory Visit (HOSPITAL_COMMUNITY): Payer: Medicaid Other | Admitting: Physical Therapy

## 2022-12-30 ENCOUNTER — Encounter (HOSPITAL_COMMUNITY): Payer: Self-pay | Admitting: Physical Therapy

## 2022-12-30 DIAGNOSIS — R2689 Other abnormalities of gait and mobility: Secondary | ICD-10-CM | POA: Diagnosis not present

## 2022-12-30 DIAGNOSIS — M25561 Pain in right knee: Secondary | ICD-10-CM
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
## 2023-01-06 ENCOUNTER — Encounter: Payer: Self-pay | Admitting: Radiology

## 2023-01-10 ENCOUNTER — Encounter (HOSPITAL_COMMUNITY): Payer: Medicaid Other | Admitting: Physical Therapy

## 2023-01-10 ENCOUNTER — Encounter (HOSPITAL_COMMUNITY): Payer: Self-pay

## 2023-01-13 ENCOUNTER — Encounter (HOSPITAL_COMMUNITY): Payer: Medicaid Other | Admitting: Physical Therapy

## 2023-01-13 ENCOUNTER — Telehealth (HOSPITAL_COMMUNITY): Payer: Self-pay | Admitting: Physical Therapy

## 2023-01-13 NOTE — Telephone Encounter (Signed)
Called patient about missed visits. Patient states he has no transportation this week. Instructed patient to schedule more visits if wanting to continue with therapy otherwise we will DC this episode.   4:33 PM, 01/13/23 Josue Hector PT DPT  Physical Therapist with [facility]  [phone]
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
## 2023-03-21 ENCOUNTER — Encounter: Payer: Self-pay | Admitting: Internal Medicine

## 2023-03-21 ENCOUNTER — Ambulatory Visit
Admission: RE | Admit: 2023-03-21 | Discharge: 2023-03-21 | Disposition: A | Discharge status: Home / Self Care | Payer: Medicaid Other | Source: Ambulatory Visit | Attending: Internal Medicine | Admitting: Internal Medicine

## 2023-03-21 ENCOUNTER — Ambulatory Visit: Payer: 59 | Attending: Family Medicine | Admitting: Internal Medicine

## 2023-03-21 VITALS — BP 110/70 | HR 76 | Resp 16 | Ht 68.5 in | Wt 207.0 lb

## 2023-03-21 DIAGNOSIS — J449 Chronic obstructive pulmonary disease, unspecified: Secondary | ICD-10-CM | POA: Insufficient documentation

## 2023-03-21 DIAGNOSIS — M25471 Effusion, right ankle: Secondary | ICD-10-CM | POA: Diagnosis not present

## 2023-03-21 DIAGNOSIS — D8984 IgG4-related disease: Secondary | ICD-10-CM | POA: Diagnosis not present

## 2023-03-21 DIAGNOSIS — M25472 Effusion, left ankle: Secondary | ICD-10-CM | POA: Insufficient documentation

## 2023-03-21 DIAGNOSIS — M25551 Pain in right hip: Secondary | ICD-10-CM | POA: Diagnosis not present

## 2023-03-21 DIAGNOSIS — R918 Other nonspecific abnormal finding of lung field: Secondary | ICD-10-CM

## 2023-03-21 DIAGNOSIS — M25475 Effusion, left foot: Secondary | ICD-10-CM | POA: Insufficient documentation

## 2023-03-21 DIAGNOSIS — M25474 Effusion, right foot: Secondary | ICD-10-CM | POA: Insufficient documentation

## 2023-03-21 LAB — CBC WITH DIFFERENTIAL/PLATELET
Basophils Absolute: 72 cells/uL (ref 0–200)
Neutro Abs: 3660 cells/uL (ref 1500–7800)
RBC: 4.92 10*6/uL (ref 4.20–5.80)

## 2023-03-21 MED ORDER — CYCLOBENZAPRINE HCL 10 MG PO TABS
10.0000 mg | ORAL_TABLET | Freq: Every evening | ORAL | 0 refills | Status: DC | PRN
Start: 2023-03-21 — End: 2023-09-19

## 2023-03-22 LAB — COMPLETE METABOLIC PANEL WITH GFR
AST: 16 U/L (ref 10–35)
Alkaline phosphatase (APISO): 53 U/L (ref 35–144)
Globulin: 3.3 g/dL (calc) (ref 1.9–3.7)
eGFR: 83 mL/min/{1.73_m2} (ref >=60)

## 2023-03-22 LAB — C-REACTIVE PROTEIN: CRP: <3 mg/L (ref <8.0)

## 2023-03-22 LAB — CBC WITH DIFFERENTIAL/PLATELET
Basophils Relative: 1.2 %
Eosinophils Relative: 2.2 %
Monocytes Relative: 6.2 %
RDW: 12 % (ref 11.0–15.0)
WBC: 6 10*3/uL (ref 3.8–10.8)

## 2023-03-22 LAB — SEDIMENTATION RATE: Sed Rate: 2 mm/h (ref 0–20)

## 2023-03-22 LAB — IGG, IGA, IGM: Immunoglobulin A: 226 mg/dL (ref 47–310)

## 2023-03-24 LAB — COMPLETE METABOLIC PANEL WITH GFR
AG Ratio: 1.4 (calc) (ref 1.0–2.5)
ALT: 18 U/L (ref 9–46)
Albumin: 4.5 g/dL (ref 3.6–5.1)
BUN: 15 mg/dL (ref 7–25)
CO2: 28 mmol/L (ref 20–32)
Calcium: 9.6 mg/dL (ref 8.6–10.3)
Chloride: 105 mmol/L (ref 98–110)
Creat: 1.07 mg/dL (ref 0.70–1.30)
Glucose, Bld: 91 mg/dL (ref 65–99)
Potassium: 4.7 mmol/L (ref 3.5–5.3)
Sodium: 140 mmol/L (ref 135–146)
Total Bilirubin: 0.3 mg/dL (ref 0.2–1.2)
Total Protein: 7.8 g/dL (ref 6.1–8.1)

## 2023-03-24 LAB — RHEUMATOID FACTOR: Rheumatoid fact SerPl-aCnc: 10 IU/mL (ref <14)

## 2023-03-24 LAB — IGG, IGA, IGM
IgG (Immunoglobin G), Serum: 1540 mg/dL (ref 600–1640)
IgM, Serum: 106 mg/dL (ref 50–300)

## 2023-03-24 LAB — CBC WITH DIFFERENTIAL/PLATELET
Absolute Monocytes: 372 cells/uL (ref 200–950)
Eosinophils Absolute: 132 cells/uL (ref 15–500)
HCT: 45 % (ref 38.5–50.0)
Hemoglobin: 14.6 g/dL (ref 13.2–17.1)
Lymphs Abs: 1764 cells/uL (ref 850–3900)
MCH: 29.7 pg (ref 27.0–33.0)
MCHC: 32.4 g/dL (ref 32.0–36.0)
MCV: 91.5 fL (ref 80.0–100.0)
MPV: 11.1 fL (ref 7.5–12.5)
Neutrophils Relative %: 61 %
Platelets: 258 10*3/uL (ref 140–400)
Total Lymphocyte: 29.4 %

## 2023-03-24 LAB — IGG SUBCLASS 4: IgG Subclass 4: 44.3 mg/dL (ref 4.0–86.0)

## 2023-04-19 ENCOUNTER — Ambulatory Visit: Payer: Medicaid Other | Admitting: Internal Medicine
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
## 2023-04-21 DIAGNOSIS — S199XXA Unspecified injury of neck, initial encounter: Secondary | ICD-10-CM | POA: Diagnosis not present

## 2023-04-21 DIAGNOSIS — S299XXA Unspecified injury of thorax, initial encounter: Secondary | ICD-10-CM | POA: Diagnosis not present

## 2023-04-21 DIAGNOSIS — S0993XA Unspecified injury of face, initial encounter: Secondary | ICD-10-CM | POA: Diagnosis not present

## 2023-04-21 DIAGNOSIS — M542 Cervicalgia: Secondary | ICD-10-CM | POA: Diagnosis not present

## 2023-04-21 DIAGNOSIS — Z041 Encounter for examination and observation following transport accident: Secondary | ICD-10-CM | POA: Diagnosis not present

## 2023-04-21 DIAGNOSIS — M47812 Spondylosis without myelopathy or radiculopathy, cervical region: Secondary | ICD-10-CM | POA: Diagnosis not present

## 2023-04-21 DIAGNOSIS — S0181XA Laceration without foreign body of other part of head, initial encounter: Secondary | ICD-10-CM | POA: Diagnosis not present

## 2023-04-21 DIAGNOSIS — Y929 Unspecified place or not applicable: Secondary | ICD-10-CM | POA: Diagnosis not present

## 2023-04-29 DIAGNOSIS — T1490XD Injury, unspecified, subsequent encounter: Secondary | ICD-10-CM | POA: Diagnosis not present

## 2023-04-29 DIAGNOSIS — M25551 Pain in right hip: Secondary | ICD-10-CM | POA: Diagnosis not present

## 2023-04-29 DIAGNOSIS — F4310 Post-traumatic stress disorder, unspecified: Secondary | ICD-10-CM | POA: Diagnosis not present

## 2023-04-29 DIAGNOSIS — F319 Bipolar disorder, unspecified: Secondary | ICD-10-CM | POA: Diagnosis not present

## 2023-04-29 DIAGNOSIS — M542 Cervicalgia: Secondary | ICD-10-CM | POA: Diagnosis not present

## 2023-04-29 DIAGNOSIS — Z4802 Encounter for removal of sutures: Secondary | ICD-10-CM | POA: Diagnosis not present

## 2023-04-29 DIAGNOSIS — M069 Rheumatoid arthritis, unspecified: Secondary | ICD-10-CM | POA: Diagnosis not present

## 2023-04-29 DIAGNOSIS — Z713 Dietary counseling and surveillance: Secondary | ICD-10-CM | POA: Diagnosis not present

## 2023-04-29 DIAGNOSIS — I10 Essential (primary) hypertension: Secondary | ICD-10-CM | POA: Diagnosis not present

## 2023-04-29 DIAGNOSIS — S0181XD Laceration without foreign body of other part of head, subsequent encounter: Secondary | ICD-10-CM | POA: Diagnosis not present

## 2023-05-27 ENCOUNTER — Ambulatory Visit: Payer: Medicaid Other | Admitting: Internal Medicine
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
## 2023-06-28 DIAGNOSIS — H04123 Dry eye syndrome of bilateral lacrimal glands: Secondary | ICD-10-CM | POA: Diagnosis not present

## 2023-07-18 DIAGNOSIS — R7303 Prediabetes: Secondary | ICD-10-CM | POA: Diagnosis not present

## 2023-07-18 DIAGNOSIS — E782 Mixed hyperlipidemia: Secondary | ICD-10-CM | POA: Diagnosis not present

## 2023-07-22 DIAGNOSIS — M25561 Pain in right knee: Secondary | ICD-10-CM | POA: Diagnosis not present

## 2023-07-22 DIAGNOSIS — E782 Mixed hyperlipidemia: Secondary | ICD-10-CM | POA: Diagnosis not present

## 2023-07-22 DIAGNOSIS — M542 Cervicalgia: Secondary | ICD-10-CM | POA: Diagnosis not present

## 2023-07-22 DIAGNOSIS — R7303 Prediabetes: Secondary | ICD-10-CM | POA: Diagnosis not present

## 2023-07-22 DIAGNOSIS — F319 Bipolar disorder, unspecified: Secondary | ICD-10-CM | POA: Diagnosis not present

## 2023-07-22 DIAGNOSIS — M069 Rheumatoid arthritis, unspecified: Secondary | ICD-10-CM | POA: Diagnosis not present

## 2023-07-22 DIAGNOSIS — M7989 Other specified soft tissue disorders: Secondary | ICD-10-CM | POA: Diagnosis not present

## 2023-07-22 DIAGNOSIS — F3171 Bipolar disorder, in partial remission, most recent episode hypomanic: Secondary | ICD-10-CM | POA: Diagnosis not present

## 2023-07-22 DIAGNOSIS — F4310 Post-traumatic stress disorder, unspecified: Secondary | ICD-10-CM | POA: Diagnosis not present

## 2023-07-22 DIAGNOSIS — I10 Essential (primary) hypertension: Secondary | ICD-10-CM | POA: Diagnosis not present

## 2023-07-22 DIAGNOSIS — F250 Schizoaffective disorder, bipolar type: Secondary | ICD-10-CM | POA: Diagnosis not present

## 2023-07-22 DIAGNOSIS — M25551 Pain in right hip: Secondary | ICD-10-CM | POA: Diagnosis not present
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
## 2023-07-28 ENCOUNTER — Ambulatory Visit (INDEPENDENT_AMBULATORY_CARE_PROVIDER_SITE_OTHER): Payer: 59 | Admitting: Orthopedic Surgery

## 2023-07-28 ENCOUNTER — Encounter: Payer: Self-pay | Admitting: Orthopedic Surgery

## 2023-07-28 ENCOUNTER — Ambulatory Visit (INDEPENDENT_AMBULATORY_CARE_PROVIDER_SITE_OTHER): Payer: Self-pay

## 2023-07-28 VITALS — BP 132/83 | HR 86 | Ht 71.0 in | Wt 215.0 lb

## 2023-07-28 DIAGNOSIS — M25561 Pain in right knee: Secondary | ICD-10-CM | POA: Diagnosis not present

## 2023-07-28 DIAGNOSIS — Z8781 Personal history of (healed) traumatic fracture: Secondary | ICD-10-CM

## 2023-07-28 DIAGNOSIS — M545 Low back pain, unspecified: Secondary | ICD-10-CM | POA: Diagnosis not present

## 2023-07-28 DIAGNOSIS — G8929 Other chronic pain: Secondary | ICD-10-CM | POA: Diagnosis not present

## 2023-07-28 DIAGNOSIS — S72401A Unspecified fracture of lower end of right femur, initial encounter for closed fracture: Secondary | ICD-10-CM

## 2023-07-28 MED ORDER — PREDNISONE 10 MG (48) PO TBPK: ORAL_TABLET | Freq: Every day | ORAL | 0 refills | Status: DC

## 2023-07-28 NOTE — Patient Instructions (Addendum)
Take the prednisone for 12 days, hold the anti inflammatories (meloxicam) you can take the Meloxicam again when you are done with the prednisone   Physical therapy has been ordered for you at [facility]. They should call you to schedule, [phone] is the phone number to call, if you want to call to schedule.

## 2023-07-29 ENCOUNTER — Ambulatory Visit (INDEPENDENT_AMBULATORY_CARE_PROVIDER_SITE_OTHER): Payer: 59 | Admitting: Internal Medicine

## 2023-07-29 ENCOUNTER — Ambulatory Visit (HOSPITAL_COMMUNITY)
Admission: RE | Admit: 2023-07-29 | Discharge: 2023-07-29 | Disposition: A | Discharge status: Home / Self Care | Payer: 59 | Source: Ambulatory Visit | Attending: Internal Medicine | Admitting: Internal Medicine

## 2023-07-29 ENCOUNTER — Encounter: Payer: Self-pay | Admitting: Internal Medicine

## 2023-07-29 VITALS — BP 126/96 | HR 112 | Wt 214.6 lb

## 2023-07-29 DIAGNOSIS — R0609 Other forms of dyspnea: Secondary | ICD-10-CM | POA: Diagnosis not present

## 2023-07-29 DIAGNOSIS — R059 Cough, unspecified: Secondary | ICD-10-CM | POA: Diagnosis not present

## 2023-07-29 DIAGNOSIS — J449 Chronic obstructive pulmonary disease, unspecified: Secondary | ICD-10-CM | POA: Diagnosis not present

## 2023-07-29 MED ORDER — FAMOTIDINE 20 MG PO TABS
ORAL_TABLET | ORAL | 11 refills | Status: DC
Start: 2023-07-29 — End: 2023-09-19

## 2023-07-29 NOTE — Assessment & Plan Note (Signed)
Quit smoking 2015 with nl ex tol "walked 20 m per day " at wt  175  IgG4 ILD dx 2017 vats bx / neg rheum w/u >> rx azathioprine rx [facility] >? 2021 d/c (last eval actually 102019) -08/26/18: Normal spirometry with FEV1 3.42/89%, normal lung volumes with vital capacity 4.34/91%, normal DLCO at 30/105%  - 07/29/2023  @ 214 lb  Walked on RA  x  3  lap(s) =  approx 450  ft  @mod  to fast pace, stopped due to end of study  with lowest 02 sats 92%     At this point does not have any convincing evidence of IgG4 lung dz or any significant ILD but rather limited by wt gain/ deconditioning and severe overt gerd (cough to point of gag/vomit)   Rec:  For cough max mucinex dm and max rx for GERD   For breathing : paced sub max ex and : Make sure you check your oxygen saturation at your highest level of activity(NOT after you stop)  to be sure it stays over 90% and keep track of it at least once a week, more often if breathing getting worse, and let me know if losing ground. (Collect the dots to connect the dots approach)    F/u in 4-6 weeks with all meds in hand using a trust but verify approach to confirm accurate Medication  Reconciliation The principal here is that until we are certain that the  patients are doing what we've asked, it makes no sense to ask them to do more.   Discussed in detail all the  indications, usual  risks and alternatives  relative to the benefits with patient who agrees to proceed with conservative f/u as outlined    Each maintenance medication was reviewed in detail including emphasizing most importantly the difference between maintenance and prns and under what circumstances the prns are to be triggered using an action plan format where appropriate.  Total time for H and P, chart review, counseling,  directly observing portions of ambulatory 02 saturation study/ and generating customized AVS unique to this office visit / same day charting = 60 min complex new pt eval.

## 2023-07-29 NOTE — Patient Instructions (Addendum)
Dexilant  60 mg   Take  30-60 min before first meal of the day and Pepcid (famotidine)  20 mg after supper until return to office - this is the best way to tell whether stomach acid is contributing to your problem.    GERD (REFLUX)  is an extremely common cause of respiratory symptoms just like yours , many times with no obvious heartburn at all.    It can be treated with medication, but also with lifestyle changes including elevation of the head of your bed (ideally with 6 -8 inch blocks under the headboard of your bed),  Smoking cessation, avoidance of late meals, excessive alcohol, and avoid fatty foods, chocolate, peppermint, colas, red wine, and acidic juices such as orange juice.  NO MINT OR MENTHOL PRODUCTS SO NO COUGH DROPS - LUDEN's ok  USE SUGARLESS CANDY INSTEAD (Jolley ranchers or Stover's or Life Savers) or even ice chips will also do - the key is to swallow to prevent all throat clearing. NO OIL BASED VITAMINS - use powdered substitutes.  Avoid fish oil when coughing.    For cough/ congestion >   mucinex dm  up to maximum of  1200 mg every 12 hours as needed   Please remember to go to the  x-ray department  @  [facility] for your tests - we will call you with the results when they are available     Please schedule a follow up office visit in 4 weeks, call sooner if needed with all medications /inhalers/ solutions in hand so we can verify exactly what you are taking. This includes all medications from all doctors and over the counters - PLEASE separate them into two bags:  the ones you take automatically, no matter what, vs the ones you take just when you feel you need them "BAG #2 is UP TO YOU"  - this will really help [place] help you take your medications more effectively.

## 2023-08-04 ENCOUNTER — Ambulatory Visit (HOSPITAL_COMMUNITY): Payer: 59 | Attending: Family Medicine

## 2023-08-09 DIAGNOSIS — R059 Cough, unspecified: Secondary | ICD-10-CM | POA: Diagnosis not present

## 2023-08-09 DIAGNOSIS — R0981 Nasal congestion: Secondary | ICD-10-CM | POA: Diagnosis not present

## 2023-08-18 ENCOUNTER — Other Ambulatory Visit: Payer: Self-pay

## 2023-08-18 ENCOUNTER — Ambulatory Visit (HOSPITAL_COMMUNITY): Payer: 59 | Attending: Family Medicine

## 2023-08-18 DIAGNOSIS — M545 Low back pain, unspecified: Secondary | ICD-10-CM | POA: Diagnosis not present

## 2023-08-18 DIAGNOSIS — S72401A Unspecified fracture of lower end of right femur, initial encounter for closed fracture: Secondary | ICD-10-CM | POA: Insufficient documentation

## 2023-08-18 DIAGNOSIS — R2689 Other abnormalities of gait and mobility: Secondary | ICD-10-CM | POA: Diagnosis not present

## 2023-08-18 DIAGNOSIS — R262 Difficulty in walking, not elsewhere classified: Secondary | ICD-10-CM | POA: Diagnosis not present

## 2023-08-18 DIAGNOSIS — M25561 Pain in right knee: Secondary | ICD-10-CM | POA: Diagnosis not present

## 2023-08-18 DIAGNOSIS — G8929 Other chronic pain: Secondary | ICD-10-CM | POA: Insufficient documentation
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
## 2023-08-22 ENCOUNTER — Ambulatory Visit (HOSPITAL_COMMUNITY): Payer: 59

## 2023-08-22 DIAGNOSIS — R2689 Other abnormalities of gait and mobility: Secondary | ICD-10-CM

## 2023-08-22 DIAGNOSIS — M25561 Pain in right knee: Secondary | ICD-10-CM | POA: Diagnosis not present

## 2023-08-22 DIAGNOSIS — M545 Low back pain, unspecified: Secondary | ICD-10-CM

## 2023-08-22 DIAGNOSIS — S72401A Unspecified fracture of lower end of right femur, initial encounter for closed fracture: Secondary | ICD-10-CM | POA: Diagnosis not present

## 2023-08-22 DIAGNOSIS — G8929 Other chronic pain: Secondary | ICD-10-CM | POA: Diagnosis not present

## 2023-08-22 DIAGNOSIS — R262 Difficulty in walking, not elsewhere classified: Secondary | ICD-10-CM | POA: Diagnosis not present
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
## 2023-08-25 ENCOUNTER — Ambulatory Visit (HOSPITAL_COMMUNITY): Payer: 59 | Admitting: Physical Therapy

## 2023-08-25 DIAGNOSIS — G8929 Other chronic pain: Secondary | ICD-10-CM | POA: Diagnosis not present

## 2023-08-25 DIAGNOSIS — R2689 Other abnormalities of gait and mobility: Secondary | ICD-10-CM

## 2023-08-25 DIAGNOSIS — R262 Difficulty in walking, not elsewhere classified: Secondary | ICD-10-CM

## 2023-08-25 DIAGNOSIS — M25561 Pain in right knee: Secondary | ICD-10-CM

## 2023-08-25 DIAGNOSIS — S72401A Unspecified fracture of lower end of right femur, initial encounter for closed fracture: Secondary | ICD-10-CM | POA: Diagnosis not present

## 2023-08-25 DIAGNOSIS — M545 Low back pain, unspecified: Secondary | ICD-10-CM | POA: Diagnosis not present
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
## 2023-08-26 ENCOUNTER — Ambulatory Visit: Payer: 59 | Admitting: Internal Medicine

## 2023-08-30 ENCOUNTER — Ambulatory Visit (HOSPITAL_COMMUNITY): Payer: 59 | Admitting: Physical Therapy

## 2023-08-30 DIAGNOSIS — R2689 Other abnormalities of gait and mobility: Secondary | ICD-10-CM

## 2023-08-30 DIAGNOSIS — R262 Difficulty in walking, not elsewhere classified: Secondary | ICD-10-CM

## 2023-08-30 DIAGNOSIS — M25561 Pain in right knee: Secondary | ICD-10-CM

## 2023-08-30 DIAGNOSIS — G8929 Other chronic pain: Secondary | ICD-10-CM | POA: Diagnosis not present

## 2023-08-30 DIAGNOSIS — S72401A Unspecified fracture of lower end of right femur, initial encounter for closed fracture: Secondary | ICD-10-CM | POA: Diagnosis not present

## 2023-08-30 DIAGNOSIS — M545 Low back pain, unspecified: Secondary | ICD-10-CM | POA: Diagnosis not present
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
## 2023-09-01 ENCOUNTER — Ambulatory Visit (HOSPITAL_COMMUNITY): Payer: 59

## 2023-09-01 DIAGNOSIS — M545 Low back pain, unspecified: Secondary | ICD-10-CM | POA: Diagnosis not present

## 2023-09-01 DIAGNOSIS — R262 Difficulty in walking, not elsewhere classified: Secondary | ICD-10-CM | POA: Diagnosis not present

## 2023-09-01 DIAGNOSIS — R2689 Other abnormalities of gait and mobility: Secondary | ICD-10-CM | POA: Diagnosis not present

## 2023-09-01 DIAGNOSIS — M25561 Pain in right knee: Secondary | ICD-10-CM | POA: Diagnosis not present

## 2023-09-01 DIAGNOSIS — S72401A Unspecified fracture of lower end of right femur, initial encounter for closed fracture: Secondary | ICD-10-CM | POA: Diagnosis not present

## 2023-09-01 DIAGNOSIS — G8929 Other chronic pain: Secondary | ICD-10-CM | POA: Diagnosis not present
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
## 2023-09-06 ENCOUNTER — Ambulatory Visit (HOSPITAL_COMMUNITY): Payer: 59

## 2023-09-06 DIAGNOSIS — S72401A Unspecified fracture of lower end of right femur, initial encounter for closed fracture: Secondary | ICD-10-CM | POA: Diagnosis not present

## 2023-09-06 DIAGNOSIS — R2689 Other abnormalities of gait and mobility: Secondary | ICD-10-CM

## 2023-09-06 DIAGNOSIS — G8929 Other chronic pain: Secondary | ICD-10-CM

## 2023-09-06 DIAGNOSIS — M545 Low back pain, unspecified: Secondary | ICD-10-CM | POA: Diagnosis not present

## 2023-09-06 DIAGNOSIS — M25561 Pain in right knee: Secondary | ICD-10-CM | POA: Diagnosis not present

## 2023-09-06 DIAGNOSIS — R262 Difficulty in walking, not elsewhere classified: Secondary | ICD-10-CM
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
## 2023-09-08 ENCOUNTER — Ambulatory Visit (HOSPITAL_COMMUNITY): Payer: 59

## 2023-09-08 DIAGNOSIS — M545 Low back pain, unspecified: Secondary | ICD-10-CM | POA: Diagnosis not present

## 2023-09-08 DIAGNOSIS — M25561 Pain in right knee: Secondary | ICD-10-CM | POA: Diagnosis not present

## 2023-09-08 DIAGNOSIS — G8929 Other chronic pain: Secondary | ICD-10-CM

## 2023-09-08 DIAGNOSIS — R262 Difficulty in walking, not elsewhere classified: Secondary | ICD-10-CM | POA: Diagnosis not present

## 2023-09-08 DIAGNOSIS — R2689 Other abnormalities of gait and mobility: Secondary | ICD-10-CM | POA: Diagnosis not present

## 2023-09-08 DIAGNOSIS — S72401A Unspecified fracture of lower end of right femur, initial encounter for closed fracture: Secondary | ICD-10-CM | POA: Diagnosis not present
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
## 2023-09-13 ENCOUNTER — Ambulatory Visit (HOSPITAL_COMMUNITY): Payer: 59 | Attending: Family Medicine

## 2023-09-13 ENCOUNTER — Telehealth: Payer: Self-pay | Admitting: Orthopedic Surgery

## 2023-09-13 DIAGNOSIS — K219 Gastro-esophageal reflux disease without esophagitis: Secondary | ICD-10-CM | POA: Diagnosis not present

## 2023-09-13 DIAGNOSIS — R2689 Other abnormalities of gait and mobility: Secondary | ICD-10-CM | POA: Diagnosis not present

## 2023-09-13 DIAGNOSIS — R7303 Prediabetes: Secondary | ICD-10-CM | POA: Diagnosis not present

## 2023-09-13 DIAGNOSIS — M62838 Other muscle spasm: Secondary | ICD-10-CM | POA: Diagnosis not present

## 2023-09-13 DIAGNOSIS — G8929 Other chronic pain: Secondary | ICD-10-CM | POA: Diagnosis not present

## 2023-09-13 DIAGNOSIS — F319 Bipolar disorder, unspecified: Secondary | ICD-10-CM | POA: Diagnosis not present

## 2023-09-13 DIAGNOSIS — Z791 Long term (current) use of non-steroidal anti-inflammatories (NSAID): Secondary | ICD-10-CM | POA: Diagnosis not present

## 2023-09-13 DIAGNOSIS — M545 Low back pain, unspecified: Secondary | ICD-10-CM | POA: Insufficient documentation

## 2023-09-13 DIAGNOSIS — R262 Difficulty in walking, not elsewhere classified: Secondary | ICD-10-CM | POA: Diagnosis not present

## 2023-09-13 DIAGNOSIS — Z833 Family history of diabetes mellitus: Secondary | ICD-10-CM | POA: Diagnosis not present

## 2023-09-13 DIAGNOSIS — E785 Hyperlipidemia, unspecified: Secondary | ICD-10-CM | POA: Diagnosis not present

## 2023-09-13 DIAGNOSIS — M1990 Unspecified osteoarthritis, unspecified site: Secondary | ICD-10-CM | POA: Diagnosis not present

## 2023-09-13 DIAGNOSIS — M25561 Pain in right knee: Secondary | ICD-10-CM | POA: Insufficient documentation

## 2023-09-13 DIAGNOSIS — F4310 Post-traumatic stress disorder, unspecified: Secondary | ICD-10-CM | POA: Diagnosis not present

## 2023-09-13 DIAGNOSIS — G629 Polyneuropathy, unspecified: Secondary | ICD-10-CM | POA: Diagnosis not present

## 2023-09-13 DIAGNOSIS — Z5986 Financial insecurity: Secondary | ICD-10-CM | POA: Diagnosis not present

## 2023-09-13 NOTE — Telephone Encounter (Signed)
Dr. Mort Sawyers pt - spoke w/the pt, he is wanting to come here and see Dr. Romeo Apple for a shoe lift, he stated one side is 1 1/4 inches shorter than the other.  Is that something he would come here for?  [phone]
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
## 2023-09-14 NOTE — Telephone Encounter (Signed)
Dr Romeo Apple has told him, the patient should go back to [facility] to see Dr. Jan Fireman in postop.   I called him to advise and gave him the phone number he has not been back there since surgery

## 2023-09-15 ENCOUNTER — Telehealth (HOSPITAL_COMMUNITY): Payer: Self-pay

## 2023-09-15 ENCOUNTER — Encounter (HOSPITAL_COMMUNITY): Payer: 59

## 2023-09-15 NOTE — Telephone Encounter (Signed)
Called patient today for his first no show. Patient did not answer and left a voice message. Informed patient about the facility's policies on cancellation/no shows and that he can call if he has questions. Also reminded patient of his future appointments.  [name]. Kamrin Spath, PT, DPT, OCS Board-Certified Clinical Specialist in Orthopedic PT PT Compact Privilege # ([place]): [phone] T
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
## 2023-09-19 ENCOUNTER — Encounter: Payer: Self-pay | Admitting: Internal Medicine

## 2023-09-19 ENCOUNTER — Ambulatory Visit (INDEPENDENT_AMBULATORY_CARE_PROVIDER_SITE_OTHER): Payer: 59 | Admitting: Internal Medicine

## 2023-09-19 VITALS — BP 133/94 | HR 89 | Ht 71.0 in | Wt 215.0 lb

## 2023-09-19 DIAGNOSIS — Z87891 Personal history of nicotine dependence: Secondary | ICD-10-CM | POA: Insufficient documentation

## 2023-09-19 DIAGNOSIS — R0609 Other forms of dyspnea: Secondary | ICD-10-CM

## 2023-09-19 NOTE — Patient Instructions (Addendum)
Make sure you check your oxygen saturation at your highest level of activity  (NOT after you stop)  to be sure it stays over 90% and keep track of it at least once a week, more often if breathing getting worse, and let me know if losing ground. (Collect the dots to connect the dots approach)     Annual Chest ct is due in June 2025  - defer to Dr Scharlene Gloss since no f/u here needed on a regular basis.  Pulmonary follow up as needed

## 2023-09-19 NOTE — Assessment & Plan Note (Signed)
Quit smoking 2015 so eligible until 2030 for annual LDSCT > deferred to Dr Scharlene Gloss office   >>> pulmonary f/u in [facility] clinic is prn

## 2023-09-19 NOTE — Assessment & Plan Note (Signed)
Quit smoking 2015 with nl ex tol "walked 20 m per day " at wt  175 -  IgG4 ILD dx 2017 vats bx / neg rheum w/u >> rx azathioprine rx [facility] >? 2021 d/c (last eval actually 08/2018) -08/26/18: Normal spirometry with FEV1 3.42/89%, normal lung volumes with vital capacity 4.34/91%, normal DLCO at 30/105%  - 07/29/2023  @ 214 lb  Walked on RA  x  3  lap(s) =  approx 450  ft  @mod  to fast pace, stopped due to end of study  with lowest 02 sats 92%   - 09/19/2023   Walked on ra  x  3  lap(s) =  approx 450  ft  @ quick steady  pace, stopped due to and of study  with lowest 02 sats 95% s sob    No evidence of recurrent ILD now ? 3 y off rx > f/u can be prn in [place] / ILD clinic   No need for f/u here   Each maintenance medication was reviewed in detail including emphasizing most importantly the difference between maintenance and prns and under what circumstances the prns are to be triggered using an action plan format where appropriate.  Total time for H and P, chart review, counseling,  directly observing portions of ambulatory 02 saturation study/ and generating customized AVS unique to this office visit / same day charting  >30 min summary final f/u ov

## 2023-09-20 ENCOUNTER — Ambulatory Visit (HOSPITAL_COMMUNITY): Payer: 59

## 2023-09-20 DIAGNOSIS — M545 Low back pain, unspecified: Secondary | ICD-10-CM | POA: Diagnosis not present

## 2023-09-20 DIAGNOSIS — R262 Difficulty in walking, not elsewhere classified: Secondary | ICD-10-CM

## 2023-09-20 DIAGNOSIS — R2689 Other abnormalities of gait and mobility: Secondary | ICD-10-CM

## 2023-09-20 DIAGNOSIS — M25561 Pain in right knee: Secondary | ICD-10-CM | POA: Diagnosis not present

## 2023-09-20 DIAGNOSIS — G8929 Other chronic pain: Secondary | ICD-10-CM | POA: Diagnosis not present
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
## 2023-09-22 ENCOUNTER — Ambulatory Visit (HOSPITAL_COMMUNITY): Payer: 59

## 2023-09-22 DIAGNOSIS — R2689 Other abnormalities of gait and mobility: Secondary | ICD-10-CM | POA: Diagnosis not present

## 2023-09-22 DIAGNOSIS — G8929 Other chronic pain: Secondary | ICD-10-CM

## 2023-09-22 DIAGNOSIS — R262 Difficulty in walking, not elsewhere classified: Secondary | ICD-10-CM | POA: Diagnosis not present

## 2023-09-22 DIAGNOSIS — M545 Low back pain, unspecified: Secondary | ICD-10-CM | POA: Diagnosis not present

## 2023-09-22 DIAGNOSIS — M25561 Pain in right knee: Secondary | ICD-10-CM
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
## 2023-09-23 DIAGNOSIS — M2170 Unequal limb length (acquired), unspecified site: Secondary | ICD-10-CM | POA: Insufficient documentation

## 2023-09-23 DIAGNOSIS — R269 Unspecified abnormalities of gait and mobility: Secondary | ICD-10-CM | POA: Insufficient documentation

## 2023-09-27 ENCOUNTER — Encounter (HOSPITAL_COMMUNITY): Payer: 59

## 2023-09-27 ENCOUNTER — Telehealth (HOSPITAL_COMMUNITY): Payer: Self-pay

## 2023-09-27 NOTE — Telephone Encounter (Signed)
Called patient today for his 2nd no show. Patient did not answer and left a voice message. Informed patient about the facility's policies on cancellation/no shows and that he can call if she has questions. Also reminded patient of his future appointment.  [name]. [name], PT, DPT, OCS Board-Certified Clinical Specialist in Orthopedic PT PT Compact Privilege # ([place]): [phone]

## 2023-09-29 ENCOUNTER — Ambulatory Visit (HOSPITAL_COMMUNITY): Payer: 59

## 2023-09-29 DIAGNOSIS — R262 Difficulty in walking, not elsewhere classified: Secondary | ICD-10-CM

## 2023-09-29 DIAGNOSIS — M545 Low back pain, unspecified: Secondary | ICD-10-CM | POA: Diagnosis not present

## 2023-09-29 DIAGNOSIS — G8929 Other chronic pain: Secondary | ICD-10-CM

## 2023-09-29 DIAGNOSIS — R2689 Other abnormalities of gait and mobility: Secondary | ICD-10-CM | POA: Diagnosis not present

## 2023-09-29 DIAGNOSIS — M25561 Pain in right knee: Secondary | ICD-10-CM
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
## 2023-10-04 ENCOUNTER — Ambulatory Visit (HOSPITAL_COMMUNITY): Payer: 59 | Admitting: Physical Therapy

## 2023-10-04 DIAGNOSIS — R262 Difficulty in walking, not elsewhere classified: Secondary | ICD-10-CM | POA: Diagnosis not present

## 2023-10-04 DIAGNOSIS — R2689 Other abnormalities of gait and mobility: Secondary | ICD-10-CM | POA: Diagnosis not present

## 2023-10-04 DIAGNOSIS — M25561 Pain in right knee: Secondary | ICD-10-CM

## 2023-10-04 DIAGNOSIS — M545 Low back pain, unspecified: Secondary | ICD-10-CM | POA: Diagnosis not present

## 2023-10-04 DIAGNOSIS — G8929 Other chronic pain: Secondary | ICD-10-CM | POA: Diagnosis not present
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
## 2023-10-10 ENCOUNTER — Encounter (HOSPITAL_COMMUNITY): Payer: 59

## 2023-10-12 ENCOUNTER — Encounter (HOSPITAL_COMMUNITY): Payer: 59

## 2023-11-16 NOTE — Progress Notes (Signed)
   BP (!) 160/100   Pulse 78   Ht 5' 10 (1.778 m)   Wt 218 lb (98.9 kg)   BMI 31.28 kg/m   Body mass index is 31.28 kg/m.  Chief Complaint  Patient presents with   Leg Problem    Had ORIF right femur at [facility] 2023 has not returned there since surgery has a lift on shoe ordered by primary care.     No diagnosis found.  DOI/DOS/ Date: previous Femur surgery on Right Dr Claris at [facility]. 09/09/22 patient has not followed up there at [facility] since the surgery.  Appointment was made but patient cancelled.    Unchanged

## 2023-11-17 ENCOUNTER — Other Ambulatory Visit (INDEPENDENT_AMBULATORY_CARE_PROVIDER_SITE_OTHER): Payer: Self-pay

## 2023-11-17 ENCOUNTER — Ambulatory Visit (INDEPENDENT_AMBULATORY_CARE_PROVIDER_SITE_OTHER): Payer: 59 | Admitting: Orthopedic Surgery

## 2023-11-17 ENCOUNTER — Encounter: Payer: Self-pay | Admitting: Orthopedic Surgery

## 2023-11-17 VITALS — BP 160/100 | HR 78 | Ht 70.0 in | Wt 218.0 lb

## 2023-11-17 DIAGNOSIS — S72401A Unspecified fracture of lower end of right femur, initial encounter for closed fracture: Secondary | ICD-10-CM | POA: Diagnosis not present

## 2023-11-17 DIAGNOSIS — M25551 Pain in right hip: Secondary | ICD-10-CM | POA: Diagnosis not present

## 2023-11-17 MED ORDER — METHOCARBAMOL 750 MG PO TABS: 750.0000 mg | ORAL_TABLET | Freq: Four times a day (QID) | ORAL | 2 refills | Status: DC

## 2023-11-17 MED ORDER — IBUPROFEN 800 MG PO TABS
800.0000 mg | ORAL_TABLET | Freq: Three times a day (TID) | ORAL | 0 refills | Status: AC | PRN
Start: 2023-11-17 — End: ?

## 2023-11-17 NOTE — Patient Instructions (Signed)
 To schedule at [facility] (if meds dont help) call them they have you in system already Ithe number is [phone]. Let us know if you go so we can give you xray on disc

## 2023-11-17 NOTE — Progress Notes (Signed)
 NEW PROBLEM//OFFICE VISIT   Patient: Shawn [name]           Date of Birth: 1971-10-02           MRN: [id number] Visit Date: 11/17/2023 Requested by: Shona Norleen PEDLAR, MD [address] [place],  [place] [address] PCP: Shona Norleen PEDLAR, MD   Chief Complaint  Patient presents with   Leg Problem    Had ORIF right femur at [facility] 2023 has not returned there since surgery has a lift on shoe ordered by primary care.     53 year old male had a severe fracture to his right femur treated at [facility] with a long nail and multiple distal screws near the knee  He complains of pain in his right hip from right hip to the femur approximately midshaft after he twisted his hip about 2 weeks ago.  He says after he gets up and gets going he is fine but when he sits down he has a lot of discomfort     ROS denies back pain or neurologic symptoms   No Known Allergies  Current Outpatient Medications  Medication Instructions   clonazePAM (KLONOPIN) 0.5 MG tablet May take 2 in morning and 1 in the evening as needed for acute anxiety.   cyclobenzaprine  (FLEXERIL ) 10 mg, At bedtime PRN   famotidine  (PEPCID ) 20 MG tablet SMARTSIG:1 Tablet(s) By Mouth Every Evening   gabapentin  (NEURONTIN ) 300 mg, 3 times daily   ibuprofen  (ADVIL ) 800 mg, Oral, Every 8 hours PRN   methocarbamol  (ROBAXIN ) 750 mg, Oral, 4 times daily   Multiple Vitamin (MULTIVITAMIN) tablet 1 tablet, Daily   OLANZapine  (ZYPREXA ) 10 MG tablet Take by mouth.   VRAYLAR 3 MG capsule 1 capsule, Daily      BP (!) 160/100   Pulse 78   Ht 5' 10 (1.778 m)   Wt 218 lb (98.9 kg)   BMI 31.28 kg/m   Body mass index is 31.28 kg/m.  General appearance: Well-developed well-nourished no gross deformities  Cardiovascular normal pulse and perfusion normal color without edema  Neurologically no sensation loss or deficits or pathologic reflexes  Psychological: Awake alert and oriented x3 mood and affect normal  Skin no lacerations or  ulcerations no nodularity no palpable masses, no erythema or nodularity  Musculoskeletal:  Right leg is shorter than left approximately three-quarter inch shoe lift is in his right shoe  He has tenderness on the anterolateral side of his right hip with no real joint pain with range of motion  Lumbar spine was nontender  Imaging studies were obtained  DG FEMUR, MIN 2 VIEWS RIGHT Result Date: 11/17/2023 Imaging right femur multiple views Patient complains of hip pain X-ray shows 1 broken screw approximately Fracture line still visible at the distal femur although patient asymptomatic Questionable fibrous union distally Hardware Fracture approximately may be dynamization affect with the nailing No evidence of hip arthritis      Past Medical History:  Diagnosis Date   Anemia    Arthritis    Back pain    Bipolar 1 disorder (HCC)    Bipolar disorder (HCC)    Blood clots in brain    2005--  due to head injury with steel plate placed   COPD (chronic obstructive pulmonary disease) (HCC)    Diverticulitis    Erosive esophagitis    Family history of adverse reaction to anesthesia    he was adopted   GERD (gastroesophageal reflux disease)    Heart disease  irregular heart beat   Heart murmur    Hiatal hernia    HOH (hard of hearing)    biological parents are both deaf.   left ear is good.   Hyperlipidemia    Hypertension    IgG4 deficiency (HCC)    Lung nodules    Schizoaffective disorder, bipolar type (HCC)    Vomiting     Past Surgical History:  Procedure Laterality Date   BRAIN SURGERY  2005   after head injury, patient states had 2 large blood clots evacuated   COLONOSCOPY WITH ESOPHAGOGASTRODUODENOSCOPY (EGD) N/A 12/05/2013   MFM:fpoi erosive relfux/HH/melanosis coli/colonic diverticulosis. tubulovillous adenoma removed. next tcs 11/2016   COLONOSCOPY WITH PROPOFOL  N/A 01/19/2017   Dr. Shaaron: Diverticulosis, single 8 mm sessile serrated adenoma without dysplasia  removed from the ascending colon, internal.  Next colonoscopy recommended in March 2023.   ESOPHAGOGASTRODUODENOSCOPY (EGD) WITH PROPOFOL  N/A 02/05/2019   Dr. Shaaron: Small hiatal hernia, mild erosive reflux esophagitis.  Esophagus dilated due to history of dysphagia.   FLEXIBLE BRONCHOSCOPY N/A 01/23/2016   Procedure: FLEXIBLE BRONCHOSCOPY;  Surgeon: Dorise MARLA Fellers, MD;  [facility]: [facility] OR;  Service: Thoracic;  Laterality: N/A;   KNEE SURGERY Right 09/01/2022   Patient states he has pins in his knee and a rod that goes up to his hip.   LUNG BIOPSY N/A 01/23/2016   Procedure: LEFT LUNG BIOPSY;  Surgeon: Dorise MARLA Fellers, MD;  Location: [facility] OR;  Service: Thoracic;  Laterality: N/A;   MALONEY DILATION N/A 02/05/2019   Procedure: AGAPITO DILATION;  Surgeon: Shaaron Lamar HERO, MD;  [facility];  Service: Endoscopy;  Laterality: N/A;   POLYPECTOMY  01/19/2017   Procedure: POLYPECTOMY;  Surgeon: Lamar HERO Shaaron, MD;  [facility];  Service: Endoscopy;;  ascending colon polyp   VIDEO ASSISTED THORACOSCOPY Left 01/23/2016   Procedure: LEFT VIDEO ASSISTED THORACOSCOPY;  Surgeon: Dorise MARLA Fellers, MD;  Location: [facility] OR;  Service: Thoracic;  Laterality: Left;    Family History  Adopted: Yes  Problem Relation Age of Onset   Alzheimer's disease Other    Parkinson's disease Other    Mental illness Other    Colon cancer Neg Hx        adopted at 37 months old, unsure about GI history of parents    Social History   Tobacco Use   Smoking status: Former    Current packs/day: 0.00    Average packs/day: 0.8 packs/day for 25.0 years (18.8 ttl pk-yrs)    Types: Cigars, Cigarettes    Start date: 07/09/1989    Quit date: 07/09/2014    Years since quitting: 9.3    Passive exposure: Never   Smokeless tobacco: Current    Types: Snuff   Tobacco comments:    pt uses dip tobacco  Vaping Use   Vaping status: Never Used  Substance Use Topics   Alcohol use: Yes    Alcohol/week: 0.0 standard drinks  of alcohol    Comment: occ   Drug use: Never    No Known Allergies  Current Meds  Medication Sig   clonazePAM (KLONOPIN) 0.5 MG tablet May take 2 in morning and 1 in the evening as needed for acute anxiety.   cyclobenzaprine  (FLEXERIL ) 10 MG tablet Take 10 mg by mouth at bedtime as needed.   famotidine  (PEPCID ) 20 MG tablet SMARTSIG:1 Tablet(s) By Mouth Every Evening   gabapentin  (NEURONTIN ) 300 MG capsule Take 300 mg by mouth 3 (three) times daily.  ibuprofen  (ADVIL ) 800 MG tablet Take 1 tablet (800 mg total) by mouth every 8 (eight) hours as needed.   methocarbamol  (ROBAXIN ) 750 MG tablet Take 1 tablet (750 mg total) by mouth 4 (four) times daily.   Multiple Vitamin (MULTIVITAMIN) tablet Take 1 tablet by mouth daily.   OLANZapine  (ZYPREXA ) 10 MG tablet Take by mouth.   VRAYLAR 3 MG capsule Take 1 capsule by mouth daily.   [DISCONTINUED] meloxicam (MOBIC) 15 MG tablet Take 15 mg by mouth daily as needed.     MEDICAL DECISION MAKING   IMAGING: DG FEMUR, MIN 2 VIEWS RIGHT Result Date: 11/17/2023 Imaging right femur multiple views Patient complains of hip pain X-ray shows 1 broken screw approximately Fracture line still visible at the distal femur although patient asymptomatic Questionable fibrous union distally Hardware Fracture approximately may be dynamization affect with the nailing No evidence of hip arthritis    C. 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    Dispense:  30 tablet    Refill:  0   methocarbamol  (ROBAXIN ) 750 MG tablet    Sig: Take 1 tablet (750 mg total) by mouth 4 (four) times daily.    Dispense:  60 tablet    Refill:  2    Taft Minerva, MD  11/17/2023 3:01 PM

## 2023-11-25 ENCOUNTER — Telehealth: Payer: Self-pay | Admitting: Orthopedic Surgery

## 2023-11-25 NOTE — Telephone Encounter (Signed)
Spoke w/Janell at [facility] and advised, she verbalized understanding

## 2023-11-25 NOTE — Telephone Encounter (Signed)
Dr. Mort Sawyers pt - Shawn [name], pharmacist w/[facility] [phone] lvm stating the patient is trying to get a refill on Flexeril that his PCP prescribed, but Dr. Romeo Apple prescribed Robaxin.  Ok for him to continue or did Dr. Rexene Edison tell the pt to discontinue the Flexeril.

## 2023-12-20 ENCOUNTER — Other Ambulatory Visit: Payer: Self-pay | Admitting: Orthopedic Surgery

## 2023-12-20 DIAGNOSIS — M25551 Pain in right hip: Secondary | ICD-10-CM

## 2023-12-20 DIAGNOSIS — S72401A Unspecified fracture of lower end of right femur, initial encounter for closed fracture: Secondary | ICD-10-CM

## 2023-12-20 MED ORDER — METHOCARBAMOL 750 MG PO TABS: 750.0000 mg | ORAL_TABLET | Freq: Four times a day (QID) | ORAL | 2 refills | Status: DC

## 2023-12-20 NOTE — Telephone Encounter (Signed)
Dr. Mort Sawyers pt - spoke w/the pt, he is requesting a refill on Methocarbamol 750mg , 60 tablets, 4 times daily to be sent to [facility] in [place].

## 2024-01-16 DIAGNOSIS — E782 Mixed hyperlipidemia: Secondary | ICD-10-CM | POA: Diagnosis not present

## 2024-01-16 DIAGNOSIS — Z125 Encounter for screening for malignant neoplasm of prostate: Secondary | ICD-10-CM | POA: Diagnosis not present

## 2024-01-16 DIAGNOSIS — R7303 Prediabetes: Secondary | ICD-10-CM | POA: Diagnosis not present

## 2024-02-07 DIAGNOSIS — E669 Obesity, unspecified: Secondary | ICD-10-CM | POA: Insufficient documentation

## 2024-02-09 ENCOUNTER — Encounter: Payer: Self-pay | Admitting: *Deleted

## 2024-02-23 ENCOUNTER — Telehealth: Payer: Self-pay | Admitting: *Deleted

## 2024-02-23 NOTE — Telephone Encounter (Signed)
  Procedure: COLONOSCOPY  Height: 5'10 Weight: 224LBS      Have you had a colonoscopy before?  2018, Dr. Riley Cheadle  Do you have family history of colon cancer?  no  Do you have a family history of polyps? no  Previous colonoscopy with polyps removed? yes  Do you have a history colorectal cancer?   no  Are you diabetic?  no  Do you have a prosthetic or mechanical heart valve? no  Do you have a pacemaker/defibrillator?   no  Have you had endocarditis/atrial fibrillation?  no  Do you use supplemental oxygen/CPAP?  no  Have you had joint replacement within the last 12 months?  no  Do you tend to be constipated or have to use laxatives?  no   Do you have history of alcohol use? If yes, how much and how often.  no  Do you have history or are you using drugs? If yes, what do are you  using?  no  Have you ever had a stroke/heart attack?  no  Have you ever had a heart or other vascular stent placed,?no  Do you take weight loss medication? no  Do you take any blood-thinning medications such as: (Plavix, aspirin, Coumadin, Aggrenox, Brilinta, Xarelto, Eliquis, Pradaxa, Savaysa or Effient)? no  If yes we need the name, milligram, dosage and who is prescribing doctor:               Current Outpatient Medications  Medication Sig Dispense Refill   Cyanocobalamin (B-12 PO) Take by mouth daily. 2500MCG     cyclobenzaprine (FLEXERIL) 10 MG tablet Take 10 mg by mouth daily.     dexlansoprazole (DEXILANT) 60 MG capsule Take 60 mg by mouth daily.     MELATONIN PO Take by mouth at bedtime.     meloxicam (MOBIC) 15 MG tablet Take 15 mg by mouth daily.     clonazePAM (KLONOPIN) 0.5 MG tablet Take 0.5 mg by mouth 2 (two) times daily.     famotidine (PEPCID) 20 MG tablet SMARTSIG:1 Tablet(s) By Mouth Every Evening     gabapentin (NEURONTIN) 300 MG capsule Take 300 mg by mouth 3 (three) times daily.     ibuprofen (ADVIL) 800 MG tablet Take 1 tablet (800 mg total) by mouth every 8 (eight) hours  as needed. (Patient taking differently: Take 800 mg by mouth 3 (three) times daily.) 30 tablet 0   methocarbamol (ROBAXIN) 750 MG tablet Take 1 tablet (750 mg total) by mouth 4 (four) times daily. (Patient taking differently: Take 750 mg by mouth daily.) 60 tablet 2   Multiple Vitamin (MULTIVITAMIN) tablet Take 1 tablet by mouth daily.     VRAYLAR 3 MG capsule Take 1 capsule by mouth daily.     No current facility-administered medications for this visit.    No Known Allergies

## 2024-03-14 NOTE — Telephone Encounter (Signed)
 ASA 3 - okay to schedule.

## 2024-03-15 NOTE — Telephone Encounter (Signed)
 Called to schedule pt for colonoscopy and he says he is having issues with vomiting every morning. He asked if both procedures where going to be done. I advised pt that he will need to be seen in office before scheduling EGD. Pt was transferred to front office staff to make an appointment.

## 2024-03-15 NOTE — Telephone Encounter (Signed)
 Apt schd 03/19/24 at 3 w/ Gurney Lefort

## 2024-03-19 ENCOUNTER — Ambulatory Visit (INDEPENDENT_AMBULATORY_CARE_PROVIDER_SITE_OTHER): Admitting: Gastroenterology

## 2024-03-19 ENCOUNTER — Telehealth: Payer: Self-pay | Admitting: Gastroenterology

## 2024-03-19 ENCOUNTER — Encounter: Payer: Self-pay | Admitting: Gastroenterology

## 2024-03-19 VITALS — BP 122/76 | HR 80 | Temp 98.6°F | Ht 70.0 in | Wt 224.0 lb

## 2024-03-19 DIAGNOSIS — R1310 Dysphagia, unspecified: Secondary | ICD-10-CM | POA: Diagnosis not present

## 2024-03-19 DIAGNOSIS — R972 Elevated prostate specific antigen [PSA]: Secondary | ICD-10-CM | POA: Diagnosis not present

## 2024-03-19 DIAGNOSIS — R112 Nausea with vomiting, unspecified: Secondary | ICD-10-CM | POA: Diagnosis not present

## 2024-03-19 DIAGNOSIS — Z860101 Personal history of adenomatous and serrated colon polyps: Secondary | ICD-10-CM

## 2024-03-19 DIAGNOSIS — K219 Gastro-esophageal reflux disease without esophagitis: Secondary | ICD-10-CM

## 2024-03-19 NOTE — Patient Instructions (Signed)
Colonoscopy and upper endoscopy to be scheduled.

## 2024-03-19 NOTE — Telephone Encounter (Signed)
 Please let patient know that I noticed his PSA doubled in the last year and is now slightly elevated.   Is his PCP planning on rechecking within the next couple of months? If not, he needs to see urology for evaluation.
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
## 2024-03-20 ENCOUNTER — Other Ambulatory Visit: Payer: Self-pay | Admitting: *Deleted

## 2024-03-20 DIAGNOSIS — R972 Elevated prostate specific antigen [PSA]: Secondary | ICD-10-CM

## 2024-03-20 NOTE — Telephone Encounter (Signed)
 Pt states that he is not scheduled to see his PCP again until October. Pt is ok with being referred to Urology.

## 2024-03-20 NOTE — Telephone Encounter (Signed)
 Referral placed.

## 2024-03-21 ENCOUNTER — Telehealth: Payer: Self-pay | Admitting: *Deleted

## 2024-03-21 NOTE — Telephone Encounter (Signed)
 Pt insurance currently inactive. He stated he is going to find out what is going on and call us  back to schedule his TCS/EGD/ED with Dr. Riley Cheadle, ASA 3.

## 2024-03-26 ENCOUNTER — Encounter: Payer: Self-pay | Admitting: *Deleted

## 2024-03-26 ENCOUNTER — Other Ambulatory Visit: Payer: Self-pay | Admitting: *Deleted

## 2024-03-26 MED ORDER — PEG 3350-KCL-NA BICARB-NACL 420 G PO SOLR: 4000.0000 mL | Freq: Once | ORAL | 0 refills | Status: AC

## 2024-03-26 NOTE — Telephone Encounter (Signed)
 Pt states he has straight medicaid now. He has been scheduled for 05/09/24. Instructions mailed and prep sent to the pharmacy.

## 2024-04-30 NOTE — Progress Notes (Signed)
 05/01/2024 12:27 PM   Shawn [name] Jun 09, 1971 [id number]  Referring provider: Ezzard Sonny RAMAN, PA-C [address] [place],  [place] [address]  No chief complaint on file.   HPI: This 53 year old male was sent by Dr. Christyne Hurst for evaluation and management of the PSA.  Recent PSA from March of this year was 4.6.  Previous levels were 2.5 and 3.0.  He has been seen previously here in [place], then by [facility].  I saw him 3 times.  For PSA readings were done between November 2017 and September 2018--4.8, 3.5, 1.7 and 6.3.  He dropped out of follow-up and has not been seen since.  He does have lower urinary tract symptoms-IPSS 16/3.  He would rather not be on medical therapy.  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   left ear is good.   Hyperlipidemia    Hypertension    IgG4 deficiency (HCC)    Lung nodules    Schizoaffective disorder, bipolar type (HCC)    Vomiting     Surgical History: Past Surgical History:  Procedure Laterality Date   BRAIN SURGERY  2005   after head injury, patient states had 2 large blood clots evacuated   COLONOSCOPY WITH ESOPHAGOGASTRODUODENOSCOPY (EGD) N/A 12/05/2013   MFM:fpoi erosive relfux/HH/melanosis coli/colonic diverticulosis. tubulovillous adenoma removed. next tcs 11/2016   COLONOSCOPY WITH PROPOFOL  N/A 01/19/2017   Dr. Shaaron: Diverticulosis, single 8 mm sessile  serrated adenoma without dysplasia removed from the ascending colon, internal.  Next colonoscopy recommended in March 2023.   ESOPHAGOGASTRODUODENOSCOPY (EGD) WITH PROPOFOL  N/A 02/05/2019   Dr. Shaaron: Small hiatal hernia, mild erosive reflux esophagitis.  Esophagus dilated due to history of dysphagia.   FLEXIBLE BRONCHOSCOPY N/A 01/23/2016   Procedure: FLEXIBLE BRONCHOSCOPY;  Surgeon: Dorise MARLA Fellers, MD;  Location: [facility] OR;  Service: Thoracic;  Laterality: N/A;   KNEE SURGERY Right 09/01/2022   Patient states he has pins in his knee and a rod that goes up to his hip.   LUNG BIOPSY N/A 01/23/2016   Procedure: LEFT LUNG BIOPSY;  Surgeon: Dorise MARLA Fellers, MD;  Location: [facility] OR;  Service: Thoracic;  Laterality: N/A;   MALONEY DILATION N/A 02/05/2019   Procedure: AGAPITO DILATION;  Surgeon: Shaaron Lamar HERO, MD;  Location: [facility];  Service: Endoscopy;  Laterality: N/A;   POLYPECTOMY  01/19/2017   Procedure: POLYPECTOMY;  Surgeon: Lamar HERO Shaaron, MD;  Location: [facility];  Service: Endoscopy;;  ascending colon polyp   VIDEO ASSISTED THORACOSCOPY Left 01/23/2016   Procedure: LEFT VIDEO ASSISTED THORACOSCOPY;  Surgeon: Dorise MARLA Fellers, MD;  Location: [facility] OR;  Service: Thoracic;  Laterality: Left;    Home Medications:  Allergies as of 05/01/2024   No Known Allergies  Medication List        Accurate as of April 30, 2024 12:27 PM. If you have any questions, ask your nurse or doctor.          B-12 PO Take by mouth daily.   clonazePAM 0.5 MG tablet Commonly known as: KLONOPIN Take 0.5 mg by mouth 2 (two) times daily.   cyclobenzaprine  10 MG tablet Commonly known as: FLEXERIL  Take 10 mg by mouth daily.   Dexilant  60 MG capsule Generic drug: dexlansoprazole  Take 60 mg by mouth daily.   famotidine  20 MG tablet Commonly known as: PEPCID  SMARTSIG:1 Tablet(s) By Mouth Every Evening   gabapentin  300 MG capsule Commonly known as: NEURONTIN  Take 300 mg by mouth 3 (three)  times daily.   ibuprofen  800 MG tablet Commonly known as: ADVIL  Take 1 tablet (800 mg total) by mouth every 8 (eight) hours as needed. What changed: when to take this   MELATONIN PO Take by mouth at bedtime.   meloxicam 15 MG tablet Commonly known as: MOBIC Take 15 mg by mouth daily.   methocarbamol  750 MG tablet Commonly known as: ROBAXIN  Take 1 tablet (750 mg total) by mouth 4 (four) times daily. What changed: when to take this   multivitamin tablet Take 1 tablet by mouth daily.   omeprazole 20 MG capsule Commonly known as: PRILOSEC Take 20 mg by mouth daily.   Vraylar 3 MG capsule Generic drug: cariprazine Take 1 capsule by mouth daily.        Allergies: No Known Allergies  Family History: Family History  Adopted: Yes  Problem Relation Age of Onset   Alzheimer's disease Other    Parkinson's disease Other    Mental illness Other    Colon cancer Neg Hx        adopted at 25 months old, unsure about GI history of parents     Social History:  reports that he quit smoking about 9 years ago. His smoking use included cigars and cigarettes. He started smoking about 34 years ago. He has a 18.8 pack-year smoking history. He has never been exposed to tobacco smoke. His smokeless tobacco use includes snuff. He reports current alcohol use. He reports that he does not use drugs.  ROS: All other review of systems were reviewed and are negative except what is noted above in HPI  Physical Exam: There were no vitals taken for this visit.  Constitutional:  Alert and oriented, No acute distress. HEENT: [place] AT, moist mucus membranes.  Trachea midline, no masses. Cardiovascular: No clubbing, cyanosis, or edema. Respiratory: Normal respiratory effort, no increased work of breathing. GI: No inguinal hernias GU: Normal phallus. No masses/lesions on penis, testis, scrotum. Prostate 70 g smooth no nodules no induration.  Lymph: No cervical or inguinal lymphadenopathy. Skin: No  rashes, bruises or suspicious lesions. Neurologic: Grossly intact, no focal deficits, moving all 4 extremities. Psychiatric: Normal mood and affect.  Laboratory Data: Lab Results  Component Value Date   WBC 6.0 03/21/2023   HGB 14.6 03/21/2023   HCT 45.0 03/21/2023   MCV 91.5 03/21/2023   PLT 258 03/21/2023    Lab Results  Component Value Date   CREATININE 1.07 03/21/2023   PCP records reviewed  Prior [facility] records reviewed Prior PSA levels reviewed  CT reading from 2018 reviewed-prostate deemed unremarkable   Assessment:  -BPH with symptoms, benign feeling gland.  Patient does not want treatment at this point  -Elevated PSA, over the past 7 years fairly stable pattern  but significantly elevated for a 53 year old  Plan:-  -I will see if we can order an ExoDx test on him to better characterize the PSA/risk for high-grade disease  -We will call with results and any follow-up thereafter  -He is a Medicaid patient, so this test may be difficult to obtain

## 2024-05-01 ENCOUNTER — Ambulatory Visit (INDEPENDENT_AMBULATORY_CARE_PROVIDER_SITE_OTHER): Payer: MEDICAID | Admitting: Urology

## 2024-05-01 VITALS — BP 110/72

## 2024-05-01 DIAGNOSIS — R972 Elevated prostate specific antigen [PSA]: Secondary | ICD-10-CM | POA: Diagnosis not present

## 2024-05-01 DIAGNOSIS — N401 Enlarged prostate with lower urinary tract symptoms: Secondary | ICD-10-CM | POA: Diagnosis not present

## 2024-05-01 LAB — URINALYSIS, ROUTINE W REFLEX MICROSCOPIC
Bilirubin, UA: NEGATIVE
Glucose, UA: NEGATIVE
Ketones, UA: NEGATIVE
Leukocytes,UA: NEGATIVE
Nitrite, UA: NEGATIVE
Protein,UA: NEGATIVE
RBC, UA: NEGATIVE
Specific Gravity, UA: 1.02 (ref 1.005–1.030)
Urobilinogen, Ur: 0.2 mg/dL (ref 0.2–1.0)
pH, UA: 6 (ref 5.0–7.5)

## 2024-05-01 LAB — BLADDER SCAN AMB NON-IMAGING: Scan Result: 13

## 2024-05-01 NOTE — Progress Notes (Signed)
 Bladder Scan completed today.  Patient can void prior to the bladder scan. Bladder scan result: 13  Performed By: Exie DASEN. CMA

## 2024-05-04 ENCOUNTER — Encounter: Payer: Self-pay | Admitting: Orthopedic Surgery

## 2024-05-04 ENCOUNTER — Ambulatory Visit: Payer: MEDICAID | Admitting: Orthopedic Surgery

## 2024-05-04 ENCOUNTER — Other Ambulatory Visit (INDEPENDENT_AMBULATORY_CARE_PROVIDER_SITE_OTHER): Payer: MEDICAID

## 2024-05-04 DIAGNOSIS — M545 Low back pain, unspecified: Secondary | ICD-10-CM | POA: Diagnosis not present

## 2024-05-04 DIAGNOSIS — M25571 Pain in right ankle and joints of right foot: Secondary | ICD-10-CM

## 2024-05-04 NOTE — Progress Notes (Signed)
   There were no vitals taken for this visit.  There is no height or weight on file to calculate BMI.  Chief Complaint  Patient presents with   Leg Pain    Right femur     No diagnosis found.  DOI/DOS/ Date: surgery at [facility]     September 03, 2022

## 2024-05-04 NOTE — Patient Instructions (Addendum)
 Shawn [name]  05/04/2024     @PREFPERIOPPHARMACY @   Your procedure is scheduled on 05/09/2024.   [facility] to [name] at 9:05 A.M.   Call this number if you have problems the morning of surgery:   [phone]  If you experience any cold or flu symptoms such as cough, fever, chills, shortness of breath, etc. between now and your scheduled surgery, please notify us  at the above number.   Remember:   Please Follow the diet and prep instructions given to you by Dr Ivonne office.      You may drink clear liquids until 7:05 AM .    Clear liquids allowed are:                    Water , Juice (No red color; non-citric and without pulp; diabetics please choose diet or no sugar options), Carbonated beverages (diabetics please choose diet or no sugar options), Clear Tea (No creamer, milk, or cream, including half & half and powdered creamer), Black Coffee Only (No creamer, milk or cream, including half & half and powdered creamer), Plain Jell-O Only (No red color; diabetics please choose no sugar options), Clear Sports drink (No red color; diabetics please choose diet or no sugar options), and Plain Popsicles Only (No red color; diabetics please choose no sugar options)    Take these medicines the morning of surgery with A SIP OF WATER  : Klonopin Flexeril  Dexilant  Pepcid  Neurontin  Meloxicam Prilosec and Vraylar    Do not wear jewelry, make-up or nail polish, including gel polish,  artificial nails, or any other type of covering on natural nails (fingers and  toes).  Do not wear lotions, powders, or perfumes, or deodorant.  Do not shave 48 hours prior to surgery.  Men may shave face and neck.  Do not bring valuables to the hospital.  [name] is not responsible for any belongings or valuables.  Contacts, dentures or bridgework may not be worn into surgery.  Leave your suitcase in the car.  After surgery it may be brought to your room.  For patients admitted to the hospital, discharge  time will be determined by your treatment team.  Patients discharged the day of surgery will not be allowed to drive home.    Name and phone number of your driver:   Family  Special instructions:  N/A  Please read over the following fact sheets that you were given. Anesthesia Post-op Instructions  Colonoscopy, Adult A colonoscopy is a procedure to look at the entire large intestine. This procedure is done using a long, thin, flexible tube that has a camera on the end. You may have a colonoscopy: As a part of normal colorectal screening. If you have certain symptoms, such as: A low number of red blood cells in your blood (anemia). Diarrhea that does not go away. Pain in your abdomen. Blood in your stool. A colonoscopy can help screen for and diagnose medical problems, including: An abnormal growth of cells or tissue (tumor). Abnormal growths within the lining of your intestine (polyps). Inflammation. Areas of bleeding. Tell your health care provider about: Any allergies you have. All medicines you are taking, including vitamins, herbs, eye drops, creams, and over-the-counter medicines. Any problems you or family members have had with anesthetic medicines. Any bleeding problems you have. Any surgeries you have had. Any medical conditions you have. Any problems you have had with having bowel movements. Whether you are pregnant or may be pregnant. What are the risks?  Generally, this is a safe procedure. However, problems may occur, including: Bleeding. Damage to your intestine. Allergic reactions to medicines given during the procedure. Infection. This is rare. What happens before the procedure? Eating and drinking restrictions Follow instructions from your health care provider about eating or drinking restrictions, which may include: A few days before the procedure: Follow a low-fiber diet. Avoid nuts, seeds, dried fruit, raw fruits, and vegetables. 1-3 days before the  procedure: Eat only gelatin dessert or ice pops. Drink only clear liquids, such as water , clear juice, clear broth or bouillon, black coffee or tea, or clear soft drinks or sports drinks. Avoid liquids that contain red or purple dye. The day of the procedure: Do not eat solid foods. You may continue to drink clear liquids until up to 2 hours before the procedure. Do not eat or drink anything starting 2 hours before the procedure, or within the time period that your health care provider recommends. Bowel prep If you were prescribed a bowel prep to take by mouth (orally) to clean out your colon: Take it as told by your health care provider. Starting the day before your procedure, you will need to drink a large amount of liquid medicine. The liquid will cause you to have many bowel movements of loose stool until your stool becomes almost clear or light green. If your skin or the opening between the buttocks (anus) gets irritated from diarrhea, you may relieve the irritation using: Wipes with medicine in them, such as adult wet wipes with aloe and vitamin E. A product to soothe skin, such as petroleum jelly. If you vomit while drinking the bowel prep: Take a break for up to 60 minutes. Begin the bowel prep again. Call your health care provider if you keep vomiting or you cannot take the bowel prep without vomiting. To clean out your colon, you may also be given: Laxative medicines. These help you have a bowel movement. Instructions for enema use. An enema is liquid medicine injected into your rectum. Medicines Ask your health care provider about: Changing or stopping your regular medicines or supplements. This is especially important if you are taking iron supplements, diabetes medicines, or blood thinners. Taking medicines such as aspirin and ibuprofen . These medicines can thin your blood. Do not take these medicines unless your health care provider tells you to take them. Taking  over-the-counter medicines, vitamins, herbs, and supplements. General instructions Ask your health care provider what steps will be taken to help prevent infection. These may include washing skin with a germ-killing soap. If you will be going home right after the procedure, plan to have a responsible adult: Take you home from the hospital or clinic. You will not be allowed to drive. Care for you for the time you are told. What happens during the procedure?  An IV will be inserted into one of your veins. You will be given a medicine to make you fall asleep (general anesthetic). You will lie on your side with your knees bent. A lubricant will be put on the tube. Then the tube will be: Inserted into your anus. Gently eased through all parts of your large intestine. Air will be sent into your colon to keep it open. This may cause some pressure or cramping. Images will be taken with the camera and will appear on a screen. A small tissue sample may be removed to be looked at under a microscope (biopsy). The tissue may be sent to a lab for testing if any  signs of problems are found. If small polyps are found, they may be removed and checked for cancer cells. When the procedure is finished, the tube will be removed. The procedure may vary among health care providers and hospitals. What happens after the procedure? Your blood pressure, heart rate, breathing rate, and blood oxygen level will be monitored until you leave the hospital or clinic. You may have a small amount of blood in your stool. You may pass gas and have mild cramping or bloating in your abdomen. This is caused by the air that was used to open your colon during the exam. If you were given a sedative during the procedure, it can affect you for several hours. Do not drive or operate machinery until your health care provider says that it is safe. It is up to you to get the results of your procedure. Ask your health care provider, or the  department that is doing the procedure, when your results will be ready. Summary A colonoscopy is a procedure to look at the entire large intestine. Follow instructions from your health care provider about eating and drinking before the procedure. If you were prescribed an oral bowel prep to clean out your colon, take it as told by your health care provider. During the colonoscopy, a flexible tube with a camera on its end is inserted into the anus and then passed into all parts of the large intestine. This information is not intended to replace advice given to you by your health care provider. Make sure you discuss any questions you have with your health care provider. Document Revised: 12/07/2022 Document Reviewed: 06/17/2021 Elsevier Patient Education  2024 [employer]  Upper Endoscopy, Adult Upper endoscopy is a procedure to look inside the upper GI (gastrointestinal) tract. The upper GI tract is made up of: The esophagus. This is the part of the body that moves food from your mouth to your stomach. The stomach. The duodenum. This is the first part of your small intestine. This procedure is also called esophagogastroduodenoscopy (EGD) or gastroscopy. In this procedure, your health care provider passes a thin, flexible tube (endoscope) through your mouth and down your esophagus into your stomach and into your duodenum. A small camera is attached to the end of the tube. Images from the camera appear on a monitor in the exam room. During this procedure, your health care provider may also remove a small piece of tissue to be sent to a lab and examined under a microscope (biopsy). Your health care provider may do an upper endoscopy to diagnose cancers of the upper GI tract. You may also have this procedure to find the cause of other conditions, such as: Stomach pain. Heartburn. Pain or problems when swallowing. Nausea and vomiting. Stomach bleeding. Stomach ulcers. Tell a health care provider  about: Any allergies you have. All medicines you are taking, including vitamins, herbs, eye drops, creams, and over-the-counter medicines. Any problems you or family members have had with anesthetic medicines. Any bleeding problems you have. Any surgeries you have had. Any medical conditions you have. Whether you are pregnant or may be pregnant. What are the risks? Your healthcare provider will talk with you about risks. These may include: Infection. Bleeding. Allergic reactions to medicines. A tear or hole (perforation) in the esophagus, stomach, or duodenum. What happens before the procedure? When to stop eating and drinking Follow instructions from your health care provider about what you may eat and drink. These may include: 8 hours before your  procedure Stop eating most foods. Do not eat meat, fried foods, or fatty foods. Eat only light foods, such as toast or crackers. All liquids are okay except energy drinks and alcohol. 6 hours before your procedure Stop eating. Drink only clear liquids, such as water , clear fruit juice, black coffee, plain tea, and sports drinks. Do not drink energy drinks or alcohol. 2 hours before your procedure Stop drinking all liquids. You may be allowed to take medicines with small sips of water . If you do not follow your health care provider's instructions, your procedure may be delayed or canceled. Medicines Ask your health care provider about: Changing or stopping your regular medicines. This is especially important if you are taking diabetes medicines or blood thinners. Taking medicines such as aspirin and ibuprofen . These medicines can thin your blood. Do not take these medicines unless your health care provider tells you to take them. Taking over-the-counter medicines, vitamins, herbs, and supplements. General instructions If you will be going home right after the procedure, plan to have a responsible adult: Take you home from the hospital or  clinic. You will not be allowed to drive. Care for you for the time you are told. What happens during the procedure?  An IV will be inserted into one of your veins. You may be given one or more of the following: A medicine to help you relax (sedative). A medicine to numb the throat (local anesthetic). You will lie on your left side on an exam table. Your health care provider will pass the endoscope through your mouth and down your esophagus. Your health care provider will use the scope to check the inside of your esophagus, stomach, and duodenum. Biopsies may be taken. The endoscope will be removed. The procedure may vary among health care providers and hospitals. What happens after the procedure? Your blood pressure, heart rate, breathing rate, and blood oxygen level will be monitored until you leave the hospital or clinic. When your throat is no longer numb, you may be given some fluids to drink. If you were given a sedative during the procedure, it can affect you for several hours. Do not drive or operate machinery until your health care provider says that it is safe. It is up to you to get the results of your procedure. Ask your health care provider, or the department that is doing the procedure, when your results will be ready. Contact a health care provider if you: Have a sore throat that lasts longer than 1 day. Have a fever. Get help right away if you: Vomit blood or your vomit looks like coffee grounds. Have bloody, black, or tarry stools. Have a very bad sore throat or you cannot swallow. Have difficulty breathing or very bad pain in your chest or abdomen. These symptoms may be an emergency. Get help right away. Call 911. Do not wait to see if the symptoms will go away. Do not drive yourself to the hospital. Summary Upper endoscopy is a procedure to look inside the upper GI tract. During the procedure, an IV will be inserted into one of your veins. You may be given a medicine  to help you relax. The endoscope will be passed through your mouth and down your esophagus. Follow instructions from your health care provider about what you can eat and drink. This information is not intended to replace advice given to you by your health care provider. Make sure you discuss any questions you have with your health care provider. Document Revised:  02/03/2022 Document Reviewed: 02/03/2022 Elsevier Patient Education  2024 [employer]  Monitored Anesthesia Care Anesthesia refers to the techniques, procedures, and medicines that help a person stay safe and comfortable during surgery. Monitored anesthesia care, or sedation, is one type of anesthesia. You may have sedation if you do not need to be asleep for your procedure. Procedures that use sedation may include: Surgery to remove cataracts from your eyes. A dental procedure. A biopsy. This is when a tissue sample is removed and looked at under a microscope. You will be watched closely during your procedure. Your level of sedation or type of anesthesia may be changed to fit your needs. Tell a health care provider about: Any allergies you have. All medicines you are taking, including vitamins, herbs, eye drops, creams, and over-the-counter medicines. Any problems you or family members have had with anesthesia. Any bleeding problems you have. Any surgeries you have had. Any medical conditions or illnesses you have. This includes sleep apnea, cough, fever, or the flu. Whether you are pregnant or may be pregnant. Whether you use cigarettes, alcohol, or drugs. Any use of steroids, whether by mouth or as a cream. What are the risks? Your health care provider will talk with you about risks. These may include: Getting too much medicine (oversedation). Nausea. Allergic reactions to medicines. Trouble breathing. If this happens, a breathing tube may be used to help you breathe. It will be removed when you are awake and breathing on  your own. Heart trouble. Lung trouble. Confusion that gets better with time (emergence delirium). What happens before the procedure? When to stop eating and drinking Follow instructions from your health care provider about what you may eat and drink. These may include: 8 hours before your procedure Stop eating most foods. Do not eat meat, fried foods, or fatty foods. Eat only light foods, such as toast or crackers. All liquids are okay except energy drinks and alcohol. 6 hours before your procedure Stop eating. Drink only clear liquids, such as water , clear fruit juice, black coffee, plain tea, and sports drinks. Do not drink energy drinks or alcohol. 2 hours before your procedure Stop drinking all liquids. You may be allowed to take medicines with small sips of water . If you do not follow your health care provider's instructions, your procedure may be delayed or canceled. Medicines Ask your health care provider about: Changing or stopping your regular medicines. These include any diabetes medicines or blood thinners you take. Taking medicines such as aspirin and ibuprofen . These medicines can thin your blood. Do not take them unless your health care provider tells you to. Taking over-the-counter medicines, vitamins, herbs, and supplements. Testing You may have an exam or testing. You may have a blood or urine sample taken. General instructions Do not use any products that contain nicotine or tobacco for at least 4 weeks before the procedure. These products include cigarettes, chewing tobacco, and vaping devices, such as e-cigarettes. If you need help quitting, ask your health care provider. If you will be going home right after the procedure, plan to have a responsible adult: Take you home from the hospital or clinic. You will not be allowed to drive. Care for you for the time you are told. What happens during the procedure?  Your blood pressure, heart rate, breathing, level of  pain, and blood oxygen level will be monitored. An IV will be inserted into one of your veins. You may be given: A sedative. This helps you relax. Anesthesia. This will:  Numb certain areas of your body. Make you fall asleep for surgery. You will be given medicines as needed to keep you comfortable. The more medicine you are given, the deeper your level of sedation will be. Your level of sedation may be changed to fit your needs. There are three levels of sedation: Mild sedation. At this level, you may feel awake and relaxed. You will be able to follow directions. Moderate sedation. At this level, you will be sleepy. You may not remember the procedure. Deep sedation. At this level, you will be asleep. You will not remember the procedure. How you get the medicines will depend on your age and the procedure. They may be given as: A pill. This may be taken by mouth (orally) or inserted into the rectum. An injection. This may be into a vein or muscle. A spray through the nose. After your procedure is over, the medicine will be stopped. The procedure may vary among health care providers and hospitals. What happens after the procedure? Your blood pressure, heart rate, breathing rate, and blood oxygen level will be monitored until you leave the hospital or clinic. You may feel sleepy, clumsy, or nauseous. You may not remember what happened during or after the procedure. Sedation can affect you for several hours. Do not drive or use machinery until your health care provider says that it is safe. This information is not intended to replace advice given to you by your health care provider. Make sure you discuss any questions you have with your health care provider. Document Revised: 03/22/2022 Document Reviewed: 03/22/2022 Elsevier Patient Education  2024 [employer].

## 2024-05-04 NOTE — Progress Notes (Signed)
   Chief Complaint  Patient presents with   Leg Pain    Right femur    Ankle Pain    Right    Shawn [name] had ORIF of his distal femur at [facility] by Dr. Claris  He comes in today complaining of right hip and posterior knee pain as well as lateral right ankle pain  No history of new trauma  We evaluated him for the leg pain before sending for physical therapy for back related leg pain and he did not get the therapy  Right ankle tenderness in the sinus Tarsi  Physical exam findings include pronation of the foot Tenderness in the sinus Tarsi Normal subtalar and ankle joint motion No evidence of neurovascular compromise Motor strength and stability normal   DG Ankle Complete Right Result Date: 05/04/2024 Imaging right ankle Pain in the sinus tarsi no recent trauma Ankle mortise is intact.  Small ossicle of bone near the distal end of the fibula looks No evidence of subtalar arthritis or ankle arthritis Otherwise normal x-ray of the ankle  Assessment and plan  Encounter Diagnoses  Name Primary?   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

## 2024-05-04 NOTE — Patient Instructions (Signed)
 Physical therapy has been ordered for you at [facility]. They should call you to schedule, [phone] is the phone number to call, if you want to call to schedule.

## 2024-05-07 ENCOUNTER — Other Ambulatory Visit: Payer: Self-pay

## 2024-05-07 ENCOUNTER — Encounter (HOSPITAL_COMMUNITY)
Admission: RE | Admit: 2024-05-07 | Discharge: 2024-05-07 | Disposition: A | Discharge status: Home / Self Care | Payer: MEDICAID | Source: Ambulatory Visit | Attending: Internal Medicine | Admitting: Internal Medicine

## 2024-05-07 ENCOUNTER — Ambulatory Visit: Admitting: Orthopedic Surgery

## 2024-05-07 ENCOUNTER — Encounter (HOSPITAL_COMMUNITY): Payer: Self-pay

## 2024-05-07 DIAGNOSIS — I10 Essential (primary) hypertension: Secondary | ICD-10-CM | POA: Insufficient documentation

## 2024-05-07 DIAGNOSIS — Z01818 Encounter for other preprocedural examination: Secondary | ICD-10-CM | POA: Diagnosis present

## 2024-05-07 DIAGNOSIS — D500 Iron deficiency anemia secondary to blood loss (chronic): Secondary | ICD-10-CM | POA: Diagnosis not present

## 2024-05-07 LAB — CBC WITH DIFFERENTIAL/PLATELET
Abs Immature Granulocytes: 0.01 10*3/uL (ref 0.00–0.07)
Basophils Absolute: 0.1 10*3/uL (ref 0.0–0.1)
Basophils Relative: 1 %
Eosinophils Absolute: 0.2 10*3/uL (ref 0.0–0.5)
Eosinophils Relative: 2 %
HCT: 44.4 % (ref 39.0–52.0)
Hemoglobin: 14.6 g/dL (ref 13.0–17.0)
Immature Granulocytes: 0 %
Lymphocytes Relative: 25 %
Lymphs Abs: 1.8 10*3/uL (ref 0.7–4.0)
MCH: 29.1 pg (ref 26.0–34.0)
MCHC: 32.9 g/dL (ref 30.0–36.0)
MCV: 88.4 fL (ref 80.0–100.0)
Monocytes Absolute: 0.4 10*3/uL (ref 0.1–1.0)
Monocytes Relative: 6 %
Neutro Abs: 4.9 10*3/uL (ref 1.7–7.7)
Neutrophils Relative %: 66 %
Platelets: 286 10*3/uL (ref 150–400)
RBC: 5.02 MIL/uL (ref 4.22–5.81)
RDW: 13.7 % (ref 11.5–15.5)
WBC: 7.3 10*3/uL (ref 4.0–10.5)
nRBC: 0 % (ref 0.0–0.2)

## 2024-05-09 ENCOUNTER — Telehealth: Payer: Self-pay

## 2024-05-09 ENCOUNTER — Ambulatory Visit (HOSPITAL_COMMUNITY): Payer: MEDICAID

## 2024-05-09 ENCOUNTER — Encounter (HOSPITAL_COMMUNITY)
Admission: RE | Disposition: A | Discharge status: Home / Self Care | Payer: Self-pay | Source: Home / Self Care | Attending: Internal Medicine

## 2024-05-09 ENCOUNTER — Encounter (HOSPITAL_COMMUNITY): Payer: Self-pay | Admitting: Internal Medicine

## 2024-05-09 ENCOUNTER — Ambulatory Visit (HOSPITAL_COMMUNITY)
Admission: RE | Admit: 2024-05-09 | Discharge: 2024-05-09 | Disposition: A | Discharge status: Home / Self Care | Payer: MEDICAID | Attending: Internal Medicine | Admitting: Internal Medicine

## 2024-05-09 DIAGNOSIS — Z860100 Personal history of colon polyps, unspecified: Secondary | ICD-10-CM | POA: Diagnosis not present

## 2024-05-09 DIAGNOSIS — I10 Essential (primary) hypertension: Secondary | ICD-10-CM | POA: Insufficient documentation

## 2024-05-09 DIAGNOSIS — R1310 Dysphagia, unspecified: Secondary | ICD-10-CM

## 2024-05-09 DIAGNOSIS — Z87891 Personal history of nicotine dependence: Secondary | ICD-10-CM | POA: Insufficient documentation

## 2024-05-09 DIAGNOSIS — Z79899 Other long term (current) drug therapy: Secondary | ICD-10-CM | POA: Diagnosis not present

## 2024-05-09 DIAGNOSIS — Z8711 Personal history of peptic ulcer disease: Secondary | ICD-10-CM | POA: Insufficient documentation

## 2024-05-09 DIAGNOSIS — K2101 Gastro-esophageal reflux disease with esophagitis, with bleeding: Secondary | ICD-10-CM | POA: Insufficient documentation

## 2024-05-09 DIAGNOSIS — F250 Schizoaffective disorder, bipolar type: Secondary | ICD-10-CM | POA: Diagnosis not present

## 2024-05-09 DIAGNOSIS — K5730 Diverticulosis of large intestine without perforation or abscess without bleeding: Secondary | ICD-10-CM | POA: Diagnosis not present

## 2024-05-09 DIAGNOSIS — Z1211 Encounter for screening for malignant neoplasm of colon: Secondary | ICD-10-CM | POA: Insufficient documentation

## 2024-05-09 DIAGNOSIS — R1314 Dysphagia, pharyngoesophageal phase: Secondary | ICD-10-CM | POA: Diagnosis present

## 2024-05-09 DIAGNOSIS — J449 Chronic obstructive pulmonary disease, unspecified: Secondary | ICD-10-CM | POA: Diagnosis not present

## 2024-05-09 DIAGNOSIS — M1990 Unspecified osteoarthritis, unspecified site: Secondary | ICD-10-CM | POA: Insufficient documentation

## 2024-05-09 DIAGNOSIS — Z860101 Personal history of adenomatous and serrated colon polyps: Secondary | ICD-10-CM | POA: Insufficient documentation

## 2024-05-09 DIAGNOSIS — K449 Diaphragmatic hernia without obstruction or gangrene: Secondary | ICD-10-CM | POA: Diagnosis not present

## 2024-05-09 DIAGNOSIS — R112 Nausea with vomiting, unspecified: Secondary | ICD-10-CM | POA: Insufficient documentation

## 2024-05-09 HISTORY — PX: COLONOSCOPY: SHX5424

## 2024-05-09 HISTORY — PX: ESOPHAGOGASTRODUODENOSCOPY: SHX5428

## 2024-05-09 HISTORY — PX: ESOPHAGEAL DILATION: SHX303

## 2024-05-09 SURGERY — COLONOSCOPY
Anesthesia: General

## 2024-05-09 MED ORDER — LACTATED RINGERS IV SOLN: INTRAVENOUS | Status: DC

## 2024-05-09 MED ORDER — PROPOFOL 10 MG/ML IV BOLUS
INTRAVENOUS | Status: DC | PRN
Start: 2024-05-09 — End: 2024-05-09
  Administered 2024-05-09: 100 mg via INTRAVENOUS

## 2024-05-09 MED ORDER — PROPOFOL 500 MG/50ML IV EMUL
INTRAVENOUS | Status: DC | PRN
  Administered 2024-05-09: 200 ug/kg/min via INTRAVENOUS

## 2024-05-09 MED ORDER — LIDOCAINE 2% (20 MG/ML) 5 ML SYRINGE
INTRAMUSCULAR | Status: DC | PRN
  Administered 2024-05-09: 100 mg via INTRAVENOUS

## 2024-05-09 MED ORDER — LACTATED RINGERS IV SOLN
INTRAVENOUS | Status: DC | PRN
Start: 2024-05-09 — End: 2024-05-09

## 2024-05-09 MED ORDER — PHENYLEPHRINE 80 MCG/ML (10ML) SYRINGE FOR IV PUSH (FOR BLOOD PRESSURE SUPPORT)
PREFILLED_SYRINGE | INTRAVENOUS | Status: DC | PRN
Start: 2024-05-09 — End: 2024-05-09
  Administered 2024-05-09 (×4): 160 ug via INTRAVENOUS

## 2024-05-09 MED ORDER — DEXLANSOPRAZOLE 60 MG PO CPDR: 60.0000 mg | DELAYED_RELEASE_CAPSULE | Freq: Two times a day (BID) | ORAL | 3 refills | Status: DC

## 2024-05-09 MED ORDER — STERILE WATER FOR IRRIGATION IR SOLN
Status: DC | PRN
  Administered 2024-05-09: 60 mL

## 2024-05-09 NOTE — Telephone Encounter (Signed)
-----   Message from Donnavin Vandenbrink Dahlstedt sent at 05/09/2024 12:22 PM EDT ----- I am sorry, I meant an ExoDx test.  Not a 4K.

## 2024-05-09 NOTE — Telephone Encounter (Signed)
 Called pt to schedule ExoDx test per Md Dahlstedt pt did not answer lvm to c/b

## 2024-05-09 NOTE — H&P (Signed)
 @LOGO @   Primary Care Physician:  Shona Norleen PEDLAR, MD Primary Gastroenterologist:  Dr. Shaaron  Pre-Procedure History & Physical: HPI:  Shawn [name] is a 53 y.o. male here for surveillance colonoscopy history of serrated adenoma removed 2018.  Also esophageal dysphagia intermittent nausea and vomiting.  NSAID use.  EGD with esophageal dilation as feasible/appropriate per plan today as well.  Past Medical History:  Diagnosis Date   Anemia    Arthritis    Back pain    Bipolar 1 disorder (HCC)    Bipolar disorder (HCC)    Blood clots in brain    2005--  due to head injury with steel plate placed   COPD (chronic obstructive pulmonary disease) (HCC)    Diverticulitis    Erosive esophagitis    Family history of adverse reaction to anesthesia    he was adopted   GERD (gastroesophageal reflux disease)    Heart disease    irregular heart beat   Heart murmur    Hiatal hernia    HOH (hard of hearing)    biological parents are both deaf.   left ear is good.   Hyperlipidemia    Hypertension    IgG4 deficiency (HCC)    Lung nodules    Schizoaffective disorder, bipolar type (HCC)    Vomiting     Past Surgical History:  Procedure Laterality Date   BRAIN SURGERY  2005   after head injury, patient states had 2 large blood clots evacuated   COLONOSCOPY WITH ESOPHAGOGASTRODUODENOSCOPY (EGD) N/A 12/05/2013   MFM:fpoi erosive relfux/HH/melanosis coli/colonic diverticulosis. tubulovillous adenoma removed. next tcs 11/2016   COLONOSCOPY WITH PROPOFOL  N/A 01/19/2017   Dr. Shaaron: Diverticulosis, single 8 mm sessile serrated adenoma without dysplasia removed from the ascending colon, internal.  Next colonoscopy recommended in March 2023.   ESOPHAGOGASTRODUODENOSCOPY (EGD) WITH PROPOFOL  N/A 02/05/2019   Dr. Shaaron: Small hiatal hernia, mild erosive reflux esophagitis.  Esophagus dilated due to history of dysphagia.   FLEXIBLE BRONCHOSCOPY N/A 01/23/2016   Procedure: FLEXIBLE BRONCHOSCOPY;  Surgeon:  Dorise MARLA Fellers, MD;  Location: [facility] OR;  Service: Thoracic;  Laterality: N/A;   KNEE SURGERY Right 09/01/2022   Patient states he has pins in his knee and a rod that goes up to his hip.   LUNG BIOPSY N/A 01/23/2016   Procedure: LEFT LUNG BIOPSY;  Surgeon: Dorise MARLA Fellers, MD;  Location: [facility] OR;  Service: Thoracic;  Laterality: N/A;   MALONEY DILATION N/A 02/05/2019   Procedure: AGAPITO DILATION;  Surgeon: Shaaron Lamar HERO, MD;  Location: [facility];  Service: Endoscopy;  Laterality: N/A;   POLYPECTOMY  01/19/2017   Procedure: POLYPECTOMY;  Surgeon: Lamar HERO Shaaron, MD;  Location: [facility];  Service: Endoscopy;;  ascending colon polyp   VIDEO ASSISTED THORACOSCOPY Left 01/23/2016   Procedure: LEFT VIDEO ASSISTED THORACOSCOPY;  Surgeon: Dorise MARLA Fellers, MD;  Location: [facility] OR;  Service: Thoracic;  Laterality: Left;    Prior to Admission medications   Medication Sig Start Date End Date Taking? Authorizing Provider  clonazePAM (KLONOPIN) 0.5 MG tablet Take 0.5 mg by mouth 2 (two) times daily. 11/17/22  Yes [provider]  Cyanocobalamin (B-12 PO) Take by mouth daily.   Yes [provider]  cyclobenzaprine  (FLEXERIL ) 10 MG tablet Take 10 mg by mouth daily.   Yes [provider]  dexlansoprazole  (DEXILANT ) 60 MG capsule Take 60 mg by mouth daily.   Yes [provider]  famotidine  (PEPCID ) 20 MG tablet SMARTSIG:1 Tablet(s)  By Mouth Every Evening 10/23/23  Yes [provider]  gabapentin  (NEURONTIN ) 300 MG capsule Take 300 mg by mouth 3 (three) times daily. 10/21/23  Yes [provider]  ibuprofen  (ADVIL ) 800 MG tablet Take 1 tablet (800 mg total) by mouth every 8 (eight) hours as needed. Patient taking differently: Take 800 mg by mouth 3 (three) times daily. 11/17/23  Yes Margrette Taft BRAVO, MD  MELATONIN PO Take by mouth at bedtime.   Yes [provider]  meloxicam (MOBIC) 15 MG tablet Take 15 mg by mouth daily.   Yes [provider]  methocarbamol  (ROBAXIN ) 750 MG tablet Take 1 tablet (750 mg total) by mouth 4 (four) times daily. Patient taking differently: Take 750 mg by mouth daily. 12/20/23  Yes Margrette Taft BRAVO, MD  Multiple Vitamin (MULTIVITAMIN) tablet Take 1 tablet by mouth daily.   Yes [provider]  omeprazole (PRILOSEC) 20 MG capsule Take 20 mg by mouth daily.   Yes [provider]  VRAYLAR 3 MG capsule Take 1 capsule by mouth daily. 09/04/22  Yes [provider]    Allergies as of 03/26/2024   (No Known Allergies)    Family History  Adopted: Yes  Problem Relation Age of Onset   Alzheimer's disease Other    Parkinson's disease Other    Mental illness Other    Colon cancer Neg Hx        adopted at 4 months old, unsure about GI history of parents     Social History   Socioeconomic History   Marital status: Divorced    Spouse name: Not on file   Number of children: 1   Years of education: Not on file   Highest education level: Not on file  Occupational History   Occupation: disabled  Tobacco Use   Smoking status: Former    Current packs/day: 0.00    Average packs/day: 0.8 packs/day for 25.0 years (18.8 ttl pk-yrs)    Types: Cigars, Cigarettes    Start date: 07/09/1989    Quit date: 07/09/2014    Years since quitting: 9.8    Passive exposure: Never   Smokeless tobacco: Current    Types: Snuff   Tobacco comments:    pt uses dip tobacco  Vaping Use   Vaping status: Never Used  Substance and Sexual Activity   Alcohol use: Yes    Alcohol/week: 0.0 standard drinks of alcohol    Comment: occ   Drug use: Never   Sexual activity: Not Currently    Birth control/protection: None  Other Topics Concern   Not on file  Social History Narrative   He is originally from [facility]. He was adopted as a Development worker, international aid. Has always lived in [place] & [place]. Previously have traveled to [place], [place] , , [place], & [place]. Remote travel to [place]. Previously has worked doing Holiday representative.  No known asbestos exposure. Lived in a house with mold around 2001-2002 as well as 2006. Has 2 dogs currently. No bird exposure. No hot tub exposure. Mainly walks for fun. He was incarcerated for 2 years previously. No known TB exposure. Had previous negative PPDs last around 2010. Never lived in a homeless shelter but has eaten at them before.    Social Drivers of Corporate investment banker Strain: Not on file  Food Insecurity: Not on file  Transportation Needs: Not on file  Physical Activity: Not on file  Stress: Not on file  Social Connections: Not on file  Intimate Partner Violence:  Not on file    Review of Systems: See HPI, otherwise negative ROS  Physical Exam: BP (!) 135/90 (BP Location: Right Arm)   Temp 97.8 F (36.6 C)   Resp 18   Ht 5' 6 (1.676 m)   Wt 95.7 kg   SpO2 96%   BMI 34.05 kg/m  General:   Alert,  Well-developed, well-nourished, pleasant and cooperative in NAD Neck:  Supple; no masses or thyromegaly. No significant cervical adenopathy. Lungs:  Clear throughout to auscultation.   No wheezes, crackles, or rhonchi. No acute distress. Heart:  Regular rate and rhythm; no murmurs, clicks, rubs,  or gallops. Abdomen: Non-distended, normal bowel sounds.  Soft and nontender without appreciable mass or hepatosplenomegaly.   Impression/Plan: 53 year old gentleman here for surveillance colonoscopy due to a history of polyps.  Also nausea vomiting intermittent dysphagia GERD NSAID use EGD with esophageal dilation as feasible appropriate today per plan as well. The risks, benefits, limitations, imponderables and alternatives regarding both EGD and colonoscopy have been reviewed with the patient. Questions have been answered. All parties agreeable.    Upper GI tract symptoms are improved with Dexilant .  Of note, patient denies cannabis use.     Notice: This dictation was prepared with Dragon dictation along with smaller phrase technology. Any transcriptional errors  that result from this process are unintentional and may not be corrected upon review.

## 2024-05-09 NOTE — Anesthesia Preprocedure Evaluation (Addendum)
 Anesthesia Evaluation  Patient identified by MRN, date of birth, ID band Patient awake    Reviewed: Allergy & Precautions, H&P , NPO status , Patient's Chart, lab work & pertinent test results  History of Anesthesia Complications (+) Family history of anesthesia reaction  Airway Mallampati: II  TM Distance: >3 FB Neck ROM: Full    Dental no notable dental hx.    Pulmonary COPD, former smoker   Pulmonary exam normal breath sounds clear to auscultation       Cardiovascular hypertension, + DOE  Normal cardiovascular exam+ Valvular Problems/Murmurs  Rhythm:Regular Rate:Normal     Neuro/Psych  PSYCHIATRIC DISORDERS Anxiety  Bipolar Disorder Schizophrenia   Neuromuscular disease    GI/Hepatic Neg liver ROS, hiatal hernia, PUD,GERD  ,,  Endo/Other  negative endocrine ROS    Renal/GU negative Renal ROS  negative genitourinary   Musculoskeletal  (+) Arthritis ,    Abdominal   Peds negative pediatric ROS (+)  Hematology  (+) Blood dyscrasia, anemia   Anesthesia Other Findings   Reproductive/Obstetrics negative OB ROS                              Anesthesia Physical Anesthesia Plan  ASA: 2  Anesthesia Plan: General   Post-op Pain Management:    Induction: Intravenous  PONV Risk Score and Plan: Propofol  infusion  Airway Management Planned: Nasal Cannula  Additional Equipment:   Intra-op Plan:   Post-operative Plan:   Informed Consent: I have reviewed the patients History and Physical, chart, labs and discussed the procedure including the risks, benefits and alternatives for the proposed anesthesia with the patient or authorized representative who has indicated his/her understanding and acceptance.     Dental advisory given  Plan Discussed with: CRNA  Anesthesia Plan Comments:         Anesthesia Quick Evaluation

## 2024-05-09 NOTE — Anesthesia Postprocedure Evaluation (Signed)
 Anesthesia Post Note  Patient: Shawn [name]  Procedure(s) Performed: COLONOSCOPY EGD (ESOPHAGOGASTRODUODENOSCOPY) DILATION, ESOPHAGUS  Patient location during evaluation: PACU Anesthesia Type: General Level of consciousness: awake and alert Pain management: pain level controlled Vital Signs Assessment: post-procedure vital signs reviewed and stable Respiratory status: spontaneous breathing, nonlabored ventilation, respiratory function stable and patient connected to nasal cannula oxygen Cardiovascular status: stable and blood pressure returned to baseline Postop Assessment: no apparent nausea or vomiting Anesthetic complications: no   No notable events documented.   Last Vitals:  Vitals:   05/09/24 1103 05/09/24 1113  BP: (!) 84/57 100/80  Pulse:    Resp:    Temp:    SpO2:      Last Pain:  Vitals:   05/09/24 1103  TempSrc:   PainSc: 0-No pain                 Andrea Limes

## 2024-05-09 NOTE — Telephone Encounter (Signed)
-----   Message from Shawn [name] sent at 05/09/2024 11:10 AM EDT ----- Increase Dexilant  to 60 mg orally twice daily 60 mg before breakfast and supper.  Dispense 60 with 3 refills.  Patient needs an office visit with Sonny Kerns in 3 months

## 2024-05-09 NOTE — Transfer of Care (Signed)
 Immediate Anesthesia Transfer of Care Note  Patient: Shawn [name]  Procedure(s) Performed: COLONOSCOPY EGD (ESOPHAGOGASTRODUODENOSCOPY) DILATION, ESOPHAGUS  Patient Location: Endoscopy Unit  Anesthesia Type:General  Level of Consciousness: awake, alert , oriented, and patient cooperative  Airway & Oxygen Therapy: Patient Spontanous Breathing and Patient connected to face mask oxygen  Post-op Assessment: Report given to RN, Post -op Vital signs reviewed and stable, and Patient moving all extremities X 4  Post vital signs: Reviewed and stable  Last Vitals:  Vitals Value Taken Time  BP 87/52 05/09/24 11;05  Temp 36.6 C 05/09/24 10:58  Pulse 67 05/09/24 10:58  Resp 25 05/09/24 10:58  SpO2 93 % 05/09/24 10:58    Last Pain:  Vitals:   05/09/24 1058  TempSrc: Oral  PainSc: Asleep      Patients Stated Pain Goal: 2 (05/09/24 0905)  Complications: No notable events documented.

## 2024-05-09 NOTE — Discharge Instructions (Addendum)
 EGD Discharge instructions Please read the instructions outlined below and refer to this sheet in the next few weeks. These discharge instructions provide you with general information on caring for yourself after you leave the hospital. Your doctor may also give you specific instructions. While your treatment has been planned according to the most current medical practices available, unavoidable complications occasionally occur. If you have any problems or questions after discharge, please call your doctor. ACTIVITY You may resume your regular activity but move at a slower pace for the next 24 hours.  Take frequent rest periods for the next 24 hours.  Walking will help expel (get rid of) the air and reduce the bloated feeling in your abdomen.  No driving for 24 hours (because of the anesthesia (medicine) used during the test).  You may shower.  Do not sign any important legal documents or operate any machinery for 24 hours (because of the anesthesia used during the test).  NUTRITION Drink plenty of fluids.  You may resume your normal diet.  Begin with a light meal and progress to your normal diet.  Avoid alcoholic beverages for 24 hours or as instructed by your caregiver.  MEDICATIONS You may resume your normal medications unless your caregiver tells you otherwise.  WHAT YOU CAN EXPECT TODAY You may experience abdominal discomfort such as a feeling of fullness or "gas" pains.  FOLLOW-UP Your doctor will discuss the results of your test with you.  SEEK IMMEDIATE MEDICAL ATTENTION IF ANY OF THE FOLLOWING OCCUR: Excessive nausea (feeling sick to your stomach) and/or vomiting.  Severe abdominal pain and distention (swelling).  Trouble swallowing.  Temperature over 101 F (37.8 C).  Rectal bleeding or vomiting of blood.    Colonoscopy Discharge Instructions  Read the instructions outlined below and refer to this sheet in the next few weeks. These discharge instructions provide you with  general information on caring for yourself after you leave the hospital. Your doctor may also give you specific instructions. While your treatment has been planned according to the most current medical practices available, unavoidable complications occasionally occur. If you have any problems or questions after discharge, call Dr. Shaaron at [phone]. ACTIVITY You may resume your regular activity, but move at a slower pace for the next 24 hours.  Take frequent rest periods for the next 24 hours.  Walking will help get rid of the air and reduce the bloated feeling in your belly (abdomen).  No driving for 24 hours (because of the medicine (anesthesia) used during the test).   Do not sign any important legal documents or operate any machinery for 24 hours (because of the anesthesia used during the test).  NUTRITION Drink plenty of fluids.  You may resume your normal diet as instructed by your doctor.  Begin with a light meal and progress to your normal diet. Heavy or fried foods are harder to digest and may make you feel sick to your stomach (nauseated).  Avoid alcoholic beverages for 24 hours or as instructed.  MEDICATIONS You may resume your normal medications unless your doctor tells you otherwise.  WHAT YOU CAN EXPECT TODAY Some feelings of bloating in the abdomen.  Passage of more gas than usual.  Spotting of blood in your stool or on the toilet paper.  IF YOU HAD POLYPS REMOVED DURING THE COLONOSCOPY: No aspirin products for 7 days or as instructed.  No alcohol for 7 days or as instructed.  Eat a soft diet for the next 24 hours.  FINDING  OUT THE RESULTS OF YOUR TEST Not all test results are available during your visit. If your test results are not back during the visit, make an appointment with your caregiver to find out the results. Do not assume everything is normal if you have not heard from your caregiver or the medical facility. It is important for you to follow up on all of your test  results.  SEEK IMMEDIATE MEDICAL ATTENTION IF: You have more than a spotting of blood in your stool.  Your belly is swollen (abdominal distention).  You are nauseated or vomiting.  You have a temperature over 101.  You have abdominal pain or discomfort that is severe or gets worse throughout the day.     You have a large hiatal hernia and significant acid reflux burns in your esophagus.  Your esophagus was stretched.  We need to get your reflux symptoms under control with medication  Ultimately, you will likely benefit from surgical repair of your hiatal hernia and tightening the flap valve between your esophagus and stomach  For the next 3 months, we will increase your Dexilant  to 60 mg orally twice daily 30 minutes for breakfast and supper-new prescription called into your pharmacy from my office  No polyps found in your colon today.  You do have diverticulosis.  I recommend a repeat surveillance colonoscopy in 5 years  Office visit with us  in 3 months  At patient request, I called Mikhael Hendriks at [phone] findings and recommendations in detail.

## 2024-05-09 NOTE — Telephone Encounter (Signed)
 Rx sent to pharmacy on file.   Mandy/Ladonna: please arrange f/u in 3 mths with Sonny

## 2024-05-09 NOTE — Op Note (Signed)
 [facility] Patient Name: Shawn [name] Procedure Date: 05/09/2024 10:03 AM MRN: [id number] Date of Birth: August 28, 1971 Attending MD: Lamar Ozell Hollingshead , MD, [id number] CSN: [id number] Age: 53 Admit Type: Outpatient Procedure:                Upper GI endoscopy Indications:              Dysphagia Providers:                Lamar Ozell Hollingshead, MD, Crystal Page, Bascom Blush Referring MD:              Medicines:                Propofol  per Anesthesia Complications:            No immediate complications. Estimated Blood Loss:     Estimated blood loss: none. Procedure:                Pre-Anesthesia Assessment:                           - Prior to the procedure, a History and Physical                            was performed, and patient medications and                            allergies were reviewed. The patient's tolerance of                            previous anesthesia was also reviewed. The risks                            and benefits of the procedure and the sedation                            options and risks were discussed with the patient.                            All questions were answered, and informed consent                            was obtained. Prior Anticoagulants: The patient has                            taken no anticoagulant or antiplatelet agents. ASA                            Grade Assessment: III - A patient with severe                            systemic disease. After reviewing the risks and                            benefits, the patient was deemed in satisfactory  condition to undergo the procedure.                           After obtaining informed consent, the endoscope was                            passed under direct vision. Throughout the                            procedure, the patient's blood pressure, pulse, and                            oxygen saturations were monitored continuously. The                             GIF-H190 ([id number]) scope was introduced through the                            mouth, and advanced to the second part of duodenum.                            The upper GI endoscopy was accomplished without                            difficulty. The patient tolerated the procedure                            well. Scope In: 10:28:03 AM Scope Out: 10:35:56 AM Total Procedure Duration: 0 hours 7 minutes 53 seconds  Findings:      LA Grade C (one or more mucosal breaks continuous between tops of 2 or       more mucosal folds, less than 75% circumference) esophagitis with       bleeding was found. No Barrett's epithelium seen. The scope was       withdrawn. Dilation was performed with a Maloney dilator with mild       resistance at 56 Fr. The dilation site was examined following endoscope       reinsertion and showed no change. Estimated blood loss: none.      A large hiatal hernia was present (approximately 6 to 7 cm). Gastric       mucosa appeared normal.      The duodenal bulb and second portion of the duodenum were normal. Impression:               - LA Grade C esophagitis with bleeding. Dilated.                           - Large hiatal hernia.                           - Normal duodenal bulb and second portion of the                            duodenum.                           -  No specimens collected. Patient has a patulous EG                            junction large hiatal hernia and florid reflux in.                            Persisting reflux spite of Dexilant  60 mg daily. He                            may ultimately be best served with hiatal hernia                            repair and fundoplication. Would like to see his                            reflux controlled on pharmacal logic therapy prior                            to surgical referral. Moderate Sedation:      Moderate (conscious) sedation was personally administered by an       anesthesia professional.  The following parameters were monitored: oxygen       saturation, heart rate, blood pressure, respiratory rate, EKG, adequacy       of pulmonary ventilation, and response to care. Recommendation:           - Patient has a contact number available for                            emergencies. The signs and symptoms of potential                            delayed complications were discussed with the                            patient. Return to normal activities tomorrow.                            Written discharge instructions were provided to the                            patient.                           - Advance diet as tolerated.                           - Continue present medications. Temporarily we will                            increase Dexilant  to 60 mg twice daily 30 minutes                            before breakfast and supper.                           -  Advance diet as tolerated.                           - Return to my office in 3 months. Colonoscopy                            report. Procedure Code(s):        --- Professional ---                           [phone], Esophagogastroduodenoscopy, flexible,                            transoral; diagnostic, including collection of                            specimen(s) by brushing or washing, when performed                            (separate procedure)                           43450, Dilation of esophagus, by unguided sound or                            bougie, single or multiple passes Diagnosis Code(s):        --- Professional ---                           K20.91, Esophagitis, unspecified with bleeding                           K44.9, Diaphragmatic hernia without obstruction or                            gangrene                           R13.10, Dysphagia, unspecified CPT copyright 2022 American Medical Association. All rights reserved. The codes documented in this report are preliminary and upon coder review may  be  revised to meet current compliance requirements. Lamar HERO. Manha Amato, MD Lamar Ozell Hollingshead, MD 05/09/2024 10:59:14 AM This report has been signed electronically. Number of Addenda: 0

## 2024-05-09 NOTE — Op Note (Signed)
 [employer] [facility] Patient Name: Shawn [name] Procedure Date: 05/09/2024 10:02 AM MRN: [id number] Date of Birth: 28-Nov-1970 Attending MD: Lamar Ozell Hollingshead , MD, [id number] CSN: [id number] Age: 53 Admit Type: Outpatient Procedure:                Colonoscopy Indications:              High risk colon cancer surveillance: Personal                            history of colonic polyps Providers:                Lamar Ozell Hollingshead, MD, Crystal Page, Bascom Blush Referring MD:              Medicines:                Propofol  per Anesthesia Complications:            No immediate complications. Estimated Blood Loss:     Estimated blood loss: none. Procedure:                Pre-Anesthesia Assessment:                           - Prior to the procedure, a History and Physical                            was performed, and patient medications and                            allergies were reviewed. The patient's tolerance of                            previous anesthesia was also reviewed. The risks                            and benefits of the procedure and the sedation                            options and risks were discussed with the patient.                            All questions were answered, and informed consent                            was obtained. Prior Anticoagulants: The patient has                            taken no anticoagulant or antiplatelet agents. ASA                            Grade Assessment: III - A patient with severe                            systemic disease. After reviewing the risks and  benefits, the patient was deemed in satisfactory                            condition to undergo the procedure.                           After obtaining informed consent, the colonoscope                            was passed under direct vision. Throughout the                            procedure, the patient's blood pressure, pulse, and                             oxygen saturations were monitored continuously. The                            [id number]) scope was introduced through the                            anus and advanced to the the ileocecal valve. The                            colonoscopy was performed without difficulty. The                            patient tolerated the procedure well. The quality                            of the bowel preparation was adequate. The terminal                            ileum, ileocecal valve, appendiceal orifice, and                            rectum were photographed. Scope In: 10:40:52 AM Scope Out: 10:53:47 AM Scope Withdrawal Time: 0 hours 7 minutes 16 seconds  Total Procedure Duration: 0 hours 12 minutes 55 seconds  Findings:      The perianal and digital rectal examinations were normal.      Scattered medium-mouthed diverticula were found in the entire colon.      The exam was otherwise without abnormality on direct and retroflexion       views. Impression:               - Diverticulosis in the entire examined colon.                           - The examination was otherwise normal on direct                            and retroflexion views.                           - No specimens  collected. Moderate Sedation:      Moderate (conscious) sedation was personally administered by an       anesthesia professional. The following parameters were monitored: oxygen       saturation, heart rate, blood pressure, respiratory rate, EKG, adequacy       of pulmonary ventilation, and response to care. Recommendation:           - Patient has a contact number available for                            emergencies. The signs and symptoms of potential                            delayed complications were discussed with the                            patient. Return to normal activities tomorrow.                            Written discharge instructions were provided to the                             patient.                           - Advance diet as tolerated.                           - Continue present medications.                           - Repeat colonoscopy in 5 years for surveillance.                           - Return to GI office in 3 months. See EGD report. Procedure Code(s):        --- Professional ---                           [phone], Colonoscopy, flexible; diagnostic, including                            collection of specimen(s) by brushing or washing,                            when performed (separate procedure) Diagnosis Code(s):        --- Professional ---                           Z86.010, Personal history of colonic polyps                           K57.30, Diverticulosis of large intestine without                            perforation or abscess without bleeding CPT copyright 2022 American Medical Association. All rights reserved. The codes documented  in this report are preliminary and upon coder review may  be revised to meet current compliance requirements. Lamar [name], MD Lamar Ozell Hollingshead, MD 05/09/2024 11:05:18 AM This report has been signed electronically. Number of Addenda: 0

## 2024-05-10 ENCOUNTER — Encounter (HOSPITAL_COMMUNITY): Payer: Self-pay | Admitting: Internal Medicine

## 2024-06-07 ENCOUNTER — Ambulatory Visit: Payer: MEDICAID

## 2024-06-07 DIAGNOSIS — R972 Elevated prostate specific antigen [PSA]: Secondary | ICD-10-CM | POA: Diagnosis not present

## 2024-06-07 NOTE — Progress Notes (Signed)
 Pt seen today to give urine sample for exodx test per MD Dahlstedt   Tracking# [id number] Confirmation# [id number]

## 2024-06-14 ENCOUNTER — Encounter: Payer: Self-pay | Admitting: Urology

## 2024-06-20 ENCOUNTER — Ambulatory Visit: Payer: Self-pay | Admitting: Urology

## 2024-06-22 ENCOUNTER — Telehealth: Payer: Self-pay

## 2024-06-22 NOTE — Telephone Encounter (Signed)
 Patient is made aware and voiced understanding. Patient is scheduled for PSA labs and office visited.

## 2024-06-22 NOTE — Telephone Encounter (Signed)
-----   Message from Dhilan Brauer Dahlstedt sent at 06/20/2024  3:51 PM EDT ----- Please call the patient.  Let him know that the ExoDx test score was fairly low.  It does not say that he does not have prostate cancer, just that his risk of a more aggressive prostate cancer is pretty low.  I am okay with following his PSA only, I would recommend he come back in 6 months with a PSA.  Can you get that scheduled?  Thank you ----- Message ----- From: Gretta Carlos SAUNDERS, CMA Sent: 06/14/2024   4:19 PM EDT To: Garnette Shack, MD  Please review and advised
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
## 2024-07-02 ENCOUNTER — Ambulatory Visit (HOSPITAL_COMMUNITY): Payer: MEDICAID | Attending: Orthopedic Surgery

## 2024-07-02 ENCOUNTER — Other Ambulatory Visit: Payer: Self-pay

## 2024-07-02 ENCOUNTER — Encounter (HOSPITAL_COMMUNITY): Payer: Self-pay

## 2024-07-02 DIAGNOSIS — Z7409 Other reduced mobility: Secondary | ICD-10-CM | POA: Diagnosis present

## 2024-07-02 DIAGNOSIS — G8929 Other chronic pain: Secondary | ICD-10-CM | POA: Diagnosis present

## 2024-07-02 DIAGNOSIS — M545 Low back pain, unspecified: Secondary | ICD-10-CM | POA: Diagnosis present

## 2024-07-02 DIAGNOSIS — R262 Difficulty in walking, not elsewhere classified: Secondary | ICD-10-CM | POA: Insufficient documentation

## 2024-07-12 ENCOUNTER — Ambulatory Visit (HOSPITAL_COMMUNITY): Payer: MEDICAID | Attending: Orthopedic Surgery

## 2024-07-12 ENCOUNTER — Encounter (HOSPITAL_COMMUNITY): Payer: Self-pay

## 2024-07-12 DIAGNOSIS — Z7409 Other reduced mobility: Secondary | ICD-10-CM | POA: Insufficient documentation

## 2024-07-12 DIAGNOSIS — R262 Difficulty in walking, not elsewhere classified: Secondary | ICD-10-CM | POA: Diagnosis present

## 2024-07-12 DIAGNOSIS — G8929 Other chronic pain: Secondary | ICD-10-CM | POA: Diagnosis present

## 2024-07-12 DIAGNOSIS — M545 Low back pain, unspecified: Secondary | ICD-10-CM | POA: Insufficient documentation
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
## 2024-07-20 ENCOUNTER — Telehealth (HOSPITAL_COMMUNITY): Payer: Self-pay

## 2024-07-20 ENCOUNTER — Ambulatory Visit (HOSPITAL_COMMUNITY): Payer: MEDICAID

## 2024-07-20 NOTE — Telephone Encounter (Signed)
 Pt was called concerning his missed 1PM apointment this date. Left message with reminder for next appointment time and instructions to call back and reschedule if next appointment time does not work. This is no show #1.  Veera Stapleton, PT, DPT [facility] Office: [phone] 4:35 PM, 07/20/24

## 2024-07-26 ENCOUNTER — Encounter (HOSPITAL_COMMUNITY): Payer: Self-pay

## 2024-07-26 ENCOUNTER — Ambulatory Visit (HOSPITAL_COMMUNITY): Payer: MEDICAID

## 2024-07-26 DIAGNOSIS — G8929 Other chronic pain: Secondary | ICD-10-CM

## 2024-07-26 DIAGNOSIS — Z7409 Other reduced mobility: Secondary | ICD-10-CM

## 2024-07-26 DIAGNOSIS — R262 Difficulty in walking, not elsewhere classified: Secondary | ICD-10-CM

## 2024-07-26 DIAGNOSIS — M545 Low back pain, unspecified: Secondary | ICD-10-CM | POA: Diagnosis not present
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
## 2024-08-02 ENCOUNTER — Ambulatory Visit (HOSPITAL_COMMUNITY): Payer: MEDICAID

## 2024-08-02 ENCOUNTER — Encounter (HOSPITAL_COMMUNITY): Payer: Self-pay

## 2024-08-02 DIAGNOSIS — M545 Low back pain, unspecified: Secondary | ICD-10-CM | POA: Diagnosis not present

## 2024-08-02 DIAGNOSIS — Z7409 Other reduced mobility: Secondary | ICD-10-CM

## 2024-08-02 DIAGNOSIS — G8929 Other chronic pain: Secondary | ICD-10-CM

## 2024-08-02 DIAGNOSIS — R262 Difficulty in walking, not elsewhere classified: Secondary | ICD-10-CM
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
## 2024-08-09 ENCOUNTER — Encounter (HOSPITAL_COMMUNITY): Payer: MEDICAID

## 2024-08-10 ENCOUNTER — Ambulatory Visit: Payer: MEDICAID | Admitting: Gastroenterology

## 2024-08-10 ENCOUNTER — Other Ambulatory Visit: Payer: Self-pay | Admitting: *Deleted

## 2024-08-10 ENCOUNTER — Encounter: Payer: Self-pay | Admitting: Gastroenterology

## 2024-08-10 VITALS — BP 118/80 | HR 81 | Temp 98.2°F | Ht 70.0 in | Wt 217.8 lb

## 2024-08-10 DIAGNOSIS — K2101 Gastro-esophageal reflux disease with esophagitis, with bleeding: Secondary | ICD-10-CM

## 2024-08-10 DIAGNOSIS — K449 Diaphragmatic hernia without obstruction or gangrene: Secondary | ICD-10-CM

## 2024-08-10 DIAGNOSIS — R112 Nausea with vomiting, unspecified: Secondary | ICD-10-CM

## 2024-08-10 MED ORDER — FAMOTIDINE 20 MG PO TABS: ORAL_TABLET | ORAL | 11 refills | Status: AC

## 2024-08-10 MED ORDER — DEXLANSOPRAZOLE 60 MG PO CPDR: 60.0000 mg | DELAYED_RELEASE_CAPSULE | Freq: Two times a day (BID) | ORAL | 5 refills | Status: DC

## 2024-08-10 NOTE — Progress Notes (Signed)
 GI Office Note    Referring Provider: Shona Norleen PEDLAR, MD Primary Care Physician:  Shona Norleen PEDLAR, MD  Primary Gastroenterologist: Ozell Hollingshead, MD   Chief Complaint   Chief Complaint  Patient presents with   Follow-up    Follow up. Needs refill on dexilant  and pepcid . Hiatal hernia has been bother him, having pain and he feels like it is getting bigger.      History of Present Illness   Shawn [name] is a 53 y.o. male presenting today for follow up. Last seen in 03/2024. History of frequent N/V each morning, solid food dysphagia, gerd. Previous normal GES in 2017. Gallbladder remains in situ. EGD in 01/2019 with nonerosive reflux esophagitis and small hiatal hernia. Colonoscopy with single sessile serrated adenoma.   Presents today for follow up of colonoscopy and EGD completed in 05/2024 as outlined below.   Discussed the use of AI scribe software for clinical note transcription with the patient, who gave verbal consent to proceed.   He experiences persistent gastrointestinal symptoms, primarily burning in the gut, which worsens after meals and at night when lying down. Vomiting occurs four to five times a week, often in the early morning hours, sometimes waking him up around 4:30 to 5:00 AM and sometimes after waking up but before he takes his medications.  He states he gets bad heartburn that leads to vomiting. Really denies postprandial vomiting. Typically has vomiting only in the mornings.   He has a history of reflux esophagitis with bleeding on recent EGD and a large hiatal hernia (has increased in size substantially since his last EGD in 2020). Recently his Dexilant  was increased to 60 mg twice daily, but he is taking after meals in the morning and afternoon. He admits that he really did not read the instructions on the bottle. He takes the famotidine  once daily in the morning. The increase in Dexilant  dosage has not significantly alleviated his symptoms.  He has undergone  esophageal dilation, which has improved his swallowing. Bowel movements are regular, occurring sometimes twice a day, and he denies any significant abdominal pain aside from epigastric burning.   Wt Readings from Last 3 Encounters:  08/10/24 217 lb 12.8 oz (98.8 kg)  05/09/24 210 lb 15.7 oz (95.7 kg)  03/19/24 224 lb (101.6 kg)     Prior Data   Colonoscopy 05/2024: -diverticulosis -next colonoscopy in 5 years.   EGD 05/2024: -LA Grade C esophagitis with bleeding. S/p dilation. -large hiatal hernia (6-7 cm) -patulous EG junction, large hh, and florid reflux -plans to get reflux under control with medication and then consider surgical repair of hiatal hernia and fundoplication   Medications   Current Outpatient Medications  Medication Sig Dispense Refill   clonazePAM (KLONOPIN) 0.5 MG tablet Take 0.5 mg by mouth 2 (two) times daily.     Cyanocobalamin (B-12 PO) Take by mouth daily.     cyclobenzaprine  (FLEXERIL ) 10 MG tablet Take 10 mg by mouth daily.     dexlansoprazole  (DEXILANT ) 60 MG capsule Take 1 capsule (60 mg total) by mouth 2 (two) times daily before a meal. 60 capsule 3   famotidine  (PEPCID ) 20 MG tablet SMARTSIG:1 Tablet(s) By Mouth Every Evening     gabapentin  (NEURONTIN ) 300 MG capsule Take 300 mg by mouth 3 (three) times daily.     ibuprofen  (ADVIL ) 800 MG tablet Take 1 tablet (800 mg total) by mouth every 8 (eight) hours as needed. 30 tablet 0   MELATONIN PO  Take by mouth at bedtime.     meloxicam (MOBIC) 15 MG tablet Take 15 mg by mouth daily.     methocarbamol  (ROBAXIN ) 750 MG tablet Take 1 tablet (750 mg total) by mouth 4 (four) times daily. (Patient taking differently: Take 750 mg by mouth daily. As needed) 60 tablet 2   Multiple Vitamin (MULTIVITAMIN) tablet Take 1 tablet by mouth daily.     VRAYLAR 3 MG capsule Take 1 capsule by mouth daily.     No current facility-administered medications for this visit.    Allergies   Allergies as of 08/10/2024    (No Known Allergies)        Review of Systems   General: Negative for anorexia, weight loss, fever, chills, fatigue, weakness. ENT: Negative for hoarseness, difficulty swallowing , nasal congestion. CV: Negative for chest pain, angina, palpitations, dyspnea on exertion, peripheral edema.  Respiratory: Negative for dyspnea at rest, dyspnea on exertion, cough, sputum, wheezing.  GI: See history of present illness. GU:  Negative for dysuria, hematuria, urinary incontinence, urinary frequency, nocturnal urination.  Endo: Negative for unusual weight change.     Physical Exam   BP 118/80 (BP Location: Right Arm, Patient Position: Sitting, Cuff Size: Large)   Pulse 81   Temp 98.2 F (36.8 C) (Temporal)   Ht 5' 10 (1.778 m)   Wt 217 lb 12.8 oz (98.8 kg)   BMI 31.25 kg/m    General: Well-nourished, well-developed in no acute distress.  Eyes: No icterus. Mouth: Oropharyngeal mucosa moist and pink   Lungs: Clear to auscultation bilaterally.  Heart: Regular rate and rhythm, no murmurs rubs or gallops.  Abdomen: Bowel sounds are normal, nontender, nondistended, no hepatosplenomegaly or masses,  no abdominal bruits or hernia , no rebound or guarding.  Rectal: not performed Extremities: No lower extremity edema. No clubbing or deformities. Neuro: Alert and oriented x 4   Skin: Warm and dry, no jaundice.   Psych: Alert and cooperative, normal mood and affect.  Labs   Lab Results  Component Value Date   NA 140 03/21/2023   CL 105 03/21/2023   K 4.7 03/21/2023   CO2 28 03/21/2023   BUN 15 03/21/2023   CREATININE 1.07 03/21/2023   EGFR 83 03/21/2023   CALCIUM 9.6 03/21/2023   ALBUMIN 4.2 11/23/2018   GLUCOSE 91 03/21/2023   Lab Results  Component Value Date   WBC 7.3 05/07/2024   HGB 14.6 05/07/2024   HCT 44.4 05/07/2024   MCV 88.4 05/07/2024   PLT 286 05/07/2024    Imaging Studies   No results found.  Assessment/Plan:    LA Grade C reflux esophagitis with  bleeding and large hiatal hernia: Severe reflux esophagitis with inflammation and bleeding. Large hiatal hernia, increased in size since his 2020 EGD. He has not appreciated much improvement in his heartburn or vomiting since increasing his Dexilant  to BID although he is taking after meals and not taking famotidine  at night as intended. He would likely benefit from hiatal hernia repair and fundoplication as outlined by Dr. Ivonne recommendations. Discussed with patient, we would like to see his reflux better managed. He will need relook back to document healing of his esophagus at some point. Need to rule out esophageal motility disorder prior to consideration of anti-reflux surgery.  - Optimize Dexilant  timing: before breakfast and supper. - Famotidine  at bedtime for nocturnal symptoms. May take a second dose as needed for breakthrough symptoms - esophageal manometry in the near future - anti-reflux measures  -  EGD to verify healing of esophagus in 2-3 months, to determine timing.  - consider surgical referral for hiatal hernia repair and anti-reflux surgery/fundoplication if esophageal manometry unremarkable.     Sonny RAMAN. Ezzard, MHS, PA-C [facility]

## 2024-08-10 NOTE — Patient Instructions (Addendum)
 We are making a referral for an esophageal manometry to make sure your esophagus can handle hiatal hernia and reflux surgery. This is a test that involves placing a small tube through the nose and down into the esophagus while measuring pressures in your esophagus while you swallow 10-15 times. The test does not hurt but can be uncomfortable as the tube first passes through the nose. They will uses a numbing gel on the tube to help with the discomfort. The test does not last long and then the tube is removed. We need this information prior to sending you for possible surgery.   Continue Dexilant  60mg  take 30 minutes before breakfast and 30 minutes before supper.   Take famotidine  20mg  at bedtime. You can take one additional time throughout the day if you have heartburn/indigestion.   Please call in 2 weeks if you have not heard from [facility] to schedule your esophageal manometry.   Please call if your reflux symptoms do not improve with medication changes. If symptoms are not better, we can try a new medication.

## 2024-08-14 ENCOUNTER — Telehealth: Payer: Self-pay | Admitting: *Deleted

## 2024-08-14 NOTE — Telephone Encounter (Signed)
 Pt called to check on referral to have esophageal manometry. Advised pt that referral was sent on 10/3, to give it at least a week or two. They should be contacting him to set up appt. Pt verbalized understanding.

## 2024-08-16 ENCOUNTER — Encounter (HOSPITAL_COMMUNITY): Payer: Self-pay

## 2024-08-16 ENCOUNTER — Telehealth: Payer: Self-pay | Admitting: Gastroenterology

## 2024-08-16 ENCOUNTER — Other Ambulatory Visit: Payer: Self-pay

## 2024-08-16 ENCOUNTER — Ambulatory Visit (HOSPITAL_COMMUNITY): Payer: MEDICAID | Attending: Orthopedic Surgery

## 2024-08-16 DIAGNOSIS — G8929 Other chronic pain: Secondary | ICD-10-CM | POA: Insufficient documentation

## 2024-08-16 DIAGNOSIS — Z7409 Other reduced mobility: Secondary | ICD-10-CM | POA: Diagnosis present

## 2024-08-16 DIAGNOSIS — K2101 Gastro-esophageal reflux disease with esophagitis, with bleeding: Secondary | ICD-10-CM

## 2024-08-16 DIAGNOSIS — M545 Low back pain, unspecified: Secondary | ICD-10-CM | POA: Diagnosis present

## 2024-08-16 DIAGNOSIS — K449 Diaphragmatic hernia without obstruction or gangrene: Secondary | ICD-10-CM

## 2024-08-16 DIAGNOSIS — R262 Difficulty in walking, not elsewhere classified: Secondary | ICD-10-CM | POA: Diagnosis present

## 2024-08-16 NOTE — Telephone Encounter (Signed)
 Patient contacted and scheduled for 08/22/24.
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
## 2024-08-16 NOTE — Telephone Encounter (Signed)
 Shawn [name],   This patient called stating that [facility] had sent a referral over to us  for him for Esophageal manometry. Referral is in epic. Will you please review.    Thank you!

## 2024-08-22 ENCOUNTER — Encounter (HOSPITAL_COMMUNITY)
Admission: RE | Disposition: A | Discharge status: Home / Self Care | Payer: Self-pay | Source: Home / Self Care | Attending: Gastroenterology

## 2024-08-22 ENCOUNTER — Ambulatory Visit (HOSPITAL_COMMUNITY)
Admission: RE | Admit: 2024-08-22 | Discharge: 2024-08-22 | Disposition: A | Discharge status: Home / Self Care | Payer: MEDICAID | Attending: Gastroenterology | Admitting: Gastroenterology

## 2024-08-22 DIAGNOSIS — K2100 Gastro-esophageal reflux disease with esophagitis, without bleeding: Secondary | ICD-10-CM | POA: Diagnosis present

## 2024-08-22 DIAGNOSIS — K449 Diaphragmatic hernia without obstruction or gangrene: Secondary | ICD-10-CM | POA: Insufficient documentation

## 2024-08-22 DIAGNOSIS — R1310 Dysphagia, unspecified: Secondary | ICD-10-CM | POA: Insufficient documentation

## 2024-08-22 HISTORY — PX: ESOPHAGEAL MANOMETRY: SHX5429

## 2024-08-22 SURGERY — MANOMETRY, ESOPHAGUS
Anesthesia: Choice

## 2024-08-22 MED ORDER — LIDOCAINE VISCOUS HCL 2 % MT SOLN
OROMUCOSAL | Status: AC
  Filled 2024-08-22: qty 15

## 2024-08-22 SURGICAL SUPPLY — 2 items
FACESHIELD LNG OPTICON STERILE (SAFETY) IMPLANT
GLOVE BIO SURGEON STRL SZ8 (GLOVE) ×4 IMPLANT

## 2024-08-22 NOTE — Progress Notes (Signed)
 Esophageal Manometry done per protocol. Patient tolerated well without distress or complication.

## 2024-08-23 ENCOUNTER — Encounter (HOSPITAL_COMMUNITY): Payer: Self-pay | Admitting: Gastroenterology

## 2024-08-23 ENCOUNTER — Ambulatory Visit (HOSPITAL_COMMUNITY): Payer: MEDICAID

## 2024-08-23 ENCOUNTER — Telehealth: Payer: Self-pay | Admitting: *Deleted

## 2024-08-23 DIAGNOSIS — Z7409 Other reduced mobility: Secondary | ICD-10-CM

## 2024-08-23 DIAGNOSIS — R262 Difficulty in walking, not elsewhere classified: Secondary | ICD-10-CM

## 2024-08-23 DIAGNOSIS — M545 Low back pain, unspecified: Secondary | ICD-10-CM | POA: Diagnosis not present

## 2024-08-23 DIAGNOSIS — R112 Nausea with vomiting, unspecified: Secondary | ICD-10-CM

## 2024-08-23 DIAGNOSIS — G8929 Other chronic pain: Secondary | ICD-10-CM

## 2024-08-23 DIAGNOSIS — K2101 Gastro-esophageal reflux disease with esophagitis, with bleeding: Secondary | ICD-10-CM

## 2024-08-23 NOTE — Telephone Encounter (Signed)
 Pt is calling back in regards to being referred to have surgery for his hiatal hernia. Please advise. Thank you

## 2024-08-23 NOTE — Telephone Encounter (Signed)
 Shawn [name], please let patient know we have not received results from his manometry that he completed YESTERDAY. When we receive results and review them, we will arrange for referral assuming his manometry is unremarkable.
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
## 2024-08-24 NOTE — Telephone Encounter (Signed)
Pt informed of providers message and recommendations. Verbalized understanding

## 2024-08-30 ENCOUNTER — Encounter (HOSPITAL_COMMUNITY): Payer: MEDICAID | Admitting: Physical Therapy

## 2024-08-30 ENCOUNTER — Telehealth (HOSPITAL_COMMUNITY): Payer: Self-pay | Admitting: Physical Therapy

## 2024-08-30 ENCOUNTER — Encounter (INDEPENDENT_AMBULATORY_CARE_PROVIDER_SITE_OTHER): Payer: Self-pay

## 2024-08-30 ENCOUNTER — Encounter (HOSPITAL_COMMUNITY): Payer: Self-pay

## 2024-08-30 MED ORDER — VOQUEZNA 20 MG PO TABS: 20.0000 mg | ORAL_TABLET | Freq: Every day | ORAL | 2 refills | Status: DC

## 2024-08-30 NOTE — Addendum Note (Signed)
 Addended by: EZZARD SONNY RAMAN on: 08/30/2024 01:58 PM   Modules accepted: Orders

## 2024-08-30 NOTE — Telephone Encounter (Signed)
 Pt was made aware and verbalized understanding. Pt states that his heartburn is not any better and would like to try the Voquezna. Pt is also ok with ENT referral.

## 2024-08-30 NOTE — Telephone Encounter (Signed)
 Please make sure Esophageal Manometry report goes with his referral to ENT. I want to make sure Dr. Karis is aware of the abnormality seen on this study.

## 2024-08-30 NOTE — Telephone Encounter (Signed)
 Pt did not show for appt.  Today was slated as discharge visit; no further appt scheduled at this time and unable to reach, VM.  Greig KATHEE Fuse, PTA/CLT [facility] Ph: [phone]

## 2024-08-30 NOTE — Addendum Note (Signed)
 Addended by: JEANELL GRAEME RAMAN on: 08/30/2024 08:26 AM   Modules accepted: Orders

## 2024-08-30 NOTE — Telephone Encounter (Signed)
 Referral sent to Dr. Karis in [place]

## 2024-08-30 NOTE — Telephone Encounter (Signed)
 Please ask patient if his heartburn has improved since he started taking Dexilant  before breakfast and supper.  His manometry shows that the muscle at the upper esophagus does not completely relax. It has been recommended by Dr.Nandigam (GI reading his manometry) that he be evaluated by ENT to rule out cricopharyngeal achalasia. This will need to be done prior to considering hiatal hernia repair/anti reflux surgery.  We will still need to get egd in future to verify esophageal healing.   Please refer to ENT to rule out cricopharyngeal achalasia Let me know if heartburn better. If not, then we can try voquenza.   FYI Dr. Shaaron.

## 2024-08-30 NOTE — Telephone Encounter (Signed)
 Voquezna 20mg  daily before breakfast. Sent in #30, 2 refills. We will reassess for dosing depending on how he dose, would try to decrease to 10mg  if tolerated in the future.   Stop dexilant .  Return ov in 6 weeks with Dr. Shaaron.

## 2024-09-03 NOTE — Telephone Encounter (Signed)
 It is epic referral so they will have access

## 2024-10-02 ENCOUNTER — Encounter: Payer: Self-pay | Admitting: Internal Medicine

## 2024-10-12 ENCOUNTER — Institutional Professional Consult (permissible substitution) (INDEPENDENT_AMBULATORY_CARE_PROVIDER_SITE_OTHER): Payer: MEDICAID

## 2024-11-05 ENCOUNTER — Institutional Professional Consult (permissible substitution) (INDEPENDENT_AMBULATORY_CARE_PROVIDER_SITE_OTHER): Payer: MEDICAID

## 2024-11-05 ENCOUNTER — Telehealth (INDEPENDENT_AMBULATORY_CARE_PROVIDER_SITE_OTHER): Payer: Self-pay

## 2024-11-05 NOTE — Telephone Encounter (Signed)
 Left message for patient to return call to reschedule appointent with Dr. Masciello

## 2024-11-13 ENCOUNTER — Ambulatory Visit: Payer: MEDICAID | Admitting: Internal Medicine

## 2024-11-24 ENCOUNTER — Other Ambulatory Visit: Payer: Self-pay | Admitting: Gastroenterology

## 2024-11-26 NOTE — Telephone Encounter (Signed)
 Pt was made aware and verbalized understanding.

## 2024-11-26 NOTE — Telephone Encounter (Signed)
 Patient needs to keep his ENT appt, multiple missed appts.   Please have him reschedule ov with Dr. Shaaron AFTER he seens ENT.   I sent in refill of voquezna 

## 2024-11-29 ENCOUNTER — Encounter (INDEPENDENT_AMBULATORY_CARE_PROVIDER_SITE_OTHER): Payer: Self-pay

## 2024-11-29 ENCOUNTER — Ambulatory Visit (INDEPENDENT_AMBULATORY_CARE_PROVIDER_SITE_OTHER): Payer: MEDICAID

## 2024-11-29 ENCOUNTER — Other Ambulatory Visit (HOSPITAL_COMMUNITY): Payer: Self-pay | Admitting: *Deleted

## 2024-11-29 VITALS — BP 119/87 | HR 94 | Ht 70.0 in | Wt 215.0 lb

## 2024-11-29 DIAGNOSIS — R1313 Dysphagia, pharyngeal phase: Secondary | ICD-10-CM

## 2024-11-29 DIAGNOSIS — R1312 Dysphagia, oropharyngeal phase: Secondary | ICD-10-CM

## 2024-11-29 DIAGNOSIS — R1310 Dysphagia, unspecified: Secondary | ICD-10-CM

## 2024-11-29 NOTE — Progress Notes (Signed)
 Dear Dr. Ezzard, Here is my assessment for our mutual patient, Shawn [name]. Thank you for allowing me the opportunity to care for your patient. Please do not hesitate to contact me should you have any other questions. Sincerely, Dr. Hadassah Parody  Otolaryngology Clinic Note Referring provider: Dr. Ezzard HPI:   Initial HPI (11/29/2024)  54 year old male with dysphagia here for the evaluation of possible cricopharyngeal bar at the request of gastroenterology.  Over the past year or so he has had frequent nausea and vomiting solid food dysphagia and GERD.  He has some regurgitation of food.  No weight loss.  Symptoms of gotten somewhat worse.  He underwent esophageal manometry with GI on 08/29/2024.  This showed evidence of a hiatal hernia as well as incomplete relaxation of the UES.  He has not had a modified barium swallow.  He is on Pepcid  20 mg nightly.   Independent Review of Additional Tests or Records:  08/29/2024 esophageal manometry reviewed; showed incomplete relaxation of the upper esophageal sphincter.  Refer to ENT for evaluation of possible CP bar  CBC 05/07/2024 hemoglobin 14.6 PMH/Meds/All/SocHx/FamHx/ROS:   Past Medical History:  Diagnosis Date   Anemia    Arthritis    Back pain    Bipolar 1 disorder (HCC)    Bipolar disorder (HCC)    Blood clots in brain    2005--  due to head injury with steel plate placed   COPD (chronic obstructive pulmonary disease) (HCC)    Diverticulitis    Erosive esophagitis    Family history of adverse reaction to anesthesia    he was adopted   GERD (gastroesophageal reflux disease)    Heart disease    irregular heart beat   Heart murmur    Hiatal hernia    HOH (hard of hearing)    biological parents are both deaf.   left ear is good.   Hyperlipidemia    Hypertension    IgG4 deficiency (HCC)    Lung nodules    Schizoaffective disorder, bipolar type (HCC)    Vomiting      Past Surgical History:  Procedure Laterality  Date   BRAIN SURGERY  2005   after head injury, patient states had 2 large blood clots evacuated   COLONOSCOPY N/A 05/09/2024   Procedure: COLONOSCOPY;  Surgeon: Shaaron Lamar HERO, MD;  Location: [facility];  Service: Endoscopy;  Laterality: N/A;  10:45 am, asa 3   COLONOSCOPY WITH ESOPHAGOGASTRODUODENOSCOPY (EGD) N/A 12/05/2013   MFM:fpoi erosive relfux/HH/melanosis coli/colonic diverticulosis. tubulovillous adenoma removed. next tcs 11/2016   COLONOSCOPY WITH PROPOFOL  N/A 01/19/2017   Dr. Shaaron: Diverticulosis, single 8 mm sessile serrated adenoma without dysplasia removed from the ascending colon, internal.  Next colonoscopy recommended in March 2023.   ESOPHAGEAL DILATION N/A 05/09/2024   Procedure: DILATION, ESOPHAGUS;  Surgeon: Shaaron Lamar HERO, MD;  Location: [facility];  Service: Endoscopy;  Laterality: N/A;   ESOPHAGEAL MANOMETRY N/A 08/22/2024   Procedure: MANOMETRY, ESOPHAGUS;  Surgeon: Shila Gustav GAILS, MD;  Location: [facility];  Service: Gastroenterology;  Laterality: N/A;   ESOPHAGOGASTRODUODENOSCOPY N/A 05/09/2024   Procedure: EGD (ESOPHAGOGASTRODUODENOSCOPY);  Surgeon: Shaaron Lamar HERO, MD;  Location: [facility];  Service: Endoscopy;  Laterality: N/A;   ESOPHAGOGASTRODUODENOSCOPY (EGD) WITH PROPOFOL  N/A 02/05/2019   Dr. Shaaron: Small hiatal hernia, mild erosive reflux esophagitis.  Esophagus dilated due to history of dysphagia.   FLEXIBLE BRONCHOSCOPY N/A 01/23/2016   Procedure: FLEXIBLE BRONCHOSCOPY;  Surgeon: Dorise MARLA Fellers, MD;  Location: [facility];  Service: Thoracic;  Laterality: N/A;   KNEE SURGERY Right 09/01/2022   Patient states he has pins in his knee and a rod that goes up to his hip.   LUNG BIOPSY N/A 01/23/2016   Procedure: LEFT LUNG BIOPSY;  Surgeon: Dorise MARLA Fellers, MD;  Location: [facility];  Service: Thoracic;  Laterality: N/A;   MALONEY DILATION N/A 02/05/2019   Procedure: AGAPITO DILATION;  Surgeon: Shaaron Lamar HERO, MD;  Location: [facility];  Service:  Endoscopy;  Laterality: N/A;   POLYPECTOMY  01/19/2017   Procedure: POLYPECTOMY;  Surgeon: Lamar HERO Shaaron, MD;  Location: [facility];  Service: Endoscopy;;  ascending colon polyp   VIDEO ASSISTED THORACOSCOPY Left 01/23/2016   Procedure: LEFT VIDEO ASSISTED THORACOSCOPY;  Surgeon: Dorise MARLA Fellers, MD;  Location: [facility];  Service: Thoracic;  Laterality: Left;    Family History  Adopted: Yes  Problem Relation Age of Onset   Alzheimer's disease Other    Parkinson's disease Other    Mental illness Other    Colon cancer Neg Hx        adopted at 65 months old, unsure about GI history of parents      Social Connections: Not on file     Current Outpatient Medications  Medication Instructions   clonazePAM (KLONOPIN) 0.5 mg, 2 times daily   Cyanocobalamin (B-12 PO) Daily   cyclobenzaprine  (FLEXERIL ) 10 mg, Daily   famotidine  (PEPCID ) 20 MG tablet Take one dose at bedtime every night, you may take a second dose as needed for heartburn or indigestion.   gabapentin  (NEURONTIN ) 300 mg, 3 times daily   ibuprofen  (ADVIL ) 800 mg, Oral, Every 8 hours PRN   MELATONIN PO Daily at bedtime   meloxicam (MOBIC) 15 mg, Daily   methocarbamol  (ROBAXIN ) 750 mg, Oral, 4 times daily   Multiple Vitamin (MULTIVITAMIN) tablet 1 tablet, Daily   Vonoprazan Fumarate  (VOQUEZNA ) 20 MG TABS 1 tablet, Oral, Daily before breakfast   VRAYLAR 3 MG capsule 1 capsule, Daily     Physical Exam:   BP 119/87 (BP Location: Left Arm, Patient Position: Sitting)   Pulse 94   Ht 5' 10 (1.778 m)   Wt 215 lb (97.5 kg)   SpO2 92%   BMI 30.85 kg/m   Salient findings:  CN II-XII intact    Bilateral EAC clear and TM intact with well pneumatized middle ear spaces  Anterior rhinoscopy: Septum midline; bilateral inferior turbinates with hypertrophy  No lesions of oral cavity/oropharynx  No obviously palpable neck masses/lymphadenopathy/thyromegaly  No respiratory distress or stridor TFL was indicated to better evaluate  the proximal airway, given the patient's history and exam findings, and is detailed below.  Seprately Identifiable Procedures:  Prior to initiating any procedures, risks/benefits/alternatives were explained to the patient and verbal consent obtained.  Procedure Note (11/29/2024) Pre-procedure diagnosis: oropharyngeal dysphagia Post-procedure diagnosis: Same Procedure: Transnasal Fiberoptic Laryngoscopy, CPT 31575 - Mod 25 Indication: Oropharyngeal dysphagia Complications: None apparent EBL: 0 mL  The procedure was undertaken to further evaluate the patient's complaint of oropharyngeal dysphagia, with mirror exam inadequate for appropriate examination due to gag reflex and poor patient tolerance  Procedure:  Patient was identified as correct patient. Verbal consent was obtained. The nose was sprayed with oxymetazoline and 4% lidocaine . The The flexible laryngoscope was passed through the nose to view the nasal cavity, pharynx (oropharynx, hypopharynx) and larynx.  The larynx was examined at rest and during multiple phonatory tasks. Documentation was obtained and reviewed with patient. The scope  was removed. The patient tolerated the procedure well.  Findings: The nasal cavity and nasopharynx did not reveal any masses or lesions, mucosa appeared to be without obvious lesions. The tongue base, pharyngeal walls, piriform sinuses, vallecula, epiglottis and postcricoid region are normal in appearance.  There is some postcricoid edema.  The visualized portion of the subglottis and proximal trachea is widely patent. The vocal folds are mobile bilaterally. There are no lesions on the free edge of the vocal folds nor elsewhere in the larynx worrisome for malignancy.    Electronically signed by: Hadassah JAYSON Parody, MD 11/29/2024 8:42 PM   Impression & Plans:  Vicente Weidler is a 54 y.o. male with     ICD-10-CM   1. 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 Oropharyngeal  dysphagia  R13.12      Assessment and Plan Assessment & Plan Cricopharyngeal dysphagia Oropharyngeal dysphagia Chronic dysphagia with intermittent regurgitation of solids and absence of weight loss. Esophageal manometry revealed elevated upper esophageal sphincter pressure, suggestive of cricopharyngeal dysfunction. Flexible nasopharyngoscopy demonstrated mild postcricoid edema consistent with reflux, without evidence of obstructive lesions. Further evaluation is required to delineate the role of cricopharyngeal muscle dysfunction in his symptoms. - Ordered modified barium swallow study to evaluate swallowing function and cricopharyngeal muscle involvement. - Recommended follow-up in 6-8 weeks to review swallow study results and determine subsequent management.    See below regarding exact medications prescribed this encounter including dosages and route: No orders of the defined types were placed in this encounter.     Thank you for allowing me the opportunity to care for your patient. Please do not hesitate to contact me should you have any other questions.  Sincerely, Hadassah Parody, MD Otolaryngologist (ENT), [facility] Phone: [phone] Fax: [phone]  MDM:  Level 4 Complexity/Problems addressed: 4-chronic worsening problem Data complexity: 4-  independent review of referral note, lab, ordered MBS - Morbidity: -   - Prescription Drug prescribed or managed:

## 2024-12-11 ENCOUNTER — Encounter: Payer: Self-pay | Admitting: Internal Medicine

## 2024-12-11 ENCOUNTER — Ambulatory Visit: Payer: MEDICAID | Admitting: Internal Medicine

## 2024-12-11 VITALS — BP 134/89 | HR 93 | Temp 98.4°F | Ht 70.0 in | Wt 227.6 lb

## 2024-12-11 DIAGNOSIS — K2100 Gastro-esophageal reflux disease with esophagitis, without bleeding: Secondary | ICD-10-CM

## 2024-12-11 DIAGNOSIS — K2101 Gastro-esophageal reflux disease with esophagitis, with bleeding: Secondary | ICD-10-CM

## 2024-12-11 DIAGNOSIS — K449 Diaphragmatic hernia without obstruction or gangrene: Secondary | ICD-10-CM

## 2024-12-11 NOTE — Patient Instructions (Signed)
"   it was nice to see you again today.  Things look good at this point for being referred for hiatal hernia repair  ENT would like you to have a swallowing study.  Please follow through on the study.  Continue taking Dexilant  60 mg twice daily before breakfast and supper  Office visit with us  in 6 weeks "

## 2024-12-17 ENCOUNTER — Other Ambulatory Visit: Payer: MEDICAID

## 2024-12-25 ENCOUNTER — Ambulatory Visit: Payer: MEDICAID | Admitting: Urology

## 2024-12-27 ENCOUNTER — Encounter (HOSPITAL_COMMUNITY): Payer: MEDICAID

## 2025-01-14 ENCOUNTER — Ambulatory Visit (INDEPENDENT_AMBULATORY_CARE_PROVIDER_SITE_OTHER): Payer: MEDICAID
